# Patient Record
Sex: Female | Born: 1945 | Race: White | Hispanic: No | Smoking: Former smoker
Health system: Southern US, Community
[De-identification: ages and names within clinical notes are randomized; demographics above are authoritative.]

## PROBLEM LIST (undated history)

## (undated) DIAGNOSIS — I10 Essential (primary) hypertension: Secondary | ICD-10-CM

## (undated) DIAGNOSIS — Z972 Presence of dental prosthetic device (complete) (partial): Secondary | ICD-10-CM

## (undated) DIAGNOSIS — Z9989 Dependence on other enabling machines and devices: Secondary | ICD-10-CM

## (undated) DIAGNOSIS — R591 Generalized enlarged lymph nodes: Secondary | ICD-10-CM

## (undated) DIAGNOSIS — Z87442 Personal history of urinary calculi: Secondary | ICD-10-CM

## (undated) DIAGNOSIS — E890 Postprocedural hypothyroidism: Secondary | ICD-10-CM

## (undated) DIAGNOSIS — I2510 Atherosclerotic heart disease of native coronary artery without angina pectoris: Secondary | ICD-10-CM

## (undated) DIAGNOSIS — Z8619 Personal history of other infectious and parasitic diseases: Secondary | ICD-10-CM

## (undated) DIAGNOSIS — Z9621 Cochlear implant status: Secondary | ICD-10-CM

## (undated) DIAGNOSIS — H409 Unspecified glaucoma: Secondary | ICD-10-CM

## (undated) DIAGNOSIS — H269 Unspecified cataract: Secondary | ICD-10-CM

## (undated) DIAGNOSIS — M5137 Other intervertebral disc degeneration, lumbosacral region: Secondary | ICD-10-CM

## (undated) DIAGNOSIS — R159 Full incontinence of feces: Secondary | ICD-10-CM

## (undated) DIAGNOSIS — E782 Mixed hyperlipidemia: Secondary | ICD-10-CM

## (undated) DIAGNOSIS — R911 Solitary pulmonary nodule: Secondary | ICD-10-CM

## (undated) DIAGNOSIS — H903 Sensorineural hearing loss, bilateral: Secondary | ICD-10-CM

## (undated) DIAGNOSIS — K219 Gastro-esophageal reflux disease without esophagitis: Secondary | ICD-10-CM

## (undated) DIAGNOSIS — H8110 Benign paroxysmal vertigo, unspecified ear: Secondary | ICD-10-CM

## (undated) DIAGNOSIS — I7121 Aneurysm of the ascending aorta, without rupture: Secondary | ICD-10-CM

## (undated) DIAGNOSIS — E785 Hyperlipidemia, unspecified: Secondary | ICD-10-CM

## (undated) DIAGNOSIS — K760 Fatty (change of) liver, not elsewhere classified: Secondary | ICD-10-CM

## (undated) DIAGNOSIS — H8109 Meniere's disease, unspecified ear: Secondary | ICD-10-CM

## (undated) DIAGNOSIS — Z973 Presence of spectacles and contact lenses: Secondary | ICD-10-CM

## (undated) DIAGNOSIS — N329 Bladder disorder, unspecified: Secondary | ICD-10-CM

## (undated) DIAGNOSIS — J439 Emphysema, unspecified: Secondary | ICD-10-CM

## (undated) DIAGNOSIS — A048 Other specified bacterial intestinal infections: Secondary | ICD-10-CM

## (undated) DIAGNOSIS — K921 Melena: Secondary | ICD-10-CM

## (undated) DIAGNOSIS — R32 Unspecified urinary incontinence: Secondary | ICD-10-CM

## (undated) DIAGNOSIS — G4730 Sleep apnea, unspecified: Secondary | ICD-10-CM

## (undated) DIAGNOSIS — I712 Thoracic aortic aneurysm, without rupture: Secondary | ICD-10-CM

## (undated) DIAGNOSIS — Z86010 Personal history of colonic polyps: Secondary | ICD-10-CM

## (undated) DIAGNOSIS — Z860101 Personal history of adenomatous and serrated colon polyps: Secondary | ICD-10-CM

## (undated) DIAGNOSIS — K449 Diaphragmatic hernia without obstruction or gangrene: Secondary | ICD-10-CM

## (undated) DIAGNOSIS — N3281 Overactive bladder: Secondary | ICD-10-CM

## (undated) DIAGNOSIS — Z8719 Personal history of other diseases of the digestive system: Secondary | ICD-10-CM

## (undated) DIAGNOSIS — G4733 Obstructive sleep apnea (adult) (pediatric): Secondary | ICD-10-CM

## (undated) DIAGNOSIS — M51379 Other intervertebral disc degeneration, lumbosacral region without mention of lumbar back pain or lower extremity pain: Secondary | ICD-10-CM

## (undated) DIAGNOSIS — R0602 Shortness of breath: Secondary | ICD-10-CM

## (undated) DIAGNOSIS — N200 Calculus of kidney: Secondary | ICD-10-CM

## (undated) DIAGNOSIS — A6000 Herpesviral infection of urogenital system, unspecified: Secondary | ICD-10-CM

## (undated) DIAGNOSIS — K5792 Diverticulitis of intestine, part unspecified, without perforation or abscess without bleeding: Secondary | ICD-10-CM

## (undated) DIAGNOSIS — E039 Hypothyroidism, unspecified: Secondary | ICD-10-CM

## (undated) DIAGNOSIS — T7840XA Allergy, unspecified, initial encounter: Secondary | ICD-10-CM

## (undated) HISTORY — DX: Dependence on other enabling machines and devices: Z99.89

## (undated) HISTORY — DX: Unspecified glaucoma: H40.9

## (undated) HISTORY — DX: Other specified bacterial intestinal infections: A04.8

## (undated) HISTORY — DX: Gastro-esophageal reflux disease without esophagitis: K21.9

## (undated) HISTORY — PX: CATARACT EXTRACTION W/ INTRAOCULAR LENS IMPLANT: SHX1309

## (undated) HISTORY — DX: Thoracic aortic aneurysm, without rupture: I71.2

## (undated) HISTORY — PX: COCHLEAR IMPLANT: SUR684

## (undated) HISTORY — DX: Essential (primary) hypertension: I10

## (undated) HISTORY — DX: Fatty (change of) liver, not elsewhere classified: K76.0

## (undated) HISTORY — DX: Herpesviral infection of urogenital system, unspecified: A60.00

## (undated) HISTORY — DX: Hypothyroidism, unspecified: E03.9

## (undated) HISTORY — DX: Obstructive sleep apnea (adult) (pediatric): G47.33

## (undated) HISTORY — DX: Benign paroxysmal vertigo, unspecified ear: H81.10

## (undated) HISTORY — DX: Unspecified cataract: H26.9

## (undated) HISTORY — DX: Sleep apnea, unspecified: G47.30

## (undated) HISTORY — DX: Diverticulitis of intestine, part unspecified, without perforation or abscess without bleeding: K57.92

## (undated) HISTORY — PX: CATARACT EXTRACTION, BILATERAL: SHX1313

## (undated) HISTORY — PX: WISDOM TOOTH EXTRACTION: SHX21

## (undated) HISTORY — DX: Aneurysm of the ascending aorta, without rupture: I71.21

## (undated) HISTORY — DX: Meniere's disease, unspecified ear: H81.09

## (undated) HISTORY — DX: Unspecified urinary incontinence: R32

## (undated) HISTORY — DX: Melena: K92.1

## (undated) HISTORY — DX: Diaphragmatic hernia without obstruction or gangrene: K44.9

## (undated) HISTORY — PX: OTHER SURGICAL HISTORY: SHX169

## (undated) HISTORY — DX: Allergy, unspecified, initial encounter: T78.40XA

## (undated) HISTORY — DX: Solitary pulmonary nodule: R91.1

## (undated) HISTORY — DX: Hyperlipidemia, unspecified: E78.5

## (undated) HISTORY — DX: Calculus of kidney: N20.0

---

## 1987-12-30 HISTORY — PX: BREAST SURGERY: SHX581

## 1987-12-30 HISTORY — PX: REDUCTION MAMMAPLASTY: SUR839

## 1987-12-30 HISTORY — PX: APPENDECTOMY: SHX54

## 1987-12-30 HISTORY — PX: CHOLECYSTECTOMY: SHX55

## 1987-12-30 HISTORY — PX: CHOLECYSTECTOMY OPEN: SUR202

## 1993-12-29 HISTORY — PX: TOTAL THYROIDECTOMY: SHX2547

## 1993-12-29 HISTORY — PX: ABDOMINAL HYSTERECTOMY: SHX81

## 2010-05-28 DIAGNOSIS — K449 Diaphragmatic hernia without obstruction or gangrene: Secondary | ICD-10-CM

## 2010-05-28 DIAGNOSIS — K760 Fatty (change of) liver, not elsewhere classified: Secondary | ICD-10-CM

## 2010-05-28 HISTORY — DX: Diaphragmatic hernia without obstruction or gangrene: K44.9

## 2010-05-28 HISTORY — DX: Fatty (change of) liver, not elsewhere classified: K76.0

## 2010-12-29 HISTORY — PX: LAPAROSCOPIC SIGMOID COLECTOMY: SHX5928

## 2010-12-29 HISTORY — PX: COLONOSCOPY: SHX174

## 2011-02-27 HISTORY — PX: LAPAROSCOPIC SIGMOID COLECTOMY: SHX5928

## 2012-04-19 DIAGNOSIS — I079 Rheumatic tricuspid valve disease, unspecified: Secondary | ICD-10-CM | POA: Diagnosis not present

## 2012-04-19 DIAGNOSIS — I672 Cerebral atherosclerosis: Secondary | ICD-10-CM | POA: Diagnosis not present

## 2012-04-19 DIAGNOSIS — I051 Rheumatic mitral insufficiency: Secondary | ICD-10-CM | POA: Diagnosis not present

## 2012-04-19 DIAGNOSIS — I10 Essential (primary) hypertension: Secondary | ICD-10-CM | POA: Diagnosis not present

## 2012-04-19 DIAGNOSIS — E782 Mixed hyperlipidemia: Secondary | ICD-10-CM | POA: Diagnosis not present

## 2012-04-19 DIAGNOSIS — E039 Hypothyroidism, unspecified: Secondary | ICD-10-CM | POA: Diagnosis not present

## 2012-04-19 DIAGNOSIS — H8110 Benign paroxysmal vertigo, unspecified ear: Secondary | ICD-10-CM | POA: Diagnosis not present

## 2012-04-19 DIAGNOSIS — R9431 Abnormal electrocardiogram [ECG] [EKG]: Secondary | ICD-10-CM | POA: Diagnosis not present

## 2012-04-19 DIAGNOSIS — R42 Dizziness and giddiness: Secondary | ICD-10-CM | POA: Diagnosis not present

## 2012-04-26 DIAGNOSIS — R319 Hematuria, unspecified: Secondary | ICD-10-CM | POA: Diagnosis not present

## 2012-04-26 DIAGNOSIS — E7800 Pure hypercholesterolemia, unspecified: Secondary | ICD-10-CM | POA: Diagnosis not present

## 2012-04-26 DIAGNOSIS — R9431 Abnormal electrocardiogram [ECG] [EKG]: Secondary | ICD-10-CM | POA: Diagnosis not present

## 2012-04-26 DIAGNOSIS — I10 Essential (primary) hypertension: Secondary | ICD-10-CM | POA: Diagnosis not present

## 2012-04-26 DIAGNOSIS — I051 Rheumatic mitral insufficiency: Secondary | ICD-10-CM | POA: Diagnosis not present

## 2012-04-26 DIAGNOSIS — E039 Hypothyroidism, unspecified: Secondary | ICD-10-CM | POA: Diagnosis not present

## 2012-04-26 DIAGNOSIS — F17200 Nicotine dependence, unspecified, uncomplicated: Secondary | ICD-10-CM | POA: Diagnosis not present

## 2012-04-26 DIAGNOSIS — E782 Mixed hyperlipidemia: Secondary | ICD-10-CM | POA: Diagnosis not present

## 2012-04-28 DIAGNOSIS — I10 Essential (primary) hypertension: Secondary | ICD-10-CM | POA: Diagnosis not present

## 2012-04-28 DIAGNOSIS — E7800 Pure hypercholesterolemia, unspecified: Secondary | ICD-10-CM | POA: Diagnosis not present

## 2012-05-06 DIAGNOSIS — Z9689 Presence of other specified functional implants: Secondary | ICD-10-CM | POA: Diagnosis not present

## 2012-05-06 DIAGNOSIS — Z9089 Acquired absence of other organs: Secondary | ICD-10-CM | POA: Diagnosis not present

## 2012-05-06 DIAGNOSIS — Z9889 Other specified postprocedural states: Secondary | ICD-10-CM | POA: Diagnosis not present

## 2012-05-06 DIAGNOSIS — I10 Essential (primary) hypertension: Secondary | ICD-10-CM | POA: Diagnosis not present

## 2012-05-06 DIAGNOSIS — F17200 Nicotine dependence, unspecified, uncomplicated: Secondary | ICD-10-CM | POA: Diagnosis not present

## 2012-05-06 DIAGNOSIS — Z8249 Family history of ischemic heart disease and other diseases of the circulatory system: Secondary | ICD-10-CM | POA: Diagnosis not present

## 2012-05-06 DIAGNOSIS — R42 Dizziness and giddiness: Secondary | ICD-10-CM | POA: Diagnosis not present

## 2012-05-06 DIAGNOSIS — R112 Nausea with vomiting, unspecified: Secondary | ICD-10-CM | POA: Diagnosis not present

## 2012-05-06 DIAGNOSIS — H9209 Otalgia, unspecified ear: Secondary | ICD-10-CM | POA: Diagnosis not present

## 2012-05-10 DIAGNOSIS — H8109 Meniere's disease, unspecified ear: Secondary | ICD-10-CM | POA: Diagnosis not present

## 2012-05-10 DIAGNOSIS — R42 Dizziness and giddiness: Secondary | ICD-10-CM | POA: Diagnosis not present

## 2012-05-10 DIAGNOSIS — F17200 Nicotine dependence, unspecified, uncomplicated: Secondary | ICD-10-CM | POA: Diagnosis not present

## 2012-05-10 DIAGNOSIS — I10 Essential (primary) hypertension: Secondary | ICD-10-CM | POA: Diagnosis not present

## 2012-05-12 DIAGNOSIS — Z462 Encounter for fitting and adjustment of other devices related to nervous system and special senses: Secondary | ICD-10-CM | POA: Diagnosis not present

## 2012-05-17 DIAGNOSIS — Z1231 Encounter for screening mammogram for malignant neoplasm of breast: Secondary | ICD-10-CM | POA: Diagnosis not present

## 2012-05-17 DIAGNOSIS — Z13820 Encounter for screening for osteoporosis: Secondary | ICD-10-CM | POA: Diagnosis not present

## 2012-06-07 DIAGNOSIS — E782 Mixed hyperlipidemia: Secondary | ICD-10-CM | POA: Diagnosis not present

## 2012-06-07 DIAGNOSIS — R42 Dizziness and giddiness: Secondary | ICD-10-CM | POA: Diagnosis not present

## 2012-06-07 DIAGNOSIS — H903 Sensorineural hearing loss, bilateral: Secondary | ICD-10-CM | POA: Diagnosis not present

## 2012-06-07 DIAGNOSIS — H8110 Benign paroxysmal vertigo, unspecified ear: Secondary | ICD-10-CM | POA: Diagnosis not present

## 2012-06-07 DIAGNOSIS — F17200 Nicotine dependence, unspecified, uncomplicated: Secondary | ICD-10-CM | POA: Diagnosis not present

## 2012-06-07 DIAGNOSIS — H9319 Tinnitus, unspecified ear: Secondary | ICD-10-CM | POA: Diagnosis not present

## 2012-06-07 DIAGNOSIS — E039 Hypothyroidism, unspecified: Secondary | ICD-10-CM | POA: Diagnosis not present

## 2012-06-07 DIAGNOSIS — I10 Essential (primary) hypertension: Secondary | ICD-10-CM | POA: Diagnosis not present

## 2012-06-08 DIAGNOSIS — R51 Headache: Secondary | ICD-10-CM | POA: Diagnosis not present

## 2012-06-08 DIAGNOSIS — H814 Vertigo of central origin: Secondary | ICD-10-CM | POA: Diagnosis not present

## 2012-06-14 DIAGNOSIS — H814 Vertigo of central origin: Secondary | ICD-10-CM | POA: Diagnosis not present

## 2012-06-16 DIAGNOSIS — H903 Sensorineural hearing loss, bilateral: Secondary | ICD-10-CM | POA: Diagnosis not present

## 2012-08-02 DIAGNOSIS — E039 Hypothyroidism, unspecified: Secondary | ICD-10-CM | POA: Diagnosis not present

## 2012-08-02 DIAGNOSIS — R42 Dizziness and giddiness: Secondary | ICD-10-CM | POA: Diagnosis not present

## 2012-08-02 DIAGNOSIS — H8109 Meniere's disease, unspecified ear: Secondary | ICD-10-CM | POA: Diagnosis not present

## 2012-08-02 DIAGNOSIS — I10 Essential (primary) hypertension: Secondary | ICD-10-CM | POA: Diagnosis not present

## 2012-08-12 DIAGNOSIS — H903 Sensorineural hearing loss, bilateral: Secondary | ICD-10-CM | POA: Diagnosis not present

## 2012-08-16 DIAGNOSIS — H903 Sensorineural hearing loss, bilateral: Secondary | ICD-10-CM | POA: Diagnosis not present

## 2012-08-16 DIAGNOSIS — R42 Dizziness and giddiness: Secondary | ICD-10-CM | POA: Diagnosis not present

## 2012-09-21 DIAGNOSIS — E785 Hyperlipidemia, unspecified: Secondary | ICD-10-CM | POA: Diagnosis not present

## 2012-09-21 DIAGNOSIS — H918X9 Other specified hearing loss, unspecified ear: Secondary | ICD-10-CM | POA: Diagnosis not present

## 2012-09-21 DIAGNOSIS — R42 Dizziness and giddiness: Secondary | ICD-10-CM | POA: Diagnosis not present

## 2012-09-21 DIAGNOSIS — I69998 Other sequelae following unspecified cerebrovascular disease: Secondary | ICD-10-CM | POA: Diagnosis not present

## 2012-09-21 DIAGNOSIS — E039 Hypothyroidism, unspecified: Secondary | ICD-10-CM | POA: Diagnosis not present

## 2012-09-23 DIAGNOSIS — E785 Hyperlipidemia, unspecified: Secondary | ICD-10-CM | POA: Diagnosis not present

## 2012-09-23 DIAGNOSIS — E039 Hypothyroidism, unspecified: Secondary | ICD-10-CM | POA: Diagnosis not present

## 2012-09-23 DIAGNOSIS — R319 Hematuria, unspecified: Secondary | ICD-10-CM | POA: Diagnosis not present

## 2012-10-19 DIAGNOSIS — E039 Hypothyroidism, unspecified: Secondary | ICD-10-CM | POA: Diagnosis not present

## 2012-10-19 DIAGNOSIS — E785 Hyperlipidemia, unspecified: Secondary | ICD-10-CM | POA: Diagnosis not present

## 2012-10-19 DIAGNOSIS — I10 Essential (primary) hypertension: Secondary | ICD-10-CM | POA: Diagnosis not present

## 2012-10-19 DIAGNOSIS — R319 Hematuria, unspecified: Secondary | ICD-10-CM | POA: Diagnosis not present

## 2012-10-19 DIAGNOSIS — Z23 Encounter for immunization: Secondary | ICD-10-CM | POA: Diagnosis not present

## 2012-12-06 DIAGNOSIS — H4011X0 Primary open-angle glaucoma, stage unspecified: Secondary | ICD-10-CM | POA: Diagnosis not present

## 2012-12-06 DIAGNOSIS — H2510 Age-related nuclear cataract, unspecified eye: Secondary | ICD-10-CM | POA: Diagnosis not present

## 2012-12-07 DIAGNOSIS — E039 Hypothyroidism, unspecified: Secondary | ICD-10-CM | POA: Diagnosis not present

## 2012-12-13 DIAGNOSIS — H8110 Benign paroxysmal vertigo, unspecified ear: Secondary | ICD-10-CM | POA: Diagnosis not present

## 2012-12-13 DIAGNOSIS — H903 Sensorineural hearing loss, bilateral: Secondary | ICD-10-CM | POA: Diagnosis not present

## 2012-12-21 DIAGNOSIS — R109 Unspecified abdominal pain: Secondary | ICD-10-CM | POA: Diagnosis not present

## 2012-12-21 DIAGNOSIS — E785 Hyperlipidemia, unspecified: Secondary | ICD-10-CM | POA: Diagnosis not present

## 2012-12-21 DIAGNOSIS — I10 Essential (primary) hypertension: Secondary | ICD-10-CM | POA: Diagnosis not present

## 2012-12-21 DIAGNOSIS — E039 Hypothyroidism, unspecified: Secondary | ICD-10-CM | POA: Diagnosis not present

## 2013-02-10 DIAGNOSIS — H903 Sensorineural hearing loss, bilateral: Secondary | ICD-10-CM | POA: Diagnosis not present

## 2013-03-14 DIAGNOSIS — E039 Hypothyroidism, unspecified: Secondary | ICD-10-CM | POA: Diagnosis not present

## 2013-03-21 DIAGNOSIS — E039 Hypothyroidism, unspecified: Secondary | ICD-10-CM | POA: Diagnosis not present

## 2013-03-21 DIAGNOSIS — H918X9 Other specified hearing loss, unspecified ear: Secondary | ICD-10-CM | POA: Diagnosis not present

## 2013-03-21 DIAGNOSIS — I10 Essential (primary) hypertension: Secondary | ICD-10-CM | POA: Diagnosis not present

## 2013-03-21 DIAGNOSIS — E785 Hyperlipidemia, unspecified: Secondary | ICD-10-CM | POA: Diagnosis not present

## 2013-08-16 ENCOUNTER — Ambulatory Visit (INDEPENDENT_AMBULATORY_CARE_PROVIDER_SITE_OTHER): Payer: Medicare Other | Admitting: Internal Medicine

## 2013-08-16 ENCOUNTER — Encounter: Payer: Self-pay | Admitting: Internal Medicine

## 2013-08-16 VITALS — BP 104/64 | HR 92 | Temp 98.5°F | Resp 12 | Wt 130.0 lb

## 2013-08-16 DIAGNOSIS — R319 Hematuria, unspecified: Secondary | ICD-10-CM | POA: Diagnosis not present

## 2013-08-16 DIAGNOSIS — J309 Allergic rhinitis, unspecified: Secondary | ICD-10-CM | POA: Diagnosis not present

## 2013-08-16 DIAGNOSIS — Z1239 Encounter for other screening for malignant neoplasm of breast: Secondary | ICD-10-CM

## 2013-08-16 DIAGNOSIS — H409 Unspecified glaucoma: Secondary | ICD-10-CM | POA: Diagnosis not present

## 2013-08-16 DIAGNOSIS — E039 Hypothyroidism, unspecified: Secondary | ICD-10-CM | POA: Diagnosis not present

## 2013-08-16 DIAGNOSIS — I10 Essential (primary) hypertension: Secondary | ICD-10-CM | POA: Insufficient documentation

## 2013-08-16 MED ORDER — MOMETASONE FUROATE 50 MCG/ACT NA SUSP: 2.0000 | Freq: Every day | NASAL | Status: DC

## 2013-08-16 NOTE — Assessment & Plan Note (Signed)
Labs from [employer] reviewed Continue synthroid

## 2013-08-16 NOTE — Patient Instructions (Signed)

## 2013-08-16 NOTE — Progress Notes (Signed)
HPI  Pt presents to the clinic today to establish care. She recently moved from [place]. She needs new PCP. She does need a referral to urology for occult blood in her urine. She has cystocscopy every 3 years. She also needs a referral for a opthamologist for management of her glaucoma.  Flu: yearly Tetanus: Pneumovax: 2009 Zostovax: Pap Smear: total hysterectomy Mammogram: 2013 Bone Scan: 2013 Colonoscopy:  Due 2015 Eye Doctor: yearly Dentist: yearly   Past Medical History  Diagnosis Date  . Thyroid disease   . Hyperlipidemia   . Blood in stool   . Diverticulitis   . GERD (gastroesophageal reflux disease)   . Allergy     hay fever  . Hypertension   . Kidney stones   . Incontinence of urine in female     Current Outpatient Prescriptions  Medication Sig Dispense Refill  . ascorbic acid (VITAMIN C) 500 MG tablet Take 500 mg by mouth daily.      Marland Kitchen Cod Liver Oil 1000 MG CAPS Take 1 capsule by mouth daily.      Marland Kitchen docusate sodium (STOOL SOFTENER) 100 MG capsule Take 100 mg by mouth daily.      . Lactobacillus (ACIDOPHILUS PROBIOTIC) 10 MG TABS Take 1 capsule by mouth daily.      Marland Kitchen latanoprost (XALATAN) 0.005 % ophthalmic solution Place 1 drop into both eyes every morning.      Marland Kitchen levothyroxine (SYNTHROID) 125 MCG tablet Take 125 mcg by mouth daily before breakfast. BRAND NAME ONLY      . losartan-hydrochlorothiazide (HYZAAR) 100-25 MG per tablet Take 1 tablet by mouth daily.       No current facility-administered medications for this visit.    Allergies  Allergen Reactions  . Statins Other (See Comments)    Joint pain     Family History  Problem Relation Age of Onset  . Hypertension Mother   . Hypertension Father   . Hypertension Sister   . Cancer Brother     prostate cancer  . Hyperlipidemia Brother   . Cancer Maternal Aunt     colon  . Cancer Paternal Uncle     prostate    History   Social History  . Marital Status: Divorced    Spouse Name: N/A    Number of  Children: N/A  . Years of Education: N/A   Occupational History  . Not on file.   Social History Main Topics  . Smoking status: Current Every Day Smoker -- 0.50 packs/day  . Smokeless tobacco: Never Used  . Alcohol Use: Yes  . Drug Use: No  . Sexual Activity: No   Other Topics Concern  . Not on file   Social History Narrative   Regular exercise: walk; 1 mile a day   Caffeine use: 1 cup of coffee in the AM      3 children          ROS:  Constitutional: Denies fever, malaise, fatigue, headache or abrupt weight changes.  HEENT: Denies eye pain, eye redness, ear pain, ringing in the ears, wax buildup, runny nose, nasal congestion, bloody nose, or sore throat. Respiratory: Denies difficulty breathing, shortness of breath, cough or sputum production.   Cardiovascular: Denies chest pain, chest tightness, palpitations or swelling in the hands or feet.  Gastrointestinal: Pt reports constipation.Denies abdominal pain, bloating, diarrhea or blood in the stool.  GU: Denies frequency, urgency, pain with urination, blood in urine, odor or discharge. Musculoskeletal: Denies decrease in range of motion,  difficulty with gait, muscle pain or joint pain and swelling.  Skin: Denies redness, rashes, lesions or ulcercations.  Neurological: Denies dizziness, difficulty with memory, difficulty with speech or problems with balance and coordination.   No other specific complaints in a complete review of systems (except as listed in HPI above).  PE:  BP 104/64  Pulse 92  Temp(Src) 98.5 F (36.9 C) (Oral)  Resp 12  Wt 130 lb (58.968 kg)  SpO2 98% Wt Readings from Last 3 Encounters:  08/16/13 130 lb (58.968 kg)    General: Appears her stated age, well developed, well nourished in NAD. HEENT: Head: normal shape and size; Eyes: sclera white, no icterus, conjunctiva pink, PERRLA and EOMs intact; Ears: Tm's gray and intact, normal light reflex; Nose: mucosa pink and moist, septum midline;  Throat/Mouth: Teeth present, mucosa pink and moist, no lesions or ulcerations noted. Cochlear implants noted bilaterally.  Neck: Normal range of motion. Neck supple, trachea midline. No massses, lumps or thyromegaly present.  Cardiovascular: Normal rate and rhythm. S1,S2 noted.  No murmur, rubs or gallops noted. No JVD or BLE edema. No carotid bruits noted. Pulmonary/Chest: Normal effort and positive vesicular breath sounds. No respiratory distress. No wheezes, rales or ronchi noted.  Abdomen: Soft and nontender. Normal bowel sounds, no bruits noted. No distention or masses noted. Liver, spleen and kidneys non palpable. Musculoskeletal: Normal range of motion. No signs of joint swelling. No difficulty with gait.  Neurological: Alert and oriented. Cranial nerves II-XII intact. Coordination normal. +DTRs bilaterally. Psychiatric: Mood and affect normal. Behavior is normal. Judgment and thought content normal.      Assessment and Plan:  Prevent health:  Find out when your last tetanus was We will schedule your mammogram

## 2013-08-16 NOTE — Assessment & Plan Note (Signed)
Will refer to opthomology

## 2013-08-16 NOTE — Assessment & Plan Note (Signed)
Will refer to urology

## 2013-08-16 NOTE — Assessment & Plan Note (Signed)
Well controlled Continue current therapy 

## 2013-08-16 NOTE — Assessment & Plan Note (Signed)
nasonex refilled today

## 2013-08-17 ENCOUNTER — Encounter: Payer: Self-pay | Admitting: Internal Medicine

## 2013-09-01 ENCOUNTER — Telehealth: Payer: Self-pay | Admitting: Internal Medicine

## 2013-09-01 DIAGNOSIS — N3941 Urge incontinence: Secondary | ICD-10-CM | POA: Diagnosis not present

## 2013-09-01 DIAGNOSIS — R3129 Other microscopic hematuria: Secondary | ICD-10-CM | POA: Diagnosis not present

## 2013-09-01 NOTE — Telephone Encounter (Signed)
Pt states her son told us that she would contact her ophthalmologist to set up appt when was called about referral. Pt states she does not have one her in [place] and would like to go ahead with referral.

## 2013-09-14 ENCOUNTER — Ambulatory Visit (HOSPITAL_COMMUNITY)
Admission: RE | Admit: 2013-09-14 | Discharge: 2013-09-14 | Disposition: A | Discharge status: Home / Self Care | Payer: Medicare Other | Source: Ambulatory Visit | Attending: Internal Medicine | Admitting: Internal Medicine

## 2013-09-14 DIAGNOSIS — Z1231 Encounter for screening mammogram for malignant neoplasm of breast: Secondary | ICD-10-CM | POA: Diagnosis not present

## 2013-09-14 DIAGNOSIS — Z1239 Encounter for other screening for malignant neoplasm of breast: Secondary | ICD-10-CM

## 2013-09-19 DIAGNOSIS — H409 Unspecified glaucoma: Secondary | ICD-10-CM | POA: Diagnosis not present

## 2013-09-19 DIAGNOSIS — H4011X0 Primary open-angle glaucoma, stage unspecified: Secondary | ICD-10-CM | POA: Diagnosis not present

## 2013-09-19 DIAGNOSIS — H52209 Unspecified astigmatism, unspecified eye: Secondary | ICD-10-CM | POA: Diagnosis not present

## 2013-09-19 DIAGNOSIS — H2510 Age-related nuclear cataract, unspecified eye: Secondary | ICD-10-CM | POA: Diagnosis not present

## 2013-10-03 ENCOUNTER — Ambulatory Visit (INDEPENDENT_AMBULATORY_CARE_PROVIDER_SITE_OTHER): Payer: Medicare Other | Admitting: Internal Medicine

## 2013-10-03 ENCOUNTER — Encounter: Payer: Self-pay | Admitting: Internal Medicine

## 2013-10-03 VITALS — BP 120/72 | HR 82 | Temp 97.7°F | Ht 62.0 in | Wt 131.0 lb

## 2013-10-03 DIAGNOSIS — Z23 Encounter for immunization: Secondary | ICD-10-CM

## 2013-10-03 DIAGNOSIS — J309 Allergic rhinitis, unspecified: Secondary | ICD-10-CM

## 2013-10-03 DIAGNOSIS — E782 Mixed hyperlipidemia: Secondary | ICD-10-CM | POA: Insufficient documentation

## 2013-10-03 DIAGNOSIS — E785 Hyperlipidemia, unspecified: Secondary | ICD-10-CM | POA: Diagnosis not present

## 2013-10-03 DIAGNOSIS — I10 Essential (primary) hypertension: Secondary | ICD-10-CM | POA: Diagnosis not present

## 2013-10-03 DIAGNOSIS — E039 Hypothyroidism, unspecified: Secondary | ICD-10-CM | POA: Diagnosis not present

## 2013-10-03 MED ORDER — LOSARTAN POTASSIUM-HCTZ 100-25 MG PO TABS: 1.0000 | ORAL_TABLET | Freq: Every day | ORAL | Status: DC

## 2013-10-03 MED ORDER — LEVOTHYROXINE SODIUM 125 MCG PO TABS: 125.0000 ug | ORAL_TABLET | Freq: Every day | ORAL | Status: DC

## 2013-10-03 NOTE — Progress Notes (Signed)
Subjective:    Patient ID: Rachel Gardner, female    DOB: [birth year], 67 y.o.   MRN: [id number]  HPI  Pt presents to the clinic today with multiple concerns. 1- she wants to know the results of her mammogram. 2- she is not taking the nasonex because it is 160.00 and she can not afford it. She is using Nasocort instead. It seems to be working. 3- It has been  6 months since she had her TSH and lipids checked. She would like them checked today. She will also need refills of her synthroid and losartan-hctz.  Review of Systems      Past Medical History  Diagnosis Date  . Thyroid disease   . Hyperlipidemia   . Blood in stool   . Diverticulitis   . GERD (gastroesophageal reflux disease)   . Allergy     hay fever  . Hypertension   . Kidney stones   . Incontinence of urine in female     Current Outpatient Prescriptions  Medication Sig Dispense Refill  . ascorbic acid (VITAMIN C) 500 MG tablet Take 500 mg by mouth daily.      Marland Kitchen b complex vitamins tablet Take 1 tablet by mouth daily.      . Biotin 1 MG CAPS Take by mouth.      Marland Kitchen Cod Liver Oil 1000 MG CAPS Take 1 capsule by mouth daily.      Marland Kitchen docusate sodium (STOOL SOFTENER) 100 MG capsule Take 100 mg by mouth daily.      . Lactobacillus (ACIDOPHILUS PROBIOTIC) 10 MG TABS Take 1 capsule by mouth daily.      Marland Kitchen latanoprost (XALATAN) 0.005 % ophthalmic solution Place 1 drop into both eyes every morning.      Marland Kitchen levothyroxine (SYNTHROID) 125 MCG tablet Take 1 tablet (125 mcg total) by mouth daily before breakfast. BRAND NAME ONLY  30 tablet  2  . losartan-hydrochlorothiazide (HYZAAR) 100-25 MG per tablet Take 1 tablet by mouth daily.  30 tablet  2   No current facility-administered medications for this visit.    Allergies  Allergen Reactions  . Statins Other (See Comments)    Joint pain     Family History  Problem Relation Age of Onset  . Hypertension Mother   . Hypertension Father   . Hypertension Sister   . Cancer Brother    prostate cancer  . Hyperlipidemia Brother   . Cancer Maternal Aunt     colon  . Cancer Paternal Uncle     prostate    History   Social History  . Marital Status: Divorced    Spouse Name: N/A    Number of Children: N/A  . Years of Education: N/A   Occupational History  . Not on file.   Social History Main Topics  . Smoking status: Current Every Day Smoker -- 0.50 packs/day  . Smokeless tobacco: Never Used  . Alcohol Use: Yes  . Drug Use: No  . Sexual Activity: No   Other Topics Concern  . Not on file   Social History Narrative   Regular exercise: walk; 1 mile a day   Caffeine use: 1 cup of coffee in the AM      3 children           Constitutional: Denies fever, malaise, fatigue, headache or abrupt weight changes.  Respiratory: Denies difficulty breathing, shortness of breath, cough or sputum production.   Cardiovascular: Denies chest pain, chest tightness, palpitations or swelling in  the hands or feet.  Gastrointestinal: Denies abdominal pain, bloating, constipation, diarrhea or blood in the stool.  Neurological: Denies dizziness, difficulty with memory, difficulty with speech or problems with balance and coordination.   No other specific complaints in a complete review of systems (except as listed in HPI above).  Objective:   Physical Exam   BP 120/72  Pulse 82  Temp(Src) 97.7 F (36.5 C) (Oral)  Ht 5\' 2"  (1.575 m)  Wt 131 lb (59.421 kg)  BMI 23.95 kg/m2  SpO2 98% Wt Readings from Last 3 Encounters:  10/03/13 131 lb (59.421 kg)  08/16/13 130 lb (58.968 kg)    General: Appears her stated age, well developed, well nourished in NAD.  Cardiovascular: Normal rate and rhythm. S1,S2 noted.  No murmur, rubs or gallops noted. No JVD or BLE edema. No carotid bruits noted. Pulmonary/Chest: Normal effort and positive vesicular breath sounds. No respiratory distress. No wheezes, rales or ronchi noted.  Abdomen: Soft and nontender. Normal bowel sounds, no bruits  noted. No distention or masses noted. Liver, spleen and kidneys non palpable.  Neurological: Alert and oriented. Cranial nerves II-XII intact. Coordination normal. +DTRs bilaterally.       Assessment & Plan:   Reviewed Mammogram results- Normal, repeat screening in 1-2 years

## 2013-10-03 NOTE — Assessment & Plan Note (Signed)
Not medicated Will check lipid profile today

## 2013-10-03 NOTE — Assessment & Plan Note (Signed)
Well controlled Will refill losartan-hctz today Will check cbc, cmet

## 2013-10-03 NOTE — Patient Instructions (Signed)
Mammogram A mammogram is an X-ray test to find changes in a woman's breast. You should get a mammogram if:  You are over 66 years of age.   You have risk factors.   Your doctor recommends that you have one.  BEFORE THE TEST  Do not schedule the test the week before your period, especially if your breasts are sore during this time.  On the day of your mammogram:  Wash your breasts and armpits well. After washing, do not put on any deodorant or talcum powder on until after your test.   Eat and drink as you usually do.   Take your medicines as usual.   If you are diabetic and take insulin, make sure you:   Eat before coming for your test.   Take your insulin as usual.   If you cannot keep your appointment, call before the appointment to cancel. Schedule another appointment.  TEST  You will need to undress from the waist up. You will put on a hospital gown.   Your breast will be put on the mammogram machine, and it will press firmly on your breast with a piece of plastic called a compression paddle. This will make your breast flatter so that the machine can X-ray all parts of your breast.   Both breasts will be X-rayed. Each breast will be X-rayed from above and from the side. An X-ray might need to be taken again if the picture is not good enough.   The mammogram will last about 15 to 30 minutes.  AFTER THE TEST Finding out the results of your test Ask when your test results will be ready. Make sure you get your test results. Document Released: 03/13/2009 Document Revised: 12/04/2011 Document Reviewed: 03/13/2009 [employer] Patient Information 2012 [employer]. 

## 2013-10-03 NOTE — Assessment & Plan Note (Signed)
Veramyst refilled today

## 2013-10-03 NOTE — Assessment & Plan Note (Signed)
Will check TSH and titrate synthroid accordingly and call into your pharmacy

## 2013-10-19 DIAGNOSIS — H903 Sensorineural hearing loss, bilateral: Secondary | ICD-10-CM | POA: Diagnosis not present

## 2013-10-19 DIAGNOSIS — H9319 Tinnitus, unspecified ear: Secondary | ICD-10-CM | POA: Diagnosis not present

## 2013-12-06 ENCOUNTER — Other Ambulatory Visit (INDEPENDENT_AMBULATORY_CARE_PROVIDER_SITE_OTHER): Payer: Medicare Other

## 2013-12-06 DIAGNOSIS — E785 Hyperlipidemia, unspecified: Secondary | ICD-10-CM | POA: Diagnosis not present

## 2013-12-06 DIAGNOSIS — E039 Hypothyroidism, unspecified: Secondary | ICD-10-CM | POA: Diagnosis not present

## 2013-12-06 LAB — TSH: TSH: 3.89 u[IU]/mL (ref 0.35–5.50)

## 2013-12-06 LAB — COMPREHENSIVE METABOLIC PANEL
Albumin: 4.1 g/dL (ref 3.5–5.2)
Alkaline Phosphatase: 64 U/L (ref 39–117)
CO2: 29 mEq/L (ref 19–32)
Calcium: 9.1 mg/dL (ref 8.4–10.5)
Chloride: 100 mEq/L (ref 96–112)
Creatinine, Ser: 0.9 mg/dL (ref 0.4–1.2)
GFR: 67.29 mL/min (ref >60.00)
Glucose, Bld: 90 mg/dL (ref 70–99)
Potassium: 3.5 mEq/L (ref 3.5–5.1)
Sodium: 136 mEq/L (ref 135–145)
Total Protein: 7.7 g/dL (ref 6.0–8.3)

## 2013-12-06 LAB — CBC
HCT: 44.2 % (ref 36.0–46.0)
MCV: 87.5 fl (ref 78.0–100.0)
RBC: 5.05 Mil/uL (ref 3.87–5.11)
WBC: 10.1 10*3/uL (ref 4.5–10.5)

## 2013-12-06 LAB — LIPID PANEL
Cholesterol: 252 mg/dL — ABNORMAL HIGH (ref 0–200)
Total CHOL/HDL Ratio: 6
VLDL: 43.6 mg/dL — ABNORMAL HIGH (ref 0.0–40.0)

## 2013-12-06 LAB — LDL CHOLESTEROL, DIRECT: Direct LDL: 172 mg/dL

## 2014-01-09 ENCOUNTER — Encounter: Payer: Self-pay | Admitting: Internal Medicine

## 2014-01-10 ENCOUNTER — Ambulatory Visit: Payer: Medicare Other | Admitting: Internal Medicine

## 2014-01-10 ENCOUNTER — Ambulatory Visit (INDEPENDENT_AMBULATORY_CARE_PROVIDER_SITE_OTHER): Payer: Medicare Other | Admitting: Family Medicine

## 2014-01-10 ENCOUNTER — Encounter: Payer: Self-pay | Admitting: Family Medicine

## 2014-01-10 VITALS — BP 130/72 | HR 80 | Temp 97.8°F | Resp 16 | Wt 128.1 lb

## 2014-01-10 DIAGNOSIS — K2990 Gastroduodenitis, unspecified, without bleeding: Principal | ICD-10-CM

## 2014-01-10 DIAGNOSIS — K2970 Gastritis, unspecified, without bleeding: Secondary | ICD-10-CM

## 2014-01-10 NOTE — Progress Notes (Signed)
SUBJECTIVE: Rachel Gardner is a 68 y.o. female who complains of epigastric pain for 48 hours. Patient does have a significant past medical history significant for colon resection secondary to diverticulitis as well as gallbladder removal and hysterectomy. Patient states that his pain was bad enough that it woke her up in the night. Patient was obtained the epigastric region and radiates towards her back. Patient states it seems to get worse with certain foods. Patient denies any significant change in bowel habits and denies any vomiting but does have some nausea. type symptoms. She has been experiencing belching and eructation, upper abdominal discomfort, symptoms primarily relate to meals, and lying down after meals for many hour(s), recurrent over time. ROS: patient denies chest pain, wheezing, weight loss, dysphagia, black stools, hematemesis, diarrhea, fever, shortness of breath, hemoptysis, coughing with swallowing, otaligia with swallowing or neck masses. Social history: no or minimal alcohol, nonsmoker, no ASA or NSAID's. Patient also past medical history significant for H. pylori but was treated multiple years ago.  Current Outpatient Prescriptions  Medication Sig Dispense Refill  . ascorbic acid (VITAMIN C) 500 MG tablet Take 500 mg by mouth daily.      Marland Kitchen b complex vitamins tablet Take 1 tablet by mouth daily.      . Biotin 1 MG CAPS Take by mouth.      Marland Kitchen Cod Liver Oil 1000 MG CAPS Take 1 capsule by mouth daily.      Marland Kitchen docusate sodium (STOOL SOFTENER) 100 MG capsule Take 100 mg by mouth daily.      . Lactobacillus (ACIDOPHILUS PROBIOTIC) 10 MG TABS Take 1 capsule by mouth daily.      Marland Kitchen latanoprost (XALATAN) 0.005 % ophthalmic solution Place 1 drop into both eyes every morning.      Marland Kitchen levothyroxine (SYNTHROID) 125 MCG tablet Take 1 tablet (125 mcg total) by mouth daily before breakfast. BRAND NAME ONLY  30 tablet  2  . losartan-hydrochlorothiazide (HYZAAR) 100-25 MG per tablet Take 1 tablet by  mouth daily.  30 tablet  2   No current facility-administered medications for this visit.    OBJECTIVE: Blood pressure 130/72, pulse 80, temperature 97.8 F (36.6 C), temperature source Oral, resp. rate 16, weight 128 lb 1.9 oz (58.115 kg), SpO2 98.00%.  Appears well, alert and oriented x 3, pleasant cooperative in NAD. Anicteric. Vitals as noted. Neck free of lymphadenopathy or mass. Abdomen - abdominal guarding is present. no CVA tenderness. no hernia noted. no inguinal nodes. no rebound tenderness. Bowel sounds positive in all 4 quadrants Overlying skin does show incisions are well healed from prior surgeries Cardiovascular: Regular rate and rhythm with no appreciated murmur Pulmonary: Clear to auscultation bilaterally Skin: No rash noted.. Extremities: No swelling, neurovascularly intact in all extremities with full range of motion and strength.  ASSESSMENT: Questionable ulcer  PLAN: The pathophysiology of reflux is discussed.  Anti-reflux measures such as raising the head of the bed, avoiding tight clothing or belts, avoiding eating late at night and not lying down shortly after mealtime and achieving weight loss are discussed. Avoid ASA, NSAID's, caffeine, peppermints, alcohol and tobacco. Patient was given a trial of Nexium for next 2 weeks. Further recommendations to her: . She should alert me if there are persistent symptoms, dysphagia, weight loss or GI bleeding. FUV is scheduled in 1-2 weeks.

## 2014-01-10 NOTE — Patient Instructions (Signed)
Very nice to meet you Start with a BRAT diet- Bananas, rice apples and toast.  If you can tolerate that then try other foods.  Take nexium daily with the samples/.  See your PCP in 1-2 weeks to make sure you are better.  If not we will coordinate a colonoscopy and endoscopy as well.  Gastritis, Adult Gastritis is soreness and swelling (inflammation) of the lining of the stomach. Gastritis can develop as a sudden onset (acute) or long-term (chronic) condition. If gastritis is not treated, it can lead to stomach bleeding and ulcers. CAUSES  Gastritis occurs when the stomach lining is weak or damaged. Digestive juices from the stomach then inflame the weakened stomach lining. The stomach lining may be weak or damaged due to viral or bacterial infections. One common bacterial infection is the Helicobacter pylori infection. Gastritis can also result from excessive alcohol consumption, taking certain medicines, or having too much acid in the stomach.  SYMPTOMS  In some cases, there are no symptoms. When symptoms are present, they may include:  Pain or a burning sensation in the upper abdomen.  Nausea.  Vomiting.  An uncomfortable feeling of fullness after eating. DIAGNOSIS  Your caregiver may suspect you have gastritis based on your symptoms and a physical exam. To determine the cause of your gastritis, your caregiver may perform the following:  Blood or stool tests to check for the H pylori bacterium.  Gastroscopy. A thin, flexible tube (endoscope) is passed down the esophagus and into the stomach. The endoscope has a light and camera on the end. Your caregiver uses the endoscope to view the inside of the stomach.  Taking a tissue sample (biopsy) from the stomach to examine under a microscope. TREATMENT  Depending on the cause of your gastritis, medicines may be prescribed. If you have a bacterial infection, such as an H pylori infection, antibiotics may be given. If your gastritis is caused by  too much acid in the stomach, H2 blockers or antacids may be given. Your caregiver may recommend that you stop taking aspirin, ibuprofen, or other nonsteroidal anti-inflammatory drugs (NSAIDs). HOME CARE INSTRUCTIONS  Only take over-the-counter or prescription medicines as directed by your caregiver.  If you were given antibiotic medicines, take them as directed. Finish them even if you start to feel better.  Drink enough fluids to keep your urine clear or pale yellow.  Avoid foods and drinks that make your symptoms worse, such as:  Caffeine or alcoholic drinks.  Chocolate.  Peppermint or mint flavorings.  Garlic and onions.  Spicy foods.  Citrus fruits, such as oranges, lemons, or limes.  Tomato-based foods such as sauce, chili, salsa, and pizza.  Fried and fatty foods.  Eat small, frequent meals instead of large meals. SEEK IMMEDIATE MEDICAL CARE IF:   You have black or dark red stools.  You vomit blood or material that looks like coffee grounds.  You are unable to keep fluids down.  Your abdominal pain gets worse.  You have a fever.  You do not feel better after 1 week.  You have any other questions or concerns. MAKE SURE YOU:  Understand these instructions.  Will watch your condition.  Will get help right away if you are not doing well or get worse. Document Released: 12/09/2001 Document Revised: 06/15/2012 Document Reviewed: 01/28/2012 [facility] Patient Information 2014 [employer].

## 2014-01-12 ENCOUNTER — Encounter: Payer: Self-pay | Admitting: Physician Assistant

## 2014-01-14 ENCOUNTER — Emergency Department (HOSPITAL_COMMUNITY)
Admission: EM | Admit: 2014-01-14 | Discharge: 2014-01-15 | Disposition: A | Discharge status: Home / Self Care | Payer: Medicare Other | Attending: Emergency Medicine | Admitting: Emergency Medicine

## 2014-01-14 ENCOUNTER — Encounter (HOSPITAL_COMMUNITY): Payer: Self-pay | Admitting: Emergency Medicine

## 2014-01-14 DIAGNOSIS — Z9889 Other specified postprocedural states: Secondary | ICD-10-CM | POA: Insufficient documentation

## 2014-01-14 DIAGNOSIS — R1013 Epigastric pain: Secondary | ICD-10-CM | POA: Diagnosis not present

## 2014-01-14 DIAGNOSIS — K219 Gastro-esophageal reflux disease without esophagitis: Secondary | ICD-10-CM | POA: Insufficient documentation

## 2014-01-14 DIAGNOSIS — K3189 Other diseases of stomach and duodenum: Secondary | ICD-10-CM | POA: Insufficient documentation

## 2014-01-14 DIAGNOSIS — E079 Disorder of thyroid, unspecified: Secondary | ICD-10-CM | POA: Insufficient documentation

## 2014-01-14 DIAGNOSIS — Z79899 Other long term (current) drug therapy: Secondary | ICD-10-CM | POA: Insufficient documentation

## 2014-01-14 DIAGNOSIS — F17200 Nicotine dependence, unspecified, uncomplicated: Secondary | ICD-10-CM | POA: Diagnosis not present

## 2014-01-14 DIAGNOSIS — Z8639 Personal history of other endocrine, nutritional and metabolic disease: Secondary | ICD-10-CM | POA: Insufficient documentation

## 2014-01-14 DIAGNOSIS — Z862 Personal history of diseases of the blood and blood-forming organs and certain disorders involving the immune mechanism: Secondary | ICD-10-CM | POA: Insufficient documentation

## 2014-01-14 DIAGNOSIS — R079 Chest pain, unspecified: Secondary | ICD-10-CM | POA: Diagnosis not present

## 2014-01-14 DIAGNOSIS — I10 Essential (primary) hypertension: Secondary | ICD-10-CM | POA: Insufficient documentation

## 2014-01-14 DIAGNOSIS — Z87442 Personal history of urinary calculi: Secondary | ICD-10-CM | POA: Diagnosis not present

## 2014-01-14 LAB — COMPREHENSIVE METABOLIC PANEL
ALT: 15 U/L (ref 0–35)
AST: 17 U/L (ref 0–37)
Albumin: 3.8 g/dL (ref 3.5–5.2)
Alkaline Phosphatase: 75 U/L (ref 39–117)
BUN: 24 mg/dL — ABNORMAL HIGH (ref 6–23)
CALCIUM: 9.4 mg/dL (ref 8.4–10.5)
CO2: 25 mEq/L (ref 19–32)
Chloride: 101 mEq/L (ref 96–112)
Creatinine, Ser: 0.8 mg/dL (ref 0.50–1.10)
GFR calc non Af Amer: 75 mL/min — ABNORMAL LOW (ref >90)
GFR, EST AFRICAN AMERICAN: 87 mL/min — AB (ref >90)
Glucose, Bld: 90 mg/dL (ref 70–99)
Potassium: 3.7 mEq/L (ref 3.7–5.3)
Sodium: 142 mEq/L (ref 137–147)
TOTAL PROTEIN: 7.4 g/dL (ref 6.0–8.3)
Total Bilirubin: 0.3 mg/dL (ref 0.3–1.2)

## 2014-01-14 LAB — CBC WITH DIFFERENTIAL/PLATELET
BASOS ABS: 0 10*3/uL (ref 0.0–0.1)
Basophils Relative: 0 % (ref 0–1)
EOS ABS: 0.2 10*3/uL (ref 0.0–0.7)
EOS PCT: 1 % (ref 0–5)
HCT: 39.3 % (ref 36.0–46.0)
Hemoglobin: 13.6 g/dL (ref 12.0–15.0)
Lymphocytes Relative: 38 % (ref 12–46)
Lymphs Abs: 4.1 10*3/uL — ABNORMAL HIGH (ref 0.7–4.0)
MCH: 30.3 pg (ref 26.0–34.0)
MCHC: 34.6 g/dL (ref 30.0–36.0)
MCV: 87.5 fL (ref 78.0–100.0)
Monocytes Absolute: 1 10*3/uL (ref 0.1–1.0)
Monocytes Relative: 9 % (ref 3–12)
Neutro Abs: 5.5 10*3/uL (ref 1.7–7.7)
Neutrophils Relative %: 51 % (ref 43–77)
PLATELETS: 187 10*3/uL (ref 150–400)
RBC: 4.49 MIL/uL (ref 3.87–5.11)
RDW: 12.4 % (ref 11.5–15.5)
WBC: 10.7 10*3/uL — ABNORMAL HIGH (ref 4.0–10.5)

## 2014-01-14 LAB — LIPASE, BLOOD: Lipase: 27 U/L (ref 11–59)

## 2014-01-14 LAB — TROPONIN I

## 2014-01-14 NOTE — ED Notes (Signed)
Patient presents with c/o chest and abd pain.  Pain radiates through to her back.  Ate 3 meatballs this evening and the pain started again  Has been treated by her PMD

## 2014-01-15 ENCOUNTER — Emergency Department (HOSPITAL_COMMUNITY): Payer: Medicare Other

## 2014-01-15 DIAGNOSIS — R1013 Epigastric pain: Secondary | ICD-10-CM | POA: Diagnosis not present

## 2014-01-15 MED ORDER — SUCRALFATE 1 G PO TABS: 1.0000 g | ORAL_TABLET | Freq: Four times a day (QID) | ORAL | Status: DC

## 2014-01-15 NOTE — ED Notes (Signed)
Patient transported to X-ray 

## 2014-01-15 NOTE — ED Notes (Signed)
Patient is alert and orientedx4.  Patient was explained discharge instructions and they understood them with no questions.  The patient's son, Parks Neptune is taking the patient home.

## 2014-01-15 NOTE — ED Provider Notes (Signed)
CSN: [id number]     Arrival date & time 01/14/14  2255 History   First MD Initiated Contact with Patient 01/15/14 0019     Chief Complaint  Patient presents with  . Abdominal Pain  . Chest Pain   (Consider location/radiation/quality/duration/timing/severity/associated sxs/prior Treatment) HPI  68 yo F presents with 1 week of epigastric pain. Pain is worse after eating. Patient saw her PCP this past Tuesday for evaluation of her pain. She was started on Nexium and asked to follow a BRAT diet.  She had persistent pain, called the PCP's office and was counseled to increase the frequency of Nexium to bid which she did.   Today, patient ended the BRAT diet and had scrambled eggs with sausage and port this morning followed by meatballs with parmesian cheese at lunch. After both meals she had aching, burning epigastric pain radiating into the chest. Pain has resolved without intervention. It lasted about 45-69m  Past Medical History  Diagnosis Date  . Thyroid disease   . Hyperlipidemia   . Blood in stool   . Diverticulitis   . GERD (gastroesophageal reflux disease)   . Allergy     hay fever  . Hypertension   . Kidney stones   . Incontinence of urine in female    Past Surgical History  Procedure Laterality Date  . Cholecystectomy  1989  . Appendectomy  1989  . Abdominal hysterectomy  1995  . Total thyroidectomy  1995  . Breast surgery  1989    breast reduction  . 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 Bowel resection  2012  . Bppv      x 4 after L cochlear implant   Family History  Problem Relation Age of Onset  . Hypertension Mother   . Hypertension Father   . Hypertension Sister   . Cancer Brother     prostate cancer  . Hyperlipidemia Brother   . Cancer Maternal Aunt     colon  . Cancer Paternal Uncle     prostate   History  Substance Use Topics  . Smoking status: Current Every Day Smoker -- 0.50 packs/day  . Smokeless tobacco: Never Used  . Alcohol Use:  Yes   OB History   Grav Para Term Preterm Abortions TAB SAB Ect Mult Living                 Review of Systems Ten point review of symptoms performed and is negative with the exception of symptoms noted above.   Allergies  Statins  Home Medications   Current Outpatient Rx  Name  Route  Sig  Dispense  Refill  . ascorbic acid (VITAMIN C) 500 MG tablet   Oral   Take 500 mg by mouth daily.         Marland Kitchen b complex vitamins tablet   Oral   Take 1 tablet by mouth daily.         . Biotin 1 MG CAPS   Oral   Take by mouth.         Marland Kitchen Cod Liver Oil 1000 MG CAPS   Oral   Take 1 capsule by mouth daily.         Marland Kitchen docusate sodium (STOOL SOFTENER) 100 MG capsule   Oral   Take 100 mg by mouth daily.         . Esomeprazole Magnesium (NEXIUM PO)   Oral   Take 1 tablet by mouth 2 (two) times  daily.         . Lactobacillus (ACIDOPHILUS PROBIOTIC) 10 MG TABS   Oral   Take 1 capsule by mouth daily.         Marland Kitchen latanoprost (XALATAN) 0.005 % ophthalmic solution   Both Eyes   Place 1 drop into both eyes at bedtime.          Marland Kitchen levothyroxine (SYNTHROID) 125 MCG tablet   Oral   Take 1 tablet (125 mcg total) by mouth daily before breakfast. BRAND NAME ONLY   30 tablet   2   . losartan-hydrochlorothiazide (HYZAAR) 100-25 MG per tablet   Oral   Take 1 tablet by mouth daily.   30 tablet   2    BP 142/91  Pulse 79  Temp(Src) 97.9 F (36.6 C) (Oral)  Resp 18  SpO2 96% Physical Exam Gen: well developed and well nourished appearing Head: NCAT Eyes: PERL, EOMI Nose: no epistaixis or rhinorrhea Mouth/throat: mucosa is moist and pink Neck: supple, no stridor Lungs: CTA B, no wheezing, rhonchi or rales CV: RRR, no murmur, extremities appear well perfused.  Abd: soft, notender, nondistended Back: no ttp, no cva ttp Skin: warm and dry Ext: normal to inspection, no dependent edema Neuro: CN ii-xii grossly intact, no focal deficits Psyche; normal affect,  calm and  cooperative.   ED Course  Procedures (including critical care time) Labs Review  Results for orders placed during the hospital encounter of 01/14/14 (from the past 24 hour(s))  CBC WITH DIFFERENTIAL     Status: Abnormal   Collection Time    01/14/14 11:15 PM      Result Value Range   WBC 10.7 (*) 4.0 - 10.5 K/uL   RBC 4.49  3.87 - 5.11 MIL/uL   Hemoglobin 13.6  12.0 - 15.0 g/dL   HCT 39.3  36.0 - 46.0 %   MCV 87.5  78.0 - 100.0 fL   MCH 30.3  26.0 - 34.0 pg   MCHC 34.6  30.0 - 36.0 g/dL   RDW 12.4  11.5 - 15.5 %   Platelets 187  150 - 400 K/uL   Neutrophils Relative % 51  43 - 77 %   Neutro Abs 5.5  1.7 - 7.7 K/uL   Lymphocytes Relative 38  12 - 46 %   Lymphs Abs 4.1 (*) 0.7 - 4.0 K/uL   Monocytes Relative 9  3 - 12 %   Monocytes Absolute 1.0  0.1 - 1.0 K/uL   Eosinophils Relative 1  0 - 5 %   Eosinophils Absolute 0.2  0.0 - 0.7 K/uL   Basophils Relative 0  0 - 1 %   Basophils Absolute 0.0  0.0 - 0.1 K/uL  COMPREHENSIVE METABOLIC PANEL     Status: Abnormal   Collection Time    01/14/14 11:15 PM      Result Value Range   Sodium 142  137 - 147 mEq/L   Potassium 3.7  3.7 - 5.3 mEq/L   Chloride 101  96 - 112 mEq/L   CO2 25  19 - 32 mEq/L   Glucose, Bld 90  70 - 99 mg/dL   BUN 24 (*) 6 - 23 mg/dL   Creatinine, Ser 0.80  0.50 - 1.10 mg/dL   Calcium 9.4  8.4 - 10.5 mg/dL   Total Protein 7.4  6.0 - 8.3 g/dL   Albumin 3.8  3.5 - 5.2 g/dL   AST 17  0 - 37 U/L   ALT 15  0 - 35 U/L   Alkaline Phosphatase 75  39 - 117 U/L   Total Bilirubin 0.3  0.3 - 1.2 mg/dL   GFR calc non Af Amer 75 (*) >90 mL/min   GFR calc Af Amer 87 (*) >90 mL/min  LIPASE, BLOOD     Status: None   Collection Time    01/14/14 11:15 PM      Result Value Range   Lipase 27  11 - 59 U/L  TROPONIN I     Status: None   Collection Time    01/14/14 11:15 PM      Result Value Range   Troponin I <0.30  <0.30 ng/mL   EKG: nsr, no acute ischemic changes, normal intervals, normal axis, normal qrs  complex  MDM  Patient feeling better. CMP, lipase wnl. Troponin undetectable and EKG wnl. Patient is stable for discharge. I have recommended addition of Carafate to medical regimen and backing off on heavy foods. Patient to f/u with PCP on Monday. Also given office information for [facility]. Counseled re: return precautions.     Elyn Peers, MD 01/15/14 [phone]

## 2014-01-17 ENCOUNTER — Encounter: Payer: Self-pay | Admitting: Physician Assistant

## 2014-01-18 ENCOUNTER — Encounter: Payer: Self-pay | Admitting: Physician Assistant

## 2014-01-18 ENCOUNTER — Ambulatory Visit (INDEPENDENT_AMBULATORY_CARE_PROVIDER_SITE_OTHER): Payer: Medicare Other | Admitting: Physician Assistant

## 2014-01-18 VITALS — BP 122/70 | HR 76 | Temp 98.0°F | Resp 20 | Ht 62.0 in | Wt 127.0 lb

## 2014-01-18 DIAGNOSIS — E785 Hyperlipidemia, unspecified: Secondary | ICD-10-CM

## 2014-01-18 DIAGNOSIS — R1013 Epigastric pain: Secondary | ICD-10-CM | POA: Diagnosis not present

## 2014-01-18 DIAGNOSIS — A048 Other specified bacterial intestinal infections: Secondary | ICD-10-CM

## 2014-01-18 NOTE — Progress Notes (Signed)
Pre-visit discussion using our clinic review tool. No additional management support is needed unless otherwise documented below in the visit note.  

## 2014-01-18 NOTE — Patient Instructions (Signed)
It was nice to meet you today Ms. Kohut!  I have ordered the referral to gastroenterology for further evaluation of your epigastric pain. Continue the Nexium one tab twice daily.  Gastroesophageal Reflux Disease, Adult Gastroesophageal reflux disease (GERD) happens when acid from your stomach flows up into the esophagus. When acid comes in contact with the esophagus, the acid causes soreness (inflammation) in the esophagus. Over time, GERD may create small holes (ulcers) in the lining of the esophagus. CAUSES   Increased body weight. This puts pressure on the stomach, making acid rise from the stomach into the esophagus.  Smoking. This increases acid production in the stomach.  Drinking alcohol. This causes decreased pressure in the lower esophageal sphincter (valve or ring of muscle between the esophagus and stomach), allowing acid from the stomach into the esophagus.  Late evening meals and a full stomach. This increases pressure and acid production in the stomach.  A malformed lower esophageal sphincter. Sometimes, no cause is found. SYMPTOMS   Burning pain in the lower part of the mid-chest behind the breastbone and in the mid-stomach area. This may occur twice a week or more often.  Trouble swallowing.  Sore throat.  Dry cough.  Asthma-like symptoms including chest tightness, shortness of breath, or wheezing. DIAGNOSIS  Your caregiver may be able to diagnose GERD based on your symptoms. In some cases, X-rays and other tests may be done to check for complications or to check the condition of your stomach and esophagus. TREATMENT  Your caregiver may recommend over-the-counter or prescription medicines to help decrease acid production. Ask your caregiver before starting or adding any new medicines.  HOME CARE INSTRUCTIONS   Change the factors that you can control. Ask your caregiver for guidance concerning weight loss, quitting smoking, and alcohol consumption.  Avoid foods  and drinks that make your symptoms worse, such as:  Caffeine or alcoholic drinks.  Chocolate.  Peppermint or mint flavorings.  Garlic and onions.  Spicy foods.  Citrus fruits, such as oranges, lemons, or limes.  Tomato-based foods such as sauce, chili, salsa, and pizza.  Fried and fatty foods.  Avoid lying down for the 3 hours prior to your bedtime or prior to taking a nap.  Eat small, frequent meals instead of large meals.  Wear loose-fitting clothing. Do not wear anything tight around your waist that causes pressure on your stomach.  Raise the head of your bed 6 to 8 inches with wood blocks to help you sleep. Extra pillows will not help.  Only take over-the-counter or prescription medicines for pain, discomfort, or fever as directed by your caregiver.  Do not take aspirin, ibuprofen, or other nonsteroidal anti-inflammatory drugs (NSAIDs). SEEK IMMEDIATE MEDICAL CARE IF:   You have pain in your arms, neck, jaw, teeth, or back.  Your pain increases or changes in intensity or duration.  You develop nausea, vomiting, or sweating (diaphoresis).  You develop shortness of breath, or you faint.  Your vomit is green, yellow, black, or looks like coffee grounds or blood.  Your stool is red, bloody, or black. These symptoms could be signs of other problems, such as heart disease, gastric bleeding, or esophageal bleeding. MAKE SURE YOU:   Understand these instructions.  Will watch your condition.  Will get help right away if you are not doing well or get worse. Document Released: 09/24/2005 Document Revised: 03/08/2012 Document Reviewed: 07/04/2011 [facility] Patient Information 2014 [place], [place].

## 2014-01-18 NOTE — Progress Notes (Signed)
Subjective:    Patient ID: Rachel Gardner, female    DOB: 10-11-1946, 68 y.o.   MRN: [id number]  HPI Comments: Patient is a 68 year old female who presents to the office for follow up of upper abdominal pain. Patient reports she has been experiencing the pain for the last two weeks. Also experiencing frequent burping with an intermittent burning sensation behind her sternum. Report she was seen at this office for the same complaint 10 days PTA, placed on Nexium 20 mg one tab twice daily and a brat diet. Patient reports her pain has not resolved. Thinks she has had minimal relief from her symptoms but not resolved. Patient reports the pain is just below her right ribs. Is mostly constant and increases after eating a meal. States she has had no improvement with the BRAT diet changes. Denies vomiting, fatigue, blood in stool, dark tarry stool, ASA or NSAID use. History significant for smoking and past H. Pylori.    Review of Systems  Constitutional: Negative for activity change and fatigue.  Respiratory: Negative for cough and shortness of breath.   Cardiovascular: Negative for chest pain and palpitations.  Gastrointestinal: Positive for nausea (intermittent, when having pain) and abdominal pain. Negative for vomiting, diarrhea, constipation, blood in stool and anal bleeding.  Skin: Negative for rash.  Neurological: Negative for weakness, light-headedness and headaches.  All other systems reviewed and are negative.      Objective:   Physical Exam  Vitals reviewed. Constitutional: She is oriented to person, place, and time. She appears well-developed and well-nourished. No distress.  HENT:  Head: Normocephalic and atraumatic.  Eyes: Conjunctivae are normal.  Neck: Normal range of motion.  Cardiovascular: Normal rate, regular rhythm and intact distal pulses.  Exam reveals no gallop and no friction rub.   No murmur heard. Pulmonary/Chest: Effort normal and breath sounds normal.  Abdominal:  Soft. Normal appearance and bowel sounds are normal. There is tenderness in the epigastric area.  Bowel sounds present in all quadrants. Pain is reproducible with palpation to epigastric region. Area is without erythema or rash.   Neurological: She is alert and oriented to person, place, and time.  Skin: Skin is warm and dry. She is not diaphoretic.  Psychiatric: She has a normal mood and affect.   Past Medical History  Diagnosis Date  . Thyroid disease   . Hyperlipidemia   . Blood in stool   . Diverticulitis   . GERD (gastroesophageal reflux disease)   . Allergy     hay fever  . Hypertension   . Kidney stones   . Incontinence of urine in female     Lab Results  Component Value Date   WBC 10.7* 01/14/2014   HGB 13.6 01/14/2014   HCT 39.3 01/14/2014   PLT 187 01/14/2014   GLUCOSE 90 01/14/2014   CHOL 252* 12/06/2013   TRIG 218.0* 12/06/2013   HDL 42.90 12/06/2013   LDLDIRECT 172.0 12/06/2013   ALT 15 01/14/2014   AST 17 01/14/2014   NA 142 01/14/2014   K 3.7 01/14/2014   CL 101 01/14/2014   CREATININE 0.80 01/14/2014   BUN 24* 01/14/2014   CO2 25 01/14/2014   TSH 3.89 12/06/2013     Assessment & Plan:    Epigastric pain: Pain is not related to exertion, pain reproducible with palpation.  I believe low risk for cardiac causes. Patient provided an additional 2 week supply of Nexium 20 mg tab, 2 tabs daily. Patient counseled on reflux  reduction to include smoking and caffeine cessation, small frequent meals, reduced spicy/greasy foods, elevate bed and to not go to bed on full stomach.   Referral to gastroenterology.  Dyslipidemia: Discussed past lipid results with patient. States not her usual lipid levels, last labs were done just after patient returned from month long trip to [place] where she was eating large amount of rich food. Patient to resume normal healthy eating and exercise, will evaluate lipids in two months time, possible to initiate medication.  Patient stated  understanding and agreement with treatment plan.

## 2014-01-20 ENCOUNTER — Encounter: Payer: Self-pay | Admitting: Gastroenterology

## 2014-01-24 ENCOUNTER — Ambulatory Visit (INDEPENDENT_AMBULATORY_CARE_PROVIDER_SITE_OTHER): Payer: Medicare Other | Admitting: Internal Medicine

## 2014-01-24 ENCOUNTER — Ambulatory Visit: Payer: Medicare Other | Admitting: Gastroenterology

## 2014-01-24 ENCOUNTER — Other Ambulatory Visit (INDEPENDENT_AMBULATORY_CARE_PROVIDER_SITE_OTHER): Payer: Medicare Other

## 2014-01-24 ENCOUNTER — Encounter: Payer: Self-pay | Admitting: *Deleted

## 2014-01-24 VITALS — BP 118/68 | HR 88 | Ht 62.0 in | Wt 126.8 lb

## 2014-01-24 DIAGNOSIS — R1013 Epigastric pain: Secondary | ICD-10-CM | POA: Diagnosis not present

## 2014-01-24 LAB — AMYLASE: Amylase: 81 U/L (ref 27–131)

## 2014-01-24 LAB — HEPATIC FUNCTION PANEL
ALT: 23 U/L (ref 0–35)
AST: 20 U/L (ref 0–37)
Albumin: 4 g/dL (ref 3.5–5.2)
Alkaline Phosphatase: 70 U/L (ref 39–117)
BILIRUBIN DIRECT: 0 mg/dL (ref 0.0–0.3)
BILIRUBIN TOTAL: 0.8 mg/dL (ref 0.3–1.2)
Total Protein: 7.4 g/dL (ref 6.0–8.3)

## 2014-01-24 LAB — LIPASE: LIPASE: 24 U/L (ref 11.0–59.0)

## 2014-01-24 NOTE — Patient Instructions (Signed)
You have been scheduled for an endoscopy with propofol. Please follow written instructions given to you at your visit today. If you use inhalers (even only as needed), please bring them with you on the day of your procedure. Your physician has requested that you go to www.startemmi.com and enter the access code given to you at your visit today. This web site gives a general overview about your procedure. However, you should still follow specific instructions given to you by our office regarding your preparation for the procedure.  Your physician has requested that you go to the basement for the following lab work before leaving today: Hepatic Function panel, Amylase, lipase  Take your Nexium (you already have this) 40 mg once daily.  You have been scheduled for an abdominal ultrasound at [facility] (1st floor of hospital) on Friday 01/27/14 at 8:00 am. Please arrive 15 minutes prior to your appointment for registration. Make certain not to have anything to eat or drink 6 hours prior to your appointment. Should you need to reschedule your appointment, please contact radiology at [phone]. This test typically takes about 30 minutes to perform.

## 2014-01-24 NOTE — Progress Notes (Signed)
Rachel Gardner 1946/11/02 [id number]  Note: This dictation was prepared with Dragon digital system. Any transcriptional errors that result from this procedure are unintentional.   History of Present Illness:  This is 68 year old [place] female, retired Marine scientist, who developed rather acute onset of severe epigastric and right costal margin abdominal pain on January 11 after eating a baked potato. Her pain radiated to the back. It was rather severe and lasted several hours. She was seen in the office the next day and at that time, her liver function tests were normal. She was started on Nexium 40 mg twice a day and she has been on it for at least a week. She subsequently reduced it to 1 Nexiem a day. The pain is better but still present. She has associated nausea but no fever. There has been no diarrhea or rectal bleeding. She has lost about 5 pounds since this occurrence. She does not drink alcohol or take any anti-inflammatory agents. She had a prior cholecystectomy in 1989 which was an incidental cholecystectomy without having any symptoms. She has benign positional vertigo. She was treated for H. pylori 1995. She had a colonoscopy in January 2012 and underwent a partial sigmoid resection elsewhere. She brought her records with her today, but due to the large volume, I have not had a chance to review them. There is a family history of colon cancer in a paternal uncle. She was told to have colonoscopy in 3 years.    Past Medical History  Diagnosis Date  . Thyroid disease   . Hyperlipidemia   . Blood in stool   . Diverticulitis   . GERD (gastroesophageal reflux disease)   . Allergy     hay fever  . Hypertension   . Kidney stones   . 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 Helicobacter pylori infection     Past Surgical History  Procedure Laterality Date  . Cholecystectomy  1989  . Appendectomy  1989  . Abdominal hysterectomy  1995  . Total thyroidectomy  1995  . Breast surgery  1989   breast reduction  . 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 Bowel resection  2012  . 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 Statins Other (See Comments)    Joint pain     Family history and social history have been reviewed.  Review of Systems: Upper abdominal pain radiating to the back resembles biliary colic   The remainder of the 10 point ROS is negative except as outlined in the H&P  Physical Exam: General Appearance Well developed, in no distress Eyes  Non icteric  HEENT  Non traumatic, normocephalic  Mouth No lesion, tongue papillated, no cheilosis Neck Supple without adenopathy, thyroid not enlarged, no carotid bruits, no JVD Lungs Clear to auscultation bilaterally COR Normal S1, normal S2, regular rhythm, no murmur, quiet precordium Abdomen very tender in epigastrium and along right costal margin. No rebound. Active bowel sounds. No distention. Lower abdomen unremarkable. Mild right CVA tenderness Rectal soft Hemoccult negative stool Extremities  No pedal edema Skin No lesions Neurological Alert and oriented x 3 Psychological Normal mood and affect  Assessment and Plan:   Problem #1 Acute abdominal pain which has persisted for the past 2 weeks. It is much better now but she is still unable to.eat There is a remote history of a cholecystectomy. Her symptoms are suggestive of  either biliary colic or peptic ulcer disease. We will proceed with an upper endoscopy. She will  take Nexium 40 mg daily. We will also schedule an upper abdominal ultrasound to assess her common bile duct and pancreas. In addition, we will check her amylase, lipase and metabolic panel.  Problem #2 Colorectal screening. She is due for a colonoscopy but we will postpone it until she is able to prep.    Delfin Edis 01/24/2014

## 2014-01-27 ENCOUNTER — Ambulatory Visit (HOSPITAL_COMMUNITY)
Admission: RE | Admit: 2014-01-27 | Discharge: 2014-01-27 | Disposition: A | Discharge status: Home / Self Care | Payer: Medicare Other | Source: Ambulatory Visit | Attending: Internal Medicine | Admitting: Internal Medicine

## 2014-01-27 DIAGNOSIS — R1013 Epigastric pain: Secondary | ICD-10-CM

## 2014-01-30 ENCOUNTER — Encounter: Payer: Self-pay | Admitting: *Deleted

## 2014-01-30 ENCOUNTER — Encounter: Payer: Self-pay | Admitting: Internal Medicine

## 2014-02-01 DIAGNOSIS — H4011X0 Primary open-angle glaucoma, stage unspecified: Secondary | ICD-10-CM | POA: Diagnosis not present

## 2014-02-01 DIAGNOSIS — H409 Unspecified glaucoma: Secondary | ICD-10-CM | POA: Diagnosis not present

## 2014-02-03 ENCOUNTER — Ambulatory Visit (AMBULATORY_SURGERY_CENTER): Payer: Medicare Other | Admitting: Internal Medicine

## 2014-02-03 ENCOUNTER — Encounter: Payer: Self-pay | Admitting: *Deleted

## 2014-02-03 ENCOUNTER — Encounter: Payer: Self-pay | Admitting: Internal Medicine

## 2014-02-03 VITALS — BP 137/79 | HR 60 | Temp 97.3°F | Resp 16 | Ht 62.0 in | Wt 126.0 lb

## 2014-02-03 DIAGNOSIS — D131 Benign neoplasm of stomach: Secondary | ICD-10-CM

## 2014-02-03 DIAGNOSIS — R1013 Epigastric pain: Secondary | ICD-10-CM | POA: Diagnosis not present

## 2014-02-03 DIAGNOSIS — K2960 Other gastritis without bleeding: Secondary | ICD-10-CM

## 2014-02-03 DIAGNOSIS — I10 Essential (primary) hypertension: Secondary | ICD-10-CM | POA: Diagnosis not present

## 2014-02-03 DIAGNOSIS — E039 Hypothyroidism, unspecified: Secondary | ICD-10-CM | POA: Diagnosis not present

## 2014-02-03 DIAGNOSIS — K219 Gastro-esophageal reflux disease without esophagitis: Secondary | ICD-10-CM

## 2014-02-03 DIAGNOSIS — E785 Hyperlipidemia, unspecified: Secondary | ICD-10-CM | POA: Diagnosis not present

## 2014-02-03 HISTORY — PX: ESOPHAGOGASTRODUODENOSCOPY: SHX1529

## 2014-02-03 MED ORDER — SODIUM CHLORIDE 0.9 % IV SOLN: 500.0000 mL | INTRAVENOUS | Status: DC

## 2014-02-03 NOTE — Progress Notes (Signed)
Called to room to assist during endoscopic procedure.  Patient ID and intended procedure confirmed with present staff. Received instructions for my participation in the procedure from the performing physician.  

## 2014-02-03 NOTE — Op Note (Signed)
[place]  [address] [place], [phone]   ENDOSCOPY PROCEDURE REPORT  PATIENT: Rachel Gardner, Rachel Gardner  MR#: [id number] BIRTHDATE: 1946-01-27 , 67  yrs. old GENDER: Female ENDOSCOPIST: Lafayette Dragon, MD REFERRED BY:  Stacy Gardner PA PROCEDURE DATE:  02/03/2014 PROCEDURE:  EGD w/ biopsy ASA CLASS:     Class II INDICATIONS:  acute episode of epigastric pain.  Post remote cholecystectomy.  Ultrasound negative.  Improved on Nexium.  Upper endoscopy in 2011 and 2012 showed gastritis. MEDICATIONS: MAC sedation, administered by CRNA and Propofol (Diprivan) 60 mg IV TOPICAL ANESTHETIC: none  DESCRIPTION OF PROCEDURE: After the risks benefits and alternatives of the procedure were thoroughly explained, informed consent was obtained.  The LB [id number] endoscope was introduced through the mouth and advanced to the second portion of the duodenum. Without limitations.  The instrument was slowly withdrawn as the mucosa was fully examined.      [Esophagus: esophageal mucosa appeared normal in the proximal and mid esophagus. Z line was irregular and there were several islets of gastric mucosa proximal to the Z line ,biopsies were taken to rule out Barrett's esophagus          The scope was then withdrawn from the patient and the procedure completed. stomach: There was a small reducible hiatal hernia extending from 37-39 cm from the incisors. There were no Cameron erosions or stricture . The gastric mucosa was mildly erythematous in the gastric antrum. Biopsies were taken to rule out H. pylori. There were no erosions. Gastric outlet was normal. Retroflexion of endoscope and the normal fundus and cardiaDuodenum: Duodenal bulb and descending duodenum was unremarkable  COMPLICATIONS: There were no complications. ENDOSCOPIC IMPRESSION:  irregular Z line status post biopsies to rule out Barrett's esophagus Small reducible hiatal hernia extending from 37-39 cm from  the incisors Minimal antral gastritis. Status post biopsies to rule out H. pylori  RECOMMENDATIONS: Continue Nexium 40 mg daily Await pathology results If pain persists consider CT scan of the abdomen/pelvis Followup office visit in 6-8 weeks  REPEAT EXAM: Pending biopsy results  eSigned:  Lafayette Dragon, MD 02/03/2014 9:51 AM   CC:  PATIENT NAME:  Rachel Gardner, Rachel Gardner MR#: [id number]

## 2014-02-03 NOTE — Progress Notes (Signed)
Stable to RR 

## 2014-02-03 NOTE — Patient Instructions (Signed)
CONTINUE YOUR [place]. CALL DR. Nichola Sizer OFFICE FOR AN APPOINTMENT IN 6-8 WEEKS, [phone].    YOU HAD AN ENDOSCOPIC PROCEDURE TODAY AT [facility]: Refer to the procedure report that was given to you for any specific questions about what was found during the examination.  If the procedure report does not answer your questions, please call your gastroenterologist to clarify.  If you requested that your care partner not be given the details of your procedure findings, then the procedure report has been included in a sealed envelope for you to review at your convenience later.  YOU SHOULD EXPECT: Some feelings of bloating in the abdomen. Passage of more gas than usual.  Walking can help get rid of the air that was put into your GI tract during the procedure and reduce the bloating. If you had a lower endoscopy (such as a colonoscopy or flexible sigmoidoscopy) you may notice spotting of blood in your stool or on the toilet paper. If you underwent a bowel prep for your procedure, then you may not have a normal bowel movement for a few days.  DIET: Your first meal following the procedure should be a light meal and then it is ok to progress to your normal diet.  A half-sandwich or bowl of soup is an example of a good first meal.  Heavy or fried foods are harder to digest and may make you feel nauseous or bloated.  Likewise meals heavy in dairy and vegetables can cause extra gas to form and this can also increase the bloating.  Drink plenty of fluids but you should avoid alcoholic beverages for 24 hours.  ACTIVITY: Your care partner should take you home directly after the procedure.  You should plan to take it easy, moving slowly for the rest of the day.  You can resume normal activity the day after the procedure however you should NOT DRIVE or use heavy machinery for 24 hours (because of the sedation medicines used during the test).    SYMPTOMS TO REPORT IMMEDIATELY: A gastroenterologist  can be reached at any hour.  During normal business hours, 8:30 AM to 5:00 PM Monday through Friday, call [phone].  After hours and on weekends, please call the GI answering service at [phone] who will take a message and have the physician on call contact you.   Following upper endoscopy (EGD)  Vomiting of blood or coffee ground material  New chest pain or pain under the shoulder blades  Painful or persistently difficult swallowing  New shortness of breath  Fever of 100F or higher  Black, tarry-looking stools  FOLLOW UP: If any biopsies were taken you will be contacted by phone or by letter within the next 1-3 weeks.  Call your gastroenterologist if you have not heard about the biopsies in 3 weeks.  Our staff will call the home number listed on your records the next business day following your procedure to check on you and address any questions or concerns that you may have at that time regarding the information given to you following your procedure. This is a courtesy call and so if there is no answer at the home number and we have not heard from you through the emergency physician on call, we will assume that you have returned to your regular daily activities without incident.  SIGNATURES/CONFIDENTIALITY: You and/or your care partner have signed paperwork which will be entered into your electronic medical record.  These signatures attest to the fact that that  the information above on your After Visit Summary has been reviewed and is understood.  Full responsibility of the confidentiality of this discharge information lies with you and/or your care-partner.

## 2014-02-06 NOTE — Telephone Encounter (Signed)
  Follow up Call-  Call back number 02/03/2014  Post procedure Call Back phone  # [phone]  Permission to leave phone message Yes     Patient questions:  No message left.  Not same name on answering machine.  Company.

## 2014-02-07 ENCOUNTER — Encounter: Payer: Self-pay | Admitting: Internal Medicine

## 2014-02-07 ENCOUNTER — Encounter: Payer: Self-pay | Admitting: *Deleted

## 2014-03-04 ENCOUNTER — Encounter: Payer: Self-pay | Admitting: Physician Assistant

## 2014-03-21 ENCOUNTER — Ambulatory Visit (INDEPENDENT_AMBULATORY_CARE_PROVIDER_SITE_OTHER): Payer: Medicare Other | Admitting: Internal Medicine

## 2014-03-21 ENCOUNTER — Encounter: Payer: Self-pay | Admitting: Internal Medicine

## 2014-03-21 VITALS — BP 128/68 | HR 84 | Ht 60.75 in | Wt 125.5 lb

## 2014-03-21 DIAGNOSIS — R1013 Epigastric pain: Secondary | ICD-10-CM | POA: Diagnosis not present

## 2014-03-21 NOTE — Progress Notes (Signed)
Rachel Gardner 1946/08/03 [id number]  Note: This dictation was prepared with Dragon digital system. Any transcriptional errors that result from this procedure are unintentional.   History of Present Illness:  This is a 68 year old [place] woman who comes for followup of right upper quadrant abdominal pain. All of her symptoms have now resolved. She currently has no complaints. Her upper abdominal ultrasound in February 2015 showed her to be status post cholecystectomy. Her liver function tests were normal. An upper endoscopy on 02/03/2014 was normal. She is H. pylori negative. Her last colonoscopy in January 2012 showed her to be status post sigmoid resection for diverticulitis. There were no polyps. She has started on a gluten-free  high fiber.diet. She has been jogging and staying active. She reports a good level of energy.    Past Medical History  Diagnosis Date  . Hyperlipidemia   . Blood in stool   . Diverticulitis   . GERD (gastroesophageal reflux disease)   . Allergy     hay fever  . Hypertension   . Kidney stones   . 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 Helicobacter pylori infection   . Hypothyroidism   . Obstructive sleep apnea   . Genital herpes   . Fatty liver 05/28/10    [facility] at [facility]  . Hiatal hernia 05/28/10    CT @ [employer] [facility]  . Meniere's disease   . BPPV (benign paroxysmal positional vertigo)     Past Surgical History  Procedure Laterality Date  . Cholecystectomy  1989  . Appendectomy  1989  . Abdominal hysterectomy  1995  . Total thyroidectomy  1995  . Breast surgery  1989    breast reduction  . 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 Laparoscopic sigmoid colectomy  2012    with takedown of splenic flexure; cystoscopy with bilateral ureteral stent placement  . 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 Statins Other (See Comments)    Joint pain     Family history and social  history have been reviewed.  Review of Systems:   The remainder of the 10 point ROS is negative except as outlined in the H&P  Physical Exam: General Appearance Well developed, in no distress Eyes  Non icteric  HEENT  Non traumatic, normocephalic  Mouth No lesion, tongue papillated, no cheilosis Neck Supple without adenopathy, thyroid not enlarged, no carotid bruits, no JVD Lungs Clear to auscultation bilaterally COR Normal S1, normal S2, regular rhythm, no murmur, quiet precordium Abdomen soft with normal active bowel sounds. Well-healed surgical scars. Liver edge at costal margin. No tenderness. No mass Rectal not repeated Extremities  No pedal edema Skin No lesions Neurological Alert and oriented x 3 Psychological Normal mood and affect  Assessment and Plan:   Problem #1 Acute right upper quadrant abdominal pain which has now resolved. There has been no definite diagnosis. We are possibly dealing with an irritable bowel syndrome. She is now following a gluten-free diet and is very happy with the self-imposed restrictions. We have discussed high-fiber supplements. I will see her on an as necessary basis.    Delfin Edis 03/21/2014

## 2014-03-21 NOTE — Patient Instructions (Addendum)
You have been given a separate informational sheet regarding your tobacco use, the importance of quitting and local resources to help you quit. Stacy Gardner PA

## 2014-03-22 ENCOUNTER — Encounter: Payer: Medicare Other | Admitting: Internal Medicine

## 2014-05-08 ENCOUNTER — Encounter: Payer: Self-pay | Admitting: Physician Assistant

## 2014-05-18 DIAGNOSIS — L988 Other specified disorders of the skin and subcutaneous tissue: Secondary | ICD-10-CM | POA: Diagnosis not present

## 2014-05-24 ENCOUNTER — Ambulatory Visit (INDEPENDENT_AMBULATORY_CARE_PROVIDER_SITE_OTHER): Payer: Medicare Other

## 2014-05-24 ENCOUNTER — Encounter: Payer: Self-pay | Admitting: Internal Medicine

## 2014-05-24 ENCOUNTER — Ambulatory Visit (INDEPENDENT_AMBULATORY_CARE_PROVIDER_SITE_OTHER): Payer: Medicare Other | Admitting: Internal Medicine

## 2014-05-24 VITALS — BP 100/68 | HR 80 | Temp 98.5°F | Resp 16 | Wt 127.0 lb

## 2014-05-24 DIAGNOSIS — E785 Hyperlipidemia, unspecified: Secondary | ICD-10-CM

## 2014-05-24 DIAGNOSIS — E039 Hypothyroidism, unspecified: Secondary | ICD-10-CM

## 2014-05-24 DIAGNOSIS — I10 Essential (primary) hypertension: Secondary | ICD-10-CM

## 2014-05-24 DIAGNOSIS — R159 Full incontinence of feces: Secondary | ICD-10-CM

## 2014-05-24 DIAGNOSIS — R209 Unspecified disturbances of skin sensation: Secondary | ICD-10-CM

## 2014-05-24 DIAGNOSIS — M2550 Pain in unspecified joint: Secondary | ICD-10-CM

## 2014-05-24 DIAGNOSIS — E559 Vitamin D deficiency, unspecified: Secondary | ICD-10-CM

## 2014-05-24 DIAGNOSIS — R202 Paresthesia of skin: Secondary | ICD-10-CM

## 2014-05-24 LAB — URINALYSIS, ROUTINE W REFLEX MICROSCOPIC
BILIRUBIN URINE: NEGATIVE
Ketones, ur: NEGATIVE
Leukocytes, UA: NEGATIVE
Nitrite: NEGATIVE
Specific Gravity, Urine: >=1.03 — AB (ref 1.000–1.030)
Total Protein, Urine: NEGATIVE
UROBILINOGEN UA: 0.2 (ref 0.0–1.0)
Urine Glucose: NEGATIVE
pH: 5.5 (ref 5.0–8.0)

## 2014-05-24 LAB — CBC WITH DIFFERENTIAL/PLATELET
Basophils Absolute: 0 10*3/uL (ref 0.0–0.1)
Basophils Relative: 0.4 % (ref 0.0–3.0)
EOS PCT: 1 % (ref 0.0–5.0)
Eosinophils Absolute: 0.1 10*3/uL (ref 0.0–0.7)
HEMATOCRIT: 40.5 % (ref 36.0–46.0)
HEMOGLOBIN: 13.8 g/dL (ref 12.0–15.0)
LYMPHS ABS: 2.9 10*3/uL (ref 0.7–4.0)
Lymphocytes Relative: 30.6 % (ref 12.0–46.0)
MCHC: 34 g/dL (ref 30.0–36.0)
MCV: 87.4 fl (ref 78.0–100.0)
MONO ABS: 0.7 10*3/uL (ref 0.1–1.0)
Monocytes Relative: 7.1 % (ref 3.0–12.0)
NEUTROS ABS: 5.7 10*3/uL (ref 1.4–7.7)
Neutrophils Relative %: 60.9 % (ref 43.0–77.0)
Platelets: 191 10*3/uL (ref 150.0–400.0)
RBC: 4.63 Mil/uL (ref 3.87–5.11)
RDW: 12.9 % (ref 11.5–15.5)
WBC: 9.4 10*3/uL (ref 4.0–10.5)

## 2014-05-24 MED ORDER — DICLOFENAC 35 MG PO CAPS: ORAL_CAPSULE | ORAL | Status: DC

## 2014-05-24 NOTE — Assessment & Plan Note (Signed)
Surg consult later

## 2014-05-24 NOTE — Assessment & Plan Note (Addendum)
5/15 diffuse -- ?etiology Lbs Ibuprofen 800 mg bid pc - d/c Zorvolex 35 mg tid

## 2014-05-24 NOTE — Progress Notes (Signed)
Pre visit review using our clinic review tool, if applicable. No additional management support is needed unless otherwise documented below in the visit note. 

## 2014-05-24 NOTE — Assessment & Plan Note (Signed)
labs

## 2014-05-24 NOTE — Progress Notes (Signed)
   Subjective:     HPI  New pt - used to see Stacy Gardner   C/o fatigue x 2 wk. C/o a lot of body aches starting last Sat. C/o forgetting stuff x 3 mo. She is very active - waking 5 km fast. C/o stool incontinence at times/ constipation. She is s/p partial L colectomy in 2012 due to diverticulitis... F/u HTN, dyslipidemia.  Review of Systems  Constitutional: Positive for fatigue. Negative for chills, activity change, appetite change and unexpected weight change.  HENT: Negative for congestion, mouth sores and sinus pressure.   Eyes: Negative for visual disturbance.  Respiratory: Negative for cough and chest tightness.   Gastrointestinal: Negative for nausea and abdominal pain.  Genitourinary: Negative for frequency, difficulty urinating and vaginal pain.  Musculoskeletal: Positive for arthralgias, back pain, neck pain and neck stiffness. Negative for gait problem.       X1 wk  Skin: Negative for pallor and rash.  Neurological: Negative for dizziness, tremors, weakness, numbness and headaches.  Psychiatric/Behavioral: Negative for suicidal ideas, confusion and sleep disturbance. The patient is not nervous/anxious.        Objective:   Physical Exam  Constitutional: She appears well-developed. No distress.  HENT:  Head: Normocephalic.  Right Ear: External ear normal.  Left Ear: External ear normal.  Nose: Nose normal.  Mouth/Throat: Oropharynx is clear and moist.  Eyes: Conjunctivae are normal. Pupils are equal, round, and reactive to light. Right eye exhibits no discharge. Left eye exhibits no discharge.  Neck: Normal range of motion. Neck supple. No JVD present. No tracheal deviation present. No thyromegaly present.  Cardiovascular: Normal rate, regular rhythm and normal heart sounds.   Pulmonary/Chest: No stridor. No respiratory distress. She has no wheezes.  Abdominal: Soft. Bowel sounds are normal. She exhibits no distension and no mass. There is no tenderness. There is no  rebound and no guarding.  Musculoskeletal: She exhibits tenderness. She exhibits no edema.  Stiff and tender all over, slow  Lymphadenopathy:    She has no cervical adenopathy.  Neurological: She displays normal reflexes. No cranial nerve deficit. She exhibits normal muscle tone. Coordination normal.  Skin: No rash noted. No erythema.  Psychiatric: She has a normal mood and affect. Her behavior is normal. Judgment and thought content normal.    Lab Results  Component Value Date   WBC 10.7* 01/14/2014   HGB 13.6 01/14/2014   HCT 39.3 01/14/2014   PLT 187 01/14/2014   GLUCOSE 90 01/14/2014   CHOL 252* 12/06/2013   TRIG 218.0* 12/06/2013   HDL 42.90 12/06/2013   LDLDIRECT 172.0 12/06/2013   ALT 23 01/24/2014   AST 20 01/24/2014   NA 142 01/14/2014   K 3.7 01/14/2014   CL 101 01/14/2014   CREATININE 0.80 01/14/2014   BUN 24* 01/14/2014   CO2 25 01/14/2014   TSH 3.89 12/06/2013         Assessment & Plan:

## 2014-05-24 NOTE — Assessment & Plan Note (Signed)
Continue with current prescription therapy as reflected on the Med list.  

## 2014-05-25 ENCOUNTER — Ambulatory Visit: Payer: Medicare Other | Admitting: Internal Medicine

## 2014-05-25 ENCOUNTER — Ambulatory Visit: Payer: Medicare Other

## 2014-05-25 DIAGNOSIS — E7800 Pure hypercholesterolemia, unspecified: Secondary | ICD-10-CM

## 2014-05-25 LAB — CK: Total CK: 113 U/L (ref 7–177)

## 2014-05-25 LAB — TSH: TSH: 4.91 u[IU]/mL — ABNORMAL HIGH (ref 0.35–4.50)

## 2014-05-25 LAB — SEDIMENTATION RATE: Sed Rate: 28 mm/hr — ABNORMAL HIGH (ref 0–22)

## 2014-05-25 LAB — BASIC METABOLIC PANEL
BUN: 26 mg/dL — AB (ref 6–23)
CHLORIDE: 101 meq/L (ref 96–112)
CO2: 29 mEq/L (ref 19–32)
Calcium: 9.5 mg/dL (ref 8.4–10.5)
Creatinine, Ser: 0.9 mg/dL (ref 0.4–1.2)
GFR: 63.87 mL/min (ref >60.00)
Glucose, Bld: 78 mg/dL (ref 70–99)
POTASSIUM: 3.8 meq/L (ref 3.5–5.1)
Sodium: 139 mEq/L (ref 135–145)

## 2014-05-25 LAB — HEPATIC FUNCTION PANEL
ALBUMIN: 4 g/dL (ref 3.5–5.2)
ALT: 16 U/L (ref 0–35)
AST: 19 U/L (ref 0–37)
Alkaline Phosphatase: 61 U/L (ref 39–117)
Bilirubin, Direct: 0.1 mg/dL (ref 0.0–0.3)
TOTAL PROTEIN: 7.1 g/dL (ref 6.0–8.3)
Total Bilirubin: 0.4 mg/dL (ref 0.2–1.2)

## 2014-05-25 LAB — URIC ACID: Uric Acid, Serum: 4.1 mg/dL (ref 2.4–7.0)

## 2014-05-25 LAB — T4, FREE: FREE T4: 0.88 ng/dL (ref 0.60–1.60)

## 2014-05-25 LAB — LIPID PANEL
CHOL/HDL RATIO: 5
Cholesterol: 229 mg/dL — ABNORMAL HIGH (ref 0–200)
HDL: 41.9 mg/dL (ref >39.00)
LDL Cholesterol: 132 mg/dL — ABNORMAL HIGH (ref 0–99)
Triglycerides: 276 mg/dL — ABNORMAL HIGH (ref 0.0–149.0)
VLDL: 55.2 mg/dL — ABNORMAL HIGH (ref 0.0–40.0)

## 2014-05-25 LAB — MAGNESIUM: MAGNESIUM: 1.9 mg/dL (ref 1.5–2.5)

## 2014-05-25 LAB — RHEUMATOID FACTOR: Rhuematoid fact SerPl-aCnc: <10 IU/mL (ref <=14)

## 2014-05-25 LAB — ROCKY MTN SPOTTED FVR AB, IGG-BLOOD: RMSF IGG: 0.14 IV

## 2014-05-25 LAB — VITAMIN B12: Vitamin B-12: 445 pg/mL (ref 211–911)

## 2014-05-25 LAB — VITAMIN D 25 HYDROXY (VIT D DEFICIENCY, FRACTURES): Vit D, 25-Hydroxy: 25 ng/mL — ABNORMAL LOW (ref 30–89)

## 2014-05-25 LAB — ANA: Anti Nuclear Antibody(ANA): NEGATIVE

## 2014-05-25 LAB — LYME AB/WESTERN BLOT REFLEX: B burgdorferi Ab IgG+IgM: 0.4 {ISR}

## 2014-05-31 ENCOUNTER — Ambulatory Visit (INDEPENDENT_AMBULATORY_CARE_PROVIDER_SITE_OTHER): Payer: Medicare Other | Admitting: Internal Medicine

## 2014-05-31 ENCOUNTER — Encounter: Payer: Self-pay | Admitting: Internal Medicine

## 2014-05-31 VITALS — BP 100/64 | HR 83 | Temp 98.2°F | Ht 62.0 in | Wt 127.0 lb

## 2014-05-31 DIAGNOSIS — E559 Vitamin D deficiency, unspecified: Secondary | ICD-10-CM

## 2014-05-31 DIAGNOSIS — R3129 Other microscopic hematuria: Secondary | ICD-10-CM | POA: Diagnosis not present

## 2014-05-31 DIAGNOSIS — M2550 Pain in unspecified joint: Secondary | ICD-10-CM | POA: Diagnosis not present

## 2014-05-31 DIAGNOSIS — I10 Essential (primary) hypertension: Secondary | ICD-10-CM | POA: Diagnosis not present

## 2014-05-31 DIAGNOSIS — E039 Hypothyroidism, unspecified: Secondary | ICD-10-CM | POA: Diagnosis not present

## 2014-05-31 MED ORDER — DICLOFENAC SODIUM 50 MG PO TBEC: 50.0000 mg | DELAYED_RELEASE_TABLET | Freq: Two times a day (BID) | ORAL | Status: DC

## 2014-05-31 MED ORDER — ERGOCALCIFEROL 1.25 MG (50000 UT) PO CAPS: 50000.0000 [IU] | ORAL_CAPSULE | ORAL | Status: DC

## 2014-05-31 MED ORDER — VITAMIN D 1000 UNITS PO TABS
1000.0000 [IU] | ORAL_TABLET | Freq: Every day | ORAL | Status: AC
Start: 2014-05-31 — End: 2015-05-31

## 2014-05-31 NOTE — Progress Notes (Signed)
   Subjective:     HPI    F/u fatigue x 2 wk. F/u a lot of body aches starting 2-3 wks ago - 90% better on Diclofenac. C/o forgetting stuff x 3 mo. She is very active - waking 5 km fast. C/o stool incontinence at times/ constipation. She is s/p partial L colectomy in 2012 due to diverticulitis... F/u HTN, dyslipidemia.  Review of Systems  Constitutional: Positive for fatigue. Negative for chills, activity change, appetite change and unexpected weight change.  HENT: Negative for congestion, mouth sores and sinus pressure.   Eyes: Negative for visual disturbance.  Respiratory: Negative for cough and chest tightness.   Gastrointestinal: Negative for nausea and abdominal pain.  Genitourinary: Negative for frequency, difficulty urinating and vaginal pain.  Musculoskeletal: Positive for arthralgias. Negative for back pain, gait problem, neck pain and neck stiffness.       90% better on Diclofenac  Skin: Negative for pallor and rash.  Neurological: Negative for dizziness, tremors, weakness, numbness and headaches.  Psychiatric/Behavioral: Positive for sleep disturbance and decreased concentration. Negative for suicidal ideas and confusion. The patient is not nervous/anxious.        Objective:   Physical Exam  Constitutional: She appears well-developed. No distress.  HENT:  Head: Normocephalic.  Right Ear: External ear normal.  Left Ear: External ear normal.  Nose: Nose normal.  Mouth/Throat: Oropharynx is clear and moist.  Eyes: Conjunctivae are normal. Pupils are equal, round, and reactive to light. Right eye exhibits no discharge. Left eye exhibits no discharge.  Neck: Normal range of motion. Neck supple. No JVD present. No tracheal deviation present. No thyromegaly present.  Cardiovascular: Normal rate, regular rhythm and normal heart sounds.   Pulmonary/Chest: No stridor. No respiratory distress. She has no wheezes.  Abdominal: Soft. Bowel sounds are normal. She exhibits no  distension and no mass. There is no tenderness. There is no rebound and no guarding.  Musculoskeletal: She exhibits no edema and no tenderness.  Not stiff and tender all over  Lymphadenopathy:    She has no cervical adenopathy.  Neurological: She displays normal reflexes. No cranial nerve deficit. She exhibits normal muscle tone. Coordination normal.  Skin: No rash noted. No erythema.  Psychiatric: She has a normal mood and affect. Her behavior is normal. Judgment and thought content normal.    Lab Results  Component Value Date   WBC 9.4 05/24/2014   HGB 13.8 05/24/2014   HCT 40.5 05/24/2014   PLT 191.0 05/24/2014   GLUCOSE 78 05/24/2014   CHOL 229* 05/25/2014   TRIG 276.0* 05/25/2014   HDL 41.90 05/25/2014   LDLDIRECT 172.0 12/06/2013   LDLCALC 132* 05/25/2014   ALT 16 05/24/2014   AST 19 05/24/2014   NA 139 05/24/2014   K 3.8 05/24/2014   CL 101 05/24/2014   CREATININE 0.9 05/24/2014   BUN 26* 05/24/2014   CO2 29 05/24/2014   TSH 4.91* 05/24/2014         Assessment & Plan:

## 2014-05-31 NOTE — Assessment & Plan Note (Signed)
5/15 diffuse -- ?etiology Negative w/up 90% better on Diclofenac - cont Rx Vit D

## 2014-05-31 NOTE — Progress Notes (Signed)
Pre visit review using our clinic review tool, if applicable. No additional management support is needed unless otherwise documented below in the visit note. 

## 2014-05-31 NOTE — Assessment & Plan Note (Signed)
Continue with current prescription therapy as reflected on the Med list.  

## 2014-05-31 NOTE — Assessment & Plan Note (Signed)
Doing well  Take hyzaar 1/2 tab

## 2014-05-31 NOTE — Assessment & Plan Note (Signed)
Chronic S/p Urol eval and cystoscopy 2014

## 2014-05-31 NOTE — Assessment & Plan Note (Signed)
See Rx 

## 2014-06-01 ENCOUNTER — Telehealth: Payer: Self-pay | Admitting: Internal Medicine

## 2014-06-01 NOTE — Telephone Encounter (Signed)
Relevant patient education assigned to patient using Emmi. ° °

## 2014-06-08 DIAGNOSIS — H9319 Tinnitus, unspecified ear: Secondary | ICD-10-CM | POA: Diagnosis not present

## 2014-06-08 DIAGNOSIS — L988 Other specified disorders of the skin and subcutaneous tissue: Secondary | ICD-10-CM | POA: Diagnosis not present

## 2014-06-08 DIAGNOSIS — H903 Sensorineural hearing loss, bilateral: Secondary | ICD-10-CM | POA: Diagnosis not present

## 2014-07-06 DIAGNOSIS — H903 Sensorineural hearing loss, bilateral: Secondary | ICD-10-CM | POA: Diagnosis not present

## 2014-08-04 ENCOUNTER — Encounter: Payer: Self-pay | Admitting: Internal Medicine

## 2014-08-04 ENCOUNTER — Ambulatory Visit (INDEPENDENT_AMBULATORY_CARE_PROVIDER_SITE_OTHER)
Admission: RE | Admit: 2014-08-04 | Discharge: 2014-08-04 | Disposition: A | Discharge status: Home / Self Care | Payer: Medicare Other | Source: Ambulatory Visit | Attending: Internal Medicine | Admitting: Internal Medicine

## 2014-08-04 ENCOUNTER — Ambulatory Visit (INDEPENDENT_AMBULATORY_CARE_PROVIDER_SITE_OTHER): Payer: Medicare Other | Admitting: Internal Medicine

## 2014-08-04 VITALS — BP 119/78 | HR 96 | Temp 98.0°F | Wt 130.0 lb

## 2014-08-04 DIAGNOSIS — M542 Cervicalgia: Secondary | ICD-10-CM

## 2014-08-04 DIAGNOSIS — R51 Headache: Secondary | ICD-10-CM

## 2014-08-04 DIAGNOSIS — R519 Headache, unspecified: Secondary | ICD-10-CM | POA: Insufficient documentation

## 2014-08-04 DIAGNOSIS — G8929 Other chronic pain: Secondary | ICD-10-CM | POA: Insufficient documentation

## 2014-08-04 DIAGNOSIS — S0993XA Unspecified injury of face, initial encounter: Secondary | ICD-10-CM | POA: Diagnosis not present

## 2014-08-04 MED ORDER — TRAMADOL-ACETAMINOPHEN 37.5-325 MG PO TABS: 0.5000 | ORAL_TABLET | Freq: Three times a day (TID) | ORAL | Status: DC | PRN

## 2014-08-04 NOTE — Assessment & Plan Note (Signed)
Fell on 07/31/14 Collar Tramadol prn To ER if worse

## 2014-08-04 NOTE — Progress Notes (Signed)
Pre visit review using our clinic review tool, if applicable. No additional management support is needed unless otherwise documented below in the visit note. 

## 2014-08-04 NOTE — Assessment & Plan Note (Signed)
Fell on 07/31/14 Collar Tramadol prn X ray

## 2014-08-04 NOTE — Progress Notes (Signed)
   Subjective:     Neck Pain  This is a new problem. The current episode started in the past 7 days (fell on 07/31/14 - a chair rolled out of her in [place], fell against the wall). The problem has been unchanged. The pain is associated with a fall. The pain is present in the midline and occipital region. The quality of the pain is described as aching. The pain is moderate. The symptoms are aggravated by twisting. Stiffness is present in the morning. Pertinent negatives include no headaches, numbness or weakness.   C/o HA x2-3 d   F/u HTN, dyslipidemia.  Review of Systems  Constitutional: Positive for fatigue. Negative for chills, activity change, appetite change and unexpected weight change.  HENT: Negative for congestion, mouth sores and sinus pressure.   Eyes: Negative for visual disturbance.  Respiratory: Negative for cough and chest tightness.   Gastrointestinal: Negative for nausea and abdominal pain.  Genitourinary: Negative for frequency, difficulty urinating and vaginal pain.  Musculoskeletal: Positive for arthralgias. Negative for back pain, gait problem, neck pain and neck stiffness.       90% better on Diclofenac  Skin: Negative for pallor and rash.  Neurological: Negative for dizziness, tremors, weakness, numbness and headaches.  Psychiatric/Behavioral: Positive for sleep disturbance and decreased concentration. Negative for suicidal ideas and confusion. The patient is not nervous/anxious.        Objective:   Physical Exam  Constitutional: She appears well-developed. No distress.  HENT:  Head: Normocephalic.  Right Ear: External ear normal.  Left Ear: External ear normal.  Nose: Nose normal.  Mouth/Throat: Oropharynx is clear and moist.  Eyes: Conjunctivae are normal. Pupils are equal, round, and reactive to light. Right eye exhibits no discharge. Left eye exhibits no discharge.  Neck: Normal range of motion. Neck supple. No JVD present. No tracheal deviation present.  No thyromegaly present.  Cardiovascular: Normal rate, regular rhythm and normal heart sounds.   Pulmonary/Chest: No stridor. No respiratory distress. She has no wheezes.  Abdominal: Soft. Bowel sounds are normal. She exhibits no distension and no mass. There is no tenderness. There is no rebound and no guarding.  Musculoskeletal: She exhibits no edema and no tenderness.  Not stiff and tender all over  Lymphadenopathy:    She has no cervical adenopathy.  Neurological: She displays normal reflexes. No cranial nerve deficit. She exhibits normal muscle tone. Coordination normal.  Skin: No rash noted. No erythema.  Psychiatric: She has a normal mood and affect. Her behavior is normal. Judgment and thought content normal.    Lab Results  Component Value Date   WBC 9.4 05/24/2014   HGB 13.8 05/24/2014   HCT 40.5 05/24/2014   PLT 191.0 05/24/2014   GLUCOSE 78 05/24/2014   CHOL 229* 05/25/2014   TRIG 276.0* 05/25/2014   HDL 41.90 05/25/2014   LDLDIRECT 172.0 12/06/2013   LDLCALC 132* 05/25/2014   ALT 16 05/24/2014   AST 19 05/24/2014   NA 139 05/24/2014   K 3.8 05/24/2014   CL 101 05/24/2014   CREATININE 0.9 05/24/2014   BUN 26* 05/24/2014   CO2 29 05/24/2014   TSH 4.91* 05/24/2014         Assessment & Plan:

## 2014-08-07 ENCOUNTER — Encounter: Payer: Self-pay | Admitting: Internal Medicine

## 2014-08-09 ENCOUNTER — Ambulatory Visit (INDEPENDENT_AMBULATORY_CARE_PROVIDER_SITE_OTHER): Payer: Medicare Other | Admitting: Internal Medicine

## 2014-08-09 ENCOUNTER — Ambulatory Visit (INDEPENDENT_AMBULATORY_CARE_PROVIDER_SITE_OTHER)
Admission: RE | Admit: 2014-08-09 | Discharge: 2014-08-09 | Disposition: A | Discharge status: Home / Self Care | Payer: Medicare Other | Source: Ambulatory Visit | Attending: Internal Medicine | Admitting: Internal Medicine

## 2014-08-09 ENCOUNTER — Encounter: Payer: Self-pay | Admitting: Internal Medicine

## 2014-08-09 VITALS — BP 107/73 | HR 88 | Temp 98.2°F | Wt 129.0 lb

## 2014-08-09 DIAGNOSIS — M542 Cervicalgia: Secondary | ICD-10-CM | POA: Diagnosis not present

## 2014-08-09 DIAGNOSIS — Z1239 Encounter for other screening for malignant neoplasm of breast: Secondary | ICD-10-CM

## 2014-08-09 DIAGNOSIS — Z129 Encounter for screening for malignant neoplasm, site unspecified: Secondary | ICD-10-CM

## 2014-08-09 DIAGNOSIS — R51 Headache: Secondary | ICD-10-CM

## 2014-08-09 DIAGNOSIS — R296 Repeated falls: Secondary | ICD-10-CM

## 2014-08-09 DIAGNOSIS — Z23 Encounter for immunization: Secondary | ICD-10-CM | POA: Diagnosis not present

## 2014-08-09 DIAGNOSIS — I10 Essential (primary) hypertension: Secondary | ICD-10-CM | POA: Diagnosis not present

## 2014-08-09 DIAGNOSIS — M2550 Pain in unspecified joint: Secondary | ICD-10-CM

## 2014-08-09 DIAGNOSIS — S0990XA Unspecified injury of head, initial encounter: Secondary | ICD-10-CM | POA: Diagnosis not present

## 2014-08-09 DIAGNOSIS — E039 Hypothyroidism, unspecified: Secondary | ICD-10-CM

## 2014-08-09 NOTE — Progress Notes (Signed)
Pre visit review using our clinic review tool, if applicable. No additional management support is needed unless otherwise documented below in the visit note. 

## 2014-08-09 NOTE — Assessment & Plan Note (Signed)
Better  X ray was ok

## 2014-08-09 NOTE — Progress Notes (Signed)
   Subjective:     Neck Pain  This is a new problem. The current episode started 1 to 4 weeks ago (fell on 07/31/14 - a chair rolled out of her in [place], fell against the wall). The problem has been gradually improving. The pain is associated with a fall. The pain is present in the midline and occipital region. The quality of the pain is described as aching. The pain is moderate. The symptoms are aggravated by twisting. Stiffness is present in the morning. Associated symptoms include headaches. Pertinent negatives include no numbness or weakness.   C/o HA x1 week   F/u HTN, dyslipidemia.  Review of Systems  Constitutional: Positive for fatigue. Negative for chills, activity change, appetite change and unexpected weight change.  HENT: Negative for congestion, mouth sores and sinus pressure.   Eyes: Negative for visual disturbance.  Respiratory: Negative for cough and chest tightness.   Gastrointestinal: Negative for nausea and abdominal pain.  Genitourinary: Negative for frequency, difficulty urinating and vaginal pain.  Musculoskeletal: Positive for neck pain. Negative for arthralgias, back pain, gait problem and neck stiffness.  Skin: Negative for pallor and rash.  Neurological: Positive for headaches. Negative for dizziness, tremors, weakness and numbness.  Psychiatric/Behavioral: Positive for sleep disturbance and decreased concentration. Negative for suicidal ideas and confusion. The patient is not nervous/anxious.        Objective:   Physical Exam  Constitutional: She appears well-developed. No distress.  HENT:  Head: Normocephalic.  Right Ear: External ear normal.  Left Ear: External ear normal.  Nose: Nose normal.  Mouth/Throat: Oropharynx is clear and moist.  Eyes: Conjunctivae are normal. Pupils are equal, round, and reactive to light. Right eye exhibits no discharge. Left eye exhibits no discharge.  Neck: Normal range of motion. Neck supple. No JVD present. No tracheal  deviation present. No thyromegaly present.  Cardiovascular: Normal rate, regular rhythm and normal heart sounds.   Pulmonary/Chest: No stridor. No respiratory distress. She has no wheezes.  Abdominal: Soft. Bowel sounds are normal. She exhibits no distension and no mass. There is no tenderness. There is no rebound and no guarding.  Musculoskeletal: She exhibits no edema and no tenderness.  Not stiff and tender all over  Lymphadenopathy:    She has no cervical adenopathy.  Neurological: She displays normal reflexes. No cranial nerve deficit. She exhibits normal muscle tone. Coordination normal.  Skin: No rash noted. No erythema.  Psychiatric: She has a normal mood and affect. Her behavior is normal. Judgment and thought content normal.    Lab Results  Component Value Date   WBC 9.4 05/24/2014   HGB 13.8 05/24/2014   HCT 40.5 05/24/2014   PLT 191.0 05/24/2014   GLUCOSE 78 05/24/2014   CHOL 229* 05/25/2014   TRIG 276.0* 05/25/2014   HDL 41.90 05/25/2014   LDLDIRECT 172.0 12/06/2013   LDLCALC 132* 05/25/2014   ALT 16 05/24/2014   AST 19 05/24/2014   NA 139 05/24/2014   K 3.8 05/24/2014   CL 101 05/24/2014   CREATININE 0.9 05/24/2014   BUN 26* 05/24/2014   CO2 29 05/24/2014   TSH 4.91* 05/24/2014         Assessment & Plan:

## 2014-08-09 NOTE — Assessment & Plan Note (Signed)
CT head 

## 2014-08-09 NOTE — Assessment & Plan Note (Signed)
Continue with current prescription therapy as reflected on the Med list.  

## 2014-08-09 NOTE — Assessment & Plan Note (Signed)
?  etiology - virus - resolved

## 2014-08-09 NOTE — Assessment & Plan Note (Signed)
BP Readings from Last 3 Encounters:  08/09/14 107/73  08/04/14 119/78  05/31/14 100/64

## 2014-08-10 NOTE — Addendum Note (Signed)
Addended by: Cresenciano Lick on: 08/10/2014 08:03 AM   Modules accepted: Orders

## 2014-08-11 DIAGNOSIS — H25049 Posterior subcapsular polar age-related cataract, unspecified eye: Secondary | ICD-10-CM | POA: Diagnosis not present

## 2014-08-11 DIAGNOSIS — H4011X0 Primary open-angle glaucoma, stage unspecified: Secondary | ICD-10-CM | POA: Diagnosis not present

## 2014-08-11 DIAGNOSIS — H903 Sensorineural hearing loss, bilateral: Secondary | ICD-10-CM | POA: Diagnosis not present

## 2014-08-11 DIAGNOSIS — H9319 Tinnitus, unspecified ear: Secondary | ICD-10-CM | POA: Diagnosis not present

## 2014-08-11 DIAGNOSIS — H2510 Age-related nuclear cataract, unspecified eye: Secondary | ICD-10-CM | POA: Diagnosis not present

## 2014-08-11 DIAGNOSIS — H409 Unspecified glaucoma: Secondary | ICD-10-CM | POA: Diagnosis not present

## 2014-08-17 ENCOUNTER — Other Ambulatory Visit: Payer: Self-pay | Admitting: Internal Medicine

## 2014-08-17 DIAGNOSIS — Z1231 Encounter for screening mammogram for malignant neoplasm of breast: Secondary | ICD-10-CM

## 2014-08-24 ENCOUNTER — Ambulatory Visit (HOSPITAL_COMMUNITY)
Admission: RE | Admit: 2014-08-24 | Discharge: 2014-08-24 | Disposition: A | Discharge status: Home / Self Care | Payer: Medicare Other | Source: Ambulatory Visit | Attending: Internal Medicine | Admitting: Internal Medicine

## 2014-08-24 DIAGNOSIS — Z1231 Encounter for screening mammogram for malignant neoplasm of breast: Secondary | ICD-10-CM

## 2014-09-18 ENCOUNTER — Other Ambulatory Visit: Payer: Self-pay | Admitting: Internal Medicine

## 2014-09-18 ENCOUNTER — Ambulatory Visit (HOSPITAL_COMMUNITY)
Admission: RE | Admit: 2014-09-18 | Discharge: 2014-09-18 | Disposition: A | Discharge status: Home / Self Care | Payer: Medicare Other | Source: Ambulatory Visit | Attending: Internal Medicine | Admitting: Internal Medicine

## 2014-09-18 DIAGNOSIS — Z1231 Encounter for screening mammogram for malignant neoplasm of breast: Secondary | ICD-10-CM | POA: Diagnosis not present

## 2014-11-10 ENCOUNTER — Ambulatory Visit: Payer: Medicare Other | Admitting: Internal Medicine

## 2014-11-16 ENCOUNTER — Ambulatory Visit (INDEPENDENT_AMBULATORY_CARE_PROVIDER_SITE_OTHER): Payer: Medicare Other | Admitting: Internal Medicine

## 2014-11-16 ENCOUNTER — Encounter: Payer: Self-pay | Admitting: Internal Medicine

## 2014-11-16 VITALS — BP 130/76 | HR 76 | Temp 99.0°F | Resp 16 | Wt 130.0 lb

## 2014-11-16 DIAGNOSIS — M542 Cervicalgia: Secondary | ICD-10-CM | POA: Diagnosis not present

## 2014-11-16 DIAGNOSIS — G44319 Acute post-traumatic headache, not intractable: Secondary | ICD-10-CM | POA: Diagnosis not present

## 2014-11-16 DIAGNOSIS — E785 Hyperlipidemia, unspecified: Secondary | ICD-10-CM | POA: Diagnosis not present

## 2014-11-16 DIAGNOSIS — G8929 Other chronic pain: Secondary | ICD-10-CM | POA: Diagnosis not present

## 2014-11-16 DIAGNOSIS — Z23 Encounter for immunization: Secondary | ICD-10-CM

## 2014-11-16 NOTE — Assessment & Plan Note (Signed)
Fell on 07/31/14 Tender over C7 - X ray no Fx Ortho ref

## 2014-11-16 NOTE — Patient Instructions (Signed)
A contour pillow   

## 2014-11-16 NOTE — Assessment & Plan Note (Signed)
labs

## 2014-11-16 NOTE — Progress Notes (Signed)
Pre visit review using our clinic review tool, if applicable. No additional management support is needed unless otherwise documented below in the visit note. 

## 2014-11-16 NOTE — Assessment & Plan Note (Signed)
Resolved, post-concussion

## 2014-11-16 NOTE — Progress Notes (Signed)
   Subjective:     Neck Pain  This is a chronic problem. The current episode started more than 1 month ago. The problem occurs intermittently. The problem has been unchanged. The pain is associated with a sleep position and a twisting injury. The pain is present in the midline. The quality of the pain is described as aching and burning. The pain is moderate. The pain is worse during the night. Pertinent negatives include no headaches, numbness or weakness.     F/u HTN, dyslipidemia.  Review of Systems  Constitutional: Positive for fatigue. Negative for chills, activity change, appetite change and unexpected weight change.  HENT: Negative for congestion, mouth sores and sinus pressure.   Eyes: Negative for visual disturbance.  Respiratory: Negative for cough and chest tightness.   Gastrointestinal: Negative for nausea and abdominal pain.  Genitourinary: Negative for frequency, difficulty urinating and vaginal pain.  Musculoskeletal: Positive for neck pain. Negative for back pain, arthralgias, gait problem and neck stiffness.  Skin: Negative for pallor and rash.  Neurological: Negative for dizziness, tremors, weakness, numbness and headaches.  Psychiatric/Behavioral: Positive for sleep disturbance and decreased concentration. Negative for suicidal ideas and confusion. The patient is not nervous/anxious.        Objective:   Physical Exam  Constitutional: She appears well-developed. No distress.  HENT:  Head: Normocephalic.  Right Ear: External ear normal.  Left Ear: External ear normal.  Nose: Nose normal.  Mouth/Throat: Oropharynx is clear and moist.  Eyes: Conjunctivae are normal. Pupils are equal, round, and reactive to light. Right eye exhibits no discharge. Left eye exhibits no discharge.  Neck: Normal range of motion. Neck supple. No JVD present. No tracheal deviation present. No thyromegaly present.  Cardiovascular: Normal rate, regular rhythm and normal heart sounds.     Pulmonary/Chest: No stridor. No respiratory distress. She has no wheezes.  Abdominal: Soft. Bowel sounds are normal. She exhibits no distension and no mass. There is no tenderness. There is no rebound and no guarding.  Musculoskeletal: She exhibits no edema or tenderness.  Lymphadenopathy:    She has no cervical adenopathy.  Neurological: She displays normal reflexes. No cranial nerve deficit. She exhibits normal muscle tone. Coordination normal.  Skin: No rash noted. No erythema.  Psychiatric: She has a normal mood and affect. Her behavior is normal. Judgment and thought content normal.  Neck is tender over C7 B UEs ok  hard hearing  Lab Results  Component Value Date   WBC 9.4 05/24/2014   HGB 13.8 05/24/2014   HCT 40.5 05/24/2014   PLT 191.0 05/24/2014   GLUCOSE 78 05/24/2014   CHOL 229* 05/25/2014   TRIG 276.0* 05/25/2014   HDL 41.90 05/25/2014   LDLDIRECT 172.0 12/06/2013   LDLCALC 132* 05/25/2014   ALT 16 05/24/2014   AST 19 05/24/2014   NA 139 05/24/2014   K 3.8 05/24/2014   CL 101 05/24/2014   CREATININE 0.9 05/24/2014   BUN 26* 05/24/2014   CO2 29 05/24/2014   TSH 4.91* 05/24/2014         Assessment & Plan:

## 2014-11-17 ENCOUNTER — Telehealth: Payer: Self-pay | Admitting: Internal Medicine

## 2014-11-17 NOTE — Telephone Encounter (Signed)
emmi emailed °

## 2014-11-20 ENCOUNTER — Other Ambulatory Visit (INDEPENDENT_AMBULATORY_CARE_PROVIDER_SITE_OTHER): Payer: Medicare Other

## 2014-11-20 DIAGNOSIS — G44319 Acute post-traumatic headache, not intractable: Secondary | ICD-10-CM

## 2014-11-20 DIAGNOSIS — E785 Hyperlipidemia, unspecified: Secondary | ICD-10-CM

## 2014-11-20 LAB — TSH: TSH: 18.9 u[IU]/mL — ABNORMAL HIGH (ref 0.35–4.50)

## 2014-11-20 LAB — BASIC METABOLIC PANEL
BUN: 20 mg/dL (ref 6–23)
CO2: 28 mEq/L (ref 19–32)
Calcium: 9.4 mg/dL (ref 8.4–10.5)
Chloride: 105 mEq/L (ref 96–112)
Creatinine, Ser: 0.8 mg/dL (ref 0.4–1.2)
GFR: 74.8 mL/min (ref >60.00)
Glucose, Bld: 82 mg/dL (ref 70–99)
Potassium: 3.8 mEq/L (ref 3.5–5.1)
Sodium: 141 mEq/L (ref 135–145)

## 2014-11-20 LAB — HEPATIC FUNCTION PANEL
ALT: 16 U/L (ref 0–35)
AST: 24 U/L (ref 0–37)
Albumin: 4 g/dL (ref 3.5–5.2)
Alkaline Phosphatase: 62 U/L (ref 39–117)
BILIRUBIN TOTAL: 0.7 mg/dL (ref 0.2–1.2)
Bilirubin, Direct: 0.1 mg/dL (ref 0.0–0.3)
Total Protein: 7 g/dL (ref 6.0–8.3)

## 2014-11-20 LAB — LIPID PANEL
CHOL/HDL RATIO: 5
Cholesterol: 246 mg/dL — ABNORMAL HIGH (ref 0–200)
HDL: 45 mg/dL (ref >39.00)
LDL CALC: 169 mg/dL — AB (ref 0–99)
NonHDL: 201
Triglycerides: 161 mg/dL — ABNORMAL HIGH (ref 0.0–149.0)
VLDL: 32.2 mg/dL (ref 0.0–40.0)

## 2014-11-21 ENCOUNTER — Other Ambulatory Visit: Payer: Self-pay | Admitting: Internal Medicine

## 2014-11-21 MED ORDER — SYNTHROID 200 MCG PO TABS: 200.0000 ug | ORAL_TABLET | Freq: Every day | ORAL | Status: DC

## 2014-11-30 ENCOUNTER — Encounter: Payer: Self-pay | Admitting: Internal Medicine

## 2014-12-04 NOTE — Telephone Encounter (Signed)
Faxed report to [facility]...Johny Chess

## 2014-12-06 DIAGNOSIS — M542 Cervicalgia: Secondary | ICD-10-CM | POA: Diagnosis not present

## 2014-12-06 DIAGNOSIS — M546 Pain in thoracic spine: Secondary | ICD-10-CM | POA: Diagnosis not present

## 2014-12-06 DIAGNOSIS — M5032 Other cervical disc degeneration, mid-cervical region: Secondary | ICD-10-CM | POA: Diagnosis not present

## 2014-12-07 ENCOUNTER — Other Ambulatory Visit: Payer: Self-pay | Admitting: Physician Assistant

## 2014-12-07 DIAGNOSIS — M546 Pain in thoracic spine: Secondary | ICD-10-CM

## 2014-12-11 ENCOUNTER — Ambulatory Visit
Admission: RE | Admit: 2014-12-11 | Discharge: 2014-12-11 | Disposition: A | Discharge status: Home / Self Care | Payer: Medicare Other | Source: Ambulatory Visit | Attending: Physician Assistant | Admitting: Physician Assistant

## 2014-12-11 DIAGNOSIS — M5134 Other intervertebral disc degeneration, thoracic region: Secondary | ICD-10-CM | POA: Diagnosis not present

## 2014-12-11 DIAGNOSIS — M546 Pain in thoracic spine: Secondary | ICD-10-CM | POA: Diagnosis not present

## 2014-12-11 DIAGNOSIS — M5021 Other cervical disc displacement,  high cervical region: Secondary | ICD-10-CM | POA: Diagnosis not present

## 2014-12-11 DIAGNOSIS — M5032 Other cervical disc degeneration, mid-cervical region: Secondary | ICD-10-CM | POA: Diagnosis not present

## 2014-12-11 DIAGNOSIS — M4184 Other forms of scoliosis, thoracic region: Secondary | ICD-10-CM | POA: Diagnosis not present

## 2014-12-14 DIAGNOSIS — I712 Thoracic aortic aneurysm, without rupture: Secondary | ICD-10-CM | POA: Diagnosis not present

## 2014-12-14 DIAGNOSIS — M546 Pain in thoracic spine: Secondary | ICD-10-CM | POA: Diagnosis not present

## 2014-12-14 DIAGNOSIS — M5032 Other cervical disc degeneration, mid-cervical region: Secondary | ICD-10-CM | POA: Diagnosis not present

## 2014-12-14 DIAGNOSIS — R911 Solitary pulmonary nodule: Secondary | ICD-10-CM | POA: Diagnosis not present

## 2014-12-15 ENCOUNTER — Other Ambulatory Visit: Payer: Self-pay | Admitting: Physical Medicine and Rehabilitation

## 2014-12-15 DIAGNOSIS — R911 Solitary pulmonary nodule: Secondary | ICD-10-CM

## 2014-12-27 ENCOUNTER — Encounter: Payer: Self-pay | Admitting: Internal Medicine

## 2015-01-02 ENCOUNTER — Encounter: Payer: Self-pay | Admitting: *Deleted

## 2015-01-02 ENCOUNTER — Telehealth: Payer: Self-pay | Admitting: Internal Medicine

## 2015-01-02 DIAGNOSIS — I7121 Aneurysm of the ascending aorta, without rupture: Secondary | ICD-10-CM | POA: Insufficient documentation

## 2015-01-02 DIAGNOSIS — R911 Solitary pulmonary nodule: Secondary | ICD-10-CM | POA: Insufficient documentation

## 2015-01-02 DIAGNOSIS — I712 Thoracic aortic aneurysm, without rupture: Secondary | ICD-10-CM | POA: Insufficient documentation

## 2015-01-02 NOTE — Telephone Encounter (Signed)
Patient requested to switch pcp from dr plotnikov to dr Doug Sou. She states that there are no complaints, she would just rather have a female MD.

## 2015-01-02 NOTE — Telephone Encounter (Signed)
Called patient and set up appointment with dr Doug Sou.

## 2015-01-02 NOTE — Telephone Encounter (Signed)
Just an FYI

## 2015-01-02 NOTE — Telephone Encounter (Signed)
OK w/me Thx 

## 2015-01-03 ENCOUNTER — Encounter: Payer: Medicare Other | Admitting: Surgery

## 2015-01-03 ENCOUNTER — Ambulatory Visit
Admission: RE | Admit: 2015-01-03 | Discharge: 2015-01-03 | Disposition: A | Discharge status: Home / Self Care | Payer: Medicare Other | Source: Ambulatory Visit | Attending: Physical Medicine and Rehabilitation | Admitting: Physical Medicine and Rehabilitation

## 2015-01-03 DIAGNOSIS — R911 Solitary pulmonary nodule: Secondary | ICD-10-CM

## 2015-01-03 MED ORDER — IOHEXOL 300 MG/ML  SOLN
75.0000 mL | Freq: Once | INTRAMUSCULAR | Status: AC | PRN
Start: 2015-01-03 — End: 2015-01-03
  Administered 2015-01-03: 75 mL via INTRAVENOUS

## 2015-01-04 ENCOUNTER — Telehealth: Payer: Self-pay | Admitting: *Deleted

## 2015-01-04 ENCOUNTER — Institutional Professional Consult (permissible substitution) (INDEPENDENT_AMBULATORY_CARE_PROVIDER_SITE_OTHER): Payer: Medicare Other | Admitting: Surgery

## 2015-01-04 ENCOUNTER — Encounter: Payer: Self-pay | Admitting: Surgery

## 2015-01-04 VITALS — BP 133/85 | HR 80 | Resp 20 | Ht 60.0 in | Wt 129.0 lb

## 2015-01-04 DIAGNOSIS — I712 Thoracic aortic aneurysm, without rupture: Secondary | ICD-10-CM

## 2015-01-04 DIAGNOSIS — R911 Solitary pulmonary nodule: Secondary | ICD-10-CM

## 2015-01-04 DIAGNOSIS — I7121 Aneurysm of the ascending aorta, without rupture: Secondary | ICD-10-CM

## 2015-01-06 ENCOUNTER — Encounter: Payer: Self-pay | Admitting: Surgery

## 2015-01-06 NOTE — Progress Notes (Signed)
PCP is Walker Kehr, MD Referring Provider is Olga Millers, MD  Chief Complaint  Patient presents with  . Thoracic Aortic Aneurysm    Surgical eval on dilatation of the ascending aorta at 4.0 cm chest CT 01/03/14  . 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 She was in [place] in August 2015 visiting family and fell off a stool injuring her neck. She was recently seen by Dr. Nelva Bush' PA and had a CT of the neck and thoracic spine since she could not have an MRI due to auditory implants. The showed only degenerative spine disease but did show a 7 mm subpleural nodule in the left lower lobe of the lung and fusiform enlargement of the ascending aorta to 4 cm. It also showed three vessel coronary calcification.   Past Medical History  Diagnosis Date  . Hyperlipidemia   . Blood in stool   . Diverticulitis   . GERD (gastroesophageal reflux disease)   . Allergy     hay fever  . Hypertension   . Kidney stones   . 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 Helicobacter pylori infection   . Hypothyroidism   . Obstructive sleep apnea   . Genital herpes   . Fatty liver 05/28/10    per CT at [facility]  . Hiatal hernia 05/28/10    CT @ [facility]  . Meniere's disease   . BPPV (benign paroxysmal positional vertigo)   . Ascending aortic aneurysm   . Lung nodule seen on imaging study     Past Surgical History  Procedure Laterality Date  . Cholecystectomy  1989  . Appendectomy  1989  . Abdominal hysterectomy  1995  . Total thyroidectomy  1995  . Breast surgery  1989    breast reduction  . 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 Laparoscopic sigmoid colectomy  2012    with takedown of splenic flexure; cystoscopy with bilateral ureteral stent  placement  . Bppv      x 4 after L cochlear implant    Family History  Problem Relation Age of Onset  . Hypertension Mother   . Hypertension Father   . Hypertension Sister   . Prostate cancer Brother   . Hyperlipidemia Brother   . Colon cancer Maternal Aunt   . Prostate cancer Paternal Uncle   . Colon cancer Cousin     x 2; paternal    Social History History  Substance Use Topics  . Smoking status: Current Every Day Smoker -- 0.50 packs/day    Types: Cigarettes  . Smokeless tobacco: Never Used  . Alcohol Use: Yes     Comment: rarely    Current Outpatient Prescriptions  Medication Sig Dispense Refill  . ascorbic acid (VITAMIN C) 1000 MG tablet Take 1,000 mg by mouth daily.    . Biotin 10 MG CAPS Take 1 capsule by mouth daily.    . cholecalciferol (VITAMIN D) 1000 UNITS tablet Take 1 tablet (1,000 Units total) by mouth daily. 100 tablet 3  . lactobacillus acidophilus (BACID) TABS tablet Take 1 tablet by mouth daily. 20 billions units per day    .  latanoprost (XALATAN) 0.005 % ophthalmic solution Place 1 drop into both eyes at bedtime.    Marland Kitchen levothyroxine (SYNTHROID, LEVOTHROID) 125 MCG tablet Take 125 mcg by mouth daily before breakfast. Takes 1 and 1/2 tablet per day    . losartan-hydrochlorothiazide (HYZAAR) 50-12.5 MG per tablet Take 1 tablet by mouth daily. Takes 1/2 tablet  (50/12.5 mg) per day    . Multiple Vitamin (DAILY MULTIVITAMIN PO) Take 1 capsule by mouth daily.    . traMADol-acetaminophen (ULTRACET) 37.5-325 MG per tablet Take 0.5-1 tablets by mouth every 8 (eight) hours as needed for moderate pain or severe pain. 30 tablet 1   No current facility-administered medications for this visit.    Allergies  Allergen Reactions  . Morphine And Related     Nausea Can tolerate Dilaudid  . Statins Other (See Comments)    Joint pain     Review of Systems  Constitutional: Negative for fever, chills, diaphoresis, activity change, appetite change and fatigue.  HENT:  Positive for hearing loss.        Bilateral hearing implants in 2009 and 2012  Eyes:       Glaucoma Cataract left eye  Respiratory: Positive for apnea. Negative for shortness of breath.        CPAP at night  Cardiovascular: Negative.  Negative for chest pain and leg swelling.  Gastrointestinal: Negative.   Endocrine: Negative.   Genitourinary: Negative.   Musculoskeletal: Negative.   Allergic/Immunologic: Negative.   Neurological: Positive for numbness.       In hands when laying flat  Hematological: Negative.   Psychiatric/Behavioral: Negative.     BP 133/85 mmHg  Pulse 80  Resp 20  Ht 5' (1.524 m)  Wt 129 lb (58.514 kg)  BMI 25.19 kg/m2  SpO2 98% Physical Exam  Constitutional: She is oriented to person, place, and time. She appears well-developed and well-nourished. No distress.  HENT:  Head: Atraumatic.  Mouth/Throat: Oropharynx is clear and moist.  Eyes: EOM are normal. Pupils are equal, round, and reactive to light.  Neck: Normal range of motion. Neck supple. No JVD present. No thyromegaly present.  Cardiovascular: Regular rhythm, normal heart sounds and intact distal pulses.   No murmur heard. Pulmonary/Chest: Effort normal and breath sounds normal. No respiratory distress.  Abdominal: Soft. Bowel sounds are normal. She exhibits no distension and no mass. There is no tenderness.  Musculoskeletal: She exhibits no edema.  Lymphadenopathy:    She has no cervical adenopathy.  Neurological: She is alert and oriented to person, place, and time. She has normal strength. No sensory deficit.  Skin: Skin is warm and dry.  Psychiatric: She has a normal mood and affect.     Diagnostic Tests:  CLINICAL DATA: Fall 07/31/2014. Hand numbness and tingling. Pain.  EXAM: CT CERVICAL SPINE WITHOUT CONTRAST  CT THORACIC SPINE WITHOUT CONTRAST  TECHNIQUE: Multidetector CT imaging of the cervical and thoracic spine was performed without contrast. Multiplanar CT image  reconstructions were also generated.  COMPARISON: None.  FINDINGS: CT CERVICAL SPINE FINDINGS  No acute fracture. No dislocation.  Central disc protrusion at C3-4 effaces the anterior thecal sac. Disc bulge and posterior disc osteophyte complex is present at C4-5, C5-6, and C6-7. This results in varying degrees of central stenosis. Bilateral uncovertebral osteophytes result in right-sided foraminal narrowing at C4-5, C5-6, and C6-7. Minimal narrowing on the left.  No obvious spinal hematoma or soft tissue injury.  Postoperative changes from thyroidectomy.  CT THORACIC SPINE FINDINGS  No acute fracture.  No vertebral compression deformity. Multilevel posterior osteophytic ridging occurs throughout the thoracic spine. No obvious spinal stenosis. No obvious spinal hematoma. Slight dextroscoliosis in the mid thoracic spine may be positional.  Patchy ground-glass opacities throughout both lungs may simply represent volume loss. Noncalcified 7 mm left lower lobe pulmonary nodule on image 57.  Three vessel coronary artery calcifications. LAD coronary artery calcifications are prominent.  Ascending thoracic aorta is 4.0 cm in caliber.  IMPRESSION: No evidence of cervical spine injury.  Central C3-4 disc protrusion.  Degenerative changes involving the C4-5, C5-6, and C6-7 discs and uncovertebral joints.  No evidence of thoracic spine vertebral injury. Scattered degenerative changes are noted in the spine.  7 mm noncalcified left lower lobe pulmonary nodule. If the patient is at high risk for bronchogenic carcinoma, follow-up chest CT at 3-51months is recommended. If the patient is at low risk for bronchogenic carcinoma, follow-up chest CT at 6-12 months is recommended. This recommendation follows the consensus statement: Guidelines for Management of Small Pulmonary Nodules Detected on CT Scans: A Statement from the [employer] as published  in Radiology 2005; 237:395-400.  Mild aneurysmal dilatation of the ascending aorta at 4.0 cm.   Electronically Signed  By: Maryclare Bean M.D.  On: 12/11/2014 15:36   CLINICAL DATA: Known pulmonary nodule, followup examination, history of smoking  EXAM: CT CHEST WITH CONTRAST  TECHNIQUE: Multidetector CT imaging of the chest was performed during intravenous contrast administration.  CONTRAST: 38mL OMNIPAQUE IOHEXOL 300 MG/ML SOLN  COMPARISON: 12/11/2014  FINDINGS: The lungs are well aerated bilaterally. The previously seen 7 mm subpleural nodule is again identified in the left lower lobe posteriorly. No other parenchymal nodules are seen. No focal infiltrate effusion or pneumothorax is noted.  The hilar and mediastinal structures show no significant lymphadenopathy. Coronary calcification and aortic calcification is noted. No acute bony abnormality is noted.  IMPRESSION: Stable 7 mm nodule in the left lower lobe posteriorly. No other nodules are identified.  If the patient is at high risk for bronchogenic carcinoma, follow-up chest CT at 42months is recommended. If the patient is at low risk for bronchogenic carcinoma, follow-up chest CT at 6-12 months is recommended. This recommendation follows the consensus statement: Guidelines for Management of Small Pulmonary Nodules Detected on CT Scans: A Statement from the [employer] as published in Radiology 2005; 237:395-400.   Electronically Signed  By: Inez Catalina M.D.  On: 01/03/2015 14:06    Impression:  She has mild aneurysmal enlargement of the ascending aorta at 4 cm. I have reviewed the CT and MRA scan reports from her previous scans which she has with her. The aorta was 3.8 cm in 2006 by CT, 3.4 cm in 2008 by MRA  and 3.4 cm in 2012 by CT. It is essentially unchanged since 2006 and does not require any treatment at this time. Aneurysms of this part of the aorta don't require treatment  unless there is rapid growth of 6 mm in 1 year or if they reach 5.5 cm. She should have a follow up scan in about 2 years. The left lower lobe nodule has also been present since 2006 when it measured 49mm. It is minimally enlarged after 9 years and is smoothly marginated and almost certainly benign. She does have a long history of smoking and still smokes so she should have a low dose screening CT of the chest yearly at this time.   Plan:  I think she should have a follow up low dose CT scan of  the chest without contrast in a year. This will also show the size of the aorta adequately/. She is going to discuss this with her new primary physician. I told her that it would be fine for that physician to order the scan or else I would be happy to do it.

## 2015-01-08 ENCOUNTER — Ambulatory Visit (INDEPENDENT_AMBULATORY_CARE_PROVIDER_SITE_OTHER): Payer: Medicare Other | Admitting: Internal Medicine

## 2015-01-08 ENCOUNTER — Other Ambulatory Visit (INDEPENDENT_AMBULATORY_CARE_PROVIDER_SITE_OTHER): Payer: Medicare Other

## 2015-01-08 ENCOUNTER — Encounter: Payer: Self-pay | Admitting: Internal Medicine

## 2015-01-08 VITALS — BP 104/70 | HR 105 | Temp 98.5°F | Resp 12 | Ht 61.0 in | Wt 128.0 lb

## 2015-01-08 DIAGNOSIS — E039 Hypothyroidism, unspecified: Secondary | ICD-10-CM

## 2015-01-08 DIAGNOSIS — I712 Thoracic aortic aneurysm, without rupture: Secondary | ICD-10-CM

## 2015-01-08 DIAGNOSIS — R911 Solitary pulmonary nodule: Secondary | ICD-10-CM

## 2015-01-08 DIAGNOSIS — R159 Full incontinence of feces: Secondary | ICD-10-CM

## 2015-01-08 DIAGNOSIS — I7121 Aneurysm of the ascending aorta, without rupture: Secondary | ICD-10-CM

## 2015-01-08 LAB — TSH: TSH: 0.09 u[IU]/mL — ABNORMAL LOW (ref 0.35–4.50)

## 2015-01-08 LAB — T4, FREE: Free T4: 1.8 ng/dL — ABNORMAL HIGH (ref 0.60–1.60)

## 2015-01-08 NOTE — Progress Notes (Signed)
Pre visit review using our clinic review tool, if applicable. No additional management support is needed unless otherwise documented below in the visit note. 

## 2015-01-08 NOTE — Patient Instructions (Signed)
We will have you go down to the lab to get your blood work drawn. We will call you back and send a message on my chart about the results.  We will send you to a proctologist as well and you should hear back about scheduling that within a week.   Come back in about 6-12 months or please feel free to call sooner if you have any problems or questions.

## 2015-01-09 ENCOUNTER — Encounter: Payer: Self-pay | Admitting: Internal Medicine

## 2015-01-09 NOTE — Assessment & Plan Note (Signed)
Recheck TSH and free T4 as concern for high levels with recent dose adjustment.

## 2015-01-09 NOTE — Progress Notes (Signed)
   Subjective:    Patient ID: Rachel Gardner, female    DOB: [birth year], 69 y.o.   MRN: [id number]  HPI The patient is a 69 YO female who is coming in today to establish care. She has PMH of back pain (recent disc herniation), thoracic aneurysm (stable at recent check), HTN, hyperlipidemia, lung nodule (stable on recent imaging), stool incontinence, hypothyroidism. She has been dealing with stool incontinence after colectomy many years ago and states that originally she was offered therapy to help but was unable to do that. She wants to see a proctologist and see if there are still options or new options. Her thyroid dosing was changed recently and she is not happy with it not. She was told to take 2 pills a day and has been taking 1.5 pills daily.   Review of Systems  Constitutional: Negative for fever, activity change, appetite change, fatigue and unexpected weight change.  HENT: Negative.   Respiratory: Negative for cough, chest tightness, shortness of breath and wheezing.   Cardiovascular: Negative for chest pain, palpitations and leg swelling.  Gastrointestinal: Positive for diarrhea. Negative for abdominal pain and abdominal distention.       Incontinence  Musculoskeletal: Positive for back pain, arthralgias and neck pain.  Skin: Negative.   Neurological: Positive for tremors. Negative for dizziness, weakness, light-headedness, numbness and headaches.       Slightly shaky  Psychiatric/Behavioral: Negative.       Objective:   Physical Exam  Constitutional: She is oriented to person, place, and time. She appears well-developed and well-nourished.  HENT:  Head: Normocephalic and atraumatic.  Eyes: EOM are normal.  Neck: Normal range of motion.  Cardiovascular: Normal rate and regular rhythm.   Pulmonary/Chest: Effort normal and breath sounds normal. No respiratory distress. She has no wheezes. She has no rales.  Abdominal: Soft. Bowel sounds are normal. She exhibits no distension.  There is no tenderness. There is no rebound.  Musculoskeletal: She exhibits no edema.  Neurological: She is alert and oriented to person, place, and time. Coordination normal.  Skin: Skin is warm and dry.  Hands shaking slightly   Filed Vitals:   01/08/15 1329  BP: 104/70  Pulse: 105  Temp: 98.5 F (36.9 C)  TempSrc: Oral  Resp: 12  Height: 5\' 1"  (1.549 m)  Weight: 128 lb (58.06 kg)  SpO2: 95%      Assessment & Plan:

## 2015-01-09 NOTE — Assessment & Plan Note (Signed)
Recent imaging stable and follow up every year.

## 2015-01-09 NOTE — Assessment & Plan Note (Signed)
Stable on recent imaging and should have yearly CT chest as she is still smoking. Talked to her about cessation and she is not interested today.

## 2015-01-09 NOTE — Assessment & Plan Note (Signed)
Will refer to GI to see if there are any options. She is very careful with stool softeners as they cause accidents. She states she does not have good feeling and cannot push.

## 2015-02-13 ENCOUNTER — Other Ambulatory Visit: Payer: Medicare Other

## 2015-02-13 ENCOUNTER — Encounter: Payer: Self-pay | Admitting: Internal Medicine

## 2015-02-13 ENCOUNTER — Ambulatory Visit (INDEPENDENT_AMBULATORY_CARE_PROVIDER_SITE_OTHER): Payer: Medicare Other | Admitting: Internal Medicine

## 2015-02-13 VITALS — BP 90/64 | HR 104 | Ht 60.75 in | Wt 125.1 lb

## 2015-02-13 DIAGNOSIS — R197 Diarrhea, unspecified: Secondary | ICD-10-CM

## 2015-02-13 DIAGNOSIS — M6289 Other specified disorders of muscle: Secondary | ICD-10-CM

## 2015-02-13 DIAGNOSIS — N8184 Pelvic muscle wasting: Secondary | ICD-10-CM

## 2015-02-13 NOTE — Patient Instructions (Addendum)
You have been given a separate informational sheet regarding your tobacco use, the importance of quitting and local resources to help you quit. Your physician has requested that you go to the basement for the following lab work before leaving today: Sprue profile  We are making you an appointment with Ileana Roup, NP at [facility] for pelvic floor dysfunction.  We will be in touch with the date and time.  They are located at [address] , phone # [phone].  I appreciate the opportunity to care for you.   Ileana Roup, NP, [facility], [address]

## 2015-02-13 NOTE — Progress Notes (Signed)
Rachel Gardner 1946-09-05 [id number]  Note: This dictation was prepared with Dragon digital system. Any transcriptional errors that result from this procedure are unintentional.   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 She is here today for evaluation of pelvic floor dysfunction characterized by occasional stool incontinence and difficult evacuation. She had a sigmoid resection for diverticulitis in 2012 and her last colonoscopy was in January 2012 which showed normal anatomy. There is a positive family history of colon cancer in maternal uncle. Patient underwent laparoscopic cholecystectomy 1989. She was treated for H. pylori in 1995. She has been on self-imposed gluten-free diet .  Recent CT scan of the chest showed widening of the thoracic aorta to diameter of 4 cm consistent  with early aneurysm. Patient is quite concerned about her having to strain for bowel movement while having an aneurysm and therefore would like to  evaluate. pelvic floor dysfunction to see if she can learn exercises to prevent straining    Past Medical History  Diagnosis Date  . Hyperlipidemia   . Blood in stool   . Diverticulitis   . GERD (gastroesophageal reflux disease)   . Allergy     hay fever  . Hypertension   . Kidney stones   . 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 Helicobacter pylori infection   . Hypothyroidism   . Obstructive sleep apnea   . Genital herpes   . Fatty liver 05/28/10    per [facility] at [facility]  . Hiatal hernia 05/28/10    CT @ [facility]  . Meniere's disease   . BPPV (benign paroxysmal positional vertigo)   . Ascending aortic aneurysm   . Lung nodule seen on imaging study   . Glaucoma     Past Surgical History  Procedure Laterality Date  . Cholecystectomy  1989  . Appendectomy  1989  . Abdominal hysterectomy  1995  . Total thyroidectomy  1995  . Breast surgery  1989   breast reduction  . 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 Laparoscopic sigmoid colectomy  2012    with takedown of splenic flexure; cystoscopy with bilateral ureteral stent placement  . 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 Morphine And Related     Nausea Can tolerate Dilaudid  . Statins Other (See Comments)    Joint pain     Family history and social history have been reviewed.  Review of Systems: Alternating diarrhea constipation. Usually under good control on gluten-free diet, weight stable. Denies rectal bleeding  The remainder of the 10 point ROS is negative except as outlined in the H&P  Physical Exam: General Appearance Well developed, in no distress Eyes  Non icteric  HEENT  Non traumatic, normocephalic  Mouth No lesion, tongue papillated, no cheilosis Neck Supple without adenopathy, thyroid not enlarged, no carotid bruits, no JVD Lungs Clear to auscultation bilaterally COR Normal S1, normal S2, regular rhythm, no murmur, quiet precordium Abdomen soft nontender Rectal normal perianal area. Normal rectal sphincter tone. Adequate squeeze of the rectal muscle. Small amount of Hemoccult negative stool in the ampulla. There is no prolapse of rectal mucosa Extremities  No pedal edema Skin No lesions Neurological Alert and oriented x 3 Psychological Normal mood and affect  Assessment and Plan:   69 year old female with pelvic floor  dysfunction alternating poor evacuation, constipation and diarrhea, she denies any specific urinary symptoms although often she has to urinate when she is straining for bowel movement. We will make a referral to pelvic floor dysfunction clinic to assess and treat  History of sigmoid diverticulitis. Segmental resection in 2012 A ascending thoracic aorta aneurysm of 4 cm by recent CT scan of the chest    Delfin Edis 02/13/2015

## 2015-02-14 LAB — GLIA (IGA/G) + TTG IGA
Gliadin IgA: 4 Units (ref <20)
Gliadin IgG: 2 Units (ref <20)
TISSUE TRANSGLUTAMINASE AB, IGA: 1 U/mL (ref <4)

## 2015-04-24 ENCOUNTER — Ambulatory Visit: Payer: Medicare Other

## 2015-04-25 ENCOUNTER — Ambulatory Visit (INDEPENDENT_AMBULATORY_CARE_PROVIDER_SITE_OTHER): Payer: Medicare Other

## 2015-04-25 VITALS — BP 120/84 | Ht 61.5 in | Wt 127.2 lb

## 2015-04-25 DIAGNOSIS — Z0000 Encounter for general adult medical examination without abnormal findings: Secondary | ICD-10-CM | POA: Diagnosis not present

## 2015-04-25 NOTE — Progress Notes (Signed)
Subjective:   Rachel Gardner is a 69 y.o. female who presents for Medicare Annual (Subsequent) preventive examination.  Review of Systems: Reviewed problem list  Cardiac Risk Factors include: advanced age (>23men, >52 women);hypertension;smoking/ tobacco exposure (Is thinking about quitting smoking)  Discussed smoking cessation x 20 minutes; Very active, but is pre-comtemplative; HRA assessment completed during visit  Current Exercise: stays busy; walks the dog; works with several Armed forces operational officer; walks/runs 5 miles on Saturday  Current dietary; states she eats well; native food  Psychosocial changes in the last year; moves; losses of family; No; children live near her  Vision: biannually  Dental: has this done q year   Generalized Safety in the home reviewed   Assessed for community safety Emergency Plan for illness or other   Current Care Team reviewed and updated     Objective:     Vitals: BP 120/84 mmHg  Ht 5' 1.5" (1.562 m)  Wt 127 lb 4 oz (57.72 kg)  BMI 23.66 kg/m2  Tobacco History  Smoking status  . Current Every Day Smoker -- 0.50 packs/day for 41 years  . Types: Cigarettes  Smokeless tobacco  . Never Used    Comment: desires to quit; but has not been successful     Ready to quit: Not Answered Counseling given: Not Answered  Had CT; has small nodule in left lung the doctor is monitoring; apprx 13mm with very little change    Past Medical History  Diagnosis Date  . Hyperlipidemia   . Blood in stool   . Diverticulitis   . GERD (gastroesophageal reflux disease)   . Allergy     hay fever  . Hypertension   . Kidney stones   . 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 Helicobacter pylori infection   . Hypothyroidism   . Obstructive sleep apnea   . Genital herpes   . Fatty liver 05/28/10    per CT at [facility]  . Hiatal hernia 05/28/10    CT @ [facility]  . Meniere's disease   . BPPV (benign paroxysmal positional  vertigo)   . Ascending aortic aneurysm   . Lung nodule seen on imaging study   . Glaucoma    Past Surgical History  Procedure Laterality Date  . Cholecystectomy  1989  . Appendectomy  1989  . Abdominal hysterectomy  1995  . Total thyroidectomy  1995  . Breast surgery  1989    breast reduction  . 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 Laparoscopic sigmoid colectomy  2012    with takedown of splenic flexure; cystoscopy with bilateral ureteral stent placement  . Bppv      x 4 after L cochlear implant   Family History  Problem Relation Age of Onset  . Hypertension Mother   . Hypertension Father   . Hypertension Sister   . Prostate cancer Brother   . Hyperlipidemia Brother   . Colon cancer Maternal Aunt   . Prostate cancer Paternal Uncle   . Colon cancer Cousin     x 2; paternal   History  Sexual Activity  . Sexual Activity: No    Outpatient Encounter Prescriptions as of 04/25/2015  Medication Sig  . ascorbic acid (VITAMIN C) 1000 MG tablet Take 1,000 mg by mouth daily.  Marland Kitchen b complex vitamins tablet Take 1 tablet by mouth daily.  . bimatoprost (LUMIGAN) 0.01 % SOLN Place 1 drop into both eyes at bedtime.  Marland Kitchen  Biotin 10 MG CAPS Take 1 capsule by mouth daily.  . cholecalciferol (VITAMIN D) 1000 UNITS tablet Take 1 tablet (1,000 Units total) by mouth daily.  Marland Kitchen lactobacillus acidophilus (BACID) TABS tablet Take 1 tablet by mouth daily. 20 billions units per day  . levothyroxine (SYNTHROID, LEVOTHROID) 125 MCG tablet Take 125 mcg by mouth daily before breakfast. Takes 1 and 1/2 tablet per day  . losartan-hydrochlorothiazide (HYZAAR) 100-25 MG per tablet Take 0.5 tablets by mouth daily.   . Omega-3 Fatty Acids (FISH OIL PO) Take 1 tablet by mouth daily.  . [DISCONTINUED] latanoprost (XALATAN) 0.005 % ophthalmic solution Place 1 drop into both eyes at bedtime.    Activities of Daily Living In your present state of health, do you have any difficulty performing the following  activities: 04/25/2015  Hearing? Y  Vision? N  Difficulty concentrating or making decisions? N  Walking or climbing stairs? N  Dressing or bathing? N  Doing errands, shopping? N  Preparing Food and eating ? N  Using the Toilet? N  In the past six months, have you accidently leaked urine? N  Do you have problems with loss of bowel control? N  Managing your Medications? N  Managing your Finances? N  Housekeeping or managing your Housekeeping? N    Patient Care Team: Cassandria Anger, MD as PCP - General (Internal Medicine)    Assessment:     Personalized Education given regarding: smoking cessation and exercise tiw for upper body; Pt determined a personalized goal; will continue to stay active with community involvement   Fall Risk and general Safety reviewed; agreed to continue strengthening upper body as instructed by pt Assess fear of falling? no Educated on prevention falls; Exercise, toning and strengthening; Balance exercises  Regular vision checks Home safety; removal of throw rugs; bathroom handrails;  Smoke detectors; safe environment/  Bone density scan as appropriate Calcium and Vit D as appropriate Any changes in medication   Stress; some stress but manages;   CC risk osteo: at risk, small bones; smokes  Risk for hepatitis or high risk social behavior: no risk but [place] go [place] to [place] every year. No sexual activity;   Risk for Depression: no  Risk for Falls: no  Safety assessed; no issue  Cognition assessed by AD8; score 0 (A score of 2 or greater would indicate the MMSE be completed)    Need for Immunizations or other screenings identified;  (CDC recommmend Prevnar at 65 followed by pnuemovax 23 in one year or 5 years after the last dose.  Health Maintenance up to date and a Preventive Wellness Plan was given to the patient  Exercise Activities and Dietary recommendations Current Exercise Habits:: Home exercise routine, Time (Minutes): 30  (walks and stays active every day), Frequency (Times/Week): 5 (walk dogs; active in community; walk on sat for 5 miles), Weekly Exercise (Minutes/Week): 150  Goals    . Exercise 3x per week (30 min per time)     Keeps walking and staying healthy; Wants to maintain her health Will continue to do arm band stretches 3 x a week       Fall Risk Fall Risk  04/25/2015  Falls in the past year? Yes  Number falls in past yr: 1  Injury with Fall? No   Depression Screen PHQ 2/9 Scores 04/25/2015  PHQ - 2 Score 0     Cognitive Testing MMSE - Mini Mental State Exam 04/25/2015  Not completed: Unable to complete  Immunization History  Administered Date(s) Administered  . Influenza,inj,Quad PF,36+ Mos 10/03/2013, 11/16/2014  . Pneumococcal Conjugate-13 10/03/2013  . Pneumococcal Polysaccharide-23 04/28/2008  . Td 08/09/2014   Screening Tests Health Maintenance  Topic Date Due  . ZOSTAVAX  03/08/2007  . DEXA SCAN  03/07/2012  . PNA vac Low Risk Adult (2 of 2 - PPSV23) 10/03/2014  . INFLUENZA VACCINE  07/30/2015  . MAMMOGRAM  09/18/2016  . COLONOSCOPY  01/27/2021  . TETANUS/TDAP  08/09/2024      Plan:  Plan   The patient agrees to: read information from the CDC regarding pneumovax 23 - 5 years post receipt of pneumovax after 65; will read and agree to take vaccine at her next office visit.   Advanced directive: Plans to bring a copy of AD to the office at the next visit  Discuss with Doctor on next fup: GYN issues To have mammogram in the fall, also pap in the fall., New med for Glaucoma which is controlling glaucoma at present  Commit to Goals set; Wants to continue to be health; agrees that smoking is an issue, desires to quit; Agreed to receipt of smoking cessation information  Will check with her insurance regarding shingles vaccine; but also had questions to ask the doctor   During the course of the visit the patient was educated and counseled about the following  appropriate screening and preventive services:   Vaccines to include Pneumoccal, Influenza, Hepatitis B, Td, Zostavax, HCV/ no hepatitis risk at this time  Electrocardiogram- deferred  Cardiovascular Disease: Does have AA (ascending) which is monitored by cardiology  Colorectal cancer screening; had bowel surgery;   Bone density screening to schedule at this visit  Diabetes screening/ n/a  Glaucoma screening ; completed  Mammography/PAP To be done this fall  Nutrition counseling / deferred BMI normal  Patient Instructions (the written plan) was given to the patient. regarding smoking cessation Nevae Pinnix, RN  04/25/2015

## 2015-04-25 NOTE — Patient Instructions (Signed)
Ms. Racicot , Thank you for taking time to come for your Medicare Wellness Visit. I appreciate your ongoing commitment to your health goals. Please review the following plan we discussed and let me know if I can assist you in the future.   These are the goals we discussed: Goals    . Exercise 3x per week (30 min per time)     Keeps walking and staying healthy; Wants to maintain her health Will continue to do arm band stretches 3 x a week        This is a list of the screening recommended for you and due dates:  Health Maintenance  Topic Date Due  . Shingles Vaccine  03/08/2007  . DEXA scan (bone density measurement)  03/07/2012  . Pneumonia vaccines (2 of 2 - PPSV23) 10/03/2014  . Flu Shot  07/30/2015  . Mammogram  09/18/2016  . Colon Cancer Screening  01/27/2021  . Tetanus Vaccine  08/09/2024   Plan Will fup on shingles cost with insurance company Will schedule dexa scan today Will transfer care to Dr. Doug Sou due to female doctor Discussed Pneum 23; will read the CDC literature and discuss with the doctor at the next visit. Mammgram and PAP planned for the fall.  Smoking;  Educated to avoid secondary smoke Smoking cessation at [facility]: [phone] [place] quit line/1-800-QUIT-NOW (603)580-1862).  30 pack yr smoking hx: Educated regarding LDCT; To discuss with MD at next fup. Also educated on AAA screening for men 65-75 who have smoked  Smoking Cessation Quitting smoking is important to your health and has many advantages. However, it is not always easy to quit since nicotine is a very addictive drug. Oftentimes, people try 3 times or more before being able to quit. This document explains the best ways for you to prepare to quit smoking. Quitting takes hard work and a lot of effort, but you can do it. ADVANTAGES OF QUITTING SMOKING  You will live longer, feel better, and live better.  Your body will feel the impact of quitting smoking almost immediately.  Within 20 minutes,  blood pressure decreases. Your pulse returns to its normal level.  After 8 hours, carbon monoxide levels in the blood return to normal. Your oxygen level increases.  After 24 hours, the chance of having a heart attack starts to decrease. Your breath, hair, and body stop smelling like smoke.  After 48 hours, damaged nerve endings begin to recover. Your sense of taste and smell improve.  After 72 hours, the body is virtually free of nicotine. Your bronchial tubes relax and breathing becomes easier.  After 2 to 12 weeks, lungs can hold more air. Exercise becomes easier and circulation improves.  The risk of having a heart attack, stroke, cancer, or lung disease is greatly reduced.  After 1 year, the risk of coronary heart disease is cut in half.  After 5 years, the risk of stroke falls to the same as a nonsmoker.  After 10 years, the risk of lung cancer is cut in half and the risk of other cancers decreases significantly.  After 15 years, the risk of coronary heart disease drops, usually to the level of a nonsmoker.  If you are pregnant, quitting smoking will improve your chances of having a healthy baby.  The people you live with, especially any children, will be healthier.  You will have extra money to spend on things other than cigarettes. QUESTIONS TO THINK ABOUT BEFORE ATTEMPTING TO QUIT You may want to talk about  your answers with your health care provider.  Why do you want to quit?  If you tried to quit in the past, what helped and what did not?  What will be the most difficult situations for you after you quit? How will you plan to handle them?  Who can help you through the tough times? Your family? Friends? A health care provider?  What pleasures do you get from smoking? What ways can you still get pleasure if you quit? Here are some questions to ask your health care provider:  How can you help me to be successful at quitting?  What medicine do you think would be best  for me and how should I take it?  What should I do if I need more help?  What is smoking withdrawal like? How can I get information on withdrawal? GET READY  Set a quit date.  Change your environment by getting rid of all cigarettes, ashtrays, matches, and lighters in your home, car, or work. Do not let people smoke in your home.  Review your past attempts to quit. Think about what worked and what did not. GET SUPPORT AND ENCOURAGEMENT You have a better chance of being successful if you have help. You can get support in many ways.  Tell your family, friends, and coworkers that you are going to quit and need their support. Ask them not to smoke around you.  Get individual, group, or telephone counseling and support. Programs are available at [employer] and health centers. Call your local health department for information about programs in your area.  Spiritual beliefs and practices may help some smokers quit.  Download a "quit meter" on your computer to keep track of quit statistics, such as how long you have gone without smoking, cigarettes not smoked, and money saved.  Get a self-help book about quitting smoking and staying off tobacco. [place] yourself from urges to smoke. Talk to someone, go for a walk, or occupy your time with a task.  Change your normal routine. Take a different route to work. Drink tea instead of coffee. Eat breakfast in a different place.  Reduce your stress. Take a hot bath, exercise, or read a book.  Plan something enjoyable to do every day. Reward yourself for not smoking.  Explore interactive web-based programs that specialize in helping you quit. GET MEDICINE AND USE IT CORRECTLY Medicines can help you stop smoking and decrease the urge to smoke. Combining medicine with the above behavioral methods and support can greatly increase your chances of successfully quitting smoking.  Nicotine replacement therapy helps  deliver nicotine to your body without the negative effects and risks of smoking. Nicotine replacement therapy includes nicotine gum, lozenges, inhalers, nasal sprays, and skin patches. Some may be available over-the-counter and others require a prescription.  Antidepressant medicine helps people abstain from smoking, but how this works is unknown. This medicine is available by prescription.  Nicotinic receptor partial agonist medicine simulates the effect of nicotine in your brain. This medicine is available by prescription. Ask your health care provider for advice about which medicines to use and how to use them based on your health history. Your health care provider will tell you what side effects to look out for if you choose to be on a medicine or therapy. Carefully read the information on the package. Do not use any other product containing nicotine while using a nicotine replacement product.  RELAPSE OR DIFFICULT SITUATIONS Most relapses  occur within the first 3 months after quitting. Do not be discouraged if you start smoking again. Remember, most people try several times before finally quitting. You may have symptoms of withdrawal because your body is used to nicotine. You may crave cigarettes, be irritable, feel very hungry, cough often, get headaches, or have difficulty concentrating. The withdrawal symptoms are only temporary. They are strongest when you first quit, but they will go away within 10-14 days. To reduce the chances of relapse, try to:  Avoid drinking alcohol. Drinking lowers your chances of successfully quitting.  Reduce the amount of caffeine you consume. Once you quit smoking, the amount of caffeine in your body increases and can give you symptoms, such as a rapid heartbeat, sweating, and anxiety.  Avoid smokers because they can make you want to smoke.  Do not let weight gain distract you. Many smokers will gain weight when they quit, usually less than 10 pounds. Eat a  healthy diet and stay active. You can always lose the weight gained after you quit.  Find ways to improve your mood other than smoking. FOR MORE INFORMATION  www.smokefree.gov  Document Released: 12/09/2001 Document Revised: 05/01/2014 Document Reviewed: 03/25/2012 [facility] Patient Information 2015 [place], [place]. This information is not intended to replace advice given to you by your health care provider. Make sure you discuss any questions you have with your health care provider.

## 2015-04-27 NOTE — Progress Notes (Signed)
I have personally reviewed the assessment and plan done by Wynetta Fines and agree with it. She will bring advanced directive copy for our office to have at next visit.

## 2015-05-01 ENCOUNTER — Ambulatory Visit (INDEPENDENT_AMBULATORY_CARE_PROVIDER_SITE_OTHER)
Admission: RE | Admit: 2015-05-01 | Discharge: 2015-05-01 | Disposition: A | Discharge status: Home / Self Care | Payer: Medicare Other | Source: Ambulatory Visit | Attending: Internal Medicine | Admitting: Internal Medicine

## 2015-05-01 DIAGNOSIS — Z13820 Encounter for screening for osteoporosis: Secondary | ICD-10-CM | POA: Diagnosis not present

## 2015-05-01 DIAGNOSIS — Z0000 Encounter for general adult medical examination without abnormal findings: Secondary | ICD-10-CM

## 2015-05-04 ENCOUNTER — Encounter: Payer: Self-pay | Admitting: Internal Medicine

## 2015-05-04 NOTE — Progress Notes (Signed)
Patient ID: Rachel Gardner, female   DOB: 16-Sep-1946, 69 y.o.   MRN: [id number] Patient has appointment for pelvic floor P.T. With Beth at [facility] on 05/09/15.

## 2015-06-20 ENCOUNTER — Encounter: Payer: Self-pay | Admitting: Internal Medicine

## 2015-06-20 DIAGNOSIS — E039 Hypothyroidism, unspecified: Secondary | ICD-10-CM

## 2015-06-28 ENCOUNTER — Encounter: Payer: Self-pay | Admitting: Internal Medicine

## 2015-06-29 ENCOUNTER — Encounter: Payer: Self-pay | Admitting: Internal Medicine

## 2015-07-10 ENCOUNTER — Encounter: Payer: Self-pay | Admitting: Internal Medicine

## 2015-07-10 ENCOUNTER — Ambulatory Visit (INDEPENDENT_AMBULATORY_CARE_PROVIDER_SITE_OTHER): Payer: Medicare Other | Admitting: Internal Medicine

## 2015-07-10 ENCOUNTER — Other Ambulatory Visit (INDEPENDENT_AMBULATORY_CARE_PROVIDER_SITE_OTHER): Payer: Medicare Other

## 2015-07-10 VITALS — BP 142/90 | HR 88 | Temp 98.3°F | Resp 16 | Wt 127.0 lb

## 2015-07-10 DIAGNOSIS — E039 Hypothyroidism, unspecified: Secondary | ICD-10-CM

## 2015-07-10 DIAGNOSIS — E038 Other specified hypothyroidism: Secondary | ICD-10-CM

## 2015-07-10 DIAGNOSIS — B349 Viral infection, unspecified: Secondary | ICD-10-CM

## 2015-07-10 DIAGNOSIS — R252 Cramp and spasm: Secondary | ICD-10-CM | POA: Diagnosis not present

## 2015-07-10 LAB — TSH: TSH: 0.59 u[IU]/mL (ref 0.35–4.50)

## 2015-07-10 LAB — T4, FREE: Free T4: 1.31 ng/dL (ref 0.60–1.60)

## 2015-07-10 LAB — MAGNESIUM: Magnesium: 2 mg/dL (ref 1.5–2.5)

## 2015-07-10 NOTE — Progress Notes (Signed)
Pre visit review using our clinic review tool, if applicable. No additional management support is needed unless otherwise documented below in the visit note. 

## 2015-07-10 NOTE — Patient Instructions (Signed)
We will check on the labs today and call you back with the results.   We have made copies of your labs from April.   The gland in your neck was likely from a cold or virus and is now going away.   We will get you in with the endocrine doctor here sooner than October.

## 2015-07-10 NOTE — Assessment & Plan Note (Signed)
At this time resolved and patient reassured that there is nothing on exam worrisome. No indication for any treatment or workup at this time.

## 2015-07-10 NOTE — Assessment & Plan Note (Signed)
Checking free T4 and TSH, referral placed for [facility] endocrinology. Continue synthroid 125 mcg daily unless indication for change.

## 2015-07-10 NOTE — Progress Notes (Signed)
   Subjective:    Patient ID: Rachel Gardner, female    DOB: 09-15-1946, 69 y.o.   MRN: [id number]  HPI The patient is a 69 YO female coming in to check on resolution of a lymph node. She noticed it about 2 weeks ago when she was sick with a virus. She has since recovered from that and has noticed the spot is shrinking slightly every day. No fevers or chills. She is feeling back to normal. She would also like to follow up on her thyroid since she was not able to get an appointment with endocrine until October and is not satisfied with that. She wants to see a different one that she feels will be more available to her.   Review of Systems  Constitutional: Negative for fever, activity change, appetite change, fatigue and unexpected weight change.  HENT: Negative.   Respiratory: Negative for cough, chest tightness, shortness of breath and wheezing.   Cardiovascular: Negative for chest pain, palpitations and leg swelling.  Gastrointestinal: Negative for abdominal pain and abdominal distention.  Musculoskeletal: Positive for back pain, arthralgias and neck pain.  Neurological: Positive for tremors. Negative for dizziness, weakness, light-headedness, numbness and headaches.       Slightly shaky  Psychiatric/Behavioral: Negative.       Objective:   Physical Exam  Constitutional: She is oriented to person, place, and time. She appears well-developed and well-nourished.  HENT:  Head: Normocephalic and atraumatic.  Eyes: EOM are normal.  Neck: Normal range of motion.  No appreciable LN in the neck or other masses.   Cardiovascular: Normal rate and regular rhythm.   Pulmonary/Chest: Effort normal and breath sounds normal. No respiratory distress. She has no wheezes. She has no rales.  Abdominal: Soft. Bowel sounds are normal. She exhibits no distension. There is no tenderness. There is no rebound.  Musculoskeletal: She exhibits no edema.  Lymphadenopathy:    She has no cervical adenopathy.    Neurological: She is alert and oriented to person, place, and time. Coordination normal.  Skin: Skin is warm and dry.  Hands shaking slightly   Filed Vitals:   07/10/15 0812  BP: 142/90  Pulse: 88  Temp: 98.3 F (36.8 C)  TempSrc: Oral  Resp: 16  Weight: 127 lb (57.607 kg)  SpO2: 96%      Assessment & Plan:

## 2015-07-11 ENCOUNTER — Encounter: Payer: Self-pay | Admitting: Internal Medicine

## 2015-08-14 ENCOUNTER — Encounter: Payer: Self-pay | Admitting: Endocrinology

## 2015-08-14 ENCOUNTER — Ambulatory Visit (INDEPENDENT_AMBULATORY_CARE_PROVIDER_SITE_OTHER): Payer: Medicare Other | Admitting: Endocrinology

## 2015-08-14 VITALS — BP 126/84 | HR 89 | Temp 98.1°F | Ht 61.5 in | Wt 127.0 lb

## 2015-08-14 DIAGNOSIS — E039 Hypothyroidism, unspecified: Secondary | ICD-10-CM | POA: Diagnosis not present

## 2015-08-14 NOTE — Patient Instructions (Signed)
Please continue the same thyroid pill.  i agree that it is possible that another product may have interfered with your body's absorption of the thyroid pill.  Over time, the thyroid sometimes grows back.  If this happens, you would notice that you need less medication. Please come back for a follow-up appointment in 3 months.       Levothyroxine tablets What is this medicine? LEVOTHYROXINE (lee voe thye ROX een) is a thyroid hormone. This medicine can improve symptoms of thyroid deficiency such as slow speech, lack of energy, weight gain, hair loss, dry skin, and feeling cold. It also helps to treat goiter (an enlarged thyroid gland). It is also used to treat some kinds of thyroid cancer along with surgery and other medicines. This medicine may be used for other purposes; ask your health care provider or pharmacist if you have questions. COMMON BRAND NAME(S): Estre, Levo-T, Levothroid, Levoxyl, Synthroid, Thyro-Tabs, Unithroid What should I tell my health care provider before I take this medicine? They need to know if you have any of these conditions: -angina -blood clotting problems -diabetes -dieting or on a weight loss program -fertility problems -heart disease -high levels of thyroid hormone -pituitary gland problem -previous heart attack -an unusual or allergic reaction to levothyroxine, thyroid hormones, other medicines, foods, dyes, or preservatives -pregnant or trying to get pregnant -breast-feeding How should I use this medicine? Take this medicine by mouth with plenty of water. It is best to take on an empty stomach, at least 30 minutes before or 2 hours after food. Follow the directions on the prescription label. Take at the same time each day. Do not take your medicine more often than directed. Contact your pediatrician regarding the use of this medicine in children. While this drug may be prescribed for children and infants as young as a few days of age for selected  conditions, precautions do apply. For infants, you may crush the tablet and place in a small amount of (5-10 ml or 1 to 2 teaspoonfuls) of water, breast milk, or non-soy based infant formula. Do not mix with soy-based infant formula. Give as directed. Overdosage: If you think you have taken too much of this medicine contact a poison control center or emergency room at once. NOTE: This medicine is only for you. Do not share this medicine with others. What if I miss a dose? If you miss a dose, take it as soon as you can. If it is almost time for your next dose, take only that dose. Do not take double or extra doses. What may interact with this medicine? -amiodarone -antacids -anti-thyroid medicines -calcium supplements -carbamazepine -cholestyramine -colestipol -digoxin -female hormones, including contraceptive or birth control pills -iron supplements -ketamine -liquid nutrition products like Ensure -medicines for colds and breathing difficulties -medicines for diabetes -medicines for mental depression -medicines or herbals used to decrease weight or appetite -phenobarbital or other barbiturate medications -phenytoin -prednisone or other corticosteroids -rifabutin -rifampin -soy isoflavones -sucralfate -theophylline -warfarin This list may not describe all possible interactions. Give your health care provider a list of all the medicines, herbs, non-prescription drugs, or dietary supplements you use. Also tell them if you smoke, drink alcohol, or use illegal drugs. Some items may interact with your medicine. What should I watch for while using this medicine? Be sure to take this medicine with plenty of fluids. Some tablets may cause choking, gagging, or difficulty swallowing from the tablet getting stuck in your throat. Most of these problems disappear if the medicine  is taken with the right amount of water or other fluids. Do not switch brands of this medicine unless your health care  professional agrees with the change. Ask questions if you are uncertain. You will need regular exams and occasional blood tests to check the response to treatment. If you are receiving this medicine for an underactive thyroid, it may be several weeks before you notice an improvement. Check with your doctor or health care professional if your symptoms do not improve. It may be necessary for you to take this medicine for the rest of your life. Do not stop using this medicine unless your doctor or health care professional advises you to. This medicine can affect blood sugar levels. If you have diabetes, check your blood sugar as directed. You may lose some of your hair when you first start treatment. With time, this usually corrects itself. If you are going to have surgery, tell your doctor or health care professional that you are taking this medicine. What side effects may I notice from receiving this medicine? Side effects that you should report to your doctor or health care professional as soon as possible: -allergic reactions like skin rash, itching or hives, swelling of the face, lips, or tongue -chest pain -excessive sweating or intolerance to heat -fast or irregular heartbeat -nervousness -skin rash or hives -swelling of ankles, feet, or legs -tremors Side effects that usually do not require medical attention (report to your doctor or health care professional if they continue or are bothersome): -changes in appetite -changes in menstrual periods -diarrhea -hair loss -headache -trouble sleeping -weight loss This list may not describe all possible side effects. Call your doctor for medical advice about side effects. You may report side effects to FDA at 1-800-FDA-1088. Where should I keep my medicine? Keep out of the reach of children. Store at room temperature between 15 and 30 degrees C (59 and 86 degrees F). Protect from light and moisture. Keep container tightly closed. Throw away  any unused medicine after the expiration date. NOTE: This sheet is a summary. It may not cover all possible information. If you have questions about this medicine, talk to your doctor, pharmacist, or health care provider.  2015, [employer] Standard. (2009-03-23 14:28:07)

## 2015-08-14 NOTE — Progress Notes (Signed)
Subjective:    Patient ID: Rachel Gardner, female    DOB: 03-Sep-1946, 69 y.o.   MRN: [id number]  HPI Pt had thyroidectomy in 2005, for a goiter--was benign on pathology.  she has been on prescribed thyroid hormone therapy since then.  she has never taken kelp or any other type of non-prescribed thyroid product.  she has never had thyroid surgery, or XRT to the neck.   In 2015, she was taking 125 mcg/d, and TSH was 18 (she had generalized myalgias, and assoc cold intolerance).  She increased to 1 1/2 of 125 mcg/day. In jan of 2016, TSH was 0.09.  Synthroid was decreased back to 125/d.  In may of 2016, TSH in [place] was 0.32 (again on 125/d).  Most recent in July of 2016 was 0.59.  She wonders if taking supplemements along with synthroid could affect absorption.  She feels better since blood test has normalized.     Review of Systems denies depression, hair loss, sob, fever, constipation, numbness, blurry vision, menopausal sxs, rhinorrhea, easy bruising, and syncope.  She has leg cramps at night.  She has dry skin.      Objective:   Physical Exam VS: see vs page GEN: no distress HEAD: head: no deformity eyes: no periorbital swelling, no proptosis external nose and ears are normal mouth: no lesion seen NECK: a healed scar is present.  i do not appreciate a nodule in the thyroid or elsewhere in the neck CHEST WALL: no deformity LUNGS:  Clear to auscultation CV: reg rate and rhythm, no murmur ABD: abdomen is soft, nontender.  no hepatosplenomegaly.  not distended.  no hernia MUSCULOSKELETAL: muscle bulk and strength are grossly normal.  no obvious joint swelling.  gait is normal and steady EXTEMITIES: no deformity.  no ulcer on the feet.  feet are of normal color and temp.  no edema PULSES: dorsalis pedis intact bilat.  no carotid bruit NEURO:  cn 2-12 grossly intact.   readily moves all 4's.  sensation is intact to touch on the feet SKIN:  Normal texture and temperature.  No rash or  suspicious lesion is visible.   NODES:  None palpable at the neck PSYCH: alert, well-oriented.  Does not appear anxious nor depressed.   Lab Results  Component Value Date   TSH 0.59 07/10/2015  i personally reviewed electrocardiogram tracing (01/14/14): normal    Assessment & Plan:  Hypothyroidism: new to me, well-replaced. As she has had slightly suppressed TSH on this dosage of synthroid, she may have a decreasing requirement, though.  The reason for the variable TSH is unclear, but it absorption may have been interfered with by supplement.    Patient is advised the following: Patient Instructions  Please continue the same thyroid pill.  i agree that it is possible that another product may have interfered with your body's absorption of the thyroid pill.  Over time, the thyroid sometimes grows back.  If this happens, you would notice that you need less medication. Please come back for a follow-up appointment in 3 months.       Levothyroxine tablets What is this medicine? LEVOTHYROXINE (lee voe thye ROX een) is a thyroid hormone. This medicine can improve symptoms of thyroid deficiency such as slow speech, lack of energy, weight gain, hair loss, dry skin, and feeling cold. It also helps to treat goiter (an enlarged thyroid gland). It is also used to treat some kinds of thyroid cancer along with surgery and other medicines. This medicine  may be used for other purposes; ask your health care provider or pharmacist if you have questions. COMMON BRAND NAME(S): Estre, Levo-T, Levothroid, Levoxyl, Synthroid, Thyro-Tabs, Unithroid What should I tell my health care provider before I take this medicine? They need to know if you have any of these conditions: -angina -blood clotting problems -diabetes -dieting or on a weight loss program -fertility problems -heart disease -high levels of thyroid hormone -pituitary gland problem -previous heart attack -an unusual or allergic reaction to  levothyroxine, thyroid hormones, other medicines, foods, dyes, or preservatives -pregnant or trying to get pregnant -breast-feeding How should I use this medicine? Take this medicine by mouth with plenty of water. It is best to take on an empty stomach, at least 30 minutes before or 2 hours after food. Follow the directions on the prescription label. Take at the same time each day. Do not take your medicine more often than directed. Contact your pediatrician regarding the use of this medicine in children. While this drug may be prescribed for children and infants as young as a few days of age for selected conditions, precautions do apply. For infants, you may crush the tablet and place in a small amount of (5-10 ml or 1 to 2 teaspoonfuls) of water, breast milk, or non-soy based infant formula. Do not mix with soy-based infant formula. Give as directed. Overdosage: If you think you have taken too much of this medicine contact a poison control center or emergency room at once. NOTE: This medicine is only for you. Do not share this medicine with others. What if I miss a dose? If you miss a dose, take it as soon as you can. If it is almost time for your next dose, take only that dose. Do not take double or extra doses. What may interact with this medicine? -amiodarone -antacids -anti-thyroid medicines -calcium supplements -carbamazepine -cholestyramine -colestipol -digoxin -female hormones, including contraceptive or birth control pills -iron supplements -ketamine -liquid nutrition products like Ensure -medicines for colds and breathing difficulties -medicines for diabetes -medicines for mental depression -medicines or herbals used to decrease weight or appetite -phenobarbital or other barbiturate medications -phenytoin -prednisone or other corticosteroids -rifabutin -rifampin -soy isoflavones -sucralfate -theophylline -warfarin This list may not describe all possible interactions.  Give your health care provider a list of all the medicines, herbs, non-prescription drugs, or dietary supplements you use. Also tell them if you smoke, drink alcohol, or use illegal drugs. Some items may interact with your medicine. What should I watch for while using this medicine? Be sure to take this medicine with plenty of fluids. Some tablets may cause choking, gagging, or difficulty swallowing from the tablet getting stuck in your throat. Most of these problems disappear if the medicine is taken with the right amount of water or other fluids. Do not switch brands of this medicine unless your health care professional agrees with the change. Ask questions if you are uncertain. You will need regular exams and occasional blood tests to check the response to treatment. If you are receiving this medicine for an underactive thyroid, it may be several weeks before you notice an improvement. Check with your doctor or health care professional if your symptoms do not improve. It may be necessary for you to take this medicine for the rest of your life. Do not stop using this medicine unless your doctor or health care professional advises you to. This medicine can affect blood sugar levels. If you have diabetes, check your blood sugar as directed.  You may lose some of your hair when you first start treatment. With time, this usually corrects itself. If you are going to have surgery, tell your doctor or health care professional that you are taking this medicine. What side effects may I notice from receiving this medicine? Side effects that you should report to your doctor or health care professional as soon as possible: -allergic reactions like skin rash, itching or hives, swelling of the face, lips, or tongue -chest pain -excessive sweating or intolerance to heat -fast or irregular heartbeat -nervousness -skin rash or hives -swelling of ankles, feet, or legs -tremors Side effects that usually do not  require medical attention (report to your doctor or health care professional if they continue or are bothersome): -changes in appetite -changes in menstrual periods -diarrhea -hair loss -headache -trouble sleeping -weight loss This list may not describe all possible side effects. Call your doctor for medical advice about side effects. You may report side effects to FDA at 1-800-FDA-1088. Where should I keep my medicine? Keep out of the reach of children. Store at room temperature between 15 and 30 degrees C (59 and 86 degrees F). Protect from light and moisture. Keep container tightly closed. Throw away any unused medicine after the expiration date. NOTE: This sheet is a summary. It may not cover all possible information. If you have questions about this medicine, talk to your doctor, pharmacist, or health care provider.  2015, [employer] Standard. (2009-03-23 14:28:07)

## 2015-09-07 ENCOUNTER — Encounter: Payer: Self-pay | Admitting: Internal Medicine

## 2015-09-07 ENCOUNTER — Telehealth: Payer: Self-pay | Admitting: Geriatric Medicine

## 2015-09-07 NOTE — Telephone Encounter (Signed)
Called and spoke with patient's son and advised him to schedule an appt for his mother and if she gets worse to go to the ER, or the emergency room.

## 2015-09-07 NOTE — Telephone Encounter (Signed)
Please call and advise her to come in sooner for sick visit. If still having decreased urine or chest pains or headaches to go to urgent care sooner.

## 2015-09-10 ENCOUNTER — Other Ambulatory Visit: Payer: Self-pay | Admitting: Internal Medicine

## 2015-09-10 DIAGNOSIS — Z1231 Encounter for screening mammogram for malignant neoplasm of breast: Secondary | ICD-10-CM

## 2015-09-14 ENCOUNTER — Encounter: Payer: Medicare Other | Admitting: Internal Medicine

## 2015-09-20 ENCOUNTER — Other Ambulatory Visit: Payer: Self-pay | Admitting: Internal Medicine

## 2015-09-20 ENCOUNTER — Ambulatory Visit (HOSPITAL_COMMUNITY)
Admission: RE | Admit: 2015-09-20 | Discharge: 2015-09-20 | Disposition: A | Discharge status: Home / Self Care | Payer: Medicare Other | Source: Ambulatory Visit | Attending: Internal Medicine | Admitting: Internal Medicine

## 2015-09-20 DIAGNOSIS — Z1231 Encounter for screening mammogram for malignant neoplasm of breast: Secondary | ICD-10-CM | POA: Diagnosis present

## 2015-09-24 ENCOUNTER — Ambulatory Visit (INDEPENDENT_AMBULATORY_CARE_PROVIDER_SITE_OTHER): Payer: Medicare Other | Admitting: Internal Medicine

## 2015-09-24 ENCOUNTER — Other Ambulatory Visit (INDEPENDENT_AMBULATORY_CARE_PROVIDER_SITE_OTHER): Payer: Medicare Other

## 2015-09-24 ENCOUNTER — Encounter: Payer: Self-pay | Admitting: Internal Medicine

## 2015-09-24 VITALS — BP 110/70 | HR 93 | Temp 98.5°F | Resp 12 | Ht 61.0 in | Wt 129.8 lb

## 2015-09-24 DIAGNOSIS — Z23 Encounter for immunization: Secondary | ICD-10-CM | POA: Diagnosis not present

## 2015-09-24 DIAGNOSIS — Z0000 Encounter for general adult medical examination without abnormal findings: Secondary | ICD-10-CM

## 2015-09-24 DIAGNOSIS — R911 Solitary pulmonary nodule: Secondary | ICD-10-CM

## 2015-09-24 DIAGNOSIS — F17200 Nicotine dependence, unspecified, uncomplicated: Secondary | ICD-10-CM

## 2015-09-24 LAB — COMPREHENSIVE METABOLIC PANEL
ALBUMIN: 4 g/dL (ref 3.5–5.2)
ALK PHOS: 74 U/L (ref 39–117)
ALT: 15 U/L (ref 0–35)
AST: 16 U/L (ref 0–37)
BILIRUBIN TOTAL: 0.4 mg/dL (ref 0.2–1.2)
BUN: 28 mg/dL — AB (ref 6–23)
CALCIUM: 9.6 mg/dL (ref 8.4–10.5)
CO2: 29 mEq/L (ref 19–32)
CREATININE: 1.31 mg/dL — AB (ref 0.40–1.20)
Chloride: 104 mEq/L (ref 96–112)
GFR: 42.84 mL/min — ABNORMAL LOW (ref >60.00)
Glucose, Bld: 80 mg/dL (ref 70–99)
Potassium: 3.7 mEq/L (ref 3.5–5.1)
SODIUM: 140 meq/L (ref 135–145)
TOTAL PROTEIN: 7.2 g/dL (ref 6.0–8.3)

## 2015-09-24 LAB — FOLATE: FOLATE: 12.4 ng/mL (ref >5.9)

## 2015-09-24 LAB — VITAMIN B12: VITAMIN B 12: 458 pg/mL (ref 211–911)

## 2015-09-24 NOTE — Progress Notes (Signed)
   Subjective:    Patient ID: Rachel Gardner, female    DOB: 1946-04-10, 69 y.o.   MRN: [id number]  HPI Here for medicare wellness, no new complaints. Please see A/P for status and treatment of chronic medical problems.   Diet: heart healthy Physical activity: sedentary Depression/mood screen: negative Hearing: cochlear implant Visual acuity: grossly normal, performs annual eye exam  ADLs: capable Fall risk: none Home safety: good Cognitive evaluation: intact to orientation, naming, recall and repetition EOL planning: adv directives discussed  I have personally reviewed and have noted 1. The patient's medical and social history - reviewed today no changes 2. Their use of alcohol, tobacco or illicit drugs 3. Their current medications and supplements 4. The patient's functional ability including ADL's, fall risks, home safety risks and hearing or visual impairment. 5. Diet and physical activities 6. Evidence for depression or mood disorders 7. Care team reviewed and updated (available in snapshot)  Review of Systems  Constitutional: Negative for fever, activity change, appetite change, fatigue and unexpected weight change.  HENT: Negative.   Respiratory: Negative for cough, chest tightness, shortness of breath and wheezing.   Cardiovascular: Negative for chest pain, palpitations and leg swelling.  Gastrointestinal: Negative for abdominal pain and abdominal distention.  Musculoskeletal: Negative for back pain, arthralgias and neck pain.  Neurological: Negative for dizziness, tremors, weakness, light-headedness, numbness and headaches.       Slightly shaky  Psychiatric/Behavioral: Negative.       Objective:   Physical Exam  Constitutional: She is oriented to person, place, and time. She appears well-developed and well-nourished.  HENT:  Head: Normocephalic and atraumatic.  Eyes: EOM are normal.  Neck: Normal range of motion.  No appreciable LN in the neck or other masses.     Cardiovascular: Normal rate and regular rhythm.   Pulmonary/Chest: Effort normal and breath sounds normal. No respiratory distress. She has no wheezes. She has no rales.  Abdominal: Soft. Bowel sounds are normal. She exhibits no distension. There is no tenderness. There is no rebound.  Musculoskeletal: She exhibits no edema.  Lymphadenopathy:    She has no cervical adenopathy.  Neurological: She is alert and oriented to person, place, and time. Coordination normal.  Skin: Skin is warm and dry.   Filed Vitals:   09/24/15 1457  BP: 110/70  Pulse: 93  Temp: 98.5 F (36.9 C)  TempSrc: Oral  Resp: 12  Height: 5\' 1"  (1.549 m)  Weight: 129 lb 12.8 oz (58.877 kg)  SpO2: 97%      Assessment & Plan:

## 2015-09-24 NOTE — Progress Notes (Signed)
Pre visit review using our clinic review tool, if applicable. No additional management support is needed unless otherwise documented below in the visit note. 

## 2015-09-24 NOTE — Patient Instructions (Signed)
You can keep taking the whole blood pressure pill. We will check the doppler of the neck as well as the repeat of the chest.   We will check some blood work today and call you back with the results.  Health Maintenance Adopting a healthy lifestyle and getting preventive care can go a long way to promote health and wellness. Talk with your health care provider about what schedule of regular examinations is right for you. This is a good chance for you to check in with your provider about disease prevention and staying healthy. In between checkups, there are plenty of things you can do on your own. Experts have done a lot of research about which lifestyle changes and preventive measures are most likely to keep you healthy. Ask your health care provider for more information. WEIGHT AND DIET  Eat a healthy diet  Be sure to include plenty of vegetables, fruits, low-fat dairy products, and lean protein.  Do not eat a lot of foods high in solid fats, added sugars, or salt.  Get regular exercise. This is one of the most important things you can do for your health.  Most adults should exercise for at least 150 minutes each week. The exercise should increase your heart rate and make you sweat (moderate-intensity exercise).  Most adults should also do strengthening exercises at least twice a week. This is in addition to the moderate-intensity exercise.  Maintain a healthy weight  Body mass index (BMI) is a measurement that can be used to identify possible weight problems. It estimates body fat based on height and weight. Your health care provider can help determine your BMI and help you achieve or maintain a healthy weight.  For females 52 years of age and older:   A BMI below 18.5 is considered underweight.  A BMI of 18.5 to 24.9 is normal.  A BMI of 25 to 29.9 is considered overweight.  A BMI of 30 and above is considered obese.  Watch levels of cholesterol and blood lipids  You should  start having your blood tested for lipids and cholesterol at 68 years of age, then have this test every 5 years.  You may need to have your cholesterol levels checked more often if:  Your lipid or cholesterol levels are high.  You are older than 68 years of age.  You are at high risk for heart disease.  CANCER SCREENING   Lung Cancer  Lung cancer screening is recommended for adults 74-36 years old who are at high risk for lung cancer because of a history of smoking.  A yearly low-dose CT scan of the lungs is recommended for people who:  Currently smoke.  Have quit within the past 15 years.  Have at least a 30-pack-year history of smoking. A pack year is smoking an average of one pack of cigarettes a day for 1 year.  Yearly screening should continue until it has been 15 years since you quit.  Yearly screening should stop if you develop a health problem that would prevent you from having lung cancer treatment.  Breast Cancer  Practice breast self-awareness. This means understanding how your breasts normally appear and feel.  It also means doing regular breast self-exams. Let your health care provider know about any changes, no matter how small.  If you are in your 20s or 30s, you should have a clinical breast exam (CBE) by a health care provider every 1-3 years as part of a regular health exam.  If  you are 40 or older, have a CBE every year. Also consider having a breast X-ray (mammogram) every year.  If you have a family history of breast cancer, talk to your health care provider about genetic screening.  If you are at high risk for breast cancer, talk to your health care provider about having an MRI and a mammogram every year.  Breast cancer gene (BRCA) assessment is recommended for women who have family members with BRCA-related cancers. BRCA-related cancers include:  Breast.  Ovarian.  Tubal.  Peritoneal cancers.  Results of the assessment will determine the  need for genetic counseling and BRCA1 and BRCA2 testing. Cervical Cancer Routine pelvic examinations to screen for cervical cancer are no longer recommended for nonpregnant women who are considered low risk for cancer of the pelvic organs (ovaries, uterus, and vagina) and who do not have symptoms. A pelvic examination may be necessary if you have symptoms including those associated with pelvic infections. Ask your health care provider if a screening pelvic exam is right for you.   The Pap test is the screening test for cervical cancer for women who are considered at risk.  If you had a hysterectomy for a problem that was not cancer or a condition that could lead to cancer, then you no longer need Pap tests.  If you are older than 65 years, and you have had normal Pap tests for the past 10 years, you no longer need to have Pap tests.  If you have had past treatment for cervical cancer or a condition that could lead to cancer, you need Pap tests and screening for cancer for at least 20 years after your treatment.  If you no longer get a Pap test, assess your risk factors if they change (such as having a new sexual partner). This can affect whether you should start being screened again.  Some women have medical problems that increase their chance of getting cervical cancer. If this is the case for you, your health care provider may recommend more frequent screening and Pap tests.  The human papillomavirus (HPV) test is another test that may be used for cervical cancer screening. The HPV test looks for the virus that can cause cell changes in the cervix. The cells collected during the Pap test can be tested for HPV.  The HPV test can be used to screen women 33 years of age and older. Getting tested for HPV can extend the interval between normal Pap tests from three to five years.  An HPV test also should be used to screen women of any age who have unclear Pap test results.  After 68 years of age,  women should have HPV testing as often as Pap tests.  Colorectal Cancer  This type of cancer can be detected and often prevented.  Routine colorectal cancer screening usually begins at 68 years of age and continues through 68 years of age.  Your health care provider may recommend screening at an earlier age if you have risk factors for colon cancer.  Your health care provider may also recommend using home test kits to check for hidden blood in the stool.  A small camera at the end of a tube can be used to examine your colon directly (sigmoidoscopy or colonoscopy). This is done to check for the earliest forms of colorectal cancer.  Routine screening usually begins at age 24.  Direct examination of the colon should be repeated every 5-10 years through 68 years of age. However, you  may need to be screened more often if early forms of precancerous polyps or small growths are found. Skin Cancer  Check your skin from head to toe regularly.  Tell your health care provider about any new moles or changes in moles, especially if there is a change in a mole's shape or color.  Also tell your health care provider if you have a mole that is larger than the size of a pencil eraser.  Always use sunscreen. Apply sunscreen liberally and repeatedly throughout the day.  Protect yourself by wearing long sleeves, pants, a wide-brimmed hat, and sunglasses whenever you are outside. HEART DISEASE, DIABETES, AND HIGH BLOOD PRESSURE   Have your blood pressure checked at least every 1-2 years. High blood pressure causes heart disease and increases the risk of stroke.  If you are between 77 years and 60 years old, ask your health care provider if you should take aspirin to prevent strokes.  Have regular diabetes screenings. This involves taking a blood sample to check your fasting blood sugar level.  If you are at a normal weight and have a low risk for diabetes, have this test once every three years after 68  years of age.  If you are overweight and have a high risk for diabetes, consider being tested at a younger age or more often. PREVENTING INFECTION  Hepatitis B  If you have a higher risk for hepatitis B, you should be screened for this virus. You are considered at high risk for hepatitis B if:  You were born in a country where hepatitis B is common. Ask your health care provider which countries are considered high risk.  Your parents were born in a high-risk country, and you have not been immunized against hepatitis B (hepatitis B vaccine).  You have HIV or AIDS.  You use needles to inject street drugs.  You live with someone who has hepatitis B.  You have had sex with someone who has hepatitis B.  You get hemodialysis treatment.  You take certain medicines for conditions, including cancer, organ transplantation, and autoimmune conditions. Hepatitis C  Blood testing is recommended for:  Everyone born from [birth year] through 1964.  Anyone with known risk factors for hepatitis C. Sexually transmitted infections (STIs)  You should be screened for sexually transmitted infections (STIs) including gonorrhea and chlamydia if:  You are sexually active and are younger than 69 years of age.  You are older than 68 years of age and your health care provider tells you that you are at risk for this type of infection.  Your sexual activity has changed since you were last screened and you are at an increased risk for chlamydia or gonorrhea. Ask your health care provider if you are at risk.  If you do not have HIV, but are at risk, it may be recommended that you take a prescription medicine daily to prevent HIV infection. This is called pre-exposure prophylaxis (PrEP). You are considered at risk if:  You are sexually active and do not regularly use condoms or know the HIV status of your partner(s).  You take drugs by injection.  You are sexually active with a partner who has HIV. Talk with  your health care provider about whether you are at high risk of being infected with HIV. If you choose to begin PrEP, you should first be tested for HIV. You should then be tested every 3 months for as long as you are taking PrEP.  PREGNANCY   If you  are premenopausal and you may become pregnant, ask your health care provider about preconception counseling.  If you may become pregnant, take 400 to 800 micrograms (mcg) of folic acid every day.  If you want to prevent pregnancy, talk to your health care provider about birth control (contraception). OSTEOPOROSIS AND MENOPAUSE   Osteoporosis is a disease in which the bones lose minerals and strength with aging. This can result in serious bone fractures. Your risk for osteoporosis can be identified using a bone density scan.  If you are 66 years of age or older, or if you are at risk for osteoporosis and fractures, ask your health care provider if you should be screened.  Ask your health care provider whether you should take a calcium or vitamin D supplement to lower your risk for osteoporosis.  Menopause may have certain physical symptoms and risks.  Hormone replacement therapy may reduce some of these symptoms and risks. Talk to your health care provider about whether hormone replacement therapy is right for you.  HOME CARE INSTRUCTIONS   Schedule regular health, dental, and eye exams.  Stay current with your immunizations.   Do not use any tobacco products including cigarettes, chewing tobacco, or electronic cigarettes.  If you are pregnant, do not drink alcohol.  If you are breastfeeding, limit how much and how often you drink alcohol.  Limit alcohol intake to no more than 1 drink per day for nonpregnant women. One drink equals 12 ounces of beer, 5 ounces of wine, or 1 ounces of hard liquor.  Do not use street drugs.  Do not share needles.  Ask your health care provider for help if you need support or information about quitting  drugs.  Tell your health care provider if you often feel depressed.  Tell your health care provider if you have ever been abused or do not feel safe at home. Document Released: 06/30/2011 Document Revised: 05/01/2014 Document Reviewed: 11/16/2013 [facility] Patient Information 2015 [place], [place]. This information is not intended to replace advice given to you by your health care provider. Make sure you discuss any questions you have with your health care provider.

## 2015-09-25 DIAGNOSIS — Z0000 Encounter for general adult medical examination without abnormal findings: Secondary | ICD-10-CM | POA: Insufficient documentation

## 2015-09-25 NOTE — Assessment & Plan Note (Signed)
Checking some labs today. Given pneumonia and flu shot to complete pneumonia series. Up to date on mammogram and colonoscopy. Talked to her and counseled her on the need to stop smoking but she does not want to at this time. 10 year screening recommendations given at visit.

## 2015-09-25 NOTE — Assessment & Plan Note (Signed)
Due for follow up and have ordered CT with contrast and BMP to evaluate kidney function.

## 2015-09-27 ENCOUNTER — Encounter: Payer: Self-pay | Admitting: Internal Medicine

## 2015-09-28 ENCOUNTER — Encounter: Payer: Self-pay | Admitting: Internal Medicine

## 2015-10-02 ENCOUNTER — Other Ambulatory Visit: Payer: Self-pay | Admitting: Internal Medicine

## 2015-10-02 DIAGNOSIS — R209 Unspecified disturbances of skin sensation: Secondary | ICD-10-CM

## 2015-10-02 DIAGNOSIS — F17200 Nicotine dependence, unspecified, uncomplicated: Secondary | ICD-10-CM

## 2015-10-03 ENCOUNTER — Ambulatory Visit (HOSPITAL_COMMUNITY)
Admission: RE | Admit: 2015-10-03 | Discharge: 2015-10-03 | Disposition: A | Discharge status: Home / Self Care | Payer: Medicare Other | Source: Ambulatory Visit | Attending: Cardiovascular Disease | Admitting: Cardiovascular Disease

## 2015-10-03 DIAGNOSIS — E785 Hyperlipidemia, unspecified: Secondary | ICD-10-CM | POA: Diagnosis not present

## 2015-10-03 DIAGNOSIS — I6523 Occlusion and stenosis of bilateral carotid arteries: Secondary | ICD-10-CM | POA: Insufficient documentation

## 2015-10-03 DIAGNOSIS — F17200 Nicotine dependence, unspecified, uncomplicated: Secondary | ICD-10-CM | POA: Diagnosis not present

## 2015-10-03 DIAGNOSIS — I10 Essential (primary) hypertension: Secondary | ICD-10-CM | POA: Diagnosis not present

## 2015-10-03 DIAGNOSIS — R209 Unspecified disturbances of skin sensation: Secondary | ICD-10-CM | POA: Insufficient documentation

## 2015-10-03 DIAGNOSIS — Z720 Tobacco use: Secondary | ICD-10-CM

## 2015-10-05 ENCOUNTER — Ambulatory Visit (INDEPENDENT_AMBULATORY_CARE_PROVIDER_SITE_OTHER)
Admission: RE | Admit: 2015-10-05 | Discharge: 2015-10-05 | Disposition: A | Discharge status: Home / Self Care | Payer: Medicare Other | Source: Ambulatory Visit | Attending: Internal Medicine | Admitting: Internal Medicine

## 2015-10-05 ENCOUNTER — Encounter: Payer: Self-pay | Admitting: Internal Medicine

## 2015-10-05 DIAGNOSIS — R911 Solitary pulmonary nodule: Secondary | ICD-10-CM | POA: Diagnosis not present

## 2015-10-12 ENCOUNTER — Encounter: Payer: Self-pay | Admitting: Internal Medicine

## 2015-11-14 ENCOUNTER — Ambulatory Visit (INDEPENDENT_AMBULATORY_CARE_PROVIDER_SITE_OTHER): Payer: Medicare Other | Admitting: Endocrinology

## 2015-11-14 ENCOUNTER — Encounter: Payer: Self-pay | Admitting: Endocrinology

## 2015-11-14 VITALS — BP 124/76 | HR 91 | Temp 98.4°F | Ht 61.0 in

## 2015-11-14 DIAGNOSIS — E039 Hypothyroidism, unspecified: Secondary | ICD-10-CM | POA: Diagnosis not present

## 2015-11-14 DIAGNOSIS — E559 Vitamin D deficiency, unspecified: Secondary | ICD-10-CM

## 2015-11-14 NOTE — Progress Notes (Signed)
Subjective:    Patient ID: Rachel Gardner, female    DOB: 27-Mar-1946, 69 y.o.   MRN: [id number]  HPI Pt returns for f/u of postsurgical hypothyroidism (she had thyroidectomy in 2005, for a goiter--was benign on pathology.  she has been on prescribed thyroid hormone therapy since then.   In 2015, she was taking 125 mcg/d, and TSH was 18 (she had generalized myalgias, and assoc cold intolerance).  She increased to 1 1/2 of 125 mcg/day. In jan of 2016, TSH was 0.09.  Synthroid was decreased back to 125/d.  In May of 2016, TSH in [place] was 0.32 (again on 125/d);  in July of 2016 was 0.59).  She has moderate cramps in the legs, and assoc difficulty with concentration.  Past Medical History  Diagnosis Date  . Hyperlipidemia   . Blood in stool   . Diverticulitis   . GERD (gastroesophageal reflux disease)   . Allergy     hay fever  . Hypertension   . Kidney stones   . 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 Helicobacter pylori infection   . Hypothyroidism   . Obstructive sleep apnea   . Genital herpes   . Fatty liver 05/28/10    [facility]  . Hiatal hernia 05/28/10    CT @ [facility]  . Meniere's disease   . BPPV (benign paroxysmal positional vertigo)   . Ascending aortic aneurysm ([place])   . Lung nodule seen on imaging study   . Glaucoma     Past Surgical History  Procedure Laterality Date  . Cholecystectomy  1989  . Appendectomy  1989  . Abdominal hysterectomy  1995  . Total thyroidectomy  1995  . Breast surgery  1989    breast reduction  . 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 Laparoscopic sigmoid colectomy  2012    with takedown of splenic flexure; cystoscopy with bilateral ureteral stent placement  . Bppv      x 4 after L cochlear implant    Social History   Social History  . Marital Status: Divorced    Spouse Name: N/A  . Number of Children: 3  . Years of Education: N/A   Occupational History  . retired Marine scientist    Social  History Main Topics  . Smoking status: Current Every Day Smoker -- 0.50 packs/day for 41 years    Types: Cigarettes  . Smokeless tobacco: Never Used     Comment: desires to quit; but has not been successful  . Alcohol Use: 0.0 oz/week    0 Standard drinks or equivalent per week     Comment: rarely  . Drug Use: No  . Sexual Activity: No   Other Topics Concern  . Not on file   Social History Narrative   Regular exercise: walk; 1 mile a day / walk 5 miles weekend;    Caffeine use: 1 cup of coffee in the AM      3 children          Current Outpatient Prescriptions on File Prior to Visit  Medication Sig Dispense Refill  . ascorbic acid (VITAMIN C) 1000 MG tablet Take 500 mg by mouth daily.     Marland Kitchen b complex vitamins tablet Take 1 tablet by mouth daily.    . bimatoprost (LUMIGAN) 0.01 % SOLN Place 1 drop into both eyes at bedtime.    . Biotin 10 MG CAPS Take 1 capsule  by mouth daily.    Marland Kitchen lactobacillus acidophilus (BACID) TABS tablet Take 1 tablet by mouth daily. 20 billions units per day    . levothyroxine (SYNTHROID, LEVOTHROID) 125 MCG tablet Take 125 mcg by mouth daily before breakfast. Takes 1 and 1/2 tablet per day    . losartan-hydrochlorothiazide (HYZAAR) 100-25 MG per tablet Take 1 tablet by mouth daily.     . Omega-3 Fatty Acids (FISH OIL PO) Take 1 tablet by mouth daily.     No current facility-administered medications on file prior to visit.    Allergies  Allergen Reactions  . Morphine And Related     Nausea Can tolerate Dilaudid  . Statins Other (See Comments)    Joint pain   . Tape Other (See Comments)    Causes redness and will take skin when removed  . Wellbutrin [Bupropion] Other (See Comments)    Suicidal intensions    Family History  Problem Relation Age of Onset  . Hypertension Mother   . Hypertension Father   . Hypertension Sister   . Prostate cancer Brother   . Hyperlipidemia Brother   . Colon cancer Maternal Aunt   . Prostate cancer Paternal  Uncle   . Colon cancer Cousin     x 2; paternal  . Thyroid disease Sister     BP 124/76 mmHg  Pulse 91  Temp(Src) 98.4 F (36.9 C) (Oral)  Ht 5\' 1"  (1.549 m)  SpO2 94%  Review of Systems She has fatigue and hair loss    Objective:   Physical Exam VITAL SIGNS:  See vs page GENERAL: no distress Neck: a healed scar is present.  i do not appreciate a nodule in the thyroid or elsewhere in the neck Legs: no edema.  nontender.      Assessment & Plan:  Hypothyroidism, on rx.  She takes biotin, which can interfere with T4 measurement.    Patient is advised the following: Patient Instructions  blood tests are requested for you today.  We'll let you know about the results.  Please note that the biotin can interfere with the test results, though.  Therefore, please wait a few days before doing the blood tests.  Over time, the thyroid sometimes grows back.  If this happens, you would notice that you need less medication. Please come back for a follow-up appointment in 6 months.

## 2015-11-14 NOTE — Patient Instructions (Addendum)
blood tests are requested for you today.  We'll let you know about the results.  Please note that the biotin can interfere with the test results, though.  Therefore, please wait a few days before doing the blood tests.  Over time, the thyroid sometimes grows back.  If this happens, you would notice that you need less medication. Please come back for a follow-up appointment in 6 months.

## 2015-11-23 ENCOUNTER — Other Ambulatory Visit (INDEPENDENT_AMBULATORY_CARE_PROVIDER_SITE_OTHER): Payer: Medicare Other

## 2015-11-23 DIAGNOSIS — E559 Vitamin D deficiency, unspecified: Secondary | ICD-10-CM

## 2015-11-23 DIAGNOSIS — E039 Hypothyroidism, unspecified: Secondary | ICD-10-CM

## 2015-11-23 LAB — T3, FREE: T3, Free: 2.5 pg/mL (ref 2.3–4.2)

## 2015-11-23 LAB — VITAMIN D 25 HYDROXY (VIT D DEFICIENCY, FRACTURES): VITD: 23.25 ng/mL — ABNORMAL LOW (ref 30.00–100.00)

## 2015-11-23 LAB — T4, FREE: Free T4: 0.75 ng/dL (ref 0.60–1.60)

## 2015-11-23 LAB — TSH: TSH: 25.49 u[IU]/mL — AB (ref 0.35–4.50)

## 2015-11-24 ENCOUNTER — Other Ambulatory Visit: Payer: Self-pay | Admitting: Endocrinology

## 2015-11-24 ENCOUNTER — Encounter: Payer: Self-pay | Admitting: Endocrinology

## 2015-11-24 DIAGNOSIS — E039 Hypothyroidism, unspecified: Secondary | ICD-10-CM

## 2015-11-24 MED ORDER — LEVOTHYROXINE SODIUM 200 MCG PO TABS: 200.0000 ug | ORAL_TABLET | Freq: Every day | ORAL | Status: DC

## 2015-11-26 ENCOUNTER — Other Ambulatory Visit: Payer: Self-pay | Admitting: Endocrinology

## 2015-11-26 MED ORDER — LEVOTHYROXINE SODIUM 150 MCG PO TABS: 150.0000 ug | ORAL_TABLET | Freq: Every day | ORAL | Status: DC

## 2016-01-09 ENCOUNTER — Ambulatory Visit (INDEPENDENT_AMBULATORY_CARE_PROVIDER_SITE_OTHER): Payer: Medicare Other | Admitting: Surgery

## 2016-01-09 ENCOUNTER — Encounter: Payer: Self-pay | Admitting: Surgery

## 2016-01-09 VITALS — BP 112/64 | HR 96 | Resp 20 | Ht 61.0 in | Wt 130.0 lb

## 2016-01-09 DIAGNOSIS — I712 Thoracic aortic aneurysm, without rupture: Secondary | ICD-10-CM

## 2016-01-09 DIAGNOSIS — I7121 Aneurysm of the ascending aorta, without rupture: Secondary | ICD-10-CM

## 2016-01-09 DIAGNOSIS — R911 Solitary pulmonary nodule: Secondary | ICD-10-CM

## 2016-01-09 NOTE — Progress Notes (Signed)
HPI:  She returns today for follow up of an ascending aortic aneurysm and a 7 mm nodule in the left lower lobe. I saw her initially on 01/06/2015 and a CT scan of the chest showed a 4 cm fusiform ascending aortic aneurysm and the 7 mm LLL lung nodule as well as diffuse three vessel coronary calcification. Her aorta was 3.8 cm by CT in 2006, 3.4 cm by MRI in 2008, and 3.4 cm by CT in 2012, all done when she lived in [place]. The left lower lobe nodule has also been present since 2006 when it measured 75mm. It is minimally enlarged after 9 years and is smoothly marginated and almost certainly benign. When I saw her in January last year I planned to see her back in one year with a CT of the chest. She had a repeat CT done on 10/05/2015 by her PCP and this showed that the ascending aorta had increased in diameter to 4.4 cm. The LLL lung nodule was unchanged. She says that she has been feeling fairly well but reports that her energy level is decreased and she has some exertional shortness of breath. She reports having an episode of severe tightness and aching in both side of her neck in September that resolved and she had not had that again. She denies any chest discomfort at this time.  Current Outpatient Prescriptions  Medication Sig Dispense Refill  . ascorbic acid (VITAMIN C) 1000 MG tablet Take 500 mg by mouth daily.     Marland Kitchen b complex vitamins tablet Take 1 tablet by mouth daily.    . bimatoprost (LUMIGAN) 0.01 % SOLN Place 1 drop into both eyes at bedtime.    . Biotin 10 MG CAPS Take 1 capsule by mouth daily.    . Calcium Carb-Cholecalciferol (CALCIUM 500 + D3) 500-600 MG-UNIT TABS Take by mouth.    . lactobacillus acidophilus (BACID) TABS tablet Take 1 tablet by mouth daily. 20 billions units per day    . levothyroxine (SYNTHROID, LEVOTHROID) 150 MCG tablet Take 1 tablet (150 mcg total) by mouth daily. 90 tablet 1  . losartan-hydrochlorothiazide (HYZAAR) 100-25 MG per tablet Take 1 tablet by  mouth daily.     . Omega-3 Fatty Acids (FISH OIL PO) Take 1 tablet by mouth daily.    . meloxicam (MOBIC) 15 MG tablet TK 1 T PO D  0   No current facility-administered medications for this visit.     Physical Exam: BP 112/64 mmHg  Pulse 96  Resp 20  Ht 5\' 1"  (1.549 m)  Wt 130 lb (58.968 kg)  BMI 24.58 kg/m2  SpO2 97% She looks well Cardiac exam shows a regular rate and rhythm with normal heart sounds. There is no murmur Her lungs are clear There is no peripheral edema  Diagnostic Tests:  CLINICAL DATA: Previously noted left lower lobe nodule  EXAM: CT CHEST WITHOUT CONTRAST  TECHNIQUE: Multidetector CT imaging of the chest was performed following the standard protocol without IV contrast.  COMPARISON: Chest CT January 03, 2015  FINDINGS: The previously noted 7 mm nodular lesion in the periphery of the superior segment left lower lobe is currently seen on axial slice 30 series 3 and is stable. No new pulmonary nodular opacities are identified. There is stable mild scarring in the extreme apices in both lung base regions. There is no appreciable edema or consolidation.  Thyroid appears normal. No appreciable adenopathy. There is atherosclerotic change in aorta. The maximum transverse diameter  of the ascending thoracic aorta is 4.4 x 4.1 cm. There are multiple foci of coronary artery calcification. Pericardium is not thickened. There are scattered atherosclerotic foci in visualized great vessels.  In the visualized upper abdomen, there is atherosclerotic change in the abdominal aorta. Visualized upper abdominal structures otherwise appear unremarkable.  There are no blastic or lytic bone lesions. There is degenerative change in the thoracic spine. There is mild dextroscoliosis.  IMPRESSION: Stable 7 mm nodular opacity in the left lower lobe superior segment. No new pulmonary nodular opacities. No edema or consolidation.  Prominence of the ascending  thoracic aorta with maximum transverse diameter of 4.4 x 4.1 cm. Recommend annual imaging followup by CTA or MRA. This recommendation follows 2010 ACCF/AHA/AATS/ACR/ASA/SCA/SCAI/SIR/STS/SVM Guidelines for the Diagnosis and Management of Patients with Thoracic Aortic Disease. Circulation. 2010; 121: [id number]  No adenopathy. There are stable small mediastinal lymph nodes which do not meet size criteria for pathologic significance.  There is extensive coronary artery calcification.   Electronically Signed  By: Lowella Grip III M.D.  On: 10/05/2015 10:29   Impression:  She has had slight enlargement of the ascending aortic aneurysm from 4.0 to 4.4 cm over a nine month period. The LLL lung nodule is stable at 7 mm. There is extensive coronary calcification. I think she should have a repeat Chest CT in one year to evaluate the aneurysm and lung nodule. The current recommendation for replacing the ascending aorta is a diameter of 5.5 cm or a period of rapid enlargement defined as 5 mm enlargement over one year. I am concerned about her coronary calcification in conjunction with her symptoms of exertional tiredness and shortness of breath as well as that episode of neck tightness. I think it would be worth having her evaluated by cardiology.   Plan:  She will return in one year for CT of the chest without contrast.  We will arrange cardiology evaluation to assess for coronary ischemia.    Gaye Pollack, MD [facility] [phone]

## 2016-01-11 ENCOUNTER — Encounter: Payer: Self-pay | Admitting: Internal Medicine

## 2016-01-12 ENCOUNTER — Ambulatory Visit (INDEPENDENT_AMBULATORY_CARE_PROVIDER_SITE_OTHER): Payer: Medicare Other | Admitting: Internal Medicine

## 2016-01-12 ENCOUNTER — Encounter: Payer: Self-pay | Admitting: Internal Medicine

## 2016-01-12 VITALS — BP 130/78 | HR 91 | Temp 98.1°F | Resp 16 | Wt 129.0 lb

## 2016-01-12 DIAGNOSIS — K645 Perianal venous thrombosis: Secondary | ICD-10-CM

## 2016-01-12 MED ORDER — LIDOCAINE-HYDROCORTISONE ACE 3-0.5 % RE CREA: 1.0000 | TOPICAL_CREAM | Freq: Two times a day (BID) | RECTAL | Status: DC | PRN

## 2016-01-12 NOTE — Progress Notes (Signed)
Pre visit review using our clinic review tool, if applicable. No additional management support is needed unless otherwise documented below in the visit note. 

## 2016-01-12 NOTE — Progress Notes (Signed)
Subjective:    Patient ID: Rachel Gardner, female    DOB: 09-26-1946, 70 y.o.   MRN: [id number]  HPI  Here to f/u with c/o 3 days onset acute mild to mod painful lump at the rectum, with some radiation of the pain to the right buttock it seems, but no drainage, fever, bleeding. Denies worsening reflux, abd pain, dysphagia, n/v, bowel change or blood.  Pt denies chest pain, increased sob or doe, wheezing, orthopnea, PND, increased LE swelling, palpitations, dizziness or syncope.  Has plane flight to [place] in a few days Past Medical History  Diagnosis Date  . Hyperlipidemia   . Blood in stool   . Diverticulitis   . GERD (gastroesophageal reflux disease)   . Allergy     hay fever  . Hypertension   . Kidney stones   . 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 Helicobacter pylori infection   . Hypothyroidism   . Obstructive sleep apnea   . Genital herpes   . Fatty liver 05/28/10    [facility]  . Hiatal hernia 05/28/10    CT @ [facility]  . Meniere's disease   . BPPV (benign paroxysmal positional vertigo)   . Ascending aortic aneurysm ([place])   . Lung nodule seen on imaging study   . Glaucoma    Past Surgical History  Procedure Laterality Date  . Cholecystectomy  1989  . Appendectomy  1989  . Abdominal hysterectomy  1995  . Total thyroidectomy  1995  . Breast surgery  1989    breast reduction  . 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 Laparoscopic sigmoid colectomy  2012    with takedown of splenic flexure; cystoscopy with bilateral ureteral stent placement  . Bppv      x 4 after L cochlear implant    reports that she has been smoking Cigarettes.  She has a 20.5 pack-year smoking history. She has never used smokeless tobacco. She reports that she drinks alcohol. She reports that she does not use illicit drugs. family history includes Colon cancer in her cousin and maternal aunt; Hyperlipidemia in her brother; Hypertension in her father, mother,  and sister; Prostate cancer in her brother and paternal uncle; Thyroid disease in her sister. Allergies  Allergen Reactions  . Morphine And Related     Nausea Can tolerate Dilaudid  . Statins Other (See Comments)    Joint pain   . Tape Other (See Comments)    Causes redness and will take skin when removed  . Wellbutrin [Bupropion] Other (See Comments)    Suicidal intensions   Current Outpatient Prescriptions on File Prior to Visit  Medication Sig Dispense Refill  . ascorbic acid (VITAMIN C) 1000 MG tablet Take 500 mg by mouth daily.     Marland Kitchen b complex vitamins tablet Take 1 tablet by mouth daily.    . bimatoprost (LUMIGAN) 0.01 % SOLN Place 1 drop into both eyes at bedtime.    . Biotin 10 MG CAPS Take 1 capsule by mouth daily.    . Calcium Carb-Cholecalciferol (CALCIUM 500 + D3) 500-600 MG-UNIT TABS Take by mouth.    . lactobacillus acidophilus (BACID) TABS tablet Take 1 tablet by mouth daily. 20 billions units per day    . levothyroxine (SYNTHROID, LEVOTHROID) 150 MCG tablet Take 1 tablet (150 mcg total) by mouth daily. 90 tablet 1  . losartan-hydrochlorothiazide (HYZAAR) 100-25 MG per tablet Take 1 tablet by  mouth daily.     . meloxicam (MOBIC) 15 MG tablet TK 1 T PO D  0  . Omega-3 Fatty Acids (FISH OIL PO) Take 1 tablet by mouth daily.     No current facility-administered medications on file prior to visit.   Review of Systems All otherwise neg per pt     Objective:   Physical Exam BP 130/78 mmHg  Pulse 91  Temp(Src) 98.1 F (36.7 C) (Oral)  Resp 16  Wt 129 lb (58.514 kg)  SpO2 97% VS noted, not ill appearing Constitutional: Pt appears in no significant distress HENT: Head: NCAT.  Right Ear: External ear normal.  Left Ear: External ear normal.  Eyes: . Pupils are equal, round, and reactive to light. Conjunctivae and EOM are normal Neck: Normal range of motion. Neck supple.  Cardiovascular: Normal rate and regular rhythm.   Pulmonary/Chest: Effort normal and breath  sounds without rales or wheezing.  Abd:  Soft, NT, ND, + BS DRE: ext exam only with 4 oclock just over dime sized thrombosed ext hemorrhoid, mild tender, no drainage or bleeding Neurological: Pt is alert. Not confused , motor grossly intact Skin: Skin is warm. No rash, no LE edema Psychiatric: Pt behavior is normal. No agitation.     Assessment & Plan:

## 2016-01-12 NOTE — Assessment & Plan Note (Signed)
Mild to mod fortunately, for anamantle HC prn, o/w supportive care prn

## 2016-01-12 NOTE — Patient Instructions (Signed)
Please take all new medication as prescribed - the cream  Please continue all other medications as before, and refills have been done if requested.  Please have the pharmacy call with any other refills you may need.  Please keep your appointments with your specialists as you may have planned   

## 2016-02-01 ENCOUNTER — Encounter: Payer: Self-pay | Admitting: Endocrinology

## 2016-02-12 DIAGNOSIS — H401122 Primary open-angle glaucoma, left eye, moderate stage: Secondary | ICD-10-CM | POA: Diagnosis not present

## 2016-02-12 DIAGNOSIS — H401112 Primary open-angle glaucoma, right eye, moderate stage: Secondary | ICD-10-CM | POA: Diagnosis not present

## 2016-03-19 ENCOUNTER — Encounter: Payer: Self-pay | Admitting: Cardiology

## 2016-03-19 ENCOUNTER — Ambulatory Visit (INDEPENDENT_AMBULATORY_CARE_PROVIDER_SITE_OTHER): Payer: Medicare Other | Admitting: Cardiology

## 2016-03-19 VITALS — BP 126/74 | HR 74 | Ht 61.0 in | Wt 134.0 lb

## 2016-03-19 DIAGNOSIS — I2510 Atherosclerotic heart disease of native coronary artery without angina pectoris: Secondary | ICD-10-CM | POA: Insufficient documentation

## 2016-03-19 DIAGNOSIS — Z87891 Personal history of nicotine dependence: Secondary | ICD-10-CM

## 2016-03-19 DIAGNOSIS — I10 Essential (primary) hypertension: Secondary | ICD-10-CM

## 2016-03-19 DIAGNOSIS — I208 Other forms of angina pectoris: Secondary | ICD-10-CM | POA: Insufficient documentation

## 2016-03-19 DIAGNOSIS — E785 Hyperlipidemia, unspecified: Secondary | ICD-10-CM

## 2016-03-19 DIAGNOSIS — I77810 Thoracic aortic ectasia: Secondary | ICD-10-CM

## 2016-03-19 DIAGNOSIS — J410 Simple chronic bronchitis: Secondary | ICD-10-CM | POA: Insufficient documentation

## 2016-03-19 DIAGNOSIS — I2584 Coronary atherosclerosis due to calcified coronary lesion: Secondary | ICD-10-CM

## 2016-03-19 DIAGNOSIS — I700 Atherosclerosis of aorta: Secondary | ICD-10-CM

## 2016-03-19 MED ORDER — EZETIMIBE 10 MG PO TABS: 10.0000 mg | ORAL_TABLET | Freq: Every day | ORAL | Status: DC

## 2016-03-19 NOTE — Progress Notes (Signed)
Cardiology Office Note    Date:  03/19/2016   ID:  Rachel, Gardner [birth year], MRN [id number]  PCP:  Hoyt Koch, MD  Cardiologist:   Candee Furbish, MD     History of Present Illness:  Rachel Gardner is a 70 y.o. female here for evaluation of possible exertional angina as well as coronary calcification at the request of Dr. Cyndia Bent.  Has a history of ascending aortic aneurysm from 4.0-4.4 cm over a nine-month period and a left lower lobe lung nodule that is stable at 7 mm. Extensive coronary calcification was seen on CT scan. Symptoms have been exertional tiredness as well as shortness of breath as well as episode of neck tightness. Had prior neck injury. BP was 160 at the time. Even her necklace was bothering her. This happened more significantly in September.  2013 had BPPV in [place].  On 04/19/12 - ECHO normal EF.  ETT 7:30 min non specific STT changes  Statin intol - Lipitor, crestor, pravastatin, zocor,  - muscle pain. LDL 115. Will ask son to get Zetia. Was on prior and better.    Quit smoking 12/2015. Retired Marine scientist.     Past Medical History  Diagnosis Date  . Hyperlipidemia   . Blood in stool   . Diverticulitis   . GERD (gastroesophageal reflux disease)   . Allergy     hay fever  . Hypertension   . Kidney stones   . 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 Helicobacter pylori infection   . Hypothyroidism   . Obstructive sleep apnea   . Genital herpes   . Fatty liver 05/28/10    per CT at [facility]  . Hiatal hernia 05/28/10    CT @ [facility]  . Meniere's disease   . BPPV (benign paroxysmal positional vertigo)   . Ascending aortic aneurysm ([place])   . Lung nodule seen on imaging study   . Glaucoma     Past Surgical History  Procedure Laterality Date  . Cholecystectomy  1989  . Appendectomy  1989  . Abdominal hysterectomy  1995  . Total thyroidectomy  1995  . Breast surgery  1989    breast reduction  . Cochlear implant     Left/2009; Right/2012  . Laparoscopic sigmoid colectomy  2012    with takedown of splenic flexure; cystoscopy with bilateral ureteral stent placement  . Bppv      x 4 after L cochlear implant    Outpatient Prescriptions Prior to Visit  Medication Sig Dispense Refill  . ascorbic acid (VITAMIN C) 1000 MG tablet Take 500 mg by mouth daily.     Marland Kitchen b complex vitamins tablet Take 1 tablet by mouth daily.    . bimatoprost (LUMIGAN) 0.01 % SOLN Place 1 drop into both eyes at bedtime.    . Calcium Carb-Cholecalciferol (CALCIUM 500 + D3) 500-600 MG-UNIT TABS Take by mouth.    . lactobacillus acidophilus (BACID) TABS tablet Take 1 tablet by mouth daily. 20 billions units per day    . levothyroxine (SYNTHROID, LEVOTHROID) 150 MCG tablet Take 1 tablet (150 mcg total) by mouth daily. 90 tablet 1  . lidocaine-hydrocortisone (ANAMANTEL HC) 3-0.5 % CREA Place 1 Applicatorful rectally 2 (two) times daily as needed. 35 g 0  . losartan-hydrochlorothiazide (HYZAAR) 100-25 MG per tablet Take 1 tablet by mouth daily.     . Omega-3 Fatty Acids (FISH OIL PO) Take 1 tablet by mouth daily.    Marland Kitchen  Biotin 10 MG CAPS Take 1 capsule by mouth daily. Reported on 03/19/2016    . meloxicam (MOBIC) 15 MG tablet Reported on 03/19/2016  0   No facility-administered medications prior to visit.     Allergies:   Morphine and related; Statins; Tape; and Wellbutrin   Social History   Social History  . Marital Status: Divorced    Spouse Name: N/A  . Number of Children: 3  . Years of Education: N/A   Occupational History  . retired Marine scientist    Social History Main Topics  . Smoking status: Former Smoker -- 0.50 packs/day for 41 years    Types: Cigarettes    Quit date: 02/18/2016  . Smokeless tobacco: Never Used  . Alcohol Use: 0.0 oz/week    0 Standard drinks or equivalent per week     Comment: rarely  . Drug Use: No  . Sexual Activity: No   Other Topics Concern  . None   Social History Narrative   Regular exercise:  walk; 1 mile a day / walk 5 miles weekend;    Caffeine use: 1 cup of coffee in the AM      3 children           Family History:  The patient's family history includes Colon cancer in her cousin and maternal aunt; Hyperlipidemia in her brother; Hypertension in her father, mother, and sister; Prostate cancer in her brother and paternal uncle; Thyroid disease in her sister.   ROS:   Please see the history of present illness.    ROS All other systems reviewed and are negative.   PHYSICAL EXAM:   VS:  BP 126/74 mmHg  Pulse 74  Ht 5\' 1"  (1.549 m)  Wt 134 lb (60.782 kg)  BMI 25.33 kg/m2   GEN: Well nourished, well developed, in no acute distress HEENT: normal Neck: no JVD, carotid bruits, or masses Cardiac: RRR; no murmurs, rubs, or gallops,no edema  Respiratory:  clear to auscultation bilaterally, normal work of breathing GI: soft, nontender, nondistended, + BS MS: no deformity or atrophy Skin: warm and dry, no rash Neuro:  Alert and Oriented x 3, Strength and sensation are intact Psych: euthymic mood, full affect  Wt Readings from Last 3 Encounters:  03/19/16 134 lb (60.782 kg)  01/12/16 129 lb (58.514 kg)  01/09/16 130 lb (58.968 kg)      Studies/Labs Reviewed:   EKG:  EKG is ordered today.  The ekg ordered today demonstrates 03/19/16-sinus rhythm, 74, mild sinus arrhythmia otherwise unremarkable, personally viewed.  Recent Labs: 07/10/2015: Magnesium 2.0 09/24/2015: ALT 15; BUN 28*; Creatinine, Ser 1.31*; Potassium 3.7; Sodium 140 11/23/2015: TSH 25.49*   Lipid Panel    Component Value Date/Time   CHOL 246* 11/20/2014 0733   TRIG 161.0* 11/20/2014 0733   HDL 45.00 11/20/2014 0733   CHOLHDL 5 11/20/2014 0733   VLDL 32.2 11/20/2014 0733   LDLCALC 169* 11/20/2014 0733   LDLDIRECT 172.0 12/06/2013 0737    Additional studies/ records that were reviewed today include:  ECHO, ETT from 2013 reviewed. Normal.     ASSESSMENT:    1. 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  Former smoker   3. Coronary artery calcification   4. Aortic atherosclerosis ([place])   5. Hyperlipidemia   6. Essential hypertension   7. Dilated aortic root ([facility])      PLAN:  In order of problems listed above:  Angina -Prior neck discomfort could be an anginal equivalent especially given her heavy calcification  in the LAD distribution. Prior exercise treadmill test in 2013 showed nonspecific ST-T wave changes. We will go ahead and proceed with nuclear stress test.  Former smoker -Congratulated her on quitting 12/2015.  Aortic atherosclerosis -Encouraged her to use Zetia. Her son will be obtaining this from [place] for her. She did not have an intolerance to this. Prior LDL 110.  Hyperlipidemia -Mild previously, LDL 110. Prior statin intolerances. Pravastatin, Crestor, Lipitor previously tried. Muscle pain. She did not have issues with Zetia however cost was an issue here in the [place]. She will be getting this from [place].  Ascending aortic dilation -4.4 cm, followed by Dr. Cyndia Bent.  Essential hypertension -Medications reviewed. Well-controlled today.     Medication Adjustments/Labs and Tests Ordered: Current medicines are reviewed at length with the patient today.  Concerns regarding medicines are outlined above.  Medication changes, Labs and Tests ordered today are listed in the Patient Instructions below. Patient Instructions  Your physician recommends that you continue on your current medications as directed. Please refer to the Current Medication list given to you today. Your physician has requested that you have a lexiscan myoview. For further information please visit [email or web address]. Please follow instruction sheet, as given.  Your physician wants you to follow-up in: 1 year with Dr. Marlou Porch.   You will receive a reminder letter in the mail two months in advance. If you don't receive a letter, please call our office to schedule the follow-up appointment.         Bobby Rumpf, MD  03/19/2016 10:34 AM    [facility] [place], [place], [place]  [address] Phone: [phone]; Fax: [phone]

## 2016-03-19 NOTE — Patient Instructions (Signed)
Your physician recommends that you continue on your current medications as directed. Please refer to the Current Medication list given to you today. Your physician has requested that you have a lexiscan myoview. For further information please visit [email or web address]. Please follow instruction sheet, as given.  Your physician wants you to follow-up in: 1 year with Dr. Marlou Porch.   You will receive a reminder letter in the mail two months in advance. If you don't receive a letter, please call our office to schedule the follow-up appointment.

## 2016-03-20 ENCOUNTER — Telehealth (HOSPITAL_COMMUNITY): Payer: Self-pay | Admitting: *Deleted

## 2016-03-20 NOTE — Telephone Encounter (Signed)
Patient given detailed instructions per Myocardial Perfusion Study Information Sheet for the test on 03/25/16 at 0715. Patient notified to arrive 15 minutes early and that it is imperative to arrive on time for appointment to keep from having the test rescheduled.  If you need to cancel or reschedule your appointment, please call the office within 24 hours of your appointment. Failure to do so may result in a cancellation of your appointment, and a $50 no show fee. Patient verbalized understanding.Shakir Petrosino, [place]e [place]

## 2016-03-25 ENCOUNTER — Ambulatory Visit (HOSPITAL_COMMUNITY): Payer: Medicare Other | Attending: Cardiology

## 2016-03-25 DIAGNOSIS — E785 Hyperlipidemia, unspecified: Secondary | ICD-10-CM | POA: Diagnosis not present

## 2016-03-25 DIAGNOSIS — R5383 Other fatigue: Secondary | ICD-10-CM | POA: Diagnosis not present

## 2016-03-25 DIAGNOSIS — I700 Atherosclerosis of aorta: Secondary | ICD-10-CM | POA: Diagnosis not present

## 2016-03-25 DIAGNOSIS — R002 Palpitations: Secondary | ICD-10-CM | POA: Diagnosis not present

## 2016-03-25 DIAGNOSIS — I2510 Atherosclerotic heart disease of native coronary artery without angina pectoris: Secondary | ICD-10-CM | POA: Insufficient documentation

## 2016-03-25 DIAGNOSIS — I77810 Thoracic aortic ectasia: Secondary | ICD-10-CM | POA: Diagnosis not present

## 2016-03-25 DIAGNOSIS — I208 Other forms of angina pectoris: Secondary | ICD-10-CM | POA: Insufficient documentation

## 2016-03-25 DIAGNOSIS — I10 Essential (primary) hypertension: Secondary | ICD-10-CM | POA: Diagnosis not present

## 2016-03-25 DIAGNOSIS — R0609 Other forms of dyspnea: Secondary | ICD-10-CM | POA: Insufficient documentation

## 2016-03-25 DIAGNOSIS — I2584 Coronary atherosclerosis due to calcified coronary lesion: Secondary | ICD-10-CM | POA: Diagnosis not present

## 2016-03-25 DIAGNOSIS — Z87891 Personal history of nicotine dependence: Secondary | ICD-10-CM | POA: Diagnosis not present

## 2016-03-25 LAB — MYOCARDIAL PERFUSION IMAGING
CHL CUP NUCLEAR SDS: 5
CHL CUP NUCLEAR SRS: 0
CHL CUP RESTING HR STRESS: 66 {beats}/min
LV dias vol: 61 mL (ref 46–106)
LV sys vol: 22 mL
Peak HR: 83 {beats}/min
RATE: 0.32
SSS: 5
TID: 1.1

## 2016-03-25 MED ORDER — TECHNETIUM TC 99M SESTAMIBI GENERIC - CARDIOLITE
31.0000 | Freq: Once | INTRAVENOUS | Status: AC | PRN
  Administered 2016-03-25: 31 via INTRAVENOUS

## 2016-03-25 MED ORDER — TECHNETIUM TC 99M SESTAMIBI GENERIC - CARDIOLITE
10.3000 | Freq: Once | INTRAVENOUS | Status: AC | PRN
  Administered 2016-03-25: 10 via INTRAVENOUS

## 2016-03-25 MED ORDER — REGADENOSON 0.4 MG/5ML IV SOLN
0.4000 mg | Freq: Once | INTRAVENOUS | Status: AC
  Administered 2016-03-25: 0.4 mg via INTRAVENOUS

## 2016-04-08 DIAGNOSIS — H903 Sensorineural hearing loss, bilateral: Secondary | ICD-10-CM | POA: Diagnosis not present

## 2016-04-18 ENCOUNTER — Other Ambulatory Visit (INDEPENDENT_AMBULATORY_CARE_PROVIDER_SITE_OTHER): Payer: Medicare Other

## 2016-04-18 ENCOUNTER — Ambulatory Visit (INDEPENDENT_AMBULATORY_CARE_PROVIDER_SITE_OTHER): Payer: Medicare Other | Admitting: Internal Medicine

## 2016-04-18 ENCOUNTER — Encounter: Payer: Self-pay | Admitting: Internal Medicine

## 2016-04-18 VITALS — BP 132/74 | HR 68 | Temp 98.5°F | Resp 18 | Ht 61.5 in | Wt 135.0 lb

## 2016-04-18 DIAGNOSIS — R3129 Other microscopic hematuria: Secondary | ICD-10-CM | POA: Diagnosis not present

## 2016-04-18 DIAGNOSIS — K645 Perianal venous thrombosis: Secondary | ICD-10-CM

## 2016-04-18 DIAGNOSIS — R159 Full incontinence of feces: Secondary | ICD-10-CM

## 2016-04-18 LAB — URINALYSIS, ROUTINE W REFLEX MICROSCOPIC
BILIRUBIN URINE: NEGATIVE
KETONES UR: NEGATIVE
Leukocytes, UA: NEGATIVE
NITRITE: NEGATIVE
Specific Gravity, Urine: 1.015 (ref 1.000–1.030)
Total Protein, Urine: NEGATIVE
Urine Glucose: NEGATIVE
Urobilinogen, UA: 0.2 (ref 0.0–1.0)
pH: 6 (ref 5.0–8.0)

## 2016-04-18 NOTE — Assessment & Plan Note (Signed)
Has been worse the last several months since the problems with the hemorrhoids.

## 2016-04-18 NOTE — Progress Notes (Signed)
Pre visit review using our clinic review tool, if applicable. No additional management support is needed unless otherwise documented below in the visit note. 

## 2016-04-18 NOTE — Assessment & Plan Note (Signed)
Will refer to GI to see if some pelvic floor training would be helpful.

## 2016-04-18 NOTE — Patient Instructions (Signed)
We will check the urine and send you the results.

## 2016-04-18 NOTE — Progress Notes (Signed)
   Subjective:    Patient ID: Rachel Gardner, female    DOB: 06-04-1946, 70 y.o.   MRN: [id number]  HPI The patient is a 70 YO female coming in for some fecal incontinence. She had fistula long ago which healed and then she did some PT to help out. She had hemorrhoid recently which has left a spot on her anus which is soft. This spot leaks some and is hard to fully evacuate. She has manually disimpacted that spot the last several months.   Review of Systems  Constitutional: Negative for fever, activity change, appetite change, fatigue and unexpected weight change.  Respiratory: Negative for cough, chest tightness, shortness of breath and wheezing.   Cardiovascular: Negative for chest pain, palpitations and leg swelling.  Gastrointestinal: Negative for abdominal pain and abdominal distention.  Musculoskeletal: Negative for back pain, arthralgias and neck pain.  Neurological: Negative for dizziness, tremors, weakness, light-headedness, numbness and headaches.  Psychiatric/Behavioral: Negative.       Objective:   Physical Exam  Constitutional: She is oriented to person, place, and time. She appears well-developed and well-nourished.  HENT:  Head: Normocephalic and atraumatic.  Eyes: EOM are normal.  Neck: Normal range of motion.  Cardiovascular: Normal rate and regular rhythm.   Pulmonary/Chest: Effort normal and breath sounds normal. No respiratory distress. She has no wheezes. She has no rales.  Abdominal: Soft. Bowel sounds are normal. She exhibits no distension. There is no tenderness. There is no rebound.  Musculoskeletal: She exhibits no edema.  Lymphadenopathy:    She has no cervical adenopathy.  Neurological: She is alert and oriented to person, place, and time. Coordination normal.  Skin: Skin is warm and dry.   Filed Vitals:   04/18/16 1325  BP: 132/74  Pulse: 68  Temp: 98.5 F (36.9 C)  TempSrc: Oral  Resp: 18  Height: 5' 1.5" (1.562 m)  Weight: 135 lb (61.236 kg)    SpO2: 94%      Assessment & Plan:

## 2016-04-23 ENCOUNTER — Encounter: Payer: Self-pay | Admitting: Internal Medicine

## 2016-04-24 ENCOUNTER — Telehealth: Payer: Self-pay | Admitting: Emergency Medicine

## 2016-04-24 ENCOUNTER — Encounter: Payer: Self-pay | Admitting: Emergency Medicine

## 2016-05-05 ENCOUNTER — Ambulatory Visit: Payer: Medicare Other | Admitting: Internal Medicine

## 2016-05-08 DIAGNOSIS — R3129 Other microscopic hematuria: Secondary | ICD-10-CM | POA: Diagnosis not present

## 2016-05-09 ENCOUNTER — Encounter: Payer: Self-pay | Admitting: Internal Medicine

## 2016-05-13 ENCOUNTER — Encounter: Payer: Self-pay | Admitting: Endocrinology

## 2016-05-13 ENCOUNTER — Ambulatory Visit (INDEPENDENT_AMBULATORY_CARE_PROVIDER_SITE_OTHER): Payer: Medicare Other | Admitting: Endocrinology

## 2016-05-13 VITALS — BP 124/74 | HR 94 | Temp 98.4°F | Ht 61.5 in | Wt 136.0 lb

## 2016-05-13 DIAGNOSIS — E039 Hypothyroidism, unspecified: Secondary | ICD-10-CM | POA: Diagnosis not present

## 2016-05-13 LAB — TSH: TSH: 0.15 u[IU]/mL — AB (ref 0.35–4.50)

## 2016-05-13 NOTE — Patient Instructions (Signed)
blood tests are requested for you today.  We'll let you know about the results.  Over time, the thyroid sometimes grows back.  If this happens, you would notice that you need less medication. Please come back for a follow-up appointment in 6 months.

## 2016-05-13 NOTE — Progress Notes (Signed)
Subjective:    Patient ID: Rachel Gardner, female    DOB: 1946-10-10, 70 y.o.   MRN: [id number]  HPI Pt returns for f/u of postsurgical hypothyroidism (she had thyroidectomy in 2005, for a goiter--was benign on pathology.  she has been on prescribed thyroid hormone therapy since then.  Synthroid dosage has varies from 125 to 200 mcg/d.  pt states she feels well in general.  Specifically, leg cramps are better.  She quit smoking 4 months ago.  Past Medical History  Diagnosis Date  . Hyperlipidemia   . Blood in stool   . Diverticulitis   . GERD (gastroesophageal reflux disease)   . Allergy     hay fever  . Hypertension   . Kidney stones   . 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 Helicobacter pylori infection   . Hypothyroidism   . Obstructive sleep apnea   . Genital herpes   . Fatty liver 05/28/10    per CT at [facility]  . Hiatal hernia 05/28/10    CT @ [facility]  . Meniere's disease   . BPPV (benign paroxysmal positional vertigo)   . Ascending aortic aneurysm ([place])   . Lung nodule seen on imaging study   . Glaucoma     Past Surgical History  Procedure Laterality Date  . Cholecystectomy  1989  . Appendectomy  1989  . Abdominal hysterectomy  1995  . Total thyroidectomy  1995  . Breast surgery  1989    breast reduction  . 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 Laparoscopic sigmoid colectomy  2012    with takedown of splenic flexure; cystoscopy with bilateral ureteral stent placement  . Bppv      x 4 after L cochlear implant    Social History   Social History  . Marital Status: Divorced    Spouse Name: N/A  . Number of Children: 3  . Years of Education: N/A   Occupational History  . retired Marine scientist    Social History Main Topics  . Smoking status: Former Smoker -- 0.50 packs/day for 41 years    Types: Cigarettes    Quit date: 01/18/2016  . Smokeless tobacco: Never Used  . Alcohol Use: 0.0 oz/week    0 Standard drinks or equivalent  per week     Comment: rarely  . Drug Use: No  . Sexual Activity: No   Other Topics Concern  . Not on file   Social History Narrative   Regular exercise: walk; 1 mile a day / walk 5 miles weekend;    Caffeine use: 1 cup of coffee in the AM      3 children          Current Outpatient Prescriptions on File Prior to Visit  Medication Sig Dispense Refill  . ascorbic acid (VITAMIN C) 1000 MG tablet Take 500 mg by mouth daily.     Marland Kitchen b complex vitamins tablet Take 1 tablet by mouth daily.    . bimatoprost (LUMIGAN) 0.01 % SOLN Place 1 drop into both eyes at bedtime.    . Calcium Carb-Cholecalciferol (CALCIUM 500 + D3) 500-600 MG-UNIT TABS Take by mouth.    . ezetimibe (ZETIA) 10 MG tablet Take 1 tablet (10 mg total) by mouth daily. 90 tablet 3  . lactobacillus acidophilus (BACID) TABS tablet Take 1 tablet by mouth daily. 20 billions units per day    . levothyroxine (SYNTHROID, LEVOTHROID) 150 MCG tablet Take  1 tablet (150 mcg total) by mouth daily. 90 tablet 1  . lidocaine-hydrocortisone (ANAMANTEL HC) 3-0.5 % CREA Place 1 Applicatorful rectally 2 (two) times daily as needed. 35 g 0  . losartan-hydrochlorothiazide (HYZAAR) 100-25 MG per tablet Take 1 tablet by mouth daily.     . Omega-3 Fatty Acids (FISH OIL PO) Take 1 tablet by mouth daily.     No current facility-administered medications on file prior to visit.    Allergies  Allergen Reactions  . Morphine And Related     Nausea Can tolerate Dilaudid  . Statins Other (See Comments)    Joint pain   . Tape Other (See Comments)    Causes redness and will take skin when removed  . Wellbutrin [Bupropion] Other (See Comments)    Suicidal intensions    Family History  Problem Relation Age of Onset  . Hypertension Mother   . Hypertension Father   . Hypertension Sister   . Prostate cancer Brother   . Hyperlipidemia Brother   . Colon cancer Maternal Aunt   . Prostate cancer Paternal Uncle   . Colon cancer Cousin     x 2;  paternal  . Thyroid disease Sister     BP 124/74 mmHg  Pulse 94  Temp(Src) 98.4 F (36.9 C) (Oral)  Ht 5' 1.5" (1.562 m)  Wt 136 lb (61.689 kg)  BMI 25.28 kg/m2  SpO2 96%  Review of Systems Denies cold intolerance.    Objective:   Physical Exam VITAL SIGNS:  See vs page. GENERAL: no distress. NECK: There is no palpable thyroid enlargement.  No thyroid nodule is palpable.  No palpable lymphadenopathy at the anterior neck.     Lab Results  Component Value Date   TSH 0.15* 05/13/2016      Assessment & Plan:  Hypothyroidism: due to variable TSH, goal is to try to minimize # of med adjustments, so we'll continue the same medication for now.   Patient is advised the following: Patient Instructions  blood tests are requested for you today.  We'll let you know about the results.  Over time, the thyroid sometimes grows back.  If this happens, you would notice that you need less medication. Please come back for a follow-up appointment in 6 months.

## 2016-05-30 DIAGNOSIS — H2513 Age-related nuclear cataract, bilateral: Secondary | ICD-10-CM | POA: Diagnosis not present

## 2016-05-30 DIAGNOSIS — H25042 Posterior subcapsular polar age-related cataract, left eye: Secondary | ICD-10-CM | POA: Diagnosis not present

## 2016-05-30 DIAGNOSIS — H401131 Primary open-angle glaucoma, bilateral, mild stage: Secondary | ICD-10-CM | POA: Diagnosis not present

## 2016-06-06 ENCOUNTER — Encounter: Payer: Self-pay | Admitting: Internal Medicine

## 2016-06-06 DIAGNOSIS — R197 Diarrhea, unspecified: Secondary | ICD-10-CM

## 2016-06-13 ENCOUNTER — Encounter: Payer: Self-pay | Admitting: Gastroenterology

## 2016-06-13 ENCOUNTER — Ambulatory Visit (INDEPENDENT_AMBULATORY_CARE_PROVIDER_SITE_OTHER): Payer: Medicare Other | Admitting: Gastroenterology

## 2016-06-13 VITALS — BP 122/74 | HR 96 | Ht 62.0 in | Wt 135.1 lb

## 2016-06-13 DIAGNOSIS — R159 Full incontinence of feces: Secondary | ICD-10-CM | POA: Diagnosis not present

## 2016-06-13 DIAGNOSIS — K6289 Other specified diseases of anus and rectum: Secondary | ICD-10-CM | POA: Diagnosis not present

## 2016-06-13 NOTE — Patient Instructions (Addendum)
We will get the records from physical therapy/ pelvic floor dysfunction.  If you are age 69 or older, your body mass index should be between 23-30. Your Body mass index is 24.71 kg/(m^2). If this is out of the aforementioned range listed, please consider follow up with your Primary Care Provider.  If you are age 97 or younger, your body mass index should be between 19-25. Your Body mass index is 24.71 kg/(m^2). If this is out of the aformentioned range listed, please consider follow up with your Primary Care Provider.   Thank you for choosing [facility]  Dr Wilfrid Lund III

## 2016-06-13 NOTE — Progress Notes (Signed)
[facility] Progress Note  Chief Complaint: Fecal incontinence  Subjective History:  This is a 70 year old woman last seen by Dr. Olevia Perches in February 2016 for diarrhea and fecal incontinence. Dr. Olevia Perches felt she most likely had pelvic floor dysfunction, and sent her to that clinic. I cannot seem to locate those records at this point, but the patient describes having what sounds like possibly an anorectal motility study and then multiple sessions of "exercises". She states the problem was better after that, but has worsened in the last 5 months. She attributes that to having had a prolapsed hemorrhoid. She also has some intermittent left lower quadrant pain that comes along with urgency at times. She will have incontinence of stool several days a week, and not necessarily just with loose stool.  ROS: Cardiovascular:  no chest pain Respiratory: no dyspnea  The patient's Past Medical, Family and Social History were reviewed and are on file in the EMR.  Objective:  Med list reviewed  Vital signs in last 24 hrs: Filed Vitals:   06/13/16 0833  BP: 122/74  Pulse: 96    Physical Exam   HEENT: sclera anicteric, oral mucosa moist without lesions  Neck: supple, no thyromegaly, JVD or lymphadenopathy  Cardiac: RRR without murmurs, S1S2 heard, no peripheral edema  Pulm: clear to auscultation bilaterally, normal RR and effort noted  Abdomen: soft, Mild LLQ tenderness, with active bowel sounds. No guarding or palpable hepatosplenomegaly.  Skin; warm and dry, no jaundice or rash   Rectal exam done in the presence of our MA Dottie reveals weak resting anal sphincter tone and poor voluntary sphincter tone including poor contraction of the puborectalis    @ASSESSMENTPLANBEGIN @ Assessment: Encounter Diagnosis  Name Primary?  . 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  Plan: If she had a  motility study done, we will obtain the report and review it. If not we will schedule one.  She will then most likely need a referral to colorectal surgery   Nelida Meuse III

## 2016-06-20 NOTE — Telephone Encounter (Signed)
A user error has taken place: Not sure why encounter still open since April. Closing encounter.Marland KitchenJohny Chess

## 2016-07-10 ENCOUNTER — Encounter: Payer: Self-pay | Admitting: Gastroenterology

## 2016-07-10 ENCOUNTER — Other Ambulatory Visit: Payer: Self-pay

## 2016-07-10 NOTE — Telephone Encounter (Signed)
Rachel Gardner did you ever get any records regarding the PT/pelvic floor dysfunction

## 2016-07-15 ENCOUNTER — Encounter: Payer: Self-pay | Admitting: Gastroenterology

## 2016-07-15 ENCOUNTER — Other Ambulatory Visit: Payer: Self-pay | Admitting: Internal Medicine

## 2016-07-15 ENCOUNTER — Encounter: Payer: Self-pay | Admitting: Internal Medicine

## 2016-07-15 ENCOUNTER — Telehealth: Payer: Self-pay | Admitting: Gastroenterology

## 2016-07-15 MED ORDER — VALACYCLOVIR HCL 1 G PO TABS: 1000.0000 mg | ORAL_TABLET | Freq: Two times a day (BID) | ORAL | Status: DC

## 2016-07-15 NOTE — Telephone Encounter (Signed)
I received Rachel Gardner' messages today, after having been on the hospital service all last week. I know this problem is causing her distress and I certainly had not forgotten about her. While I was away we received the clinic notes from the urologist with what appears to be their exam in 2016, and so I reviewed them today. They appear to have done a physical exam and perhaps recommended certain kinds of exercises because they felt she probably had pelvic floor dysfunction. However, she needs a more clear diagnosis by having testing called anorectal motility. Otherwise, it is difficult for me to know how to proceed. Certain kinds of structural problems might require surgery, while if they are muscle or nerve dysfunction problems, they may only respond to exercises called biofeedback. If she would like to pursue this, please set up an anorectal motility test in the [facility] lab, and Dr. Silverio Decamp will review the results. If she prefers, I believe there is a pelvic floor dysfunction at [facility] if she would like another opinion on the matter. However she would like to proceed is fine with me.

## 2016-07-16 NOTE — Telephone Encounter (Signed)
Expand All Collapse All   I received Ms. Rachel Gardner' messages today, after having been on the hospital service all last week. I know this problem is causing her distress and I certainly had not forgotten about her. While I was away we received the clinic notes from the urologist with what appears to be their exam in 2016, and so I reviewed them today. They appear to have done a physical exam and perhaps recommended certain kinds of exercises because they felt she probably had pelvic floor dysfunction. However, she needs a more clear diagnosis by having testing called anorectal motility. Otherwise, it is difficult for me to know how to proceed. Certain kinds of structural problems might require surgery, while if they are muscle or nerve dysfunction problems, they may only respond to exercises called biofeedback. If she would like to pursue this, please set up an anorectal motility test in the [facility] lab, and Dr. Silverio Decamp will review the results. If she prefers, I believe there is a pelvic floor dysfunction at [facility] if she would like another opinion on the matter. However she would like to proceed is fine with me.       Here is Dr Loletha Carrow review of your records. Please let us know how you would like to proceed. Thank you for you patience in this matter.  Please let me know what you would like to do from here. [name], CMA

## 2016-07-16 NOTE — Telephone Encounter (Signed)
Information was sent to pt via mychart. Will await to hear back from pt with her decision.

## 2016-07-18 ENCOUNTER — Other Ambulatory Visit: Payer: Self-pay

## 2016-08-06 ENCOUNTER — Ambulatory Visit (HOSPITAL_COMMUNITY)
Admission: RE | Admit: 2016-08-06 | Discharge: 2016-08-06 | Disposition: A | Discharge status: Home / Self Care | Payer: Medicare Other | Source: Ambulatory Visit | Attending: Gastroenterology | Admitting: Gastroenterology

## 2016-08-06 ENCOUNTER — Encounter (HOSPITAL_COMMUNITY)
Admission: RE | Disposition: A | Discharge status: Home / Self Care | Payer: Self-pay | Source: Ambulatory Visit | Attending: Gastroenterology

## 2016-08-06 DIAGNOSIS — R159 Full incontinence of feces: Secondary | ICD-10-CM | POA: Diagnosis not present

## 2016-08-06 HISTORY — PX: ANAL RECTAL MANOMETRY: SHX6358

## 2016-08-06 SURGERY — MANOMETRY, ANORECTAL

## 2016-08-06 NOTE — H&P (Signed)
  This patient was in the endoscopy lab for an ano-rectal motility test performed by nursing staff.  See recent GI office note for details.  There is no face-to-face interaction with the physician the day of this procedure.

## 2016-08-06 NOTE — Progress Notes (Signed)
Ano-rectal manometry done per protocol. Pt tolerated well without complication. Report to be sent to Dr. Loletha Carrow' office.

## 2016-08-07 ENCOUNTER — Encounter (HOSPITAL_COMMUNITY): Payer: Self-pay | Admitting: Gastroenterology

## 2016-08-15 ENCOUNTER — Encounter: Payer: Self-pay | Admitting: Gastroenterology

## 2016-09-19 ENCOUNTER — Encounter: Payer: Self-pay | Admitting: Internal Medicine

## 2016-09-19 ENCOUNTER — Ambulatory Visit (INDEPENDENT_AMBULATORY_CARE_PROVIDER_SITE_OTHER): Payer: Medicare Other | Admitting: Internal Medicine

## 2016-09-19 VITALS — BP 110/62 | HR 94 | Temp 98.8°F | Resp 12 | Ht 61.0 in | Wt 137.0 lb

## 2016-09-19 DIAGNOSIS — A6000 Herpesviral infection of urogenital system, unspecified: Secondary | ICD-10-CM | POA: Diagnosis not present

## 2016-09-19 DIAGNOSIS — Z23 Encounter for immunization: Secondary | ICD-10-CM | POA: Diagnosis not present

## 2016-09-19 MED ORDER — VALACYCLOVIR HCL 1 G PO TABS: 1000.0000 mg | ORAL_TABLET | Freq: Two times a day (BID) | ORAL | 11 refills | Status: DC

## 2016-09-19 NOTE — Progress Notes (Signed)
Pre visit review using our clinic review tool, if applicable. No additional management support is needed unless otherwise documented below in the visit note. 

## 2016-09-19 NOTE — Patient Instructions (Signed)
We have sent in the valtrex which you can use everyday 1 pill twice a day to keep away the symptoms.

## 2016-09-19 NOTE — Assessment & Plan Note (Signed)
Rx for valtrex as previous treatment helped with lesion as well as some of her rectal symptoms.

## 2016-09-19 NOTE — Progress Notes (Signed)
   Subjective:    Patient ID: Rachel Gardner, female    DOB: June 23, 1946, 70 y.o.   MRN: [id number]  HPI The patient is a 70 YO female coming in for follow up of her genital herpes. She took the valtrex for a short course and it helped with some of her rectal symptoms as well as the outbreak. She is now doing yoga to help with the symptoms. She is getting more symptoms back and wants to know if she can take something all the time to help prevent attacks. Rates the attacks as severe.   Review of Systems  Constitutional: Negative for activity change, appetite change, fatigue, fever and unexpected weight change.  Respiratory: Negative for cough, chest tightness, shortness of breath and wheezing.   Cardiovascular: Negative for chest pain, palpitations and leg swelling.  Gastrointestinal: Negative for abdominal distention and abdominal pain.  Genitourinary:       Genital sore  Musculoskeletal: Negative for arthralgias, back pain and neck pain.  Neurological: Negative for dizziness, tremors, weakness, light-headedness, numbness and headaches.  Psychiatric/Behavioral: Negative.       Objective:   Physical Exam  Constitutional: She is oriented to person, place, and time. She appears well-developed and well-nourished.  HENT:  Head: Normocephalic and atraumatic.  Eyes: EOM are normal.  Neck: Normal range of motion.  Cardiovascular: Normal rate and regular rhythm.   Pulmonary/Chest: Effort normal. No respiratory distress.  Abdominal: Soft. She exhibits no distension. There is no tenderness. There is no rebound.  Genitourinary:  Genitourinary Comments: No inguinal adenopathy, 1 lesion identified which is compatible with herpetic lesion, some pain which is associated. No signs of vaginal infection.   Musculoskeletal: She exhibits no edema.  Lymphadenopathy:    She has no cervical adenopathy.  Neurological: She is alert and oriented to person, place, and time. Coordination normal.  Skin: Skin is  warm and dry.   Vitals:   09/19/16 1425  BP: 110/62  Pulse: 94  Resp: 12  Temp: 98.8 F (37.1 C)  TempSrc: Oral  SpO2: 96%  Weight: 137 lb (62.1 kg)  Height: 5\' 1"  (1.549 m)      Assessment & Plan:  Flu vaccine given at visit.

## 2016-09-24 ENCOUNTER — Ambulatory Visit: Payer: Self-pay | Admitting: Internal Medicine

## 2016-10-01 DIAGNOSIS — H9313 Tinnitus, bilateral: Secondary | ICD-10-CM | POA: Diagnosis not present

## 2016-10-01 DIAGNOSIS — H829 Vertiginous syndromes in diseases classified elsewhere, unspecified ear: Secondary | ICD-10-CM | POA: Diagnosis not present

## 2016-10-01 DIAGNOSIS — H903 Sensorineural hearing loss, bilateral: Secondary | ICD-10-CM | POA: Diagnosis not present

## 2016-10-15 DIAGNOSIS — H903 Sensorineural hearing loss, bilateral: Secondary | ICD-10-CM | POA: Diagnosis not present

## 2016-10-22 ENCOUNTER — Other Ambulatory Visit: Payer: Self-pay | Admitting: Internal Medicine

## 2016-10-22 DIAGNOSIS — Z1231 Encounter for screening mammogram for malignant neoplasm of breast: Secondary | ICD-10-CM

## 2016-10-27 DIAGNOSIS — H2512 Age-related nuclear cataract, left eye: Secondary | ICD-10-CM | POA: Diagnosis not present

## 2016-10-27 DIAGNOSIS — H25042 Posterior subcapsular polar age-related cataract, left eye: Secondary | ICD-10-CM | POA: Diagnosis not present

## 2016-10-29 DIAGNOSIS — H903 Sensorineural hearing loss, bilateral: Secondary | ICD-10-CM | POA: Diagnosis not present

## 2016-10-30 ENCOUNTER — Ambulatory Visit: Payer: Medicare Other

## 2016-11-03 ENCOUNTER — Ambulatory Visit
Admission: RE | Admit: 2016-11-03 | Discharge: 2016-11-03 | Disposition: A | Discharge status: Home / Self Care | Payer: Medicare Other | Source: Ambulatory Visit | Attending: Internal Medicine | Admitting: Internal Medicine

## 2016-11-03 DIAGNOSIS — Z1231 Encounter for screening mammogram for malignant neoplasm of breast: Secondary | ICD-10-CM

## 2016-11-05 DIAGNOSIS — H25042 Posterior subcapsular polar age-related cataract, left eye: Secondary | ICD-10-CM | POA: Diagnosis not present

## 2016-11-05 DIAGNOSIS — H2513 Age-related nuclear cataract, bilateral: Secondary | ICD-10-CM | POA: Diagnosis not present

## 2016-11-06 ENCOUNTER — Encounter: Payer: Self-pay | Admitting: Neurology

## 2016-11-06 ENCOUNTER — Ambulatory Visit (INDEPENDENT_AMBULATORY_CARE_PROVIDER_SITE_OTHER): Payer: Medicare Other | Admitting: Neurology

## 2016-11-06 VITALS — BP 139/82 | HR 96 | Resp 20 | Ht 61.0 in | Wt 139.0 lb

## 2016-11-06 DIAGNOSIS — R55 Syncope and collapse: Secondary | ICD-10-CM

## 2016-11-06 DIAGNOSIS — R42 Dizziness and giddiness: Secondary | ICD-10-CM

## 2016-11-06 DIAGNOSIS — R2689 Other abnormalities of gait and mobility: Secondary | ICD-10-CM | POA: Diagnosis not present

## 2016-11-06 DIAGNOSIS — G458 Other transient cerebral ischemic attacks and related syndromes: Secondary | ICD-10-CM

## 2016-11-06 NOTE — Progress Notes (Signed)
[facility]    Provider:  Dr Jaynee Eagles Referring Provider: Dr. Thornell Mule, ENT Primary Care Physician:  Hoyt Koch, MD  CC:  Sudden onset Dizziness and Nausea  HPI:  Rachel Gardner is a lovely 70 y.o. female here as a referral from Dr. Thornell Mule  for dizziness, imbalance. She has a past medical history of hypertension, high cholesterol, glaucoma, cataracts, sleep apnea, cochlear implant, bilateral sensorineural hearing loss, tinnitus bilateral, vertiginous syndrome, hypothyroidism, BPPV. She is currently under a lot stress. Patient and son have intense conversation today, she cries and admits she is depressed and she is smoking again. Patient was in yoga class  6 weeks ago, her head was below the body and she became dizzy and nauseated. She has a history of bppv. She has tinnitus happens 1-2x a day and lasts a few seconds, the dizziness only started after the yoga class, she describes the dizziness like she is "floating" she loses her balance, like she "misses air", like her body is moving, lasts seconds but happens every day and more associated with getting up quickly or quick movements. She lost her balance when she got up quickly and hit her toe on the corner of her shelf but no fall. Sometime she is taking a shower and if drop something difficult for her to pick it up and difficult to get up. Son is here and provides information. She has not "worked out" in years, she is very complacent, yoga was her attempt to restart physical activity and unfortunately she became dizzy.  She feels nauseated when she bends over, when she moves her head to the right she feels nauseated. The tinnitus started at yoga too. Also balance loss but she attributed it to age and disuse, when she gets up quickly she staggers or when turns her head too quickly. She has two dogs and when she bends down to pick up the dog's food and when she gets up she needs to hold onto somethine. She c/o chronic double vision  after cataract removal. She smokes currently. Son is here and endorses her smoking. She is under a lot of stress, no headaches or migraines, no other associated symptoms or modifying factors.  Reviewed notes, labs and imaging from outside physicians, which showed:  Personally reviewed imaging and agree with the following CT head 2015: FINDINGS: There is artifact bilaterally from cochlear implants. Study is therefore somewhat less than optimal due to susceptibility artifact from the cochlear implants. The ventricles are normal in size and configuration. There is no demonstrable mass, hemorrhage, extra-axial fluid collection, or midline shift. No focal gray-white compartments are appreciable. No acute infarct is evident. The bony calvarium appears intact. There is opacification of several inferior mastoids on the right. Mastoids elsewhere clear. As noted above, cochlear implants are present bilaterally.  IMPRESSION: Study limited due to cochlear implants bilaterally with associated magnetic susceptibility artifact. Allowing for areas of limited visualization due to the artifact from the cochlear implants, no focal intracranial lesion is appreciable. No mass, hemorrhage, or extra-axial fluid collection is appreciable in particular. Note that there is opacification of multiple inferior mastoid air cells on the right.  Review of Systems: Patient complains of symptoms per HPI as well as the following symptoms: Double vision, snoring, incontinence, hearing loss, ringing in ears, cramps, dizziness, insomnia, snoring, disinterest in activities. Pertinent negatives per HPI. All others negative.   Social History   Social History  . Marital status: Divorced    Spouse name: N/A  . Number  of children: 3  . Years of education: N/A   Occupational History  . retired Marine scientist    Social History Main Topics  . Smoking status: Former Smoker    Packs/day: 0.50    Years: 41.00    Types: Cigarettes     Quit date: 01/18/2016  . Smokeless tobacco: Never Used  . Alcohol use 0.0 oz/week     Comment: rarely  . Drug use: No  . Sexual activity: No   Other Topics Concern  . Not on file   Social History Narrative   Regular exercise: walk; 1 mile a day / walk 5 miles weekend;    Caffeine use: 1 cup of coffee in the AM      3 children          Family History  Problem Relation Age of Onset  . Hypertension Mother   . Hypertension Father   . Hypertension Sister   . Prostate cancer Brother   . Hyperlipidemia Brother   . Colon cancer Maternal Aunt   . Prostate cancer Paternal Uncle   . Colon cancer Cousin     x 2; paternal  . Thyroid disease Sister     Past Medical History:  Diagnosis Date  . Allergy    hay fever  . Ascending aortic aneurysm ([place])   . Blood in stool   . BPPV (benign paroxysmal positional vertigo)   . Diverticulitis   . Fatty liver 05/28/10   per [facility] at [facility]  . Genital herpes   . GERD (gastroesophageal reflux disease)   . Glaucoma   . Helicobacter pylori infection   . Hiatal hernia 05/28/10   CT @ [facility]  . Hyperlipidemia   . Hypertension   . Hypothyroidism   . 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 Kidney stones   . Lung nodule seen on imaging study   . Meniere's disease   . Obstructive sleep apnea     Past Surgical History:  Procedure Laterality Date  . ABDOMINAL HYSTERECTOMY  1995  . ANAL RECTAL MANOMETRY N/A 08/06/2016   Procedure: ANO RECTAL MANOMETRY;  Surgeon: Doran Stabler, MD;  Location: [facility];  Service: Gastroenterology;  Laterality: N/A;  . APPENDECTOMY  1989  . BPPV     x 4 after L cochlear implant  . BREAST SURGERY  1989   breast reduction  . CHOLECYSTECTOMY  1989  . COCHLEAR IMPLANT     Left/2009; Right/2012  . LAPAROSCOPIC SIGMOID COLECTOMY  2012   with takedown of splenic flexure; cystoscopy with bilateral ureteral stent placement  . TOTAL THYROIDECTOMY  1995    Current Outpatient  Prescriptions  Medication Sig Dispense Refill  . ascorbic acid (VITAMIN C) 1000 MG tablet Take 500 mg by mouth daily.     Marland Kitchen b complex vitamins tablet Take 1 tablet by mouth daily.    . bimatoprost (LUMIGAN) 0.01 % SOLN Place 1 drop into both eyes at bedtime.    . Calcium Carb-Cholecalciferol (CALCIUM 500 + D3) 500-600 MG-UNIT TABS Take by mouth.    . lactobacillus acidophilus (BACID) TABS tablet Take 1 tablet by mouth daily. 20 billions units per day    . levothyroxine (SYNTHROID, LEVOTHROID) 150 MCG tablet Take 1 tablet (150 mcg total) by mouth daily. 90 tablet 1  . losartan-hydrochlorothiazide (HYZAAR) 100-25 MG per tablet Take 1 tablet by mouth daily.     . nicotine polacrilex (COMMIT) 2 MG lozenge Take 2 mg by mouth as  needed for smoking cessation.    . Omega-3 Fatty Acids (FISH OIL PO) Take 1 tablet by mouth daily.     No current facility-administered medications for this visit.     Allergies as of 11/06/2016 - Review Complete 11/06/2016  Allergen Reaction Noted  . Morphine and related  05/24/2014  . Statins Other (See Comments) 08/16/2013  . Tape Other (See Comments) 04/25/2015  . Wellbutrin [bupropion] Other (See Comments) 02/13/2015    Vitals: BP 139/82   Pulse 96   Resp 20   Ht 5\' 1"  (1.549 m)   Wt 139 lb (63 kg)   BMI 26.26 kg/m  Last Weight:  Wt Readings from Last 1 Encounters:  11/06/16 139 lb (63 kg)   Last Height:   Ht Readings from Last 1 Encounters:  11/06/16 5\' 1"  (1.549 m)   Physical exam: Exam: Gen: NAD, conversant, well nourised, obese, well groomed                     CV: RRR, no MRG. No Carotid Bruits. No peripheral edema, warm, nontender Eyes: Conjunctivae clear without exudates or hemorrhage  Neuro: Detailed Neurologic Exam  Speech:    Speech is normal; fluent and spontaneous with normal comprehension.  Cognition:    The patient is oriented to person, place, and time;     recent and remote memory intact;     language fluent;     normal  attention, concentration,     fund of knowledge Cranial Nerves:    The pupils are equal, round, and reactive to light. Attempted funduscopic exam could not visualize. Visual fields are full to finger confrontation. Extraocular movements are intact. Trigeminal sensation is intact and the muscles of mastication are normal. The face is symmetric. The palate elevates in the midline. Hearing intact. Voice is normal. Shoulder shrug is normal. The tongue has normal motion without fasciculations.   Coordination:    Normal finger to nose and heel to shin.  Gait:    Heel-toe and tandem gait are normal.   Motor Observation:    No asymmetry, no atrophy, and no involuntary movements noted. Tone:    Normal muscle tone.    Posture:    Posture is normal. normal erect    Strength:    Strength is V/V in the upper and lower limbs.      Sensation: intact to LT, pin prick, temperature, vibration, proprioception, neg Romberg     Reflex Exam:  DTR's:    Deep tendon reflexes in the upper and lower extremities are normal bilaterally.   Toes:    The toes are downgoing bilaterally.   Clonus:    Clonus is absent.       Assessment/Plan:    Rachel Gardner is a 70 y.o. female here as a referral from Dr. Thornell Mule for dizziness, imbalance.She is currently under a lot stress. Patient and son have intense conversation today, she cries in the office and admits she is depressed and she is smoking again. Patient was in yoga class  6 weeks ago, her head was below the body and she became dizzy and nauseated. Neurologic exam is nonfocal.  Depression, stress: She is crying in the office today, admits she is depressed and stressed. Discussed with son and patient that she should seek therapy and discuss with primary care possible medication treatment for her depression such as an SSRI.   Vascular risk factors for stroke: She should stop smoking. She should take a daily baby  aspirin for stroke prevention. She needs to  follow with primary care for management of vascular risk factors for stroke such as smoking cessation, cholesterol and blood pressure management, monitoring for diabetes. Patient has a history of sleep apnea, she should follow with her sleep doctor in the past or we can discuss a new study in the future if needed.  Dizziness, imbalance: We'll order physical therapy for both safety and gait, balance and vestibular exercises. Discussed regular exercise and looking into Silver Sneakers. Will order CT head.  CTA of the head and neck to evaluate for intracranial stenosis or carotid stenosis or other intracranial or vascular etiologies for her dizziness, postural dizziness with pre-syncope, lightheadedness. Will check labs today.   Orthostatic vital signs are normal in the office today. Discussed hydration and good diet.   Patient will follow-up with me after physical therapy.  Cc: Dr. Thornell Mule, Dr. Laney Potash, [facility] [address] [place], [place] [address]  Phone [phone] Fax [phone]

## 2016-11-06 NOTE — Patient Instructions (Signed)
Remember to drink plenty of fluid, eat healthy meals and do not skip any meals. Try to eat protein with a every meal and eat a healthy snack such as fruit or nuts in between meals. Try to keep a regular sleep-wake schedule and try to exercise daily, particularly in the form of walking, 20-30 minutes a day, if you can.   As far as your medications are concerned, I would like to suggest: Daily baby aspirin, follow up with primary care to discuss smoking cessation, blood pressure, cholesterol  As far as diagnostic testing: Labs, imaging of the head or brain  I would like to see you back after vestibular therapy if needed, sooner if we need to. Please call us with any interim questions, concerns, problems, updates or refill requests.   Our phone number is [phone]. We also have an after hours call service for urgent matters and there is a physician on-call for urgent questions. For any emergencies you know to call 911 or go to the nearest emergency room

## 2016-11-07 ENCOUNTER — Encounter: Payer: Self-pay | Admitting: Internal Medicine

## 2016-11-07 ENCOUNTER — Telehealth: Payer: Self-pay | Admitting: *Deleted

## 2016-11-07 DIAGNOSIS — I712 Thoracic aortic aneurysm, without rupture, unspecified: Secondary | ICD-10-CM

## 2016-11-07 LAB — BASIC METABOLIC PANEL
BUN/Creatinine Ratio: 22 (ref 12–28)
BUN: 19 mg/dL (ref 8–27)
CALCIUM: 9.3 mg/dL (ref 8.7–10.3)
CHLORIDE: 103 mmol/L (ref 96–106)
CO2: 27 mmol/L (ref 18–29)
Creatinine, Ser: 0.87 mg/dL (ref 0.57–1.00)
GFR calc Af Amer: 79 mL/min/{1.73_m2} (ref >59)
GFR calc non Af Amer: 68 mL/min/{1.73_m2} (ref >59)
Glucose: 84 mg/dL (ref 65–99)
POTASSIUM: 4.5 mmol/L (ref 3.5–5.2)
Sodium: 144 mmol/L (ref 134–144)

## 2016-11-07 NOTE — Telephone Encounter (Signed)
-----   Message from Melvenia Beam, MD sent at 11/07/2016  8:05 AM EST ----- Labs normal

## 2016-11-07 NOTE — Telephone Encounter (Signed)
Tried calling son. Call got disconnected. Ok to inform him mother's labs are normal per AA,MD if he calls back.

## 2016-11-07 NOTE — Telephone Encounter (Signed)
Pt's son called back, he was advised the labs are normal. He was appreciative

## 2016-11-11 ENCOUNTER — Encounter: Payer: Self-pay | Admitting: Neurology

## 2016-11-13 ENCOUNTER — Ambulatory Visit (INDEPENDENT_AMBULATORY_CARE_PROVIDER_SITE_OTHER): Payer: Medicare Other | Admitting: Endocrinology

## 2016-11-13 ENCOUNTER — Ambulatory Visit: Payer: Medicare Other | Admitting: Endocrinology

## 2016-11-13 ENCOUNTER — Encounter: Payer: Self-pay | Admitting: Endocrinology

## 2016-11-13 VITALS — BP 122/82 | HR 93 | Ht 61.0 in | Wt 138.0 lb

## 2016-11-13 DIAGNOSIS — E039 Hypothyroidism, unspecified: Secondary | ICD-10-CM | POA: Diagnosis not present

## 2016-11-13 LAB — TSH: TSH: 0.31 u[IU]/mL — AB (ref 0.35–4.50)

## 2016-11-13 MED ORDER — LEVOTHYROXINE SODIUM 125 MCG PO TABS: 125.0000 ug | ORAL_TABLET | Freq: Every day | ORAL | 3 refills | Status: DC

## 2016-11-13 NOTE — Progress Notes (Signed)
Subjective:    Patient ID: Rachel Gardner, female    DOB: Mar 01, 1946, 70 y.o.   MRN: [id number]  HPI Pt returns for f/u of postsurgical hypothyroidism (she had thyroidectomy in 2005, for a goiter--was benign on pathology.  she has been on prescribed thyroid hormone therapy since then.  Synthroid dosage has varied from 125 to 200 mcg/d.  pt states she feels well in general.  Past Medical History:  Diagnosis Date  . Allergy    hay fever  . Ascending aortic aneurysm ([place])   . Blood in stool   . BPPV (benign paroxysmal positional vertigo)   . Diverticulitis   . Fatty liver 05/28/10   per CT at [facility]  . Genital herpes   . GERD (gastroesophageal reflux disease)   . Glaucoma   . Helicobacter pylori infection   . Hiatal hernia 05/28/10   CT @ [facility]  . Hyperlipidemia   . Hypertension   . Hypothyroidism   . 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 Kidney stones   . Lung nodule seen on imaging study   . Meniere's disease   . Obstructive sleep apnea     Past Surgical History:  Procedure Laterality Date  . ABDOMINAL HYSTERECTOMY  1995  . ANAL RECTAL MANOMETRY N/A 08/06/2016   Procedure: ANO RECTAL MANOMETRY;  Surgeon: Doran Stabler, MD;  Location: [facility];  Service: Gastroenterology;  Laterality: N/A;  . APPENDECTOMY  1989  . BPPV     x 4 after L cochlear implant  . BREAST SURGERY  1989   breast reduction  . CHOLECYSTECTOMY  1989  . COCHLEAR IMPLANT     Left/2009; Right/2012  . LAPAROSCOPIC SIGMOID COLECTOMY  2012   with takedown of splenic flexure; cystoscopy with bilateral ureteral stent placement  . TOTAL THYROIDECTOMY  1995    Social History   Social History  . Marital status: Divorced    Spouse name: N/A  . Number of children: 3  . Years of education: N/A   Occupational History  . retired Marine scientist    Social History Main Topics  . Smoking status: Former Smoker    Packs/day: 0.50    Years: 41.00    Types: Cigarettes    Quit date:  01/18/2016  . Smokeless tobacco: Never Used  . Alcohol use 0.0 oz/week     Comment: rarely  . Drug use: No  . Sexual activity: No   Other Topics Concern  . Not on file   Social History Narrative   Regular exercise: walk; 1 mile a day / walk 5 miles weekend;    Caffeine use: 1 cup of coffee in the AM      3 children          Current Outpatient Prescriptions on File Prior to Visit  Medication Sig Dispense Refill  . ascorbic acid (VITAMIN C) 1000 MG tablet Take 500 mg by mouth daily.     Marland Kitchen b complex vitamins tablet Take 1 tablet by mouth daily.    . bimatoprost (LUMIGAN) 0.01 % SOLN Place 1 drop into both eyes at bedtime.    . Calcium Carb-Cholecalciferol (CALCIUM 500 + D3) 500-600 MG-UNIT TABS Take by mouth.    . lactobacillus acidophilus (BACID) TABS tablet Take 1 tablet by mouth daily. 20 billions units per day    . losartan-hydrochlorothiazide (HYZAAR) 100-25 MG per tablet Take 1 tablet by mouth daily.     . nicotine polacrilex (COMMIT) 2 MG  lozenge Take 2 mg by mouth as needed for smoking cessation.    . Omega-3 Fatty Acids (FISH OIL PO) Take 1 tablet by mouth daily.     No current facility-administered medications on file prior to visit.     Allergies  Allergen Reactions  . Morphine And Related     Nausea Can tolerate Dilaudid  . Statins Other (See Comments)    Joint pain   . Tape Other (See Comments)    Causes redness and will take skin when removed  . Wellbutrin [Bupropion] Other (See Comments)    Suicidal intensions    Family History  Problem Relation Age of Onset  . Hypertension Mother   . Hypertension Father   . Hypertension Sister   . Prostate cancer Brother   . Hyperlipidemia Brother   . Colon cancer Maternal Aunt   . Prostate cancer Paternal Uncle   . Colon cancer Cousin     x 2; paternal  . Thyroid disease Sister    BP 122/82   Pulse 93   Ht 5\' 1"  (1.549 m)   Wt 138 lb (62.6 kg)   SpO2 96%   BMI 26.07 kg/m   Review of Systems Denies cold  intolerance    Objective:   Physical Exam VITAL SIGNS:  See vs page. GENERAL: no distress. NECK: There is no palpable thyroid enlargement.  No thyroid nodule is palpable.  No palpable lymphadenopathy at the anterior neck.    Lab Results  Component Value Date   TSH 0.31 (L) 11/13/2016      Assessment & Plan:  Hypothyroidism, slightly overreplaced. I have sent a prescription to your pharmacy, to reduce.

## 2016-11-13 NOTE — Patient Instructions (Signed)
blood tests are requested for you today.  We'll let you know about the results.  Over time, the thyroid sometimes grows back.  If this happens, you would notice that you need less medication. Please come back for a follow-up appointment in 6 months.

## 2016-11-24 ENCOUNTER — Encounter: Payer: Self-pay | Admitting: Neurology

## 2016-11-24 ENCOUNTER — Ambulatory Visit: Payer: Medicare Other | Attending: Neurology | Admitting: Physical Therapy

## 2016-11-24 DIAGNOSIS — R42 Dizziness and giddiness: Secondary | ICD-10-CM | POA: Insufficient documentation

## 2016-11-24 DIAGNOSIS — R2689 Other abnormalities of gait and mobility: Secondary | ICD-10-CM | POA: Diagnosis not present

## 2016-11-24 NOTE — Therapy (Signed)
[facility] [address] [place], [place], [address] Phone: [phone]   Fax:  [phone]  Physical Therapy Evaluation  Patient Details  Name: Rachel Gardner MRN: [id number] Date of Birth: March 09, 1946 Referring Provider: Dr. Sarina Ill  Encounter Date: 11/24/2016      PT End of Session - 11/24/16 1328    Visit Number 1   Number of Visits 4   Date for PT Re-Evaluation 12/24/16   Authorization Type BCBS Medicare   Authorization Time Period 11-24-16 - 12-24-16   PT Start Time 0855   PT Stop Time 0930   PT Time Calculation (min) 35 min      Past Medical History:  Diagnosis Date  . Allergy    hay fever  . Ascending aortic aneurysm ([place])   . Blood in stool   . BPPV (benign paroxysmal positional vertigo)   . Diverticulitis   . Fatty liver 05/28/10   per CT at [facility]  . Genital herpes   . GERD (gastroesophageal reflux disease)   . Glaucoma   . Helicobacter pylori infection   . Hiatal hernia 05/28/10   CT @ [facility]  . Hyperlipidemia   . Hypertension   . Hypothyroidism   . 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 Kidney stones   . Lung nodule seen on imaging study   . Meniere's disease   . Obstructive sleep apnea     Past Surgical History:  Procedure Laterality Date  . ABDOMINAL HYSTERECTOMY  1995  . ANAL RECTAL MANOMETRY N/A 08/06/2016   Procedure: ANO RECTAL MANOMETRY;  Surgeon: Doran Stabler, MD;  Location: [facility];  Service: Gastroenterology;  Laterality: N/A;  . APPENDECTOMY  1989  . BPPV     x 4 after L cochlear implant  . BREAST SURGERY  1989   breast reduction  . CHOLECYSTECTOMY  1989  . COCHLEAR IMPLANT     Left/2009; Right/2012  . LAPAROSCOPIC SIGMOID COLECTOMY  2012   with takedown of splenic flexure; cystoscopy with bilateral ureteral stent placement  . TOTAL THYROIDECTOMY  1995    There were no vitals filed for this visit.       Subjective  Assessment - 11/24/16 1313    Subjective Pt reports she was in a Yoga class approx. early Sept and became dizzy when her head was positioned lower than her body.  She reports h/o BPPV and attributes the vertigo at this episode to this diagnosis.  Pt was seen by Dr. Thornell Mule who felt that etiology of her vertigo may be of central origin, and therefore, he referred her to Dr. Jaynee Eagles.  A CT of head has been ordered.  Pt reports that the vertigo has resolved as of current time but she reports moderate to severe tinnitus and noise in her ears.  She has cochlear implants and has been diagnosed with a sensorineural hearing loss.  Pt reports she is scheduled to have cataract surgery tomorrow - declines any positional testing which may provoke vertigo since she does not have anyone available to drive her in case she is unable to drive after eval  Pertinent History h/o BPPV, abdominal aortic aneurysm, Meniere's disease,   Patient Stated Goals pt states she came to PT eval as ordered by Dr. Jaynee Eagles - denies vertigo at this time   Currently in Pain? No/denies            [facility] PT Assessment - 11/24/16 [id number]      Assessment   Medical Diagnosis Vertigo   Referring Provider Dr. Sarina Ill   Onset Date/Surgical Date --  September 2017     Precautions   Precautions Other (comment)  Vertigo     Restrictions   Weight Bearing Restrictions No     Balance Screen   Has the patient fallen in the past 6 months Yes   How many times? 1   Has the patient had a decrease in activity level because of a fear of falling?  No   Is the patient reluctant to leave their home because of a fear of falling?  No     Prior Function   Level of Independence Independent     Ambulation/Gait   Ambulation/Gait Yes   Ambulation/Gait Assistance 6: Modified independent (Device/Increase time)   Assistive device None    Gait Pattern Within Functional Limits   Ambulation Surface Level;Indoor            Vestibular Assessment - 11/24/16 1324      Vestibular Assessment   General Observation pt is a 70 year old lady who reports no vertigo at this time - pt requests not to perform positional testing due to not having anyone to drive her home in case she becomes dizzy     Symptom Behavior   Type of Dizziness Diplopia   Frequency of Dizziness varies   Duration of Dizziness varies     Occulomotor Exam   Occulomotor Alignment Normal   Smooth Pursuits Intact   Saccades Intact                            PT Long Term Goals - 11/24/16 1337      PT LONG TERM GOAL #1   Title Independent in HEP for vestibular exercises.   Time 4   Period Weeks   Status New     PT LONG TERM GOAL #2   Title Pt will have (-) R and L Dix-Hallpike tests to indicate that BPPV has resolved.   Time 4   Period Weeks   Status New               Plan - 11/24/16 1329    Clinical Impression Statement Pt is a 70 year old lady with c/o vertiginous episode that started while in Yoga class in September, but pt reports vertigo has resolved as of this time.  She does have a cochlear implant and c/o moderate to severe tinnitus in ears with profound hearing loss.  She feels that her balance is impacted by the vertigo, however, denies vertigo at present time and declines positional testing which may provoke vertigo due to not having anyone to drive her home and also due to cataract surgery scheduled for tomorrow, 11-25-16.  Pt requests to return for follow up visit in 2 weeks after cataract surgery.   Rehab Potential Good   PT Frequency 1x / week   PT Duration 4 weeks   PT Treatment/Interventions ADLs/Self Care Home Management;Gait training;Stair training;Functional mobility training;Vestibular;Therapeutic activities;Therapeutic exercise;Balance training;Neuromuscular re-education;Patient/family education   PT  Next Visit Plan Perform vestibular  positional testing; give info on Meniere's disease: vestibular HEP   PT Home Exercise Plan x1 viewing   Consulted and Agree with Plan of Care Patient      Patient will benefit from skilled therapeutic intervention in order to improve the following deficits and impairments:  Dizziness, Decreased balance  Visit Diagnosis: Dizziness and giddiness - Plan: PT plan of care cert/re-cert  Other abnormalities of gait and mobility - Plan: PT plan of care cert/re-cert      G-Codes - [id number]    Functional Assessment Tool Used clinical judgment   Functional Limitation Changing and maintaining body position   Changing and Maintaining Body Position Current Status [id number]) At least 20 percent but less than 40 percent impaired, limited or restricted   Changing and Maintaining Body Position Goal Status [id number]) At least 1 percent but less than 20 percent impaired, limited or restricted       Problem List Patient Active Problem List   Diagnosis Date Noted  . Genital herpes 09/19/2016  . Angina decubitus ([place]) 03/19/2016  . Former smoker 03/19/2016  . Aortic atherosclerosis ([place]) 03/19/2016  . Essential hypertension 03/19/2016  . Dilated aortic root ([place]) 03/19/2016  . Routine general medical examination at a health care facility 09/25/2015  . Thoracic ascending aortic aneurysm ([place])   . Lung nodule seen on imaging study   . Chronic midline posterior neck pain 08/04/2014  . Headache 08/04/2014  . Hematuria, microscopic 05/31/2014  . Vitamin D deficiency 05/31/2014  . Arthralgia 05/24/2014  . Fecal incontinence 05/24/2014  . Hyperlipidemia 10/03/2013  . Acquired hypothyroidism 08/16/2013  . Glaucoma 08/16/2013  . Allergic rhinitis 08/16/2013  . HTN (hypertension) 08/16/2013    Sharise Lippy, [place], PT 11/24/2016, 1:45 PM  [facility] [address] [place], [place], [address] Phone:  [phone]   Fax:  [phone]  Name: PHILLIP MCDUFF MRN: [id number] Date of Birth: Oct 17, 1946

## 2016-11-25 DIAGNOSIS — H25812 Combined forms of age-related cataract, left eye: Secondary | ICD-10-CM | POA: Diagnosis not present

## 2016-11-25 DIAGNOSIS — H2512 Age-related nuclear cataract, left eye: Secondary | ICD-10-CM | POA: Diagnosis not present

## 2016-11-25 DIAGNOSIS — H25042 Posterior subcapsular polar age-related cataract, left eye: Secondary | ICD-10-CM | POA: Diagnosis not present

## 2016-11-27 ENCOUNTER — Telehealth: Payer: Self-pay | Admitting: Neurology

## 2016-11-27 ENCOUNTER — Encounter: Payer: Self-pay | Admitting: Internal Medicine

## 2016-11-27 NOTE — Telephone Encounter (Signed)
It looks like [facility] was trying to reach out to her on "11/11/16 per pt's son, will c/b to sched when pt is with him/ miriam" that is per GI. I will call the patient and tell her the orders have been sent over there and she can call and schedule over there at anytime.

## 2016-11-27 NOTE — Telephone Encounter (Signed)
Thank you :)

## 2016-11-27 NOTE — Telephone Encounter (Signed)
Raquel Sarna- AA,MD mean't to send this to you, thanks

## 2016-11-27 NOTE — Telephone Encounter (Signed)
Rachel Gardner, can you call patient please? She has to have a CT of the chest and wants ot have it done at the same time as the CTs I ordered. I think she is going to [facility], did we send her there as well? Can you call and discuss with her? Thank you !!!

## 2016-12-02 ENCOUNTER — Other Ambulatory Visit: Payer: Medicare Other

## 2016-12-08 ENCOUNTER — Ambulatory Visit
Admission: RE | Admit: 2016-12-08 | Discharge: 2016-12-08 | Disposition: A | Discharge status: Home / Self Care | Payer: Medicare Other | Source: Ambulatory Visit | Attending: Neurology | Admitting: Neurology

## 2016-12-08 ENCOUNTER — Ambulatory Visit: Payer: Medicare Other | Admitting: Physical Therapy

## 2016-12-08 ENCOUNTER — Ambulatory Visit
Admission: RE | Admit: 2016-12-08 | Discharge: 2016-12-08 | Disposition: A | Discharge status: Home / Self Care | Payer: Medicare Other | Source: Ambulatory Visit | Attending: Internal Medicine | Admitting: Internal Medicine

## 2016-12-08 DIAGNOSIS — R55 Syncope and collapse: Secondary | ICD-10-CM

## 2016-12-08 DIAGNOSIS — G458 Other transient cerebral ischemic attacks and related syndromes: Secondary | ICD-10-CM

## 2016-12-08 DIAGNOSIS — R2689 Other abnormalities of gait and mobility: Secondary | ICD-10-CM

## 2016-12-08 DIAGNOSIS — I712 Thoracic aortic aneurysm, without rupture, unspecified: Secondary | ICD-10-CM

## 2016-12-08 DIAGNOSIS — I6502 Occlusion and stenosis of left vertebral artery: Secondary | ICD-10-CM | POA: Diagnosis not present

## 2016-12-08 DIAGNOSIS — R42 Dizziness and giddiness: Secondary | ICD-10-CM

## 2016-12-08 DIAGNOSIS — I6523 Occlusion and stenosis of bilateral carotid arteries: Secondary | ICD-10-CM | POA: Diagnosis not present

## 2016-12-08 MED ORDER — IOPAMIDOL (ISOVUE-370) INJECTION 76%
100.0000 mL | Freq: Once | INTRAVENOUS | Status: AC | PRN
  Administered 2016-12-08: 100 mL via INTRAVENOUS

## 2016-12-09 ENCOUNTER — Encounter: Payer: Self-pay | Admitting: Surgery

## 2016-12-10 ENCOUNTER — Encounter: Payer: Self-pay | Admitting: Neurology

## 2016-12-14 ENCOUNTER — Other Ambulatory Visit: Payer: Self-pay | Admitting: Neurology

## 2016-12-14 ENCOUNTER — Telehealth: Payer: Self-pay | Admitting: Neurology

## 2016-12-14 DIAGNOSIS — R7309 Other abnormal glucose: Secondary | ICD-10-CM

## 2016-12-14 DIAGNOSIS — E782 Mixed hyperlipidemia: Secondary | ICD-10-CM

## 2016-12-14 NOTE — Telephone Encounter (Signed)
Discussed results CTA head and neck with son and patient at length. She has left-sided vertebral artery disease which can cause dizziness,vertigo and other symptoms. Unfortunately unclear if this is acute/subacute or chronic. She feels her dizziness is improving and denies dizziness/vertigo or any other symptoms with head extension which is often seen with symptomatic vertebral artery disease. However, offered to refer her to Dr. Patrecia Pour for evaluation of cerebral angiogram and possible stenting but these procedures also carry risks. The other option is to aggressively manage vascular risk factors such as cholesterol (goal ldl < 70), blood pressure, diabetes; patient is a smoker, highly recommended smokig cessation. They will discuss and get back to me. She is also scheduled for vestibular therapy in the next few weeks. Recommend daily aspirin. I will check ldl and if ldl > 70 will start a statin. Will check hgba1c as well.   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 Thornell Mule who referred her to me.

## 2016-12-16 ENCOUNTER — Other Ambulatory Visit (INDEPENDENT_AMBULATORY_CARE_PROVIDER_SITE_OTHER): Payer: Self-pay

## 2016-12-16 DIAGNOSIS — E782 Mixed hyperlipidemia: Secondary | ICD-10-CM

## 2016-12-16 DIAGNOSIS — Z0289 Encounter for other administrative examinations: Secondary | ICD-10-CM

## 2016-12-16 DIAGNOSIS — R7309 Other abnormal glucose: Secondary | ICD-10-CM

## 2016-12-17 LAB — LIPID PANEL
CHOL/HDL RATIO: 4.6 ratio — AB (ref 0.0–4.4)
Cholesterol, Total: 222 mg/dL — ABNORMAL HIGH (ref 100–199)
HDL: 48 mg/dL (ref >39)
LDL CALC: 138 mg/dL — AB (ref 0–99)
Triglycerides: 180 mg/dL — ABNORMAL HIGH (ref 0–149)
VLDL CHOLESTEROL CAL: 36 mg/dL (ref 5–40)

## 2016-12-17 LAB — HEMOGLOBIN A1C
ESTIMATED AVERAGE GLUCOSE: 123 mg/dL
HEMOGLOBIN A1C: 5.9 % — AB (ref 4.8–5.6)

## 2016-12-18 ENCOUNTER — Encounter: Payer: Self-pay | Admitting: Neurology

## 2016-12-18 ENCOUNTER — Encounter: Payer: Self-pay | Admitting: Internal Medicine

## 2016-12-18 ENCOUNTER — Telehealth: Payer: Self-pay | Admitting: *Deleted

## 2016-12-18 NOTE — Telephone Encounter (Signed)
-----   Message from Melvenia Beam, MD sent at 12/18/2016 11:36 AM EST ----- Cholesterol is elevated, total is 222, triglycerides elevated at 180 and ldl is 138. I recommend starting a low dose of lipitor if she is agreeable, please review side effects with her thanks

## 2016-12-18 NOTE — Telephone Encounter (Signed)
Called and spoke with pt son, [name] (on [place]) about lab results per AA,MD note. He verbalized understanding. He will let pt know. She is out of town until next week. She will be back on 12/24/16. He stated she cannot take any statin drugs. This is listed on her medication allergy list. Advised I will speak with AA,MD and call him back to advise what she would like her to do. He verbalized understanding.

## 2016-12-26 DIAGNOSIS — D481 Neoplasm of uncertain behavior of connective and other soft tissue: Secondary | ICD-10-CM | POA: Diagnosis not present

## 2016-12-26 DIAGNOSIS — M654 Radial styloid tenosynovitis [de Quervain]: Secondary | ICD-10-CM | POA: Diagnosis not present

## 2016-12-26 DIAGNOSIS — M65831 Other synovitis and tenosynovitis, right forearm: Secondary | ICD-10-CM | POA: Diagnosis not present

## 2016-12-26 DIAGNOSIS — R2231 Localized swelling, mass and lump, right upper limb: Secondary | ICD-10-CM | POA: Diagnosis not present

## 2017-01-05 ENCOUNTER — Ambulatory Visit: Payer: Medicare Other | Attending: Neurology | Admitting: Physical Therapy

## 2017-01-05 DIAGNOSIS — R42 Dizziness and giddiness: Secondary | ICD-10-CM | POA: Diagnosis not present

## 2017-01-06 NOTE — Therapy (Signed)
[facility] [address] [place], [place], [address] Phone: [phone]   Fax:  [phone]  Physical Therapy Treatment  Patient Details  Name: Rachel Gardner MRN: [id number] Date of Birth: July 18, 1946 Referring Provider: Dr. Sarina Ill  Encounter Date: 01/05/2017      PT End of Session - 01/06/17 2020    Visit Number 2   Number of Visits 4   Date for PT Re-Evaluation 12/24/16   Authorization Type BCBS Medicare   Authorization Time Period 11-24-16 - 12-24-16   PT Start Time 0802   PT Stop Time 0845   PT Time Calculation (min) 43 min      Past Medical History:  Diagnosis Date  . Allergy    hay fever  . Ascending aortic aneurysm ([place])   . Blood in stool   . BPPV (benign paroxysmal positional vertigo)   . Diverticulitis   . Fatty liver 05/28/10   per CT at [facility]  . Genital herpes   . GERD (gastroesophageal reflux disease)   . Glaucoma   . Helicobacter pylori infection   . Hiatal hernia 05/28/10   CT @ [facility]  . Hyperlipidemia   . Hypertension   . Hypothyroidism   . 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 Kidney stones   . Lung nodule seen on imaging study   . Meniere's disease   . Obstructive sleep apnea     Past Surgical History:  Procedure Laterality Date  . ABDOMINAL HYSTERECTOMY  1995  . ANAL RECTAL MANOMETRY N/A 08/06/2016   Procedure: ANO RECTAL MANOMETRY;  Surgeon: Doran Stabler, MD;  Location: [facility];  Service: Gastroenterology;  Laterality: N/A;  . APPENDECTOMY  1989  . BPPV     x 4 after L cochlear implant  . BREAST SURGERY  1989   breast reduction  . CHOLECYSTECTOMY  1989  . COCHLEAR IMPLANT     Left/2009; Right/2012  . LAPAROSCOPIC SIGMOID COLECTOMY  2012   with takedown of splenic flexure; cystoscopy with bilateral ureteral stent placement  . TOTAL THYROIDECTOMY  1995    There were no vitals filed for this visit.      Subjective Assessment  - 01/06/17 2017    Subjective Pt reports she has not had any dizziness; had cataract surgery on 11-25-16 and is scheduled for another cataract sx on 01-20-17; had R wrist surgery on 12-26-16 and currently has a cast;    Pertinent History h/o BPPV, abdominal aortic aneurysm, Meniere's disease,   Patient Stated Goals pt states she came to PT eval as ordered by Dr. Jaynee Eagles - denies vertigo at this time   Currently in Pain? No/denies                Vestibular Assessment - 01/06/17 0001      Positional Testing   Dix-Hallpike Dix-Hallpike Right;Dix-Hallpike Left   Sidelying Test Sidelying Right;Sidelying Left     Dix-Hallpike Right   Dix-Hallpike Right Duration none   Dix-Hallpike Right Symptoms No nystagmus     Dix-Hallpike Left   Dix-Hallpike Left Duration none   Dix-Hallpike Left Symptoms No nystagmus     Sidelying Right   Sidelying Right Duration none   Sidelying Right Symptoms No nystagmus     Sidelying Left   Sidelying Left Duration none   Sidelying Left Symptoms No nystagmus  PT Education - 01/06/17 2019    Education provided Yes   Education Details gave info on BPPV; Brandt-Daroff exercises prn   Person(s) Educated Patient   Methods Handout;Explanation   Comprehension Verbalized understanding             PT Long Term Goals - 01/06/17 2020      PT LONG TERM GOAL #1   Title Independent in HEP for vestibular exercises.   Baseline met 2017/02/01   Status Achieved     PT LONG TERM GOAL #2   Title Pt will have (-) R and L Dix-Hallpike tests to indicate that BPPV has resolved.   Baseline met Feb 01, 2017   Status Achieved               Plan - 01/06/17 2021    Clinical Impression Statement Pt has met LTG's #1 and 2: no signs or symptoms consistent with BPPV at this time; pt reports no vertigo and no problems with balance; gait is WNL's   Rehab Potential Good   PT Frequency 1x / week   PT Duration 4 weeks   PT  Treatment/Interventions ADLs/Self Care Home Management;Gait training;Stair training;Functional mobility training;Vestibular;Therapeutic activities;Therapeutic exercise;Balance training;Neuromuscular re-education;Patient/family education   PT Next Visit Plan N/A -    PT Home Exercise Plan x1 viewing   Consulted and Agree with Plan of Care Patient      Patient will benefit from skilled therapeutic intervention in order to improve the following deficits and impairments:  Dizziness, Decreased balance  Visit Diagnosis: Dizziness and giddiness       G-Codes - 02/01/2017 2025    Functional Assessment Tool Used clinical judgment   Functional Limitation Changing and maintaining body position   Changing and Maintaining Body Position Goal Status (L4562) At least 1 percent but less than 20 percent impaired, limited or restricted   Changing and Maintaining Body Position Discharge Status (B6389) 0 percent impaired, limited or restricted      Problem List Patient Active Problem List   Diagnosis Date Noted  . Genital herpes 09/19/2016  . Angina decubitus ([place]) 03/19/2016  . Former smoker 03/19/2016  . Aortic atherosclerosis ([place]) 03/19/2016  . Essential hypertension 03/19/2016  . Dilated aortic root ([place]) 03/19/2016  . Routine general medical examination at a health care facility 09/25/2015  . Thoracic ascending aortic aneurysm ([place])   . Lung nodule seen on imaging study   . Chronic midline posterior neck pain 08/04/2014  . Headache 08/04/2014  . Hematuria, microscopic 05/31/2014  . Vitamin D deficiency 05/31/2014  . Arthralgia 05/24/2014  . Fecal incontinence 05/24/2014  . Hyperlipidemia 10/03/2013  . Acquired hypothyroidism 08/16/2013  . Glaucoma 08/16/2013  . Allergic rhinitis 08/16/2013  . HTN (hypertension) 08/16/2013   PHYSICAL THERAPY DISCHARGE SUMMARY  Visits from Start of Care: 2  Current functional level related to goals / functional outcomes: See above for progress  towards goals - pt reports no vertigo at this time   Remaining deficits: None regarding vertigo   Education / Equipment: Pt has been educated in etiology of BPPV and Brandt-Daroff exercises as needed Plan: Patient agrees to discharge.  Patient goals were met. Patient is being discharged due to meeting the stated rehab goals.  ?????       Alda Lea, PT 01/06/2017, 8:28 PM  [facility] [address] [place], [place], [address] Phone: [phone]   Fax:  [phone]  Name: Rachel Gardner MRN: [id number] Date of Birth: 1946/02/10

## 2017-01-06 NOTE — Patient Instructions (Signed)
Benign Positional Vertigo Introduction Vertigo is the feeling that you or your surroundings are moving when they are not. Benign positional vertigo is the most common form of vertigo. The cause of this condition is not serious (is benign). This condition is triggered by certain movements and positions (is positional). This condition can be dangerous if it occurs while you are doing something that could endanger you or others, such as driving. What are the causes? In many cases, the cause of this condition is not known. It may be caused by a disturbance in an area of the inner ear that helps your brain to sense movement and balance. This disturbance can be caused by a viral infection (labyrinthitis), head injury, or repetitive motion. What increases the risk? This condition is more likely to develop in:  Women.  People who are 50 years of age or older. What are the signs or symptoms? Symptoms of this condition usually happen when you move your head or your eyes in different directions. Symptoms may start suddenly, and they usually last for less than a minute. Symptoms may include:  Loss of balance and falling.  Feeling like you are spinning or moving.  Feeling like your surroundings are spinning or moving.  Nausea and vomiting.  Blurred vision.  Dizziness.  Involuntary eye movement (nystagmus). Symptoms can be mild and cause only slight annoyance, or they can be severe and interfere with daily life. Episodes of benign positional vertigo may return (recur) over time, and they may be triggered by certain movements. Symptoms may improve over time. How is this diagnosed? This condition is usually diagnosed by medical history and a physical exam of the head, neck, and ears. You may be referred to a health care provider who specializes in ear, nose, and throat (ENT) problems (otolaryngologist) or a provider who specializes in disorders of the nervous system (neurologist). You may have  additional testing, including:  MRI.  A CT scan.  Eye movement tests. Your health care provider may ask you to change positions quickly while he or she watches you for symptoms of benign positional vertigo, such as nystagmus. Eye movement may be tested with an electronystagmogram (ENG), caloric stimulation, the Dix-Hallpike test, or the roll test.  An electroencephalogram (EEG). This records electrical activity in your brain.  Hearing tests. How is this treated? Usually, your health care provider will treat this by moving your head in specific positions to adjust your inner ear back to normal. Surgery may be needed in severe cases, but this is rare. In some cases, benign positional vertigo may resolve on its own in 2-4 weeks. Follow these instructions at home: Safety  Move slowly.Avoid sudden body or head movements.  Avoid driving.  Avoid operating heavy machinery.  Avoid doing any tasks that would be dangerous to you or others if a vertigo episode would occur.  If you have trouble walking or keeping your balance, try using a cane for stability. If you feel dizzy or unstable, sit down right away.  Return to your normal activities as told by your health care provider. Ask your health care provider what activities are safe for you. General instructions  Take over-the-counter and prescription medicines only as told by your health care provider.  Avoid certain positions or movements as told by your health care provider.  Drink enough fluid to keep your urine clear or pale yellow.  Keep all follow-up visits as told by your health care provider. This is important. Contact a health care provider if:    You have a fever.  Your condition gets worse or you develop new symptoms.  Your family or friends notice any behavioral changes.  Your nausea or vomiting gets worse.  You have numbness or a "pins and needles" sensation. Get help right away if:  You have difficulty speaking or  moving.  You are always dizzy.  You faint.  You develop severe headaches.  You have weakness in your legs or arms.  You have changes in your hearing or vision.  You develop a stiff neck.  You develop sensitivity to light. This information is not intended to replace advice given to you by your health care provider. Make sure you discuss any questions you have with your health care provider. Document Released: 09/22/2006 Document Revised: 05/22/2016 Document Reviewed: 04/09/2015  2017 Elsevier  

## 2017-01-07 ENCOUNTER — Encounter: Payer: Self-pay | Admitting: Internal Medicine

## 2017-01-07 ENCOUNTER — Ambulatory Visit (INDEPENDENT_AMBULATORY_CARE_PROVIDER_SITE_OTHER): Payer: Medicare Other | Admitting: Surgery

## 2017-01-07 ENCOUNTER — Ambulatory Visit (INDEPENDENT_AMBULATORY_CARE_PROVIDER_SITE_OTHER): Payer: Medicare Other | Admitting: Internal Medicine

## 2017-01-07 ENCOUNTER — Encounter: Payer: Self-pay | Admitting: Surgery

## 2017-01-07 VITALS — BP 118/83 | HR 80 | Resp 20 | Ht 61.0 in | Wt 136.0 lb

## 2017-01-07 DIAGNOSIS — R911 Solitary pulmonary nodule: Secondary | ICD-10-CM | POA: Diagnosis not present

## 2017-01-07 DIAGNOSIS — I7121 Aneurysm of the ascending aorta, without rupture: Secondary | ICD-10-CM

## 2017-01-07 DIAGNOSIS — I712 Thoracic aortic aneurysm, without rupture, unspecified: Secondary | ICD-10-CM

## 2017-01-07 DIAGNOSIS — E785 Hyperlipidemia, unspecified: Secondary | ICD-10-CM | POA: Diagnosis not present

## 2017-01-07 NOTE — Assessment & Plan Note (Signed)
LDL 138 which is in range to work on lifestyle changes. She has had some diet changes over the holidays that she is working on now. Would recommend to observe and goal LDL <130. Allergic to statins. Triglycerides not high enough to justify independent treatment.

## 2017-01-07 NOTE — Progress Notes (Signed)
Pre visit review using our clinic review tool, if applicable. No additional management support is needed unless otherwise documented below in the visit note. 

## 2017-01-07 NOTE — Progress Notes (Signed)
   Subjective:    Patient ID: Rachel Gardner, female    DOB: 1946-12-18, 71 y.o.   MRN: [id number]  HPI The patient is a 71 YO female coming in for follow up of lipids at the advice of her neurologist. She had been on zetia a while ago but is now just taking omega 3 FA. Her recent lipid panel is reviewed with her at visit. Would recommend LDL goal <130 for her with her other risk factors. She is allergic to statins and cannot take them at all. She has tried several in the past. She denies chest pains, SOB, neurological symptoms. She did start taking aspirin 81 mg daily at the request of neurologist.   Review of Systems  Constitutional: Negative.   Respiratory: Negative.   Cardiovascular: Negative.   Gastrointestinal: Negative.   Musculoskeletal: Negative.   Skin: Negative.       Objective:   Physical Exam  Constitutional: She is oriented to person, place, and time. She appears well-developed and well-nourished.  HENT:  Head: Normocephalic and atraumatic.  Hard of hearing with aids  Eyes: EOM are normal.  Neck: Normal range of motion.  Cardiovascular: Normal rate and regular rhythm.   Pulmonary/Chest: Effort normal and breath sounds normal.  Abdominal: Soft.  Neurological: She is alert and oriented to person, place, and time.  Skin: Skin is warm and dry.   Vitals:   01/07/17 0830  BP: 130/70  Pulse: 87  Resp: 14  Temp: 98.1 F (36.7 C)  TempSrc: Oral  SpO2: 98%  Weight: 137 lb 8 oz (62.4 kg)  Height: 5\' 1"  (1.549 m)      Assessment & Plan:

## 2017-01-07 NOTE — Patient Instructions (Signed)
We do not need to do anything different.

## 2017-01-07 NOTE — Progress Notes (Signed)
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 She had a CT of the neck and thoracic spine in 11/2014 after falling off a stool and injuring her neck that showed an ascending aortic aneurysm. CT of the chest on 01/03/2015 showed a 7 mm subpleural nodule in the LLL of the lung and a 4 cm fusiform ascending aortic aneurysm. I saw her on 01/06/2015 for the first time and reviewed the CT and MRA scan reports from her previous scans which she has with her. The aorta was 3.8 cm in 2006 by CT, 3.4 cm in 2008 by MRA  and 3.4 cm in 2012 by CT. It was essentially unchanged since 2006. The left lower lobe nodule had also been present since 2006 when it measured 76mm. It was minimally enlarged after 9 years and  smoothly marginated and almost certainly benign. When I saw her last January 2017 her CT showed that the aneursym had slightly enlarged to 4.4 cm and the lung nodule was 7 mm. She also had some symptoms of exertional shortness of breath and chest aching with fatigue and was referred to Dr. Marlou Porch for evaluation. She had a nuclear stress test that was a low risk normal study. She says that she has been feeling fairly well over the past year. She stopped smoking last January for a while but restarted during a stressful time. She was recently worked up for some dizziness and nausea that developed while doing yoga. A CTA of the neck showed an occluded non-dominant left vertebral artery which is probably of no consequence. CT of the brain was negative.  Current Outpatient Prescriptions  Medication Sig Dispense Refill  . ascorbic acid (VITAMIN C) 1000 MG tablet Take 500 mg by mouth daily.     Marland Kitchen aspirin 81 MG chewable tablet Chew by mouth daily.    Marland Kitchen b complex vitamins tablet Take 1 tablet by mouth daily.    . bimatoprost (LUMIGAN) 0.01 % SOLN Place 1 drop into  both eyes at bedtime.    . Calcium Carb-Cholecalciferol (CALCIUM 500 + D3) 500-600 MG-UNIT TABS Take by mouth.    . lactobacillus acidophilus (BACID) TABS tablet Take 1 tablet by mouth daily. 20 billions units per day    . levothyroxine (SYNTHROID, LEVOTHROID) 125 MCG tablet Take 1 tablet (125 mcg total) by mouth daily before breakfast. 90 tablet 3  . losartan-hydrochlorothiazide (HYZAAR) 100-25 MG per tablet Take 1 tablet by mouth daily.     . nicotine polacrilex (COMMIT) 2 MG lozenge Take 2 mg by mouth as needed for smoking cessation.    . Omega-3 Fatty Acids (FISH OIL PO) Take 1 tablet by mouth daily.     No current facility-administered medications for this visit.      Physical Exam: BP 118/83   Pulse 80   Resp 20   Ht 5\' 1"  (1.549 m)   Wt 136 lb (61.7 kg)   SpO2 95%   BMI 25.70 kg/m   She looks well Cardiac exam shows a regular rate and rhythm with no murmur Lungs are clear   Diagnostic Tests:  CT ANGIO CHEST AORTA W &/OR WO CONTRAST (Accession [id number]) (Order [id number])  Imaging  Date: 12/08/2016 Department: [facility] [place] Released By: Sharlet Salina Authorizing: Hoyt Koch, MD  Exam Information   Status Exam Begun  Exam Ended   Final [99] 12/08/2016 11:31 AM 12/08/2016 12:29 PM  PACS Images   Show images for CT ANGIO CHEST AORTA W &/OR WO CONTRAST  Study Result   CLINICAL DATA:  Thoracic aortic aneurysm  EXAM: CT ANGIOGRAPHY CHEST WITH CONTRAST  TECHNIQUE: Multidetector CT imaging of the chest was performed using the standard protocol during bolus administration of intravenous contrast. Multiplanar CT image reconstructions and MIPs were obtained to evaluate the vascular anatomy.  CONTRAST:  100 cc Isovue 370 IV  COMPARISON:  Chest CT 10/05/2015, 12/11/2014  FINDINGS: Cardiovascular: Slight aneurysmal dilatation of the ascending thoracic aorta, 4 cm maximally. Distal aortic arch 2.8 cm. Descending  thoracic aorta normal caliber. Scattered atherosclerotic calcifications in the aortic arch and descending thoracic aorta. Calcifications in the left anterior descending coronary artery.  Mediastinum/Nodes: No mediastinal, hilar, or axillary adenopathy.  Lungs/Pleura: Biapical scarring. 8 mm nodule in the left lower lobe posteriorly on image 64 compared with 7 mm nodule previously, not significantly changed. Linear scarring in the lung bases. No pleural effusions.  Upper Abdomen: Imaging into the upper abdomen shows no acute findings.  Musculoskeletal: Chest wall soft tissues are unremarkable. No acute bony abnormality or focal bone lesion.  Review of the MIP images confirms the above findings.  IMPRESSION: Slight aneurysmal dilatation of the ascending thoracic aorta, 4 cm maximally. Recommend annual imaging followup by CTA or MRA. This recommendation follows 2010 ACCF/AHA/AATS/ACR/ASA/SCA/SCAI/SIR/STS/SVM Guidelines for the Diagnosis and Management of Patients with Thoracic Aortic Disease. Circulation. 2010; 121: e266-e369  8 mm posterior left lower lobe nodule, not significantly changed since prior study. This is stable dating back to December 2015 and is most compatible with a benign nodule.  Coronary artery calcifications in the left anterior descending coronary artery. Aortic atherosclerosis.   Electronically Signed   By: Rolm Baptise M.D.   On: 12/08/2016 13:33      Impression:  She has a stable 4.0 cm fusiform ascending aortic aneurysm. Her CT from 10/05/2015 was measured at 4.4 cm but that was without contrast and I think the current measurement is more accurate. This does not require any treatment at this time other than good blood pressure control. 5.5 cm is the usual threshold for recommending surgical therapy. 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 It has been present for many years and  does not require close follow up. She is a 71 year old with a long history of smoking who still smokes and she should have a yearly low dose screening chest CT which will also be adequate for following the ascending aneurysm even without contrast. I reviewed her CTA studies with her and answered her questions.  Plan:  I would like to see her back in one year with a low dose screening chest CT.   Gaye Pollack, MD [facility] [phone]

## 2017-01-20 DIAGNOSIS — H25811 Combined forms of age-related cataract, right eye: Secondary | ICD-10-CM | POA: Diagnosis not present

## 2017-01-20 DIAGNOSIS — H2511 Age-related nuclear cataract, right eye: Secondary | ICD-10-CM | POA: Diagnosis not present

## 2017-02-02 DIAGNOSIS — H903 Sensorineural hearing loss, bilateral: Secondary | ICD-10-CM | POA: Diagnosis not present

## 2017-02-09 ENCOUNTER — Ambulatory Visit (INDEPENDENT_AMBULATORY_CARE_PROVIDER_SITE_OTHER)
Admission: RE | Admit: 2017-02-09 | Discharge: 2017-02-09 | Disposition: A | Discharge status: Home / Self Care | Payer: Medicare Other | Source: Ambulatory Visit | Attending: Internal Medicine | Admitting: Internal Medicine

## 2017-02-09 ENCOUNTER — Ambulatory Visit (INDEPENDENT_AMBULATORY_CARE_PROVIDER_SITE_OTHER): Payer: Medicare Other | Admitting: Internal Medicine

## 2017-02-09 VITALS — BP 110/66 | HR 88 | Temp 98.3°F | Wt 135.0 lb

## 2017-02-09 DIAGNOSIS — R1031 Right lower quadrant pain: Secondary | ICD-10-CM | POA: Diagnosis not present

## 2017-02-09 DIAGNOSIS — M25551 Pain in right hip: Secondary | ICD-10-CM | POA: Diagnosis not present

## 2017-02-09 MED ORDER — DICLOFENAC SODIUM 75 MG PO TBEC: 75.0000 mg | DELAYED_RELEASE_TABLET | Freq: Two times a day (BID) | ORAL | 0 refills | Status: DC

## 2017-02-09 NOTE — Progress Notes (Signed)
   Subjective:    Patient ID: Rachel Gardner, female    DOB: 17-Jun-1946, 71 y.o.   MRN: [id number]  HPI The patient is a 71 YO female coming in for back pain going on for about 1-2 weeks. She does not recall anything specific that started the pain. She denies fevers or chills. No weight change. No numbness or weakness in her legs. She does have some radiation of the pain into the right groin and upper leg. She denies fall or trauma. She has had problems with her lower back before.   Review of Systems  Constitutional: Negative.   Respiratory: Negative.   Cardiovascular: Negative.   Gastrointestinal: Negative.   Musculoskeletal: Positive for arthralgias and back pain. Negative for gait problem, joint swelling, myalgias and neck pain.  Neurological: Negative.       Objective:   Physical Exam  Constitutional: She is oriented to person, place, and time. She appears well-developed and well-nourished.  HENT:  Head: Normocephalic and atraumatic.  Eyes: EOM are normal.  Neck: Normal range of motion.  Cardiovascular: Normal rate and regular rhythm.   Pulmonary/Chest: Effort normal.  Abdominal: Soft.  Musculoskeletal: She exhibits tenderness.  Pain over the right SI joint and radiation into the upper right leg. No numbness or weakness in the legs.   Neurological: She is alert and oriented to person, place, and time.  Skin: Skin is warm and dry.   Vitals:   02/09/17 0809  BP: 110/66  Pulse: 88  Temp: 98.3 F (36.8 C)  SpO2: 100%  Weight: 135 lb (61.2 kg)      Assessment & Plan:

## 2017-02-09 NOTE — Patient Instructions (Signed)
We will check the x-ray today and call you back with the results.   We have sent in the diclofenac that you can take 1 pill twice a day for the anti-inflammatory.

## 2017-02-10 DIAGNOSIS — R1031 Right lower quadrant pain: Secondary | ICD-10-CM | POA: Insufficient documentation

## 2017-02-10 NOTE — Assessment & Plan Note (Signed)
Checking x-ray for any changes in the hip or pelvis. She does have some known lumbar arthritis which could be contributing. Rx for voltaren pills to help with pain.

## 2017-02-13 ENCOUNTER — Other Ambulatory Visit: Payer: Self-pay | Admitting: Endocrinology

## 2017-02-27 DIAGNOSIS — Z961 Presence of intraocular lens: Secondary | ICD-10-CM | POA: Diagnosis not present

## 2017-03-06 LAB — BASIC METABOLIC PANEL
CREATININE: 1 mg/dL (ref 0.5–1.1)
Glucose: 95 mg/dL
Potassium: 4.3 mmol/L (ref 3.4–5.3)
Sodium: 141 mmol/L (ref 137–147)

## 2017-03-06 LAB — CBC AND DIFFERENTIAL
HEMATOCRIT: 39 % (ref 36–46)
HEMOGLOBIN: 13 g/dL (ref 12.0–16.0)
NEUTROS ABS: 61 /uL
PLATELETS: 241 10*3/uL (ref 150–399)
WBC: 9.7 10*3/mL

## 2017-03-06 LAB — HEPATIC FUNCTION PANEL
ALT: 27 U/L (ref 7–35)
AST: 21 U/L (ref 13–35)

## 2017-03-06 LAB — LIPID PANEL
CHOLESTEROL: 182 mg/dL (ref 0–200)
HDL: 38 mg/dL (ref 35–70)
LDL Cholesterol: 109 mg/dL
TRIGLYCERIDES: 177 mg/dL — AB (ref 40–160)

## 2017-03-06 LAB — VITAMIN D 25 HYDROXY (VIT D DEFICIENCY, FRACTURES): Vit D, 25-Hydroxy: 31.1

## 2017-04-08 ENCOUNTER — Encounter: Payer: Self-pay | Admitting: Internal Medicine

## 2017-04-08 NOTE — Progress Notes (Unsigned)
Results entered and sent to scan  

## 2017-04-17 DIAGNOSIS — M654 Radial styloid tenosynovitis [de Quervain]: Secondary | ICD-10-CM | POA: Diagnosis not present

## 2017-04-20 DIAGNOSIS — H903 Sensorineural hearing loss, bilateral: Secondary | ICD-10-CM | POA: Diagnosis not present

## 2017-04-22 DIAGNOSIS — H903 Sensorineural hearing loss, bilateral: Secondary | ICD-10-CM | POA: Diagnosis not present

## 2017-04-22 DIAGNOSIS — H9313 Tinnitus, bilateral: Secondary | ICD-10-CM | POA: Diagnosis not present

## 2017-05-05 ENCOUNTER — Ambulatory Visit (INDEPENDENT_AMBULATORY_CARE_PROVIDER_SITE_OTHER): Payer: Medicare Other | Admitting: Cardiology

## 2017-05-05 ENCOUNTER — Encounter: Payer: Self-pay | Admitting: Cardiology

## 2017-05-05 VITALS — BP 110/70 | HR 85 | Ht 61.0 in | Wt 135.4 lb

## 2017-05-05 DIAGNOSIS — I77810 Thoracic aortic ectasia: Secondary | ICD-10-CM | POA: Diagnosis not present

## 2017-05-05 DIAGNOSIS — Z87891 Personal history of nicotine dependence: Secondary | ICD-10-CM

## 2017-05-05 DIAGNOSIS — I2510 Atherosclerotic heart disease of native coronary artery without angina pectoris: Secondary | ICD-10-CM

## 2017-05-05 DIAGNOSIS — I10 Essential (primary) hypertension: Secondary | ICD-10-CM

## 2017-05-05 DIAGNOSIS — I2584 Coronary atherosclerosis due to calcified coronary lesion: Secondary | ICD-10-CM

## 2017-05-05 DIAGNOSIS — I700 Atherosclerosis of aorta: Secondary | ICD-10-CM | POA: Diagnosis not present

## 2017-05-05 NOTE — Progress Notes (Signed)
Cardiology Office Note    Date:  05/05/2017   ID:  Raea, Magallon 30-Apr-1946, MRN [id number]  PCP:  Hoyt Koch, MD  Cardiologist:   Candee Furbish, MD     History of Present Illness:  Rachel Gardner is a 71 y.o. female here for follow-up of coronary calcification, dilated aortic root, aortic atherosclerosis here for follow-up    She has seen Dr. Cyndia Bent, aortic root is currently 4 cm on most recent CT scan, was previously described as 4.4 however this may been a generous measurement.  Left lower lobe lung nodule that is stable at 7 mm. Extensive coronary calcification was seen on CT scan. Prior she had exertional tiredness and neck tightness but this has resolved. Nuclear stress test was reassuring in 2017. Had prior neck injury. BP was 160 at the time. Even her necklace was bothering her.   2013 had BPPV in [place]. Has a cochlear implant. Has had extensive neurologic workup as well as all reassuring.    On 04/19/12 - ECHO normal EF.  ETT 7:30 min non specific STT changes Nuclear stress 2017  Statin intol - Lipitor, crestor, pravastatin, zocor,  - muscle pain. LDL 115. Stopped taking Zetia, trying to eat- celery, juice.   Quit smoking 12/2015. Retired Marine scientist. She enjoyed traveling from [place] to [name], [place], [place] for her second birthday.    Past Medical History:  Diagnosis Date  . Allergy    hay fever  . Ascending aortic aneurysm ([place])   . Blood in stool   . BPPV (benign paroxysmal positional vertigo)   . Diverticulitis   . Fatty liver 05/28/10   per CT [facility]  . Genital herpes   . GERD (gastroesophageal reflux disease)   . Glaucoma   . Helicobacter pylori infection   . Hiatal hernia 05/28/10   CT @ [facility]  . Hyperlipidemia   . Hypertension   . Hypothyroidism   . 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 Kidney stones   . Lung nodule seen on imaging study   . Meniere's disease   . Obstructive sleep apnea     Past Surgical  History:  Procedure Laterality Date  . ABDOMINAL HYSTERECTOMY  1995  . ANAL RECTAL MANOMETRY N/A 08/06/2016   Procedure: ANO RECTAL MANOMETRY;  Surgeon: Doran Stabler, MD;  Location: [facility];  Service: Gastroenterology;  Laterality: N/A;  . APPENDECTOMY  1989  . BPPV     x 4 after L cochlear implant  . BREAST SURGERY  1989   breast reduction  . CHOLECYSTECTOMY  1989  . COCHLEAR IMPLANT     Left/2009; Right/2012  . LAPAROSCOPIC SIGMOID COLECTOMY  2012   with takedown of splenic flexure; cystoscopy with bilateral ureteral stent placement  . TOTAL THYROIDECTOMY  1995    Outpatient Medications Prior to Visit  Medication Sig Dispense Refill  . ascorbic acid (VITAMIN C) 1000 MG tablet Take 500 mg by mouth daily.     Marland Kitchen aspirin 81 MG chewable tablet Chew 81 mg by mouth daily.     Marland Kitchen b complex vitamins tablet Take 1 tablet by mouth daily.    . bimatoprost (LUMIGAN) 0.01 % SOLN Place 1 drop into both eyes at bedtime.    . Calcium Carb-Cholecalciferol (CALCIUM 500 + D3) 500-600 MG-UNIT TABS Take 1 Dose by mouth daily.     . diclofenac (VOLTAREN) 75 MG EC tablet Take 1 tablet (75 mg total) by mouth 2 (  two) times daily. 60 tablet 0  . lactobacillus acidophilus (BACID) TABS tablet Take 1 tablet by mouth daily. 20 billions units per day    . levothyroxine (SYNTHROID, LEVOTHROID) 125 MCG tablet Take 1 tablet (125 mcg total) by mouth daily before breakfast. 90 tablet 3  . losartan-hydrochlorothiazide (HYZAAR) 100-25 MG per tablet Take 1 tablet by mouth daily.     . nicotine polacrilex (COMMIT) 2 MG lozenge Take 2 mg by mouth as needed for smoking cessation.    . Omega-3 Fatty Acids (FISH OIL PO) Take 1 tablet by mouth daily.    Marland Kitchen levothyroxine (SYNTHROID, LEVOTHROID) 150 MCG tablet TAKE 1 TABLET BY MOUTH DAILY 90 tablet 0  . predniSONE 5 MG/ML concentrated solution Take 5 mg by mouth daily with breakfast.      No facility-administered medications prior to visit.      Allergies:   Morphine  and related; Statins; Tape; and Wellbutrin [bupropion]   Social History   Social History  . Marital status: Divorced    Spouse name: N/A  . Number of children: 3  . Years of education: N/A   Occupational History  . retired Marine scientist    Social History Main Topics  . Smoking status: Current Every Day Smoker    Packs/day: 0.50    Years: 41.00    Types: Cigarettes    Last attempt to quit: 01/18/2016  . Smokeless tobacco: Never Used  . Alcohol use 0.0 oz/week     Comment: rarely  . Drug use: No  . Sexual activity: No   Other Topics Concern  . None   Social History Narrative   Regular exercise: walk; 1 mile a day / walk 5 miles weekend;    Caffeine use: 1 cup of coffee in the AM      3 children           Family History:  The patient's family history includes Colon cancer in her cousin and maternal aunt; Hyperlipidemia in her brother; Hypertension in her father, mother, and sister; Prostate cancer in her brother and paternal uncle; Thyroid disease in her sister.   ROS:   Please see the history of present illness.    ROS Unless specified above all other review of systems negative.   PHYSICAL EXAM:   VS:  BP 110/70   Pulse 85   Ht 5\' 1"  (1.549 m)   Wt 135 lb 6.4 oz (61.4 kg)   BMI 25.58 kg/m    GEN: Well nourished, well developed, in no acute distress  HEENT: normal  Neck: no JVD, carotid bruits, or masses Cardiac: RRR; no murmurs, rubs, or gallops,no edema  Respiratory:  clear to auscultation bilaterally, normal work of breathing GI: soft, nontender, nondistended, + BS MS: no deformity or atrophy  Skin: warm and dry, no rash Neuro:  Alert and Oriented x 3, Strength and sensation are intact Psych: euthymic mood, full affect   Wt Readings from Last 3 Encounters:  05/05/17 135 lb 6.4 oz (61.4 kg)  02/09/17 135 lb (61.2 kg)  01/07/17 136 lb (61.7 kg)      Studies/Labs Reviewed:   EKG:  EKG is ordered today.  05/05/17 shows sinus rhythm 85 with right atrial  enlargement, borderline QT prolongation approximately 420 ms. Personally viewed. 03/19/16-sinus rhythm, 74, mild sinus arrhythmia otherwise unremarkable, personally viewed.  Recent Labs: 11/06/2016: BUN 19 11/13/2016: TSH 0.31 03/06/2017: ALT 27; Creatinine 1.0; Hemoglobin 13.0; Platelets 241; Potassium 4.3; Sodium 141   Lipid Panel  Component Value Date/Time   CHOL 182 03/06/2017   CHOL 222 (H) 12/16/2016 0826   TRIG 177 (A) 03/06/2017   HDL 38 03/06/2017   HDL 48 12/16/2016 0826   CHOLHDL 4.6 (H) 12/16/2016 0826   CHOLHDL 5 11/20/2014 0733   VLDL 32.2 11/20/2014 0733   LDLCALC 109 03/06/2017   LDLCALC 138 (H) 12/16/2016 0826   LDLDIRECT 172.0 12/06/2013 0737    Additional studies/ records that were reviewed today include:  ECHO, ETT from 2013 reviewed. Normal.   Nuc 2017  Nuclear stress EF: 63%.  There was no ST segment deviation noted during stress.  The study is normal.  This is a low risk study.  The left ventricular ejection fraction is normal (55-65%).   ASSESSMENT:    1. Aortic atherosclerosis ([place])   2. Essential hypertension   3. Former smoker   4. Coronary artery calcification   5. Dilated aortic root ([facility])      PLAN:  In order of problems listed above:  Coronary artery calcification  - LAD distribution. Reassuring stress test on 03/25/16 with no ischemia.  Former smoker -Congratulated her on quitting 12/2015.  Aortic atherosclerosis -She is not interested in using Zetia or statin. She is trying to eat more fruits and vegetables. LDL has improved.  Hyperlipidemia -Mild previously, LDL 110. Prior statin intolerances. Pravastatin, Crestor, Lipitor previously tried. Muscle pain. -Not interested in trying medication.  Ascending aortic dilation -4.4 cm previously measured oh (possibly generous) however repeat CT scan now shows 4.0 cm ascending aortic aneurysm. Stable., followed by Dr. Cyndia Bent.  Essential hypertension -No changes made, medications  reviewed.     Medication Adjustments/Labs and Tests Ordered: Current medicines are reviewed at length with the patient today.  Concerns regarding medicines are outlined above.  Medication changes, Labs and Tests ordered today are listed in the Patient Instructions below. Patient Instructions  Medication Instructions:  The current medical regimen is effective;  continue present plan and medications.  Follow-Up: Follow up in 1 year with Dr. Marlou Porch.  You will receive a letter in the mail 2 months before you are due.  Please call us when you receive this letter to schedule your follow up appointment.  If you need a refill on your cardiac medications before your next appointment, please call your pharmacy.  Thank you for choosing [facility]!!          Signed, Candee Furbish, MD  05/05/2017 4:53 PM    [facility] [place] [address], [place], [place]  [address] Phone: [phone]; Fax: [phone]

## 2017-05-05 NOTE — Patient Instructions (Signed)

## 2017-05-06 ENCOUNTER — Ambulatory Visit: Payer: Medicare Other | Admitting: Cardiology

## 2017-05-13 ENCOUNTER — Encounter: Payer: Self-pay | Admitting: Endocrinology

## 2017-05-13 ENCOUNTER — Ambulatory Visit (INDEPENDENT_AMBULATORY_CARE_PROVIDER_SITE_OTHER): Payer: Medicare Other | Admitting: Endocrinology

## 2017-05-13 VITALS — BP 128/88 | HR 88 | Ht 61.0 in | Wt 137.0 lb

## 2017-05-13 DIAGNOSIS — E039 Hypothyroidism, unspecified: Secondary | ICD-10-CM

## 2017-05-13 NOTE — Patient Instructions (Addendum)
Please continue the same medication.   Dr Sharlet Salina would be happy to check your thyroid. I would be happy to see you back here as needed.

## 2017-05-13 NOTE — Progress Notes (Signed)
Subjective:    Patient ID: Rachel Gardner, female    DOB: [birth year], 71 y.o.   MRN: [id number]  HPI Pt returns for f/u of postsurgical hypothyroidism (she had thyroidectomy in 2005, for a goiter--was benign on pathology.  she has been on prescribed thyroid hormone therapy since then; synthroid dosage has varied from 125 to 200 mcg/d).  pt states she feels well in general.  She takes synthroid as rx'ed.   Past Medical History:  Diagnosis Date  . Allergy    hay fever  . Ascending aortic aneurysm ([place])   . Blood in stool   . BPPV (benign paroxysmal positional vertigo)   . Diverticulitis   . Fatty liver 05/28/10   per CT at [facility]  . Genital herpes   . GERD (gastroesophageal reflux disease)   . Glaucoma   . Helicobacter pylori infection   . Hiatal hernia 05/28/10   CT @ [facility]  . Hyperlipidemia   . Hypertension   . Hypothyroidism   . 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 Kidney stones   . Lung nodule seen on imaging study   . Meniere's disease   . Obstructive sleep apnea     Past Surgical History:  Procedure Laterality Date  . ABDOMINAL HYSTERECTOMY  1995  . ANAL RECTAL MANOMETRY N/A 08/06/2016   Procedure: ANO RECTAL MANOMETRY;  Surgeon: Doran Stabler, MD;  Location: [facility];  Service: Gastroenterology;  Laterality: N/A;  . APPENDECTOMY  1989  . BPPV     x 4 after L cochlear implant  . BREAST SURGERY  1989   breast reduction  . CHOLECYSTECTOMY  1989  . COCHLEAR IMPLANT     Left/2009; Right/2012  . LAPAROSCOPIC SIGMOID COLECTOMY  2012   with takedown of splenic flexure; cystoscopy with bilateral ureteral stent placement  . TOTAL THYROIDECTOMY  1995    Social History   Social History  . Marital status: Divorced    Spouse name: N/A  . Number of children: 3  . Years of education: N/A   Occupational History  . retired Marine scientist    Social History Main Topics  . Smoking status: Current Every Day Smoker    Packs/day: 0.50    Years:  41.00    Types: Cigarettes    Last attempt to quit: 01/18/2016  . Smokeless tobacco: Never Used  . Alcohol use 0.0 oz/week     Comment: rarely  . Drug use: No  . Sexual activity: No   Other Topics Concern  . Not on file   Social History Narrative   Regular exercise: walk; 1 mile a day / walk 5 miles weekend;    Caffeine use: 1 cup of coffee in the AM      3 children          Current Outpatient Prescriptions on File Prior to Visit  Medication Sig Dispense Refill  . ascorbic acid (VITAMIN C) 1000 MG tablet Take 500 mg by mouth daily.     Marland Kitchen aspirin 81 MG chewable tablet Chew 81 mg by mouth daily.     Marland Kitchen b complex vitamins tablet Take 1 tablet by mouth daily.    . bimatoprost (LUMIGAN) 0.01 % SOLN Place 1 drop into both eyes at bedtime.    . Calcium Carb-Cholecalciferol (CALCIUM 500 + D3) 500-600 MG-UNIT TABS Take 1 Dose by mouth daily.     . diclofenac (VOLTAREN) 75 MG EC tablet Take 1 tablet (75 mg total)  by mouth 2 (two) times daily. 60 tablet 0  . lactobacillus acidophilus (BACID) TABS tablet Take 1 tablet by mouth daily. 20 billions units per day    . levothyroxine (SYNTHROID, LEVOTHROID) 125 MCG tablet Take 1 tablet (125 mcg total) by mouth daily before breakfast. 90 tablet 3  . losartan-hydrochlorothiazide (HYZAAR) 100-25 MG per tablet Take 1 tablet by mouth daily.     . nicotine polacrilex (COMMIT) 2 MG lozenge Take 2 mg by mouth as needed for smoking cessation.    . Omega-3 Fatty Acids (FISH OIL PO) Take 1 tablet by mouth daily.     No current facility-administered medications on file prior to visit.     Allergies  Allergen Reactions  . Morphine And Related     Nausea Can tolerate Dilaudid  . Statins Other (See Comments)    Joint pain   . Tape Other (See Comments)    Causes redness and will take skin when removed  . Wellbutrin [Bupropion] Other (See Comments)    Suicidal intensions    Family History  Problem Relation Age of Onset  . Hypertension Mother   .  Hypertension Father   . Hypertension Sister   . Prostate cancer Brother   . Hyperlipidemia Brother   . Colon cancer Maternal Aunt   . Prostate cancer Paternal Uncle   . Colon cancer Cousin        x 2; paternal  . Thyroid disease Sister     BP 128/88   Pulse 88   Ht 5\' 1"  (1.549 m)   Wt 137 lb (62.1 kg)   SpO2 95%   BMI 25.89 kg/m    Review of Systems Denies dry skin.      Objective:   Physical Exam VITAL SIGNS:  See vs page.  GENERAL: no distress.  NECK: a healed scar is present.  i do not appreciate a nodule in the thyroid or elsewhere in the neck.    TSH=normal    Assessment & Plan:  postsurgical hypothyroidism, well-replaced. Please continue the same medication HTN: well-controlled.  Please continue the same medication

## 2017-06-04 ENCOUNTER — Ambulatory Visit (INDEPENDENT_AMBULATORY_CARE_PROVIDER_SITE_OTHER): Payer: Medicare Other | Admitting: Family

## 2017-06-04 ENCOUNTER — Encounter: Payer: Self-pay | Admitting: Family

## 2017-06-04 DIAGNOSIS — K219 Gastro-esophageal reflux disease without esophagitis: Secondary | ICD-10-CM | POA: Insufficient documentation

## 2017-06-04 DIAGNOSIS — K2100 Gastro-esophageal reflux disease with esophagitis, without bleeding: Secondary | ICD-10-CM

## 2017-06-04 MED ORDER — OMEPRAZOLE 40 MG PO CPDR: 40.0000 mg | DELAYED_RELEASE_CAPSULE | Freq: Every day | ORAL | 0 refills | Status: DC

## 2017-06-04 NOTE — Assessment & Plan Note (Signed)
Symptoms and exam consistent with exacerbation of gastroesophageal reflux with esophagitis. No significant findings on physical exam. Start omeprazole. Information on GERD related food intake provided. Follow-up if symptoms worsen or do not improve.

## 2017-06-04 NOTE — Patient Instructions (Addendum)
Thank you for choosing [facility].  SUMMARY AND INSTRUCTIONS:  Medication:  Your prescription(s) have been submitted to your pharmacy or been printed and provided for you. Please take as directed and contact our office if you believe you are having problem(s) with the medication(s) or have any questions.  Follow up:  If your symptoms worsen or fail to improve, please contact our office for further instruction, or in case of emergency go directly to the emergency room at the closest medical facility.     Food Choices for Gastroesophageal Reflux Disease, Adult When you have gastroesophageal reflux disease (GERD), the foods you eat and your eating habits are very important. Choosing the right foods can help ease your discomfort. What guidelines do I need to follow?  Choose fruits, vegetables, whole grains, and low-fat dairy products.  Choose low-fat meat, fish, and poultry.  Limit fats such as oils, salad dressings, butter, nuts, and avocado.  Keep a food diary. This helps you identify foods that cause symptoms.  Avoid foods that cause symptoms. These may be different for everyone.  Eat small meals often instead of 3 large meals a day.  Eat your meals slowly, in a place where you are relaxed.  Limit fried foods.  Cook foods using methods other than frying.  Avoid drinking alcohol.  Avoid drinking large amounts of liquids with your meals.  Avoid bending over or lying down until 2-3 hours after eating. What foods are not recommended? These are some foods and drinks that may make your symptoms worse: Vegetables Tomatoes. Tomato juice. Tomato and spaghetti sauce. Chili peppers. Onion and garlic. Horseradish. Fruits Oranges, grapefruit, and lemon (fruit and juice). Meats High-fat meats, fish, and poultry. This includes hot dogs, ribs, ham, sausage, salami, and bacon. Dairy Whole milk and chocolate milk. Sour cream. Cream. Butter. Ice cream. Cream  cheese. Drinks Coffee and tea. Bubbly (carbonated) drinks or energy drinks. Condiments Hot sauce. Barbecue sauce. Sweets/Desserts Chocolate and cocoa. Donuts. Peppermint and spearmint. Fats and Oils High-fat foods. This includes [place] fries and potato chips. Other Vinegar. Strong spices. This includes black pepper, white pepper, red pepper, cayenne, curry powder, cloves, ginger, and chili powder. The items listed above may not be a complete list of foods and drinks to avoid. Contact your dietitian for more information. This information is not intended to replace advice given to you by your health care provider. Make sure you discuss any questions you have with your health care provider. Document Released: 06/15/2012 Document Revised: 05/22/2016 Document Reviewed: 10/19/2013 Elsevier Interactive Patient Education  2017 [employer].

## 2017-06-04 NOTE — Progress Notes (Signed)
Subjective:    Patient ID: Rachel Gardner, female    DOB: [birth year], 71 y.o.   MRN: [id number]  Chief Complaint  Patient presents with  . Shortness of Breath    acid reflux and SOB, some productive cough, x3 days    HPI:  Rachel Gardner is a 71 y.o. female who  has a past medical history of Allergy; Ascending aortic aneurysm ([place]); Blood in stool; BPPV (benign paroxysmal positional vertigo); Diverticulitis; Fatty liver (05/28/10); Genital herpes; GERD (gastroesophageal reflux disease); Glaucoma; Helicobacter pylori infection; Hiatal hernia (05/28/10); Hyperlipidemia; Hypertension; Hypothyroidism; Incontinence of urine in female; Kidney stones; Lung nodule seen on imaging study; Meniere's disease; and Obstructive sleep apnea. and presents today for an acute office.    Associated symptom of acid reflux that burned following a meal on Monday and has remained irritated and developed shortness of breath and cough. Aggravated with lying down and timing of the symptoms are worse at night. Modifying factors include Zyrtec as she thought it may be allergies. No abdominal, nausea, vomiting, or diarrhea. Does have constipation secondary to bowel rescection.   Allergies  Allergen Reactions  . Morphine And Related     Nausea Can tolerate Dilaudid  . Statins Other (See Comments)    Joint pain   . Tape Other (See Comments)    Causes redness and will take skin when removed  . Wellbutrin [Bupropion] Other (See Comments)    Suicidal intensions      Outpatient Medications Prior to Visit  Medication Sig Dispense Refill  . ascorbic acid (VITAMIN C) 1000 MG tablet Take 500 mg by mouth daily.     Marland Kitchen aspirin 81 MG chewable tablet Chew 81 mg by mouth daily.     Marland Kitchen b complex vitamins tablet Take 1 tablet by mouth daily.    . bimatoprost (LUMIGAN) 0.01 % SOLN Place 1 drop into both eyes at bedtime.    . Calcium Carb-Cholecalciferol (CALCIUM 500 + D3) 500-600 MG-UNIT TABS Take 1 Dose by mouth daily.     .  diclofenac (VOLTAREN) 75 MG EC tablet Take 1 tablet (75 mg total) by mouth 2 (two) times daily. 60 tablet 0  . lactobacillus acidophilus (BACID) TABS tablet Take 1 tablet by mouth daily. 20 billions units per day    . levothyroxine (SYNTHROID, LEVOTHROID) 125 MCG tablet Take 1 tablet (125 mcg total) by mouth daily before breakfast. 90 tablet 3  . losartan-hydrochlorothiazide (HYZAAR) 100-25 MG per tablet Take 1 tablet by mouth daily.     . nicotine polacrilex (COMMIT) 2 MG lozenge Take 2 mg by mouth as needed for smoking cessation.    . Omega-3 Fatty Acids (FISH OIL PO) Take 1 tablet by mouth daily.     No facility-administered medications prior to visit.      Past Medical History:  Diagnosis Date  . Allergy    hay fever  . Ascending aortic aneurysm ([place])   . Blood in stool   . BPPV (benign paroxysmal positional vertigo)   . Diverticulitis   . Fatty liver 05/28/10   per CT at [facility]  . Genital herpes   . GERD (gastroesophageal reflux disease)   . Glaucoma   . Helicobacter pylori infection   . Hiatal hernia 05/28/10   CT @ [facility]  . Hyperlipidemia   . Hypertension   . Hypothyroidism   . 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 Kidney stones   . Lung nodule seen on imaging study   .  Meniere's disease   . Obstructive sleep apnea       Review of Systems  Constitutional: Negative for chills and fever.  HENT: Positive for congestion.   Cardiovascular: Negative for chest pain, palpitations and leg swelling.  Gastrointestinal: Negative for abdominal distention, abdominal pain, anal bleeding, blood in stool, constipation, diarrhea, nausea, rectal pain and vomiting.      Objective:    BP 126/78 (BP Location: Left Arm, Patient Position: Sitting, Cuff Size: Normal)   Pulse 78   Temp 98.4 F (36.9 C) (Oral)   Resp 18   Ht 5\' 1"  (1.549 m)   Wt 134 lb (60.8 kg)   SpO2 97%   BMI 25.32 kg/m  Nursing note and vital signs reviewed.  Physical Exam    Constitutional: She is oriented to person, place, and time. She appears well-developed and well-nourished. No distress.  HENT:  Right Ear: Tympanic membrane, external ear and ear canal normal.  Left Ear: Tympanic membrane, external ear and ear canal normal.  Nose: Nose normal. Right sinus exhibits no maxillary sinus tenderness and no frontal sinus tenderness. Left sinus exhibits no frontal sinus tenderness.  Mouth/Throat: Uvula is midline, oropharynx is clear and moist and mucous membranes are normal.  Cochlear implants noted.   Cardiovascular: Normal rate, regular rhythm, normal heart sounds and intact distal pulses.   Pulmonary/Chest: Effort normal and breath sounds normal.  Abdominal: Normal appearance and bowel sounds are normal. She exhibits no mass. There is no hepatosplenomegaly. There is no tenderness. There is no rigidity, no rebound, no guarding, no CVA tenderness, no tenderness at McBurney's point and negative Murphy's sign.  Neurological: She is alert and oriented to person, place, and time.  Skin: Skin is warm and dry.  Psychiatric: She has a normal mood and affect. Her behavior is normal. Judgment and thought content normal.       Assessment & Plan:   Problem List Items Addressed This Visit      Digestive   GERD (gastroesophageal reflux disease)    Symptoms and exam consistent with exacerbation of gastroesophageal reflux with esophagitis. No significant findings on physical exam. Start omeprazole. Information on GERD related food intake provided. Follow-up if symptoms worsen or do not improve.      Relevant Medications   omeprazole (PRILOSEC) 40 MG capsule       I am having Ms. Sturtevant start on omeprazole. I am also having her maintain her ascorbic acid, lactobacillus acidophilus, b complex vitamins, losartan-hydrochlorothiazide, Omega-3 Fatty Acids (FISH OIL PO), bimatoprost, Calcium Carb-Cholecalciferol, nicotine polacrilex, levothyroxine, aspirin, and  diclofenac.   Meds ordered this encounter  Medications  . omeprazole (PRILOSEC) 40 MG capsule    Sig: Take 1 capsule (40 mg total) by mouth daily.    Dispense:  30 capsule    Refill:  0    Order Specific Question:   Supervising Provider    Answer:   Pricilla Holm A [[id number]]     Follow-up: Return if symptoms worsen or fail to improve.  Mauricio Po, FNP

## 2017-06-05 ENCOUNTER — Ambulatory Visit: Payer: Self-pay | Admitting: Internal Medicine

## 2017-06-11 ENCOUNTER — Encounter: Payer: Self-pay | Admitting: Internal Medicine

## 2017-07-01 ENCOUNTER — Other Ambulatory Visit: Payer: Self-pay | Admitting: Family

## 2017-08-19 ENCOUNTER — Encounter: Payer: Self-pay | Admitting: Internal Medicine

## 2017-08-27 ENCOUNTER — Other Ambulatory Visit: Payer: Self-pay | Admitting: Endocrinology

## 2017-08-27 ENCOUNTER — Encounter: Payer: Self-pay | Admitting: Endocrinology

## 2017-08-27 DIAGNOSIS — E039 Hypothyroidism, unspecified: Secondary | ICD-10-CM

## 2017-09-01 ENCOUNTER — Other Ambulatory Visit (INDEPENDENT_AMBULATORY_CARE_PROVIDER_SITE_OTHER): Payer: Medicare Other

## 2017-09-01 ENCOUNTER — Other Ambulatory Visit: Payer: Self-pay | Admitting: Endocrinology

## 2017-09-01 DIAGNOSIS — E039 Hypothyroidism, unspecified: Secondary | ICD-10-CM | POA: Diagnosis not present

## 2017-09-01 LAB — TSH: TSH: 5.7 u[IU]/mL — AB (ref 0.35–4.50)

## 2017-09-01 LAB — T4, FREE: FREE T4: 0.82 ng/dL (ref 0.60–1.60)

## 2017-09-01 MED ORDER — LEVOTHYROXINE SODIUM 137 MCG PO TABS: 137.0000 ug | ORAL_TABLET | Freq: Every day | ORAL | 1 refills | Status: DC

## 2017-09-03 DIAGNOSIS — H401131 Primary open-angle glaucoma, bilateral, mild stage: Secondary | ICD-10-CM | POA: Diagnosis not present

## 2017-09-08 DIAGNOSIS — H9313 Tinnitus, bilateral: Secondary | ICD-10-CM | POA: Diagnosis not present

## 2017-09-08 DIAGNOSIS — H903 Sensorineural hearing loss, bilateral: Secondary | ICD-10-CM | POA: Diagnosis not present

## 2017-10-01 ENCOUNTER — Other Ambulatory Visit: Payer: Self-pay | Admitting: Internal Medicine

## 2017-10-01 DIAGNOSIS — Z1231 Encounter for screening mammogram for malignant neoplasm of breast: Secondary | ICD-10-CM

## 2017-10-08 ENCOUNTER — Encounter: Payer: Self-pay | Admitting: Internal Medicine

## 2017-10-08 ENCOUNTER — Ambulatory Visit (INDEPENDENT_AMBULATORY_CARE_PROVIDER_SITE_OTHER): Payer: Medicare Other | Admitting: Internal Medicine

## 2017-10-08 VITALS — BP 138/82 | HR 77 | Temp 97.9°F | Resp 20 | Ht 61.0 in | Wt 138.0 lb

## 2017-10-08 DIAGNOSIS — E039 Hypothyroidism, unspecified: Secondary | ICD-10-CM

## 2017-10-08 DIAGNOSIS — E559 Vitamin D deficiency, unspecified: Secondary | ICD-10-CM

## 2017-10-08 DIAGNOSIS — Z23 Encounter for immunization: Secondary | ICD-10-CM

## 2017-10-08 DIAGNOSIS — I10 Essential (primary) hypertension: Secondary | ICD-10-CM

## 2017-10-08 DIAGNOSIS — E2839 Other primary ovarian failure: Secondary | ICD-10-CM

## 2017-10-08 DIAGNOSIS — Z0000 Encounter for general adult medical examination without abnormal findings: Secondary | ICD-10-CM

## 2017-10-08 NOTE — Progress Notes (Signed)
   Subjective:    Patient ID: Rachel Gardner, female    DOB: 08-17-1946, 71 y.o.   MRN: [id number]  HPI The patient is a 71 YO female coming in for physical.   PMH, [facility], social history reviewed and updated.   Review of Systems  Constitutional: Negative.   HENT: Negative.   Eyes: Negative.   Respiratory: Negative for cough, chest tightness and shortness of breath.   Cardiovascular: Negative for chest pain, palpitations and leg swelling.  Gastrointestinal: Negative for abdominal distention, abdominal pain, constipation, diarrhea, nausea and vomiting.       Fecal incontinence  Musculoskeletal: Negative.   Skin: Negative.   Neurological: Negative.   Psychiatric/Behavioral: Negative.       Objective:   Physical Exam  Constitutional: She is oriented to person, place, and time. She appears well-developed and well-nourished.  HENT:  Head: Normocephalic and atraumatic.  Eyes: EOM are normal.  Neck: Normal range of motion.  Cardiovascular: Normal rate and regular rhythm.   Pulmonary/Chest: Effort normal and breath sounds normal. No respiratory distress. She has no wheezes. She has no rales.  Abdominal: Soft. Bowel sounds are normal. She exhibits no distension. There is no tenderness. There is no rebound.  Musculoskeletal: She exhibits no edema.  Neurological: She is alert and oriented to person, place, and time. Coordination normal.  Skin: Skin is warm and dry.  Psychiatric: She has a normal mood and affect.   Vitals:   10/08/17 1419  BP: 138/82  Pulse: 77  Resp: 20  Temp: 97.9 F (36.6 C)  SpO2: 99%  Weight: 138 lb (62.6 kg)  Height: 5\' 1"  (1.549 m)      Assessment & Plan:  Flu shot given at visit.

## 2017-10-08 NOTE — Progress Notes (Signed)
Subjective:   Rachel Gardner is a 71 y.o. female who presents for Medicare Annual (Subsequent) preventive examination.  Review of Systems:  No ROS.  Medicare Wellness Visit. Additional risk factors are reflected in the social history.  Cardiac Risk Factors include: advanced age (>32men, >28 women);dyslipidemia;hypertension Sleep patterns: feels rested on waking and gets up 1 times nightly to void.    Home Safety/Smoke Alarms: Feels safe in home. Smoke alarms in place.  Living environment; residence and Firearm Safety: 1-story house/ trailer, no firearms Lives alone, no needs for DME, good support system. Seat Belt Safety/Bike Helmet: Wears seat belt.       Objective:     Vitals: BP 138/82   Pulse 77   Temp 97.9 F (36.6 C)   Resp 20   Ht 5\' 1"  (1.549 m)   Wt 138 lb (62.6 kg)   SpO2 99%   BMI 26.07 kg/m   Body mass index is 26.07 kg/m.   Tobacco History  Smoking Status  . Current Every Day Smoker  . Packs/day: 0.50  . Years: 41.00  . Types: Cigarettes  . Last attempt to quit: 01/18/2016  Smokeless Tobacco  . Never Used     Ready to quit: Not Answered Counseling given: Not Answered   Past Medical History:  Diagnosis Date  . Allergy    hay fever  . Ascending aortic aneurysm ([place])   . Blood in stool   . BPPV (benign paroxysmal positional vertigo)   . Diverticulitis   . Fatty liver 05/28/10   [facility]  . Genital herpes   . GERD (gastroesophageal reflux disease)   . Glaucoma   . Helicobacter pylori infection   . Hiatal hernia 05/28/10   CT @ [facility]  . Hyperlipidemia   . Hypertension   . Hypothyroidism   . 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 Kidney stones   . Lung nodule seen on imaging study   . Meniere's disease   . Obstructive sleep apnea    Past Surgical History:  Procedure Laterality Date  . ABDOMINAL HYSTERECTOMY  1995  . ANAL RECTAL MANOMETRY N/A 08/06/2016   Procedure: ANO RECTAL MANOMETRY;  Surgeon:  Doran Stabler, MD;  Location: [facility] ENDOSCOPY;  Service: Gastroenterology;  Laterality: N/A;  . APPENDECTOMY  1989  . BPPV     x 4 after L cochlear implant  . BREAST SURGERY  1989   breast reduction  . CHOLECYSTECTOMY  1989  . COCHLEAR IMPLANT     Left/2009; Right/2012  . LAPAROSCOPIC SIGMOID COLECTOMY  2012   with takedown of splenic flexure; cystoscopy with bilateral ureteral stent placement  . TOTAL THYROIDECTOMY  1995   Family History  Problem Relation Age of Onset  . Hypertension Mother   . Hypertension Father   . Hypertension Sister   . Prostate cancer Brother   . Hyperlipidemia Brother   . Colon cancer Maternal Aunt   . Prostate cancer Paternal Uncle   . Colon cancer Cousin        x 2; paternal  . Thyroid disease Sister    History  Sexual Activity  . Sexual activity: No    Outpatient Encounter Prescriptions as of 10/08/2017  Medication Sig  . ascorbic acid (VITAMIN C) 1000 MG tablet Take 500 mg by mouth daily.   Marland Kitchen aspirin 81 MG chewable tablet Chew 81 mg by mouth daily.   Marland Kitchen b complex vitamins tablet Take 1 tablet by  mouth daily.  . bimatoprost (LUMIGAN) 0.01 % SOLN Place 1 drop into both eyes at bedtime.  . Calcium Carb-Cholecalciferol (CALCIUM 500 + D3) 500-600 MG-UNIT TABS Take 1 Dose by mouth daily.   . diclofenac (VOLTAREN) 75 MG EC tablet Take 1 tablet (75 mg total) by mouth 2 (two) times daily.  Marland Kitchen lactobacillus acidophilus (BACID) TABS tablet Take 1 tablet by mouth daily. 20 billions units per day  . levothyroxine (SYNTHROID, LEVOTHROID) 137 MCG tablet Take 1 tablet (137 mcg total) by mouth daily before breakfast.  . losartan-hydrochlorothiazide (HYZAAR) 100-25 MG per tablet Take 1 tablet by mouth daily.   . nicotine polacrilex (COMMIT) 2 MG lozenge Take 2 mg by mouth as needed for smoking cessation.  . Omega-3 Fatty Acids (FISH OIL PO) Take 1 tablet by mouth daily.  Marland Kitchen omeprazole (PRILOSEC) 40 MG capsule TAKE 1 CAPSULE(40 MG) BY MOUTH DAILY   No  facility-administered encounter medications on file as of 10/08/2017.     Activities of Daily Living In your present state of health, do you have any difficulty performing the following activities: 10/08/2017  Hearing? N  Vision? N  Difficulty concentrating or making decisions? N  Walking or climbing stairs? N  Dressing or bathing? N  Doing errands, shopping? N  Preparing Food and eating ? N  Using the Toilet? N  In the past six months, have you accidently leaked urine? N  Do you have problems with loss of bowel control? Y  Managing your Medications? N  Managing your Finances? N  Housekeeping or managing your Housekeeping? N  Some recent data might be hidden    Patient Care Team: Hoyt Koch, MD as PCP - General (Internal Medicine) Lafayette Dragon, MD (Inactive) as Consulting Physician (Gastroenterology) Suella Broad, MD as Consulting Physician (Physical Medicine and Rehabilitation) Estevan Ryder, MD as Referring Physician (Cardiology) Renato Shin, MD as Consulting Physician (Endocrinology) Vicie Mutters, MD as Consulting Physician (Otolaryngology)    Assessment:    Physical assessment deferred to PCP.  Exercise Activities and Dietary recommendations Current Exercise Habits: Structured exercise class, Type of exercise: walking, Time (Minutes): 45, Frequency (Times/Week): 5, Weekly Exercise (Minutes/Week): 225, Intensity: Mild, Exercise limited by: None identified  Diet (meal preparation, eat out, water intake, caffeinated beverages, dairy products, fruits and vegetables): in general, a "healthy" diet  , well balanced, eats a variety of fruits and vegetables daily, limits salt, fat/cholesterol, sugar, caffeine, drinks 6-8 glasses of water daily.   Goals    . Exercise 3x per week (30 min per time)          Keeps walking and staying healthy; Wants to maintain her health Will continue to do arm band stretches 3 x a week     . Maintain current health status            Stay as active and as independent as possible, enjoy family, life and continue to help the community and those in need.      Fall Risk Fall Risk  10/08/2017 09/19/2016 04/25/2015  Falls in the past year? No No Yes  Number falls in past yr: - - 1  Injury with Fall? - - No  Comment - - Cervical herniation ; tx by PT  Follow up - - Falls prevention discussed   Depression Screen PHQ 2/9 Scores 10/08/2017 09/19/2016 04/25/2015  PHQ - 2 Score 0 0 0     Cognitive Function MMSE - Mini Mental State Exam 10/08/2017 04/25/2015  Not completed: -  Unable to complete  Orientation to time 5 -  Orientation to Place 5 -        Immunization History  Administered Date(s) Administered  . Influenza, High Dose Seasonal PF 10/08/2017  . Influenza,inj,Quad PF,6+ Mos 10/03/2013, 11/16/2014, 09/24/2015, 09/19/2016  . Pneumococcal Conjugate-13 10/03/2013  . Pneumococcal Polysaccharide-23 04/28/2008, 09/24/2015  . Td 08/09/2014   Screening Tests Health Maintenance  Topic Date Due  . Hepatitis C Screening  May 09, 1946  . MAMMOGRAM  11/03/2018  . COLONOSCOPY  01/27/2021  . TETANUS/TDAP  08/09/2024  . INFLUENZA VACCINE  Completed  . DEXA SCAN  Completed  . PNA vac Low Risk Adult  Completed      Plan:    Continue doing brain stimulating activities (puzzles, reading, adult coloring books, staying active) to keep memory sharp.   Continue to eat heart healthy diet (full of fruits, vegetables, whole grains, lean protein, water--limit salt, fat, and sugar intake) and increase physical activity as tolerated.  I have personally reviewed and noted the following in the patient's chart:   . Medical and social history . Use of alcohol, tobacco or illicit drugs  . Current medications and supplements . Functional ability and status . Nutritional status . Physical activity . Advanced directives . List of other physicians . Vitals . Screenings to include cognitive, depression, and falls . Referrals and  appointments  In addition, I have reviewed and discussed with patient certain preventive protocols, quality metrics, and best practice recommendations. A written personalized care plan for preventive services as well as general preventive health recommendations were provided to patient.     Michiel Cowboy, RN  10/08/2017

## 2017-10-08 NOTE — Patient Instructions (Signed)
Continue doing brain stimulating activities (puzzles, reading, adult coloring books, staying active) to keep memory sharp.   Continue to eat heart healthy diet (full of fruits, vegetables, whole grains, lean protein, water--limit salt, fat, and sugar intake) and increase physical activity as tolerated.   Rachel Gardner , Thank you for taking time to come for your Medicare Wellness Visit. I appreciate your ongoing commitment to your health goals. Please review the following plan we discussed and let me know if I can assist you in the future.   These are the goals we discussed: Goals    . Exercise 3x per week (30 min per time)          Keeps walking and staying healthy; Wants to maintain her health Will continue to do arm band stretches 3 x a week     . Maintain current health status          Stay as active and as independent as possible, enjoy family, life and continue to help the community and those in need.       This is a list of the screening recommended for you and due dates:  Health Maintenance  Topic Date Due  .  Hepatitis C: One time screening is recommended by Center for Disease Control  (CDC) for  adults born from [birth year] through 1964.   07/17/1946  . Flu Shot  07/29/2017  . Mammogram  11/03/2018  . Colon Cancer Screening  01/27/2021  . Tetanus Vaccine  08/09/2024  . DEXA scan (bone density measurement)  Completed  . Pneumonia vaccines  Completed   Influenza Virus Vaccine injection What is this medicine? INFLUENZA VIRUS VACCINE (in floo EN zuh VAHY ruhs vak SEEN) helps to reduce the risk of getting influenza also known as the flu. The vaccine only helps protect you against some strains of the flu. This medicine may be used for other purposes; ask your health care provider or pharmacist if you have questions. COMMON BRAND NAME(S): Afluria, Agriflu, Alfuria, FLUAD, Fluarix, Fluarix Quadrivalent, Flublok, Flublok Quadrivalent, FLUCELVAX, Flulaval, Fluvirin, Fluzone, Fluzone  High-Dose, Fluzone Intradermal What should I tell my health care provider before I take this medicine? They need to know if you have any of these conditions: -bleeding disorder like hemophilia -fever or infection -Guillain-Barre syndrome or other neurological problems -immune system problems -infection with the human immunodeficiency virus (HIV) or AIDS -low blood platelet counts -multiple sclerosis -an unusual or allergic reaction to influenza virus vaccine, latex, other medicines, foods, dyes, or preservatives. Different brands of vaccines contain different allergens. Some may contain latex or eggs. Talk to your doctor about your allergies to make sure that you get the right vaccine. -pregnant or trying to get pregnant -breast-feeding How should I use this medicine? This vaccine is for injection into a muscle or under the skin. It is given by a health care professional. A copy of Vaccine Information Statements will be given before each vaccination. Read this sheet carefully each time. The sheet may change frequently. Talk to your healthcare provider to see which vaccines are right for you. Some vaccines should not be used in all age groups. Overdosage: If you think you have taken too much of this medicine contact a poison control center or emergency room at once. NOTE: This medicine is only for you. Do not share this medicine with others. What if I miss a dose? This does not apply. What may interact with this medicine? -chemotherapy or radiation therapy -medicines that lower your immune system  like etanercept, anakinra, infliximab, and adalimumab -medicines that treat or prevent blood clots like warfarin -phenytoin -steroid medicines like prednisone or cortisone -theophylline -vaccines This list may not describe all possible interactions. Give your health care provider a list of all the medicines, herbs, non-prescription drugs, or dietary supplements you use. Also tell them if you  smoke, drink alcohol, or use illegal drugs. Some items may interact with your medicine. What should I watch for while using this medicine? Report any side effects that do not go away within 3 days to your doctor or health care professional. Call your health care provider if any unusual symptoms occur within 6 weeks of receiving this vaccine. You may still catch the flu, but the illness is not usually as bad. You cannot get the flu from the vaccine. The vaccine will not protect against colds or other illnesses that may cause fever. The vaccine is needed every year. What side effects may I notice from receiving this medicine? Side effects that you should report to your doctor or health care professional as soon as possible: -allergic reactions like skin rash, itching or hives, swelling of the face, lips, or tongue Side effects that usually do not require medical attention (report to your doctor or health care professional if they continue or are bothersome): -fever -headache -muscle aches and pains -pain, tenderness, redness, or swelling at the injection site -tiredness This list may not describe all possible side effects. Call your doctor for medical advice about side effects. You may report side effects to FDA at 1-800-FDA-1088. Where should I keep my medicine? The vaccine will be given by a health care professional in a clinic, pharmacy, doctor's office, or other health care setting. You will not be given vaccine doses to store at home. NOTE: This sheet is a summary. It may not cover all possible information. If you have questions about this medicine, talk to your doctor, pharmacist, or health care provider.  2018 Elsevier/Gold Standard (2015-07-06 10:07:28)

## 2017-10-08 NOTE — Progress Notes (Signed)
Pre visit review using our clinic review tool, if applicable. No additional management support is needed unless otherwise documented below in the visit note. 

## 2017-10-09 ENCOUNTER — Encounter: Payer: Self-pay | Admitting: Internal Medicine

## 2017-10-09 NOTE — Assessment & Plan Note (Signed)
BP at goal on losartan/hctz. Recent CMP at goal.

## 2017-10-09 NOTE — Progress Notes (Signed)
Patient ID: Rachel Gardner, female   DOB: [birth year], 71 y.o.   MRN: [id number] Medical screening examination/treatment/procedure(s) were performed by non-physician practitioner and as supervising physician I was immediately available for consultation/collaboration. I agree with above. Hoyt Koch, MD

## 2017-10-09 NOTE — Assessment & Plan Note (Signed)
Ordered bone density as it is due.

## 2017-10-09 NOTE — Assessment & Plan Note (Signed)
Recent change to synthroid 137 mcg daily with endo and she is following with them.

## 2017-10-09 NOTE — Assessment & Plan Note (Signed)
Colonoscopy up to date. Mammogram up to date. Flu shot given at visit. Counseled about shingrix. Pneumonia and tetanus up to date. Given screening recommendations for 10 year. Counseled about sun safety and mole surveillance.

## 2017-11-01 ENCOUNTER — Encounter: Payer: Self-pay | Admitting: Internal Medicine

## 2017-11-05 ENCOUNTER — Ambulatory Visit
Admission: RE | Admit: 2017-11-05 | Discharge: 2017-11-05 | Disposition: A | Discharge status: Home / Self Care | Payer: Medicare Other | Source: Ambulatory Visit | Attending: Internal Medicine | Admitting: Internal Medicine

## 2017-11-05 DIAGNOSIS — Z1231 Encounter for screening mammogram for malignant neoplasm of breast: Secondary | ICD-10-CM

## 2017-11-26 ENCOUNTER — Ambulatory Visit (INDEPENDENT_AMBULATORY_CARE_PROVIDER_SITE_OTHER)
Admission: RE | Admit: 2017-11-26 | Discharge: 2017-11-26 | Disposition: A | Discharge status: Home / Self Care | Payer: Medicare Other | Source: Ambulatory Visit

## 2017-11-26 DIAGNOSIS — E2839 Other primary ovarian failure: Secondary | ICD-10-CM

## 2017-11-29 DIAGNOSIS — E2839 Other primary ovarian failure: Secondary | ICD-10-CM | POA: Diagnosis not present

## 2017-12-25 ENCOUNTER — Other Ambulatory Visit: Payer: Self-pay | Admitting: Surgery

## 2017-12-25 DIAGNOSIS — I712 Thoracic aortic aneurysm, without rupture, unspecified: Secondary | ICD-10-CM

## 2017-12-29 ENCOUNTER — Encounter: Payer: Self-pay | Admitting: Internal Medicine

## 2017-12-30 ENCOUNTER — Encounter: Payer: Self-pay | Admitting: Surgery

## 2017-12-31 ENCOUNTER — Ambulatory Visit
Admission: RE | Admit: 2017-12-31 | Discharge: 2017-12-31 | Disposition: A | Discharge status: Home / Self Care | Payer: Medicare Other | Source: Ambulatory Visit | Attending: Surgery | Admitting: Surgery

## 2017-12-31 ENCOUNTER — Ambulatory Visit: Payer: Medicare Other | Admitting: Family Medicine

## 2017-12-31 ENCOUNTER — Encounter: Payer: Self-pay | Admitting: Family Medicine

## 2017-12-31 VITALS — BP 134/76 | HR 76 | Temp 98.3°F | Ht 61.0 in | Wt 150.0 lb

## 2017-12-31 DIAGNOSIS — R05 Cough: Secondary | ICD-10-CM | POA: Diagnosis not present

## 2017-12-31 DIAGNOSIS — I712 Thoracic aortic aneurysm, without rupture, unspecified: Secondary | ICD-10-CM

## 2017-12-31 DIAGNOSIS — R918 Other nonspecific abnormal finding of lung field: Secondary | ICD-10-CM | POA: Diagnosis not present

## 2017-12-31 DIAGNOSIS — R059 Cough, unspecified: Secondary | ICD-10-CM | POA: Insufficient documentation

## 2017-12-31 NOTE — Assessment & Plan Note (Signed)
Likely viral in nature.  - Counseled on supportive care  - f/u PRN.  - Has a CT chest for her maintenance for a slight aneurysmal dilatation of the ascending thoracic aorta

## 2017-12-31 NOTE — Progress Notes (Signed)
Rachel Gardner - 72 y.o. female MRN [id number]  Date of birth: 12-19-1946  SUBJECTIVE:  Including CC & ROS.  Chief Complaint  Patient presents with  . Cough    Rachel Gardner is a 72 y.o. female that is presenting with a cough. This has been ongoing for one week. Admits to some wheezing and coughing up mucous. She has taken mucinex and cold and sinus with no improvement. Has not had any fevers. Feels like her symptoms are worse in the morning. Feels like her symptoms are staying the same. Symptoms are moderate in nature.   Review of CT angio chest shows slight aneurysmal dilatation of the ascending thoracic aorta.  Also shows an 8 mm posterior left lower lobe nodule and not significantly changed from prior study. CAD of anterior descending coronary artery    Review of Systems  Constitutional: Negative for fever.  HENT: Positive for rhinorrhea and sinus pain. Negative for trouble swallowing.   Respiratory: Positive for choking. Negative for shortness of breath.   Cardiovascular: Negative for chest pain.  Gastrointestinal: Negative for abdominal pain.  Musculoskeletal: Negative for gait problem.    HISTORY: Past Medical, Surgical, Social, and Family History Reviewed & Updated per EMR.   Pertinent Historical Findings include:  Past Medical History:  Diagnosis Date  . Allergy    hay fever  . Ascending aortic aneurysm ([place])   . Blood in stool   . BPPV (benign paroxysmal positional vertigo)   . Diverticulitis   . Fatty liver 05/28/10   per CT at [facility]  . Genital herpes   . GERD (gastroesophageal reflux disease)   . Glaucoma   . Helicobacter pylori infection   . Hiatal hernia 05/28/10   CT @ [facility]  . Hyperlipidemia   . Hypertension   . Hypothyroidism   . 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 Kidney stones   . Lung nodule seen on imaging study   . Meniere's disease   . Obstructive sleep apnea     Past Surgical History:  Procedure Laterality Date    . ABDOMINAL HYSTERECTOMY  1995  . ANAL RECTAL MANOMETRY N/A 08/06/2016   Procedure: ANO RECTAL MANOMETRY;  Surgeon: Doran Stabler, MD;  Location: [facility];  Service: Gastroenterology;  Laterality: N/A;  . APPENDECTOMY  1989  . BPPV     x 4 after L cochlear implant  . BREAST SURGERY  1989   breast reduction  . CHOLECYSTECTOMY  1989  . COCHLEAR IMPLANT     Left/2009; Right/2012  . LAPAROSCOPIC SIGMOID COLECTOMY  2012   with takedown of splenic flexure; cystoscopy with bilateral ureteral stent placement  . REDUCTION MAMMAPLASTY Bilateral 1989   [place]  . TOTAL THYROIDECTOMY  1995    Allergies  Allergen Reactions  . Morphine And Related     Nausea Can tolerate Dilaudid  . Statins Other (See Comments)    Joint pain   . Tape Other (See Comments)    Causes redness and will take skin when removed  . Wellbutrin [Bupropion] Other (See Comments)    Suicidal intensions    Family History  Problem Relation Age of Onset  . Hypertension Mother   . Hypertension Father   . Hypertension Sister   . Prostate cancer Brother   . Hyperlipidemia Brother   . Colon cancer Maternal Aunt   . Prostate cancer Paternal Uncle   . Colon cancer Cousin        x 2; paternal  .  Thyroid disease Sister      Social History   Socioeconomic History  . Marital status: Divorced    Spouse name: Not on file  . Number of children: 3  . Years of education: Not on file  . Highest education level: Not on file  Social Needs  . Financial resource strain: Not on file  . Food insecurity - worry: Not on file  . Food insecurity - inability: Not on file  . Transportation needs - medical: Not on file  . Transportation needs - non-medical: Not on file  Occupational History  . Occupation: retired Marine scientist  Tobacco Use  . Smoking status: Current Every Day Smoker    Packs/day: 0.50    Years: 41.00    Pack years: 20.50    Types: Cigarettes    Last attempt to quit: 01/18/2016    Years since quitting: 1.9  .  Smokeless tobacco: Never Used  Substance and Sexual Activity  . Alcohol use: Yes    Alcohol/week: 0.0 oz    Comment: rarely  . Drug use: No  . Sexual activity: No  Other Topics Concern  . Not on file  Social History Narrative   Regular exercise: walk; 1 mile a day / walk 5 miles weekend;    Caffeine use: 1 cup of coffee in the AM      3 children        PHYSICAL EXAM:  VS: BP 134/76 (BP Location: Left Arm, Patient Position: Sitting, Cuff Size: Normal)   Pulse 76   Temp 98.3 F (36.8 C) (Oral)   Ht 5\' 1"  (1.549 m)   Wt 150 lb (68 kg)   SpO2 98%   BMI 28.34 kg/m  Physical Exam Gen: NAD, alert, cooperative with exam,  ENT: normal lips, normal nasal mucosa, cochlear implant in right ear, normal oropharynx, no cervical lymphadenopathy Eye: normal EOM, normal conjunctiva and lids CV:  no edema, +2 pedal pulses, regular rate and rhythm, S1-S2   Resp: no accessory muscle use, non-labored, clear to auscultation bilaterally, no crackles or wheezes GI: no masses or tenderness, no hernia  Skin: no rashes, no areas of induration  Neuro: normal tone, normal sensation to touch Psych:  normal insight, alert and oriented MSK: Normal gait, normal strength     ASSESSMENT & PLAN:   Cough Likely viral in nature.  - Counseled on supportive care  - f/u PRN.  - Has a CT chest for her maintenance for a slight aneurysmal dilatation of the ascending thoracic aorta

## 2017-12-31 NOTE — Patient Instructions (Signed)
Please try things such as zyrtec-D or allegra-D which is an antihistamine and decongestant.   Please try afrin which will help with nasal congestion but use for only three days.   Please also try using a netti pot on a regular occasion.  Honey can help with a sore throat.   Delsym can work for cough.   Vicks can help with congestion.

## 2018-01-13 ENCOUNTER — Ambulatory Visit: Payer: Medicare Other | Admitting: Surgery

## 2018-01-20 ENCOUNTER — Other Ambulatory Visit: Payer: Self-pay

## 2018-01-20 ENCOUNTER — Ambulatory Visit: Payer: Medicare Other | Admitting: Surgery

## 2018-01-20 ENCOUNTER — Encounter: Payer: Self-pay | Admitting: Surgery

## 2018-01-20 VITALS — BP 157/91 | HR 89 | Resp 18 | Ht 61.0 in | Wt 139.0 lb

## 2018-01-20 DIAGNOSIS — I712 Thoracic aortic aneurysm, without rupture: Secondary | ICD-10-CM

## 2018-01-20 DIAGNOSIS — I7121 Aneurysm of the ascending aorta, without rupture: Secondary | ICD-10-CM

## 2018-01-22 ENCOUNTER — Encounter: Payer: Self-pay | Admitting: Surgery

## 2018-01-22 NOTE — Progress Notes (Signed)
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 She had a CT of the neck and thoracic spine in 11/2014 after falling off a stool and injuring her neck that showed an ascending aortic aneurysm. CT of the chest on 01/03/2015 showed a 7 mm subpleural nodule in the LLL of the lung and a 4 cm fusiform ascending aortic aneurysm. I saw her on 01/06/2015 for the first time and reviewed the CT and MRA scan reports from her previous scans which she has with her. The aorta was 3.8 cm in 2006 by CT, 3.4 cm in 2008 by MRA and 3.4 cm in 2012 by CT. It was essentially unchanged since 2006. The left lower lobe nodule had also been present since 2006 when it measured 78mm. It was minimally enlarged after 9 years and  smoothly marginated and almost certainly benign. When I saw her in January 2017 her CT showed that the aneursym had slightly enlarged to 4.4 cm and the lung nodule was 7 mm. She also had some symptoms of exertional shortness of breath and chest aching with fatigue and was referred to Dr. Marlou Porch for evaluation. She had a nuclear stress test that was a low risk normal study. I saw her again in 12/2016 and the ascending aorta measured 4.0 cm. She says that she has been feeling fairly well over the past year. She recently had some bronchitis after a URI but is feeling better.    Current Outpatient Medications  Medication Sig Dispense Refill  . ascorbic acid (VITAMIN C) 1000 MG tablet Take 500 mg by mouth daily.     Marland Kitchen aspirin 81 MG chewable tablet Chew 81 mg by mouth daily.     Marland Kitchen b complex vitamins tablet Take 1 tablet by mouth daily.    . bimatoprost (LUMIGAN) 0.01 % SOLN Place 1 drop into both eyes at bedtime.    . Calcium Carb-Cholecalciferol (CALCIUM 500 + D3) 500-600 MG-UNIT TABS Take 1 Dose by mouth daily.     Marland Kitchen lactobacillus acidophilus (BACID) TABS  tablet Take 1 tablet by mouth daily. 20 billions units per day    . levothyroxine (SYNTHROID, LEVOTHROID) 137 MCG tablet Take 1 tablet (137 mcg total) by mouth daily before breakfast. 90 tablet 1  . losartan-hydrochlorothiazide (HYZAAR) 100-25 MG per tablet Take 1 tablet by mouth daily.     . nicotine polacrilex (COMMIT) 2 MG lozenge Take 2 mg by mouth as needed for smoking cessation.    . Omega-3 Fatty Acids (FISH OIL PO) Take 1 tablet by mouth daily.    Marland Kitchen omeprazole (PRILOSEC) 40 MG capsule TAKE 1 CAPSULE(40 MG) BY MOUTH DAILY 30 capsule 0   No current facility-administered medications for this visit.      Physical Exam: BP (!) 157/91 (BP Location: Left Arm, Patient Position: Sitting, Cuff Size: Normal)   Pulse 89   Resp 18   Ht 5\' 1"  (1.549 m)   Wt 139 lb (63 kg)   SpO2 98% Comment: RA  BMI 26.26 kg/m  She looks well Cardiac exam shows a regular rate and rhythm with no murmur Lungs are clear    Diagnostic Tests:  CLINICAL DATA:  Follow-up pulmonary nodules  EXAM: CT CHEST WITHOUT CONTRAST  TECHNIQUE: Multidetector CT imaging of the chest was performed following the standard protocol  without IV contrast.  COMPARISON:  12/08/2016  FINDINGS: Cardiovascular: Somewhat limited due to the lack of IV contrast. Aortic atherosclerotic calcifications are again seen. The ascending aorta measures 4.1 cm slightly more prominent than that seen on the prior exam. Coronary calcifications are noted. No significant cardiac enlargement is seen.  Mediastinum/Nodes: Sliding hiatal hernia is noted. No sizable hilar or mediastinal adenopathy is noted.  Lungs/Pleura: Lungs are well aerated bilaterally. Stable 8 mm subpleural pulmonary nodule is noted in the left lower lobe. Minimal scarring is noted in the right lung base and apices. No focal infiltrate or sizable effusion is seen.  Upper Abdomen: Gallbladder has been surgically removed. Visualized upper abdomen is otherwise  unremarkable.  Musculoskeletal: Degenerative changes of the thoracic spine are seen.  IMPRESSION: Ascending aorta aneurysm measuring 4.1 cm. This has increased by 1 mm from the prior exam. Recommend annual imaging followup by CTA or MRA. This recommendation follows 2010 ACCF/AHA/AATS/ACR/ASA/SCA/SCAI/SIR/STS/SVM Guidelines for the Diagnosis and Management of Patients with Thoracic Aortic Disease. Circulation. 2010; 121: E675-Q492  Stable 8 mm left lower lobe nodule. Given its long-term stability dating back to 2015 this is benign in etiology and no further follow-up is recommended.  Aortic Atherosclerosis (ICD10-I70.0).   Electronically Signed   By: Inez Catalina M.D.   On: 12/31/2017 16:03  Impression:  The fusiform ascending aortic aneurysm has been stable.  It was measured at 4.4 cm 2 years ago and then was 4.0 cm last year.  It is now 4.1 cm so I think it is probably stable.  I think the variability in the measurements is due to the variability in each study and the reading radiologist.  The 8 mm left lower lobe lung nodule is stable and certainly benign.  I have stressed the importance of good blood pressure control.  Plan:  She will return to see me in 1 year with a CTA chest.  I spent 15 minutes performing this established patient evaluation and > 50% of this time was spent face to face counseling and coordinating the care of this patient's aortic aneurysm.    Gaye Pollack, MD [facility] [phone]

## 2018-01-27 ENCOUNTER — Ambulatory Visit: Payer: Medicare Other | Admitting: Surgery

## 2018-02-11 ENCOUNTER — Encounter: Payer: Self-pay | Admitting: Internal Medicine

## 2018-02-11 DIAGNOSIS — E785 Hyperlipidemia, unspecified: Secondary | ICD-10-CM

## 2018-02-11 DIAGNOSIS — E039 Hypothyroidism, unspecified: Secondary | ICD-10-CM

## 2018-02-11 DIAGNOSIS — R7301 Impaired fasting glucose: Secondary | ICD-10-CM

## 2018-02-18 ENCOUNTER — Other Ambulatory Visit (INDEPENDENT_AMBULATORY_CARE_PROVIDER_SITE_OTHER): Payer: Medicare Other

## 2018-02-18 DIAGNOSIS — R7301 Impaired fasting glucose: Secondary | ICD-10-CM | POA: Diagnosis not present

## 2018-02-18 DIAGNOSIS — E785 Hyperlipidemia, unspecified: Secondary | ICD-10-CM | POA: Diagnosis not present

## 2018-02-18 DIAGNOSIS — E039 Hypothyroidism, unspecified: Secondary | ICD-10-CM | POA: Diagnosis not present

## 2018-02-18 LAB — COMPREHENSIVE METABOLIC PANEL
ALBUMIN: 3.9 g/dL (ref 3.5–5.2)
ALK PHOS: 76 U/L (ref 39–117)
ALT: 17 U/L (ref 0–35)
AST: 16 U/L (ref 0–37)
BUN: 23 mg/dL (ref 6–23)
CO2: 29 mEq/L (ref 19–32)
CREATININE: 0.97 mg/dL (ref 0.40–1.20)
Calcium: 9.8 mg/dL (ref 8.4–10.5)
Chloride: 102 mEq/L (ref 96–112)
GFR: 60.18 mL/min (ref >60.00)
Glucose, Bld: 97 mg/dL (ref 70–99)
POTASSIUM: 3.8 meq/L (ref 3.5–5.1)
SODIUM: 138 meq/L (ref 135–145)
TOTAL PROTEIN: 7.7 g/dL (ref 6.0–8.3)
Total Bilirubin: 0.6 mg/dL (ref 0.2–1.2)

## 2018-02-18 LAB — CBC
HEMATOCRIT: 43.5 % (ref 36.0–46.0)
Hemoglobin: 14.6 g/dL (ref 12.0–15.0)
MCHC: 33.7 g/dL (ref 30.0–36.0)
MCV: 86.5 fl (ref 78.0–100.0)
Platelets: 242 10*3/uL (ref 150.0–400.0)
RBC: 5.02 Mil/uL (ref 3.87–5.11)
RDW: 12.6 % (ref 11.5–15.5)
WBC: 9.4 10*3/uL (ref 4.0–10.5)

## 2018-02-18 LAB — LIPID PANEL
CHOLESTEROL: 195 mg/dL (ref 0–200)
HDL: 38.6 mg/dL — ABNORMAL LOW (ref >39.00)
LDL Cholesterol: 119 mg/dL — ABNORMAL HIGH (ref 0–99)
NonHDL: 156.8
Total CHOL/HDL Ratio: 5
Triglycerides: 190 mg/dL — ABNORMAL HIGH (ref 0.0–149.0)
VLDL: 38 mg/dL (ref 0.0–40.0)

## 2018-02-18 LAB — HEMOGLOBIN A1C: HEMOGLOBIN A1C: 5.9 % (ref 4.6–6.5)

## 2018-02-18 LAB — T4, FREE: Free T4: 0.93 ng/dL (ref 0.60–1.60)

## 2018-02-18 LAB — TSH: TSH: 0.52 u[IU]/mL (ref 0.35–4.50)

## 2018-03-01 ENCOUNTER — Other Ambulatory Visit: Payer: Self-pay | Admitting: Endocrinology

## 2018-03-02 ENCOUNTER — Other Ambulatory Visit: Payer: Self-pay

## 2018-03-04 DIAGNOSIS — H401131 Primary open-angle glaucoma, bilateral, mild stage: Secondary | ICD-10-CM | POA: Diagnosis not present

## 2018-04-21 ENCOUNTER — Encounter: Payer: Self-pay | Admitting: Cardiology

## 2018-04-25 ENCOUNTER — Encounter: Payer: Self-pay | Admitting: Internal Medicine

## 2018-04-26 ENCOUNTER — Encounter: Payer: Self-pay | Admitting: Internal Medicine

## 2018-04-26 ENCOUNTER — Ambulatory Visit: Payer: Medicare Other | Admitting: Internal Medicine

## 2018-04-26 DIAGNOSIS — R109 Unspecified abdominal pain: Secondary | ICD-10-CM | POA: Insufficient documentation

## 2018-04-26 NOTE — Progress Notes (Signed)
   Subjective:    Patient ID: Rachel Gardner, female    DOB: 09-Oct-1946, 72 y.o.   MRN: [id number]  HPI The patient is a 72 YO female coming in for tick bite. She found the tick on Saturday and tried to remove it. She thinks that part of the tick was left in. Denies swelling. Mild redness around the area. Denies fevers or chills. Denies muscle aches or fatigue.   Review of Systems  Constitutional: Negative.   HENT: Negative.   Eyes: Negative.   Respiratory: Negative for cough, chest tightness and shortness of breath.   Cardiovascular: Negative for chest pain, palpitations and leg swelling.  Gastrointestinal: Negative for abdominal distention, abdominal pain, constipation, diarrhea, nausea and vomiting.  Musculoskeletal: Positive for myalgias.  Skin: Negative.   Neurological: Negative.   Psychiatric/Behavioral: Negative.       Objective:   Physical Exam  Constitutional: She is oriented to person, place, and time. She appears well-developed and well-nourished.  HENT:  Head: Normocephalic and atraumatic.  Eyes: EOM are normal.  Neck: Normal range of motion.  Cardiovascular: Normal rate and regular rhythm.  Pulmonary/Chest: Effort normal and breath sounds normal. No respiratory distress. She has no wheezes. She has no rales.  Abdominal: Soft. Bowel sounds are normal. She exhibits no distension. There is no tenderness. There is no rebound.  Musculoskeletal: She exhibits no edema.  Neurological: She is alert and oriented to person, place, and time. Coordination normal.  Skin: Skin is warm and dry.  Wound examined during visit without retained tick, no surrounding erythema or bullseye rash   Vitals:   04/26/18 1443  BP: 124/72  Pulse: 88  Temp: 98.7 F (37.1 C)  TempSrc: Oral  SpO2: 96%  Weight: 138 lb (62.6 kg)  Height: 5\' 1"  (1.549 m)      Assessment & Plan:

## 2018-04-26 NOTE — Assessment & Plan Note (Signed)
Acute due to tick bite, wound explored during visit to ensure all tick parts removed. No antibiotics indicated at this time. Patient brought tick for inspection and not deer tick.

## 2018-04-26 NOTE — Patient Instructions (Signed)
Let us know if there are any problems.

## 2018-05-02 ENCOUNTER — Encounter: Payer: Self-pay | Admitting: Internal Medicine

## 2018-05-05 ENCOUNTER — Ambulatory Visit: Payer: Medicare Other | Admitting: Cardiology

## 2018-05-05 MED ORDER — DOXYCYCLINE HYCLATE 100 MG PO TABS: 100.0000 mg | ORAL_TABLET | Freq: Two times a day (BID) | ORAL | 0 refills | Status: DC

## 2018-05-30 ENCOUNTER — Other Ambulatory Visit: Payer: Self-pay | Admitting: Endocrinology

## 2018-05-30 NOTE — Telephone Encounter (Signed)
Please refill x 1 Ov is due  

## 2018-06-01 ENCOUNTER — Other Ambulatory Visit: Payer: Self-pay | Admitting: Endocrinology

## 2018-07-14 ENCOUNTER — Encounter: Payer: Self-pay | Admitting: Internal Medicine

## 2018-07-15 ENCOUNTER — Ambulatory Visit: Payer: Medicare Other | Admitting: Internal Medicine

## 2018-07-15 ENCOUNTER — Encounter: Payer: Self-pay | Admitting: Internal Medicine

## 2018-07-15 DIAGNOSIS — R05 Cough: Secondary | ICD-10-CM

## 2018-07-15 DIAGNOSIS — R059 Cough, unspecified: Secondary | ICD-10-CM

## 2018-07-15 MED ORDER — DOXYCYCLINE HYCLATE 100 MG PO TABS: 100.0000 mg | ORAL_TABLET | Freq: Two times a day (BID) | ORAL | 0 refills | Status: DC

## 2018-07-15 MED ORDER — PSEUDOEPHEDRINE-GUAIFENESIN ER 60-600 MG PO TB12: 1.0000 | ORAL_TABLET | Freq: Two times a day (BID) | ORAL | 0 refills | Status: DC

## 2018-07-15 NOTE — Patient Instructions (Addendum)
We have sent in the doxycycline to take 1 pill twice a day to help the lungs.   We have sent in mucinex to take to break up the mucus.

## 2018-07-15 NOTE — Progress Notes (Signed)
   Subjective:    Patient ID: Rachel Gardner, female    DOB: 1946/09/21, 72 y.o.   MRN: [id number]  HPI The patient is a 72 YO female coming in for cough. Started about 1-2 weeks ago while on vacation and having family stay at her home during renovation. She was coughing up clear sputum initially and now yellow sputum. She is having mild SOB but still able to do normal activities. No fevers but some chills. Denies nasal drainage or congestion. Denies headaches or sinus pressure. She is a long term smoker and still smoking. She had CT chest done earlier this year for follow up of aortic aneurysm with a stable benign nodule and no new nodules. She denies weight loss or change. Overall worsening since onset.   Review of Systems  Constitutional: Positive for chills. Negative for activity change, appetite change, fatigue, fever and unexpected weight change.  HENT: Negative for congestion, ear discharge, ear pain, postnasal drip, rhinorrhea, sinus pressure, sinus pain, sneezing, sore throat, tinnitus, trouble swallowing and voice change.   Eyes: Negative.   Respiratory: Positive for cough and shortness of breath. Negative for chest tightness and wheezing.   Cardiovascular: Negative.   Gastrointestinal: Negative.   Musculoskeletal: Negative.   Neurological: Negative.       Objective:   Physical Exam  Constitutional: She is oriented to person, place, and time. She appears well-developed and well-nourished.  HENT:  Head: Normocephalic and atraumatic.  Eyes: EOM are normal.  Neck: Normal range of motion.  Cardiovascular: Normal rate and regular rhythm.  Pulmonary/Chest: Effort normal. No respiratory distress. She has no wheezes. She has no rales.  Some increased rhonchi which partially clear with cough  Abdominal: Soft. Bowel sounds are normal. She exhibits no distension. There is no tenderness. There is no rebound.  Musculoskeletal: She exhibits no edema.  Neurological: She is alert and oriented  to person, place, and time. Coordination normal.  Skin: Skin is warm and dry.   Vitals:   07/15/18 0839  BP: 120/78  Pulse: 82  Temp: 98.6 F (37 C)  TempSrc: Oral  SpO2: 97%  Weight: 136 lb (61.7 kg)  Height: 5\' 1"  (1.549 m)      Assessment & Plan:

## 2018-07-15 NOTE — Assessment & Plan Note (Signed)
Rx for doxycycline for the rhonchi on exam. She may have new COPD from smoking. Will check PFTs with return visit in 4-6 weeks from this flare. Also rx mucinex today to help her break up the mucus she feels is very thick. We talked about how stopping smoking would help the lungs work better and expectorate the sputum.

## 2018-07-20 ENCOUNTER — Encounter: Payer: Self-pay | Admitting: Internal Medicine

## 2018-07-21 MED ORDER — PREDNISONE 20 MG PO TABS: 40.0000 mg | ORAL_TABLET | Freq: Every day | ORAL | 0 refills | Status: DC

## 2018-07-21 NOTE — Addendum Note (Signed)
Addended by: Pricilla Holm A on: 07/21/2018 11:41 AM   Modules accepted: Orders

## 2018-08-03 ENCOUNTER — Other Ambulatory Visit: Payer: Self-pay | Admitting: Endocrinology

## 2018-08-03 MED ORDER — LEVOTHYROXINE SODIUM 137 MCG PO TABS: 137.0000 ug | ORAL_TABLET | Freq: Every day | ORAL | 0 refills | Status: DC

## 2018-09-09 DIAGNOSIS — Z961 Presence of intraocular lens: Secondary | ICD-10-CM | POA: Diagnosis not present

## 2018-09-09 DIAGNOSIS — H401131 Primary open-angle glaucoma, bilateral, mild stage: Secondary | ICD-10-CM | POA: Diagnosis not present

## 2018-09-10 ENCOUNTER — Other Ambulatory Visit: Payer: Self-pay | Admitting: *Deleted

## 2018-09-10 ENCOUNTER — Telehealth: Payer: Self-pay | Admitting: *Deleted

## 2018-09-10 DIAGNOSIS — R42 Dizziness and giddiness: Secondary | ICD-10-CM

## 2018-09-10 MED ORDER — MECLIZINE HCL 25 MG PO TABS
25.0000 mg | ORAL_TABLET | Freq: Three times a day (TID) | ORAL | 0 refills | Status: DC | PRN
Start: 2018-09-10 — End: 2018-10-25

## 2018-09-10 NOTE — Telephone Encounter (Signed)
Pt came by office today and stated that she was having another episode of vertigo with nausea and dizziness. Denied nystagmus. She stated this is just like what she has had before. She saw ENT and they recommended vestibular therapy. Pt reports she had this in 2017 and would like it again. Discussed that if she has any acute changes, stroke symptoms, get straight to ED. D/w Dr. Jaynee Eagles and we will schedule another f/u, order vestibular therapy and meclizine. Orders written per v.o. Pt very appreciative.

## 2018-09-10 NOTE — Telephone Encounter (Signed)
ERROR

## 2018-09-14 ENCOUNTER — Encounter: Payer: Medicare Other | Admitting: Internal Medicine

## 2018-09-16 ENCOUNTER — Ambulatory Visit: Payer: Medicare Other | Admitting: Cardiology

## 2018-09-16 ENCOUNTER — Telehealth: Payer: Self-pay | Admitting: *Deleted

## 2018-09-16 ENCOUNTER — Encounter: Payer: Self-pay | Admitting: Cardiology

## 2018-09-16 VITALS — BP 120/74 | HR 80 | Ht 61.0 in | Wt 135.8 lb

## 2018-09-16 DIAGNOSIS — R0789 Other chest pain: Secondary | ICD-10-CM | POA: Diagnosis not present

## 2018-09-16 DIAGNOSIS — I2584 Coronary atherosclerosis due to calcified coronary lesion: Secondary | ICD-10-CM

## 2018-09-16 DIAGNOSIS — R0683 Snoring: Secondary | ICD-10-CM

## 2018-09-16 DIAGNOSIS — I700 Atherosclerosis of aorta: Secondary | ICD-10-CM

## 2018-09-16 DIAGNOSIS — R0689 Other abnormalities of breathing: Secondary | ICD-10-CM

## 2018-09-16 DIAGNOSIS — I10 Essential (primary) hypertension: Secondary | ICD-10-CM | POA: Diagnosis not present

## 2018-09-16 DIAGNOSIS — I2510 Atherosclerotic heart disease of native coronary artery without angina pectoris: Secondary | ICD-10-CM

## 2018-09-16 NOTE — Patient Instructions (Signed)
Medication Instructions:  The current medical regimen is effective;  continue present plan and medications.  Testing/Procedures: Your physician has recommended that you have a sleep study. This test records several body functions during sleep, including: brain activity, eye movement, oxygen and carbon dioxide blood levels, heart rate and rhythm, breathing rate and rhythm, the flow of air through your mouth and nose, snoring, body muscle movements, and chest and belly movement.  Follow-Up: Follow up in 1 year with Dr. Marlou Porch.  You will receive a letter in the mail 2 months before you are due.  Please call us when you receive this letter to schedule your follow up appointment.  If you need a refill on your cardiac medications before your next appointment, please call your pharmacy.  Thank you for choosing [facility]!!

## 2018-09-16 NOTE — Progress Notes (Signed)
Cardiology Office Note    Date:  09/16/2018   ID:  [name], [name] 1946-08-07, MRN [id number]  PCP:  Hoyt Koch, MD  Cardiologist:   Candee Furbish, MD     History of Present Illness:  Rachel Gardner is a 72 y.o. female here for follow-up of coronary calcification, dilated aortic root, aortic atherosclerosis here for follow-up    She has seen Dr. Cyndia Bent, aortic root is currently 4 cm on most recent CT scan, was previously described as 4.4 however this may been a generous measurement.  Left lower lobe lung nodule that is stable at 7 mm. Extensive coronary calcification was seen on CT scan. Prior she had exertional tiredness and neck tightness but this has resolved. Nuclear stress test was reassuring in 2017. Had prior neck injury. BP was 160 at the time. Even her necklace was bothering her.   2013 had BPPV in [place]. Has a cochlear implant. Has had extensive neurologic workup as well as all reassuring.    On 04/19/12 - ECHO normal EF.  ETT 7:30 min non specific STT changes Nuclear stress 2017  Statin intol - Lipitor, crestor, pravastatin, zocor,  - muscle pain. LDL 115. Stopped taking Zetia, trying to eat- celery, juice.   Quit smoking 12/2015. Retired Marine scientist. She enjoyed traveling from [place] to [name], [place], [place] for her second birthday.  09/16/2018-chief complaint chest pain.  She has been experiencing chest pain in her breast/left-sided chest region. Sudden, takes a deep breath then goes away. When walking 2.32miles felt OK. Had vertigo before. Falling sensation. Heard heart beat left ear.  She snores. Wakes up gasping at times. Wakes up congestion. Tongue feels large in morning.     Past Medical History:  Diagnosis Date  . Allergy    hay fever  . Ascending aortic aneurysm ([place])   . Blood in stool   . BPPV (benign paroxysmal positional vertigo)   . Diverticulitis   . Fatty liver 05/28/10   per CT at [facility]  . Genital herpes   . GERD  (gastroesophageal reflux disease)   . Glaucoma   . Helicobacter pylori infection   . Hiatal hernia 05/28/10   CT @ [facility]  . Hyperlipidemia   . Hypertension   . Hypothyroidism   . 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 Kidney stones   . Lung nodule seen on imaging study   . Meniere's disease   . Obstructive sleep apnea     Past Surgical History:  Procedure Laterality Date  . ABDOMINAL HYSTERECTOMY  1995  . ANAL RECTAL MANOMETRY N/A 08/06/2016   Procedure: ANO RECTAL MANOMETRY;  Surgeon: Doran Stabler, MD;  Location: [facility];  Service: Gastroenterology;  Laterality: N/A;  . APPENDECTOMY  1989  . BPPV     x 4 after L cochlear implant  . BREAST SURGERY  1989   breast reduction  . CHOLECYSTECTOMY  1989  . COCHLEAR IMPLANT     Left/2009; Right/2012  . LAPAROSCOPIC SIGMOID COLECTOMY  2012   with takedown of splenic flexure; cystoscopy with bilateral ureteral stent placement  . REDUCTION MAMMAPLASTY Bilateral 1989   [place]  . TOTAL THYROIDECTOMY  1995    Outpatient Medications Prior to Visit  Medication Sig Dispense Refill  . ascorbic acid (VITAMIN C) 1000 MG tablet Take 500 mg by mouth daily.     Marland Kitchen aspirin 81 MG chewable tablet Chew 81 mg by mouth daily.     Marland Kitchen  b complex vitamins tablet Take 1 tablet by mouth daily.    . bimatoprost (LUMIGAN) 0.01 % SOLN Place 1 drop into both eyes at bedtime.    . Calcium Carb-Cholecalciferol (CALCIUM 500 + D3) 500-600 MG-UNIT TABS Take 1 Dose by mouth daily.     Marland Kitchen doxycycline (VIBRA-TABS) 100 MG tablet Take 1 tablet (100 mg total) by mouth 2 (two) times daily. 14 tablet 0  . levothyroxine (SYNTHROID, LEVOTHROID) 137 MCG tablet Take 1 tablet (137 mcg total) by mouth daily before breakfast. 90 tablet 0  . losartan-hydrochlorothiazide (HYZAAR) 100-25 MG per tablet Take 1 tablet by mouth daily.     . meclizine (ANTIVERT) 25 MG tablet Take 1 tablet (25 mg total) by mouth 3 (three) times daily as needed for dizziness. 30 tablet 0  .  Omega-3 Fatty Acids (FISH OIL PO) Take 1 tablet by mouth daily.    Marland Kitchen omeprazole (PRILOSEC) 40 MG capsule TAKE 1 CAPSULE(40 MG) BY MOUTH DAILY 30 capsule 0  . lactobacillus acidophilus (BACID) TABS tablet Take 1 tablet by mouth daily. 20 billions units per day    . nicotine polacrilex (COMMIT) 2 MG lozenge Take 2 mg by mouth as needed for smoking cessation.    . predniSONE (DELTASONE) 20 MG tablet Take 2 tablets (40 mg total) by mouth daily with breakfast. 10 tablet 0  . pseudoephedrine-guaifenesin (MUCINEX D) 60-600 MG 12 hr tablet Take 1 tablet by mouth every 12 (twelve) hours. 60 tablet 0   No facility-administered medications prior to visit.      Allergies:   Morphine and related; Statins; Tape; and Wellbutrin [bupropion]   Social History   Socioeconomic History  . Marital status: Divorced    Spouse name: Not on file  . Number of children: 3  . Years of education: Not on file  . Highest education level: Not on file  Occupational History  . Occupation: retired Marine scientist  Social Needs  . Financial resource strain: Not on file  . Food insecurity:    Worry: Not on file    Inability: Not on file  . Transportation needs:    Medical: Not on file    Non-medical: Not on file  Tobacco Use  . Smoking status: Current Every Day Smoker    Packs/day: 0.50    Years: 41.00    Pack years: 20.50    Types: Cigarettes    Last attempt to quit: 01/18/2016    Years since quitting: 2.6  . Smokeless tobacco: Never Used  Substance and Sexual Activity  . Alcohol use: Yes    Alcohol/week: 0.0 standard drinks    Comment: rarely  . Drug use: No  . Sexual activity: Never  Lifestyle  . Physical activity:    Days per week: Not on file    Minutes per session: Not on file  . Stress: Not on file  Relationships  . Social connections:    Talks on phone: Not on file    Gets together: Not on file    Attends religious service: Not on file    Active member of club or organization: Not on file    Attends  meetings of clubs or organizations: Not on file    Relationship status: Not on file  Other Topics Concern  . Not on file  Social History Narrative   Regular exercise: walk; 1 mile a day / walk 5 miles weekend;    Caffeine use: 1 cup of coffee in the AM      3  children        Family History:  The patient's family history includes Colon cancer in her cousin and maternal aunt; Hyperlipidemia in her brother; Hypertension in her father, mother, and sister; Prostate cancer in her brother and paternal uncle; Thyroid disease in her sister.   ROS:   Please see the history of present illness.    Review of Systems  All other systems reviewed and are negative.   PHYSICAL EXAM:   VS:  BP 120/74   Pulse 80   Ht 5\' 1"  (1.549 m)   Wt 135 lb 12.8 oz (61.6 kg)   SpO2 94%   BMI 25.66 kg/m    GEN: Well nourished, well developed, in no acute distress  HEENT: normal cochlear implants Neck: no JVD, carotid bruits, or masses Cardiac: RRR; no murmurs, rubs, or gallops,no edema  Respiratory:  clear to auscultation bilaterally, normal work of breathing GI: soft, nontender, nondistended, + BS MS: no deformity or atrophy  Skin: warm and dry, no rash Neuro:  Alert and Oriented x 3, Strength and sensation are intact Psych: euthymic mood, full affect    Wt Readings from Last 3 Encounters:  09/16/18 135 lb 12.8 oz (61.6 kg)  07/15/18 136 lb (61.7 kg)  04/26/18 138 lb (62.6 kg)      Studies/Labs Reviewed:   EKG:  EKG is ordered today.  09/16/2018-sinus rhythm, there is subtle T wave inversion in V2.  This is slightly different from prior EKG but fairly nonspecific.  Personally reviewed and interpreted.  05/05/17 shows sinus rhythm 85 with right atrial enlargement, borderline QT prolongation approximately 420 ms. Personally viewed. 03/19/16-sinus rhythm, 74, mild sinus arrhythmia otherwise unremarkable, personally viewed.  Recent Labs: 02/18/2018: ALT 17; BUN 23; Creatinine, Ser 0.97; Hemoglobin 14.6;  Platelets 242.0; Potassium 3.8; Sodium 138; TSH 0.52   Lipid Panel    Component Value Date/Time   CHOL 195 02/18/2018 0740   CHOL 222 (H) 12/16/2016 0826   TRIG 190.0 (H) 02/18/2018 0740   HDL 38.60 (L) 02/18/2018 0740   HDL 48 12/16/2016 0826   CHOLHDL 5 02/18/2018 0740   VLDL 38.0 02/18/2018 0740   LDLCALC 119 (H) 02/18/2018 0740   LDLCALC 138 (H) 12/16/2016 0826   LDLDIRECT 172.0 12/06/2013 0737    Additional studies/ records that were reviewed today include:  ECHO, ETT from 2013 reviewed. Normal.   Nuc 2017  Nuclear stress EF: 63%.  There was no ST segment deviation noted during stress.  The study is normal.  This is a low risk study.  The left ventricular ejection fraction is normal (55-65%).   ASSESSMENT:    1. Atypical chest pain   2. Essential hypertension   3. Aortic atherosclerosis ([place])   4. Snoring   5. Gasping for breath   6. Coronary artery calcification      PLAN:  In order of problems listed above:  Coronary artery calcification  - LAD distribution. Reassuring stress test on 03/25/16 with no ischemia.  Atypical chest pain -Likely musculoskeletal, short duration.  Recent stress test in 2017 was low risk.  If symptoms escalate, further testing may be warranted.  Former smoker -Congratulated her on quitting 12/2015.  This is excellent.  Continue with.  Aortic atherosclerosis -She is not interested in using Zetia or statin. She is trying to eat more fruits and vegetables. LDL has improved.  LDL 119.  Still would like her to be on a statin but she is not interested.  Hyperlipidemia -Mild previously, LDL 110.  Prior statin intolerances. Pravastatin, Crestor, Lipitor previously tried. Muscle pain. -Not interested in trying medication.  If she ever wishes to, we can always let her talk with the lipid referral clinic.  Ascending aortic dilation -4.4 cm previously measured oh (possibly generous) however repeat CT scan now shows 4.0 cm ascending  aortic aneurysm. Stable., followed by Dr. Cyndia Bent.  Seen previously on 01/20/2018.  Essential hypertension -No changes made, medications reviewed.  Overall doing very well.  BPPV  - Meclizine.  Feeling better.  Cochlear implant noted.     Medication Adjustments/Labs and Tests Ordered: Current medicines are reviewed at length with the patient today.  Concerns regarding medicines are outlined above.  Medication changes, Labs and Tests ordered today are listed in the Patient Instructions below. Patient Instructions  Medication Instructions:  The current medical regimen is effective;  continue present plan and medications.  Testing/Procedures: Your physician has recommended that you have a sleep study. This test records several body functions during sleep, including: brain activity, eye movement, oxygen and carbon dioxide blood levels, heart rate and rhythm, breathing rate and rhythm, the flow of air through your mouth and nose, snoring, body muscle movements, and chest and belly movement.  Follow-Up: Follow up in 1 year with Dr. Marlou Porch.  You will receive a letter in the mail 2 months before you are due.  Please call us when you receive this letter to schedule your follow up appointment.  If you need a refill on your cardiac medications before your next appointment, please call your pharmacy.  Thank you for choosing [facility]!!          Signed, Candee Furbish, MD  09/16/2018 10:43 AM    [facility] [address], [place], [place]  [address] Phone: [phone]; Fax: [phone]

## 2018-09-16 NOTE — Telephone Encounter (Signed)
-----   Message from Shellia Cleverly, RN sent at 09/16/2018 10:32 AM EDT ----- Regarding: sleep study Pt has been ordered to have split night sleep study for snoring and waking from sleep gasping for breath per Dr Marlou Porch  Thank you  Jeannene Patella

## 2018-09-17 ENCOUNTER — Ambulatory Visit: Payer: Medicare Other | Attending: Neurology | Admitting: Rehabilitative and Restorative Service Providers"

## 2018-09-17 ENCOUNTER — Encounter: Payer: Self-pay | Admitting: Rehabilitative and Restorative Service Providers"

## 2018-09-17 DIAGNOSIS — R42 Dizziness and giddiness: Secondary | ICD-10-CM | POA: Diagnosis not present

## 2018-09-17 NOTE — Therapy (Signed)
[facility] [address] [place], [place], [address] Phone: [phone]   Fax:  [phone]  Physical Therapy Evaluation  Patient Details  Name: Rachel Gardner MRN: [id number] Date of Birth: 1946-04-11 Referring Provider: Heide Spark, MD   Encounter Date: 09/17/2018  PT End of Session - 09/17/18 1612    Visit Number  1    Authorization Type  $40 copay blue medicare    PT Start Time  2119    PT Stop Time  1315    PT Time Calculation (min)  40 min    Activity Tolerance  Patient tolerated treatment well    Behavior During Therapy  [facility] for tasks assessed/performed       Past Medical History:  Diagnosis Date  . Allergy    hay fever  . Ascending aortic aneurysm ([place])   . Blood in stool   . BPPV (benign paroxysmal positional vertigo)   . Diverticulitis   . Fatty liver 05/28/10   per CT at [facility]  . Genital herpes   . GERD (gastroesophageal reflux disease)   . Glaucoma   . Helicobacter pylori infection   . Hiatal hernia 05/28/10   CT @ [facility]  . Hyperlipidemia   . Hypertension   . Hypothyroidism   . 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 Kidney stones   . Lung nodule seen on imaging study   . Meniere's disease   . Obstructive sleep apnea     Past Surgical History:  Procedure Laterality Date  . ABDOMINAL HYSTERECTOMY  1995  . ANAL RECTAL MANOMETRY N/A 08/06/2016   Procedure: ANO RECTAL MANOMETRY;  Surgeon: Doran Stabler, MD;  Location: [facility];  Service: Gastroenterology;  Laterality: N/A;  . APPENDECTOMY  1989  . BPPV     x 4 after L cochlear implant  . BREAST SURGERY  1989   breast reduction  . CHOLECYSTECTOMY  1989  . COCHLEAR IMPLANT     Left/2009; Right/2012  . LAPAROSCOPIC SIGMOID COLECTOMY  2012   with takedown of splenic flexure; cystoscopy with bilateral ureteral stent placement  . REDUCTION MAMMAPLASTY Bilateral 1989   [place]  . TOTAL THYROIDECTOMY  1995     There were no vitals filed for this visit.   Subjective Assessment - 09/17/18 1232    Subjective  The patient reports BPPV after L cochlear implant in 2010.  She has bilateral cochlear implants. She notes intermittent episodes of vertigo since 2010.  She reports h/o popping sensation in her ear prior to onset of symptoms.  On Tuesday 9/10, she felt a sensation of being pulled backwards and slept on 3 pillows.   The patient called neurologist and was prescribed meclizine and got referral for therapy.  She notes that she has been taught exercises, but is scared to do them.  She reports a "light" sensation of dizziness/ and unsteadiness.  She denies spinning sensation, denies HA, denies vision changes.  She notes symptoms worse when lowering her head (like picking up something from the floor).      Pertinent History  HTN, hypothyroidism, aortic aneurysm, meniere's disease, sleep apnea    Patient Stated Goals  She notes fullness in her ears, She thinks carrying weight (recently made a lot of curtains) made this come on.      Currently in Pain?  No/denies         [facility] PT Assessment - 09/17/18 1244      Assessment  Medical Diagnosis  vertigo    Referring Provider  Heide Spark, MD    Onset Date/Surgical Date  09/07/18    Prior Therapy  known to our cliic from prior evaluation      Precautions   Precautions  Fall      Restrictions   Weight Bearing Restrictions  No      Balance Screen   Has the patient fallen in the past 6 months  No    Has the patient had a decrease in activity level because of a fear of falling?   No    Is the patient reluctant to leave their home because of a fear of falling?   No      Home Environment   Living Environment  Private residence    Living Arrangements  Alone    Type of [place] to enter    Entrance Stairs-Number of Steps  2    [place]  One level      Prior Function   Level of Independence  Independent       Balance   Balance Assessed  Yes      Static Standing Balance   Static Standing - Balance Support  No upper extremity supported    Static Standing - Level of Assistance  7: Independent    Static Standing Balance -  Activities   Romberg - Eyes Opened;Romberg - Eyes Closed;Romberg - Eyes Opened, Foam;Romberg - Eyes Closed , Foam    Static Standing - Comment/# of Minutes  Able to perform EO/EC romberg on level and unlevel surfaces x 30 seconds.  Minimal sway with eyes closed.           Vestibular Assessment - 09/17/18 1249      Vestibular Assessment   General Observation  Wears glasses      Symptom Behavior   Type of Dizziness  Unsteady with head/body turns   worse when looking down   Frequency of Dizziness  varies    Duration of Dizziness  varies    Aggravating Factors  Activity in general;Forward bending    Relieving Factors  Lying supine      Occulomotor Exam   Occulomotor Alignment  Normal    Spontaneous  Absent    Gaze-induced  Absent    Smooth Pursuits  Intact    Saccades  Intact    Comment  The patient notes spells of vertigo appear to be stress related.  She notes "I'm scared to be dependent".  Feeling this type of dizziness makes her fearful that she will lose her independence.  She reports that she has a little "pounding on the left side" after oculomotor testing.      Vestibulo-Occular Reflex   VOR 1 Head Only (x 1 viewing)  slow pace- able to maintain fixation    Comment  Head impulse test=WNLs/ able to maintain fixation.      Positional Testing   Dix-Hallpike  Dix-Hallpike Right;Dix-Hallpike Left    Sidelying Test  Sidelying Right;Sidelying Left    Horizontal Canal Testing  Horizontal Canal Right;Horizontal Canal Left      Dix-Hallpike Right   Dix-Hallpike Right Duration  0    Dix-Hallpike Right Symptoms  No nystagmus      Dix-Hallpike Left   Dix-Hallpike Left Duration  0    Dix-Hallpike Left Symptoms  No nystagmus      Sidelying Right   Sidelying Right  Duration  0-notes some mild  nausea, and a sensation of stomach fullness/ then emptiness    Sidelying Right Symptoms  No nystagmus      Sidelying Left   Sidelying Left Duration  0- holds stomach upon returning to sitting    Sidelying Left Symptoms  No nystagmus      Horizontal Canal Right   Horizontal Canal Right Duration  0    Horizontal Canal Right Symptoms  Normal      Horizontal Canal Left   Horizontal Canal Left Duration  0    Horizontal Canal Left Symptoms  Normal      Orthostatics   BP supine (x 5 minutes)  138/88    HR supine (x 5 minutes)  76    BP standing (after 1 minute)  126/88    HR standing (after 1 minute)  88    BP standing (after 3 minutes)  118/80          Objective measurements completed on examination: See above findings.          Plan - 09/17/18 1613    Clinical Impression Statement  The patient has WNLs head impulse testing and positional testing.  She was able to perform eyes open/eyes closed standing on solid and compliant surfaces.  She notes that the episodes of dizziness typically come on with increased stress.  At this time, PT is not indicated.  Recommended she contact our clinic if any increase in symptoms in the next few weeks and we coudl reassess her.     Clinical Presentation  Stable    Clinical Decision Making  Low    PT Next Visit Plan  No scheduled follow-up; will call if any symptoms return.    Consulted and Agree with Plan of Care  Patient       Patient will benefit from skilled therapeutic intervention in order to improve the following deficits and impairments:  Dizziness  Visit Diagnosis: Dizziness and giddiness     Problem List Patient Active Problem List   Diagnosis Date Noted  . Left flank pain 04/26/2018  . Cough 12/31/2017  . GERD (gastroesophageal reflux disease) 06/04/2017  . Former smoker 03/19/2016  . Aortic atherosclerosis ([place]) 03/19/2016  . Dilated aortic root ([place]) 03/19/2016  . Routine general medical  examination at a health care facility 09/25/2015  . Thoracic ascending aortic aneurysm ([place])   . Lung nodule seen on imaging study   . Chronic midline posterior neck pain 08/04/2014  . Hematuria, microscopic 05/31/2014  . Vitamin D deficiency 05/31/2014  . Arthralgia 05/24/2014  . Fecal incontinence 05/24/2014  . Hyperlipidemia 10/03/2013  . Acquired hypothyroidism 08/16/2013  . Glaucoma 08/16/2013  . Allergic rhinitis 08/16/2013  . HTN (hypertension) 08/16/2013    Bexley Mclester, PT 09/17/2018, 4:46 PM  [facility] [address] [place], [place], [address] Phone: [phone]   Fax:  [phone]  Name: ALDEA AVIS MRN: [id number] Date of Birth: 1946-10-12

## 2018-09-21 ENCOUNTER — Telehealth: Payer: Self-pay | Admitting: *Deleted

## 2018-09-21 NOTE — Telephone Encounter (Signed)
Staff message sent to Nina per [employer] web portal no PA is required. Ok to schedule sleep study. 

## 2018-09-21 NOTE — Telephone Encounter (Addendum)
Patient is very Hard of Hearing,  Per dpr Son/Patient is scheduled for lab study on 10/22/18. Son/Patient understands her sleep study will be done at [facility] sleep lab. Son/Patient understands she will receive a sleep packet in a week or so. Son [place] understands to call if she does not receive the sleep packet in a timely manner.

## 2018-09-21 NOTE — Telephone Encounter (Signed)
-----   Message from Shellia Cleverly, RN sent at 09/16/2018 10:32 AM EDT ----- Regarding: sleep study Pt has been ordered to have split night sleep study for snoring and waking from sleep gasping for breath per Dr Marlou Porch  Thank you  Jeannene Patella

## 2018-10-11 ENCOUNTER — Ambulatory Visit (INDEPENDENT_AMBULATORY_CARE_PROVIDER_SITE_OTHER): Payer: Medicare Other | Admitting: Internal Medicine

## 2018-10-11 ENCOUNTER — Encounter: Payer: Self-pay | Admitting: Internal Medicine

## 2018-10-11 ENCOUNTER — Other Ambulatory Visit (INDEPENDENT_AMBULATORY_CARE_PROVIDER_SITE_OTHER): Payer: Medicare Other

## 2018-10-11 VITALS — BP 140/80 | HR 75 | Temp 97.9°F | Ht 61.0 in | Wt 137.0 lb

## 2018-10-11 DIAGNOSIS — E039 Hypothyroidism, unspecified: Secondary | ICD-10-CM | POA: Diagnosis not present

## 2018-10-11 DIAGNOSIS — Z0000 Encounter for general adult medical examination without abnormal findings: Secondary | ICD-10-CM | POA: Diagnosis not present

## 2018-10-11 DIAGNOSIS — E785 Hyperlipidemia, unspecified: Secondary | ICD-10-CM

## 2018-10-11 DIAGNOSIS — I10 Essential (primary) hypertension: Secondary | ICD-10-CM

## 2018-10-11 DIAGNOSIS — Z23 Encounter for immunization: Secondary | ICD-10-CM

## 2018-10-11 DIAGNOSIS — K219 Gastro-esophageal reflux disease without esophagitis: Secondary | ICD-10-CM

## 2018-10-11 LAB — COMPREHENSIVE METABOLIC PANEL
ALK PHOS: 87 U/L (ref 39–117)
ALT: 15 U/L (ref 0–35)
AST: 14 U/L (ref 0–37)
Albumin: 3.9 g/dL (ref 3.5–5.2)
BILIRUBIN TOTAL: 0.5 mg/dL (ref 0.2–1.2)
BUN: 18 mg/dL (ref 6–23)
CO2: 33 meq/L — AB (ref 19–32)
CREATININE: 0.79 mg/dL (ref 0.40–1.20)
Calcium: 9.6 mg/dL (ref 8.4–10.5)
Chloride: 103 mEq/L (ref 96–112)
GFR: 76.12 mL/min (ref >60.00)
GLUCOSE: 95 mg/dL (ref 70–99)
POTASSIUM: 3.5 meq/L (ref 3.5–5.1)
SODIUM: 142 meq/L (ref 135–145)
Total Protein: 7.3 g/dL (ref 6.0–8.3)

## 2018-10-11 LAB — LDL CHOLESTEROL, DIRECT: Direct LDL: 145 mg/dL

## 2018-10-11 LAB — CBC
HEMATOCRIT: 45.2 % (ref 36.0–46.0)
Hemoglobin: 15 g/dL (ref 12.0–15.0)
MCHC: 33.3 g/dL (ref 30.0–36.0)
MCV: 87 fl (ref 78.0–100.0)
Platelets: 198 10*3/uL (ref 150.0–400.0)
RBC: 5.19 Mil/uL — ABNORMAL HIGH (ref 3.87–5.11)
RDW: 12.7 % (ref 11.5–15.5)
WBC: 8.8 10*3/uL (ref 4.0–10.5)

## 2018-10-11 LAB — LIPID PANEL
CHOL/HDL RATIO: 5
CHOLESTEROL: 201 mg/dL — AB (ref 0–200)
HDL: 38.3 mg/dL — ABNORMAL LOW (ref >39.00)
NONHDL: 162.81
TRIGLYCERIDES: 275 mg/dL — AB (ref 0.0–149.0)
VLDL: 55 mg/dL — AB (ref 0.0–40.0)

## 2018-10-11 LAB — T4, FREE: Free T4: 0.97 ng/dL (ref 0.60–1.60)

## 2018-10-11 LAB — TSH: TSH: 1.81 u[IU]/mL (ref 0.35–4.50)

## 2018-10-11 NOTE — Progress Notes (Signed)
   Subjective:    Patient ID: Rachel Gardner, female    DOB: 10-20-1946, 72 y.o.   MRN: [id number]  HPI Here for medicare wellness and physical, no new complaints. Please see A/P for status and treatment of chronic medical problems.   Diet: heart healthy Physical activity: active Depression/mood screen: negative Hearing: moderate loss with bilateral aids Visual acuity: grossly normal, performs annual eye exam  ADLs: capable Fall risk: none Home safety: good Cognitive evaluation: intact to orientation, naming, recall and repetition EOL planning: adv directives discussed  I have personally reviewed and have noted 1. The patient's medical and social history - reviewed today no changes 2. Their use of alcohol, tobacco or illicit drugs 3. Their current medications and supplements 4. The patient's functional ability including ADL's, fall risks, home safety risks and hearing or visual impairment. 5. Diet and physical activities 6. Evidence for depression or mood disorders 7. Care team reviewed and updated (available in snapshot)  Review of Systems  Constitutional: Negative.   HENT: Negative.   Eyes: Negative.   Respiratory: Negative for cough, chest tightness and shortness of breath.   Cardiovascular: Negative for chest pain, palpitations and leg swelling.  Gastrointestinal: Negative for abdominal distention, abdominal pain, constipation, diarrhea, nausea and vomiting.       Mild leaking, improved overall  Genitourinary:       Stress incontinence  Musculoskeletal: Positive for arthralgias.  Skin: Negative.   Neurological: Negative.   Psychiatric/Behavioral: Negative.       Objective:   Physical Exam  Constitutional: She is oriented to person, place, and time. She appears well-developed and well-nourished.  HENT:  Head: Normocephalic and atraumatic.  Hard of hearing, aids in place  Eyes: EOM are normal.  Neck: Normal range of motion.  Cardiovascular: Normal rate and regular  rhythm.  Pulmonary/Chest: Effort normal and breath sounds normal. No respiratory distress. She has no wheezes. She has no rales.  Abdominal: Soft. Bowel sounds are normal. She exhibits no distension. There is no tenderness. There is no rebound.  Musculoskeletal: She exhibits no edema.  Neurological: She is alert and oriented to person, place, and time. Coordination normal.  Skin: Skin is warm and dry.  Psychiatric: She has a normal mood and affect.   Vitals:   10/11/18 0757  BP: 140/80  Pulse: 75  Temp: 97.9 F (36.6 C)  TempSrc: Oral  SpO2: 97%  Weight: 137 lb (62.1 kg)  Height: 5\' 1"  (1.549 m)      Assessment & Plan:  Flu shot given at visit

## 2018-10-11 NOTE — Patient Instructions (Signed)

## 2018-10-12 NOTE — Assessment & Plan Note (Signed)
Stable off meds currently.  °

## 2018-10-12 NOTE — Assessment & Plan Note (Signed)
Checking TSH and free T4 and adjust synthroid 137 mcg daily if needed.

## 2018-10-12 NOTE — Assessment & Plan Note (Signed)
Checking lipid panel and adjust fish oil if needed.

## 2018-10-12 NOTE — Assessment & Plan Note (Signed)
BP at goal on losartan/hctz 100/15 mg daily. Checking CMP and adjust as needed.

## 2018-10-12 NOTE — Assessment & Plan Note (Signed)
Flu shot given. Pneumonia complete. Shingrix counseled. Tetanus up to date. Colonoscopy up to date. Mammogram up to date, pap smear aged out and dexa up to date. Counseled about sun safety and mole surveillance. Counseled about the dangers of distracted driving. Given 10 year screening recommendations.  

## 2018-10-14 DIAGNOSIS — H02832 Dermatochalasis of right lower eyelid: Secondary | ICD-10-CM | POA: Diagnosis not present

## 2018-10-14 DIAGNOSIS — H02413 Mechanical ptosis of bilateral eyelids: Secondary | ICD-10-CM | POA: Diagnosis not present

## 2018-10-14 DIAGNOSIS — H02834 Dermatochalasis of left upper eyelid: Secondary | ICD-10-CM | POA: Diagnosis not present

## 2018-10-14 DIAGNOSIS — H02831 Dermatochalasis of right upper eyelid: Secondary | ICD-10-CM | POA: Diagnosis not present

## 2018-10-18 ENCOUNTER — Other Ambulatory Visit: Payer: Self-pay | Admitting: Internal Medicine

## 2018-10-18 DIAGNOSIS — Z1231 Encounter for screening mammogram for malignant neoplasm of breast: Secondary | ICD-10-CM

## 2018-10-18 NOTE — Telephone Encounter (Signed)
    Patient is very Hard of Hearing,  Per dpr Son/Patient is scheduled for lab study on 10/22/18. Son/Patient understands her sleep study will be done at [facility] sleep lab. Son/Patient understands she will receive a sleep packet in a week or so. Son [place] understands to call if she does not receive the sleep packet in a timely manner.

## 2018-10-19 DIAGNOSIS — H53483 Generalized contraction of visual field, bilateral: Secondary | ICD-10-CM | POA: Diagnosis not present

## 2018-10-22 ENCOUNTER — Ambulatory Visit (HOSPITAL_BASED_OUTPATIENT_CLINIC_OR_DEPARTMENT_OTHER): Payer: Medicare Other | Attending: Cardiology | Admitting: Cardiology

## 2018-10-22 DIAGNOSIS — G4733 Obstructive sleep apnea (adult) (pediatric): Secondary | ICD-10-CM | POA: Insufficient documentation

## 2018-10-22 DIAGNOSIS — R0683 Snoring: Secondary | ICD-10-CM

## 2018-10-22 DIAGNOSIS — R0689 Other abnormalities of breathing: Secondary | ICD-10-CM

## 2018-10-22 DIAGNOSIS — Z79899 Other long term (current) drug therapy: Secondary | ICD-10-CM | POA: Diagnosis not present

## 2018-10-25 ENCOUNTER — Ambulatory Visit: Payer: Medicare Other | Admitting: Neurology

## 2018-10-25 ENCOUNTER — Encounter: Payer: Self-pay | Admitting: Neurology

## 2018-10-25 VITALS — BP 131/84 | HR 86 | Ht 61.0 in | Wt 134.0 lb

## 2018-10-25 DIAGNOSIS — H8110 Benign paroxysmal vertigo, unspecified ear: Secondary | ICD-10-CM | POA: Diagnosis not present

## 2018-10-25 DIAGNOSIS — R42 Dizziness and giddiness: Secondary | ICD-10-CM | POA: Diagnosis not present

## 2018-10-25 MED ORDER — MECLIZINE HCL 25 MG PO TABS: 25.0000 mg | ORAL_TABLET | Freq: Three times a day (TID) | ORAL | 4 refills | Status: DC | PRN

## 2018-10-25 NOTE — Progress Notes (Signed)
[facility]    Provider:  Dr Jaynee Eagles Referring Provider: Dr. Thornell Mule, ENT Primary Care Physician:  Hoyt Koch, MD  CC:  Sudden onset Dizziness and Nausea  Interval history 10/25/2018:  lovely 72 y.o. female here as a referral from Dr. Thornell Mule  for dizziness, imbalance. She has a past medical history of hypertension, high cholesterol, glaucoma, cataracts, sleep apnea, cochlear implant, bilateral sensorineural hearing loss, tinnitus bilateral, vertiginous syndrome, hypothyroidism, BPPV. She was last seen in 2017 and was under extreme stress which was thought to be contributary, imaging was ordered and she was referred to vestibular therapy. Last month had an episode of vertigo in the middle of the night, the world circled her. She had nausea and dizziness. She could walk. She perfomed the Epley maneuvers with good results. No headache or migraines. A few days later she discovered the position that worsens the symptoms when moving around the fabric. She is completely resolved. Meclizine helped. She is completely better.   CTA head and neck 2017:  1. Calcified aortic atherosclerosis. See Chest CTA reported separately today. 2. Soft and calcified bilateral carotid artery atherosclerosis but no associated stenosis. 3. 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 The dominant right vertebral artery is patent without stenosis despite some calcified plaque, and supplies the basilar. 4. The posterior circulation otherwise is negative. There is a fetal type origin of the left PCA. 5. Negative CT appearance of the brain. Previous bilateral cochlear implants. Mild chronic right mastoid effusion.  HPI:  TAYLLOR BREITENSTEIN is a lovely 72 y.o. female here as a referral from Dr. Thornell Mule  for dizziness, imbalance. She has a past medical history of  hypertension, high cholesterol, glaucoma, cataracts, sleep apnea, cochlear implant, bilateral sensorineural hearing loss, tinnitus bilateral, vertiginous syndrome, hypothyroidism, BPPV. She is currently under a lot stress. Patient and son have intense conversation today, she cries and admits she is depressed and she is smoking again. Patient was in yoga class  6 weeks ago, her head was below the body and she became dizzy and nauseated. She has a history of bppv. She has tinnitus happens 1-2x a day and lasts a few seconds, the dizziness only started after the yoga class, she describes the dizziness like she is "floating" she loses her balance, like she "misses air", like her body is moving, lasts seconds but happens every day and more associated with getting up quickly or quick movements. She lost her balance when she got up quickly and hit her toe on the corner of her shelf but no fall. Sometime she is taking a shower and if drop something difficult for her to pick it up and difficult to get up. Son is here and provides information. She has not "worked out" in years, she is very complacent, yoga was her attempt to restart physical activity and unfortunately she became dizzy.  She feels nauseated when she bends over, when she moves her head to the right she feels nauseated. The tinnitus started at yoga too. Also balance loss but she attributed it to age and disuse, when she gets up quickly she staggers or when turns her head too quickly. She has two dogs and when she bends down to pick up the dog's food and when she gets up she needs to hold onto somethine. She c/o chronic double vision after cataract removal. She smokes currently. Son is  here and endorses her smoking. She is under a lot of stress, no headaches or migraines, no other associated symptoms or modifying factors.  Reviewed notes, labs and imaging from outside physicians, which showed:  Personally reviewed imaging and agree with the following CT head  2015: FINDINGS: There is artifact bilaterally from cochlear implants. Study is therefore somewhat less than optimal due to susceptibility artifact from the cochlear implants. The ventricles are normal in size and configuration. There is no demonstrable mass, hemorrhage, extra-axial fluid collection, or midline shift. No focal gray-white compartments are appreciable. No acute infarct is evident. The bony calvarium appears intact. There is opacification of several inferior mastoids on the right. Mastoids elsewhere clear. As noted above, cochlear implants are present bilaterally.  IMPRESSION: Study limited due to cochlear implants bilaterally with associated magnetic susceptibility artifact. Allowing for areas of limited visualization due to the artifact from the cochlear implants, no focal intracranial lesion is appreciable. No mass, hemorrhage, or extra-axial fluid collection is appreciable in particular. Note that there is opacification of multiple inferior mastoid air cells on the right.  Review of Systems: Patient complains of symptoms per HPI as well as the following symptoms: Double vision, snoring, incontinence, hearing loss, ringing in ears, cramps, dizziness, insomnia, snoring, disinterest in activities. Pertinent negatives per HPI. All others negative.   Social History   Socioeconomic History  . Marital status: Divorced    Spouse name: Not on file  . Number of children: 3  . Years of education: Not on file  . Highest education level: Not on file  Occupational History  . Occupation: retired Marine scientist  Social Needs  . Financial resource strain: Not on file  . Food insecurity:    Worry: Not on file    Inability: Not on file  . Transportation needs:    Medical: Not on file    Non-medical: Not on file  Tobacco Use  . Smoking status: Current Every Day Smoker    Packs/day: 0.50    Years: 41.00    Pack years: 20.50    Types: Cigarettes    Last attempt to quit: 01/18/2016      Years since quitting: 2.7  . Smokeless tobacco: Never Used  Substance and Sexual Activity  . Alcohol use: Yes    Alcohol/week: 0.0 standard drinks    Comment: rarely  . Drug use: No  . Sexual activity: Never  Lifestyle  . Physical activity:    Days per week: Not on file    Minutes per session: Not on file  . Stress: Not on file  Relationships  . Social connections:    Talks on phone: Not on file    Gets together: Not on file    Attends religious service: Not on file    Active member of club or organization: Not on file    Attends meetings of clubs or organizations: Not on file    Relationship status: Not on file  . Intimate partner violence:    Fear of current or ex partner: Not on file    Emotionally abused: Not on file    Physically abused: Not on file    Forced sexual activity: Not on file  Other Topics Concern  . Not on file  Social History Narrative   Regular exercise: walk; 1 mile a day / walk 5 miles weekend;    Caffeine use: 1 cup of coffee in the AM      3 children       Family History  Problem Relation Age of Onset  . Hypertension Mother   . Hypertension Father   . Hypertension Sister   . Prostate cancer Brother   . Hyperlipidemia Brother   . Colon cancer Maternal Aunt   . Prostate cancer Paternal Uncle   . Colon cancer Cousin        x 2; paternal  . Thyroid disease Sister     Past Medical History:  Diagnosis Date  . Allergy    hay fever  . Ascending aortic aneurysm ([place])   . Blood in stool   . BPPV (benign paroxysmal positional vertigo)   . Diverticulitis   . Fatty liver 05/28/10   per CT at [facility]  . Genital herpes   . GERD (gastroesophageal reflux disease)   . Glaucoma   . Helicobacter pylori infection   . Hiatal hernia 05/28/10   CT @ [facility]  . Hyperlipidemia   . Hypertension   . Hypothyroidism   . 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 Kidney stones   . Lung nodule seen on imaging study   . Meniere's  disease   . Obstructive sleep apnea     Past Surgical History:  Procedure Laterality Date  . ABDOMINAL HYSTERECTOMY  1995  . ANAL RECTAL MANOMETRY N/A 08/06/2016   Procedure: ANO RECTAL MANOMETRY;  Surgeon: Doran Stabler, MD;  Location: [facility];  Service: Gastroenterology;  Laterality: N/A;  . APPENDECTOMY  1989  . BPPV     x 4 after L cochlear implant  . BREAST SURGERY  1989   breast reduction  . CHOLECYSTECTOMY  1989  . COCHLEAR IMPLANT     Left/2009; Right/2012  . LAPAROSCOPIC SIGMOID COLECTOMY  2012   with takedown of splenic flexure; cystoscopy with bilateral ureteral stent placement  . REDUCTION MAMMAPLASTY Bilateral 1989   [place]  . TOTAL THYROIDECTOMY  1995    Current Outpatient Medications  Medication Sig Dispense Refill  . ascorbic acid (VITAMIN C) 1000 MG tablet Take 500 mg by mouth daily.     Marland Kitchen aspirin 81 MG chewable tablet Chew 81 mg by mouth daily.     . bimatoprost (LUMIGAN) 0.01 % SOLN Place 1 drop into both eyes at bedtime.    . Calcium Carb-Cholecalciferol (CALCIUM 500 + D3) 500-600 MG-UNIT TABS Take 1 Dose by mouth daily.     Marland Kitchen levothyroxine (SYNTHROID, LEVOTHROID) 137 MCG tablet Take 1 tablet (137 mcg total) by mouth daily before breakfast. 90 tablet 0  . losartan-hydrochlorothiazide (HYZAAR) 100-25 MG per tablet Take 1 tablet by mouth daily.     . meclizine (ANTIVERT) 25 MG tablet Take 1 tablet (25 mg total) by mouth 3 (three) times daily as needed for dizziness. 90 tablet 4  . Omega-3 Fatty Acids (FISH OIL PO) Take 1 tablet by mouth daily.     No current facility-administered medications for this visit.     Allergies as of 10/25/2018 - Review Complete 10/25/2018  Allergen Reaction Noted  . Morphine and related  05/24/2014  . Statins Other (See Comments) 08/16/2013  . Tape Other (See Comments) 04/25/2015  . Wellbutrin [bupropion] Other (See Comments) 02/13/2015    Vitals: BP 131/84   Pulse 86   Ht 5\' 1"  (1.549 m)   Wt 134 lb (60.8 kg)    BMI 25.32 kg/m  Last Weight:  Wt Readings from Last 1 Encounters:  10/25/18 134 lb (60.8 kg)   Last Height:   Ht Readings from Last 1 Encounters:  10/25/18 5\' 1"  (1.549 m)   Physical exam: Exam: Gen: NAD, conversant, well nourised, obese, well groomed                     CV: RRR, no MRG. No Carotid Bruits. No peripheral edema, warm, nontender Eyes: Conjunctivae clear without exudates or hemorrhage  Neuro: Detailed Neurologic Exam  Speech:    Speech is normal; fluent and spontaneous with normal comprehension.  Cognition:    The patient is oriented to person, place, and time;     recent and remote memory intact;     language fluent;     normal attention, concentration,     fund of knowledge Cranial Nerves:    The pupils are equal, round, and reactive to light. Attempted funduscopic exam could not visualize. Visual fields are full to finger confrontation. Extraocular movements are intact. Trigeminal sensation is intact and the muscles of mastication are normal. The face is symmetric. The palate elevates in the midline. Hearing intact. Voice is normal. Shoulder shrug is normal. The tongue has normal motion without fasciculations.   Coordination:    Normal finger to nose and heel to shin.  Gait:    Heel-toe and tandem gait are normal.   Motor Observation:    No asymmetry, no atrophy, and no involuntary movements noted. Tone:    Normal muscle tone.    Posture:    Posture is normal. normal erect    Strength:    Strength is V/V in the upper and lower limbs.      Sensation: intact to LT, pin prick, temperature, vibration, proprioception, neg Romberg     Reflex Exam:  DTR's:    Deep tendon reflexes in the upper and lower extremities are normal bilaterally.   Toes:    The toes are downgoing bilaterally.   Clonus:    Clonus is absent.       Assessment/Plan:    SONIYA ASHRAF is a 72 y.o., today returns to clinic. Had an episode of vertigo last month, BPPV. Epley  maneuvers helped. She has been symptom free since then. Refilled Meclizine. She is significantly improved since 2017. No focal neurologic deficits.  Prior A/P 2017:  Assessment/Plan:    LAQUANDRA CARRILLO is a 72 y.o. female here as a referral from Dr. Thornell Mule last seen in 2017 for dizziness, imbalance.She was under a lot stress. Patient and son had intense conversation last appointment, she cried in the office and admits she is depressed and she admitted smoking again. Patient was in yoga class  6 weeks prior, her head was below the body and she became dizzy and nauseated. Neurologic exam is nonfocal. CTA of the head and neck were ordered and she was referred to vestibular therapy in 2017 and 2019 and she has been successful at performing the Epley maneuvers.   Depression, stress: She is crying in the office today, admits she is depressed and stressed. Discussed with son and patient that she should seek therapy and discuss with primary care possible medication treatment for her depression such as an SSRI.   Vascular risk factors for stroke: She should stop smoking. She should take a daily baby aspirin for stroke prevention. She needs to follow with primary care for management of vascular risk factors for stroke such as smoking cessation, cholesterol and blood pressure management, monitoring for diabetes. Patient has a history of sleep apnea, she should follow with her sleep doctor in the past or we can discuss a new  study in the future if needed.  Dizziness, imbalance: We'll order physical therapy for both safety and gait, balance and vestibular exercises. Discussed regular exercise and looking into Silver Sneakers. Will order CT head.  CTA of the head and neck to evaluate for intracranial stenosis or carotid stenosis or other intracranial or vascular etiologies for her dizziness, postural dizziness with pre-syncope, lightheadedness. Will check labs today.   Orthostatic vital signs are normal in the office  today. Discussed hydration and good diet.   Patient will follow-up with me after physical therapy.  Cc: Dr. Thornell Mule, Dr. Laney Potash, MD  [facility] [address] [place], [place] [address]  Phone [phone] Fax [phone]  A total of 15  minutes was spent face-to-face with this patient. Over half this time was spent on counseling patient on the  1. Vertigo   2. Benign paroxysmal positional vertigo, unspecified laterality     diagnosis and different diagnostic and therapeutic options, counseling and coordination of care, risks ans benefits of management, compliance, or risk factor reduction and education.

## 2018-10-25 NOTE — Patient Instructions (Signed)
Benign Positional Vertigo Vertigo is the feeling that you or your surroundings are moving when they are not. Benign positional vertigo is the most common form of vertigo. The cause of this condition is not serious (is benign). This condition is triggered by certain movements and positions (is positional). This condition can be dangerous if it occurs while you are doing something that could endanger you or others, such as driving. What are the causes? In many cases, the cause of this condition is not known. It may be caused by a disturbance in an area of the inner ear that helps your brain to sense movement and balance. This disturbance can be caused by a viral infection (labyrinthitis), head injury, or repetitive motion. What increases the risk? This condition is more likely to develop in:  Women.  People who are 50 years of age or older.  What are the signs or symptoms? Symptoms of this condition usually happen when you move your head or your eyes in different directions. Symptoms may start suddenly, and they usually last for less than a minute. Symptoms may include:  Loss of balance and falling.  Feeling like you are spinning or moving.  Feeling like your surroundings are spinning or moving.  Nausea and vomiting.  Blurred vision.  Dizziness.  Involuntary eye movement (nystagmus).  Symptoms can be mild and cause only slight annoyance, or they can be severe and interfere with daily life. Episodes of benign positional vertigo may return (recur) over time, and they may be triggered by certain movements. Symptoms may improve over time. How is this diagnosed? This condition is usually diagnosed by medical history and a physical exam of the head, neck, and ears. You may be referred to a health care provider who specializes in ear, nose, and throat (ENT) problems (otolaryngologist) or a provider who specializes in disorders of the nervous system (neurologist). You may have additional testing,  including:  MRI.  A CT scan.  Eye movement tests. Your health care provider may ask you to change positions quickly while he or she watches you for symptoms of benign positional vertigo, such as nystagmus. Eye movement may be tested with an electronystagmogram (ENG), caloric stimulation, the Dix-Hallpike test, or the roll test.  An electroencephalogram (EEG). This records electrical activity in your brain.  Hearing tests.  How is this treated? Usually, your health care provider will treat this by moving your head in specific positions to adjust your inner ear back to normal. Surgery may be needed in severe cases, but this is rare. In some cases, benign positional vertigo may resolve on its own in 2-4 weeks. Follow these instructions at home: Safety  Move slowly.Avoid sudden body or head movements.  Avoid driving.  Avoid operating heavy machinery.  Avoid doing any tasks that would be dangerous to you or others if a vertigo episode would occur.  If you have trouble walking or keeping your balance, try using a cane for stability. If you feel dizzy or unstable, sit down right away.  Return to your normal activities as told by your health care provider. Ask your health care provider what activities are safe for you. General instructions  Take over-the-counter and prescription medicines only as told by your health care provider.  Avoid certain positions or movements as told by your health care provider.  Drink enough fluid to keep your urine clear or pale yellow.  Keep all follow-up visits as told by your health care provider. This is important. Contact a health care   provider if:  You have a fever.  Your condition gets worse or you develop new symptoms.  Your family or friends notice any behavioral changes.  Your nausea or vomiting gets worse.  You have numbness or a "pins and needles" sensation. Get help right away if:  You have difficulty speaking or moving.  You are  always dizzy.  You faint.  You develop severe headaches.  You have weakness in your legs or arms.  You have changes in your hearing or vision.  You develop a stiff neck.  You develop sensitivity to light. This information is not intended to replace advice given to you by your health care provider. Make sure you discuss any questions you have with your health care provider. Document Released: 09/22/2006 Document Revised: 05/22/2016 Document Reviewed: 04/09/2015 Elsevier Interactive Patient Education  2018 [place] tablets or capsules What is this medicine? MECLIZINE (MEK li zeen) is an antihistamine. It is used to prevent nausea, vomiting, or dizziness caused by motion sickness. It is also used to prevent and treat vertigo (extreme dizziness or a feeling that you or your surroundings are tilting or spinning around). This medicine may be used for other purposes; ask your health care provider or pharmacist if you have questions. COMMON BRAND NAME(S): Antivert, Dramamine Less Drowsy, Medivert, Meni-D What should I tell my health care provider before I take this medicine? They need to know if you have any of these conditions: -glaucoma -lung or breathing disease, like asthma -problems urinating -prostate disease -stomach or intestine problems -an unusual or allergic reaction to meclizine, other medicines, foods, dyes, or preservatives -pregnant or trying to get pregnant -breast-feeding How should I use this medicine? Take this medicine by mouth with a glass of water. Follow the directions on the prescription label. If you are using this medicine to prevent motion sickness, take the dose at least 1 hour before travel. If it upsets your stomach, take it with food or milk. Take your doses at regular intervals. Do not take your medicine more often than directed. Talk to your pediatrician regarding the use of this medicine in children. Special care may be  needed. Overdosage: If you think you have taken too much of this medicine contact a poison control center or emergency room at once. NOTE: This medicine is only for you. Do not share this medicine with others. What if I miss a dose? If you miss a dose, take it as soon as you can. If it is almost time for your next dose, take only that dose. Do not take double or extra doses. What may interact with this medicine? Do not take this medicine with any of the following medications: -MAOIs like Carbex, Eldepryl, Marplan, Nardil, and Parnate This medicine may also interact with the following medications: -alcohol -antihistamines for allergy, cough and cold -certain medicines for anxiety or sleep -certain medicines for depression, like amitriptyline, fluoxetine, sertraline -certain medicines for seizures like phenobarbital, primidone -general anesthetics like halothane, isoflurane, methoxyflurane, propofol -local anesthetics like lidocaine, pramoxine, tetracaine -medicines that relax muscles for surgery -narcotic medicines for pain -phenothiazines like chlorpromazine, mesoridazine, prochlorperazine, thioridazine This list may not describe all possible interactions. Give your health care provider a list of all the medicines, herbs, non-prescription drugs, or dietary supplements you use. Also tell them if you smoke, drink alcohol, or use illegal drugs. Some items may interact with your medicine. What should I watch for while using this medicine? Tell your doctor or healthcare professional if your  symptoms do not start to get better or if they get worse. You may get drowsy or dizzy. Do not drive, use machinery, or do anything that needs mental alertness until you know how this medicine affects you. Do not stand or sit up quickly, especially if you are an older patient. This reduces the risk of dizzy or fainting spells. Alcohol may interfere with the effect of this medicine. Avoid alcoholic drinks. Your  mouth may get dry. Chewing sugarless gum or sucking hard candy, and drinking plenty of water may help. Contact your doctor if the problem does not go away or is severe. This medicine may cause dry eyes and blurred vision. If you wear contact lenses you may feel some discomfort. Lubricating drops may help. See your eye doctor if the problem does not go away or is severe. What side effects may I notice from receiving this medicine? Side effects that you should report to your doctor or health care professional as soon as possible: -feeling faint or lightheaded, falls -fast, irregular heartbeat Side effects that usually do not require medical attention (report to your doctor or health care professional if they continue or are bothersome): -constipation -headache -trouble passing urine or change in the amount of urine -trouble sleeping -upset stomach This list may not describe all possible side effects. Call your doctor for medical advice about side effects. You may report side effects to FDA at 1-800-FDA-1088. Where should I keep my medicine? Keep out of the reach of children. Store at room temperature between 15 and 30 degrees C (59 and 86 degrees F). Keep container tightly closed. Throw away any unused medicine after the expiration date. NOTE: This sheet is a summary. It may not cover all possible information. If you have questions about this medicine, talk to your doctor, pharmacist, or health care provider.  2018 Elsevier/Gold Standard (2016-01-16 19:41:02)

## 2018-10-25 NOTE — Procedures (Signed)
Patient Name: Rachel Gardner, Rachel Gardner Date: 10/22/2018 Gender: Female D.O.B: [birth year] Age (years): 28 Referring Provider: Jerline Pain Height (inches): 61 Interpreting Physician: Fransico Him MD, ABSM Weight (lbs): 137 RPSGT: Earney [place] BMI: 26 MRN: [id number] Neck Size: 14.50  CLINICAL INFORMATION Sleep Study Type: Split Night CPAP  Indication for sleep study: Snoring  Epworth Sleepiness Score: 15  SLEEP STUDY TECHNIQUE As per the AASM Manual for the Scoring of Sleep and Associated Events v2.3 (April 2016) with a hypopnea requiring 4% desaturations.  The channels recorded and monitored were frontal, central and occipital EEG, electrooculogram (EOG), submentalis EMG (chin), nasal and oral airflow, thoracic and abdominal wall motion, anterior tibialis EMG, snore microphone, electrocardiogram, and pulse oximetry. Continuous positive airway pressure (CPAP) was initiated when the patient met split night criteria and was titrated according to treat sleep-disordered breathing.  MEDICATIONS Medications self-administered by patient taken the night of the study : LUMIGAN  RESPIRATORY PARAMETERS Diagnostic Total AHI (/hr): 20.5  RDI (/hr):28.0  OA Index (/hr): 5.6  CA Index (/hr): 0.0 REM AHI (/hr): 28.6  NREM AHI (/hr):16.6  Supine AHI (/hr):41.1  Non-supine AHI (/hr):15.2 Min O2 Sat (%):80.0  Mean O2 (%): 92.5  Time below 88% (min):3.1   Titration Optimal Pressure (cm):11  AHI at Optimal Pressure (/hr):0.0  Min O2 at Optimal Pressure (%):93.0 Supine % at Optimal (%):2  Sleep % at Optimal (%):64   SLEEP ARCHITECTURE The recording time for the entire night was 364.6 minutes.  During a baseline period of 131.1 minutes, the patient slept for 128.7 minutes in REM and nonREM, yielding a sleep efficiency of 98.2%%. Sleep onset after lights out was 0.4 minutes with a REM latency of 38.0 minutes. The patient spent 1.6%% of the night in stage N1 sleep, 65.8%% in stage N2  sleep, 0.0%% in stage N3 and 32.6% in REM.  During the titration period of 231.0 minutes, the patient slept for 188.7 minutes in REM and nonREM, yielding a sleep efficiency of 81.7%%. Sleep onset after CPAP initiation was 4.3 minutes with a REM latency of 55.0 minutes. The patient spent 1.6%% of the night in stage N1 sleep, 71.6%% in stage N2 sleep, 0.0%% in stage N3 and 26.8% in REM.  CARDIAC DATA The 2 lead EKG demonstrated sinus rhythm. The mean heart rate was 100.0 beats per minute. Other EKG findings include: None.  LEG MOVEMENT DATA The total Periodic Limb Movements of Sleep (PLMS) were 0. The PLMS index was 0.0 .  IMPRESSIONS - Moderate obstructive sleep apnea occurred during the diagnostic portion of the study(AHI = 20.5/hour). An optimal PAP pressure was selected for this patient ( 11 cm of water) - No significant central sleep apnea occurred during the diagnostic portion of the study (CAI = 0.0/hour). - Oxygen desaturation were noted during the diagnostic portion of the study (Min O2 = 80.0%) - The patient snored with moderate snoring volume during the diagnostic portion of the study. - No cardiac abnormalities were noted during this study. - Clinically significant periodic limb movements did not occur during sleep.  DIAGNOSIS - Obstructive Sleep Apnea (327.23 [G47.33 ICD-10])  RECOMMENDATIONS - Trial of CPAP therapy on 11 cm H2O with a Small size Fisher&Paykel Full Face Mask Simplus mask and heated humidification. - Avoid alcohol, sedatives and other CNS depressants that may worsen sleep apnea and disrupt normal sleep architecture. - Sleep hygiene should be reviewed to assess factors that may improve sleep quality. - Weight management and regular exercise should be initiated or continued. -  Return to Sleep Center for re-evaluation after 10 weeks of therapy  [Electronically signed] 10/25/2018 10:56 PM  Fransico Him MD, ABSM Diplomate, American Board of Sleep Medicine

## 2018-10-28 ENCOUNTER — Telehealth: Payer: Self-pay | Admitting: *Deleted

## 2018-10-28 NOTE — Telephone Encounter (Signed)
Informed patient of sleep study results and patient understanding was verbalized. Patient understands her sleep study showed they have significant sleep apnea and had successful PAP titration and will be set up with PAP unit. Patient understands order are in EPIC.   Upon patient request DME selection is [facility]. Patient understands she will be contacted by [facility] to set up her cpap. Patient understands to call if [facility] does not contact her with new setup in a timely manner. Patient understands they will be called once confirmation has been received from [facility] that they have received their new machine to schedule 10 week follow up appointment.  [facility] notified of new cpap order  Please add to airview Patient was grateful for the call and thanked me.

## 2018-10-28 NOTE — Telephone Encounter (Signed)
-----   Message from Sueanne Margarita, MD sent at 10/25/2018 11:00 PM EDT ----- Please let patient know that they have significant sleep apnea and had successful PAP titration and will be set up with PAP unit.  Please let DME know that order is in EPIC.  Please set patient up for OV in 10 weeks

## 2018-11-01 ENCOUNTER — Other Ambulatory Visit: Payer: Self-pay | Admitting: Endocrinology

## 2018-11-09 DIAGNOSIS — J310 Chronic rhinitis: Secondary | ICD-10-CM | POA: Diagnosis not present

## 2018-11-09 DIAGNOSIS — J342 Deviated nasal septum: Secondary | ICD-10-CM | POA: Diagnosis not present

## 2018-11-09 DIAGNOSIS — J343 Hypertrophy of nasal turbinates: Secondary | ICD-10-CM | POA: Diagnosis not present

## 2018-11-24 ENCOUNTER — Ambulatory Visit
Admission: RE | Admit: 2018-11-24 | Discharge: 2018-11-24 | Disposition: A | Discharge status: Home / Self Care | Payer: Medicare Other | Source: Ambulatory Visit | Attending: Internal Medicine | Admitting: Internal Medicine

## 2018-11-24 DIAGNOSIS — Z1231 Encounter for screening mammogram for malignant neoplasm of breast: Secondary | ICD-10-CM

## 2018-11-29 DIAGNOSIS — H02834 Dermatochalasis of left upper eyelid: Secondary | ICD-10-CM | POA: Diagnosis not present

## 2018-11-29 DIAGNOSIS — H02832 Dermatochalasis of right lower eyelid: Secondary | ICD-10-CM | POA: Diagnosis not present

## 2018-11-29 DIAGNOSIS — H02831 Dermatochalasis of right upper eyelid: Secondary | ICD-10-CM | POA: Diagnosis not present

## 2018-11-29 DIAGNOSIS — H02413 Mechanical ptosis of bilateral eyelids: Secondary | ICD-10-CM | POA: Diagnosis not present

## 2018-11-30 ENCOUNTER — Ambulatory Visit: Payer: Medicare Other | Admitting: Family

## 2018-11-30 ENCOUNTER — Other Ambulatory Visit: Payer: Self-pay | Admitting: Family

## 2018-11-30 ENCOUNTER — Other Ambulatory Visit (INDEPENDENT_AMBULATORY_CARE_PROVIDER_SITE_OTHER): Payer: Medicare Other

## 2018-11-30 ENCOUNTER — Encounter: Payer: Self-pay | Admitting: Family

## 2018-11-30 VITALS — BP 124/72 | HR 83 | Temp 98.2°F | Ht 61.0 in | Wt 135.0 lb

## 2018-11-30 DIAGNOSIS — R109 Unspecified abdominal pain: Secondary | ICD-10-CM

## 2018-11-30 LAB — CBC WITH DIFFERENTIAL/PLATELET
BASOS ABS: 0 10*3/uL (ref 0.0–0.1)
Basophils Relative: 0.4 % (ref 0.0–3.0)
Eosinophils Absolute: 0.2 10*3/uL (ref 0.0–0.7)
Eosinophils Relative: 2.1 % (ref 0.0–5.0)
HEMATOCRIT: 44.5 % (ref 36.0–46.0)
Hemoglobin: 15.1 g/dL — ABNORMAL HIGH (ref 12.0–15.0)
LYMPHS PCT: 37.8 % (ref 12.0–46.0)
Lymphs Abs: 4.1 10*3/uL — ABNORMAL HIGH (ref 0.7–4.0)
MCHC: 34 g/dL (ref 30.0–36.0)
MCV: 86.6 fl (ref 78.0–100.0)
Monocytes Absolute: 0.9 10*3/uL (ref 0.1–1.0)
Monocytes Relative: 8.8 % (ref 3.0–12.0)
Neutro Abs: 5.5 10*3/uL (ref 1.4–7.7)
Neutrophils Relative %: 50.9 % (ref 43.0–77.0)
Platelets: 213 10*3/uL (ref 150.0–400.0)
RBC: 5.15 Mil/uL — ABNORMAL HIGH (ref 3.87–5.11)
RDW: 12.9 % (ref 11.5–15.5)
WBC: 10.8 10*3/uL — AB (ref 4.0–10.5)

## 2018-11-30 LAB — URINALYSIS, ROUTINE W REFLEX MICROSCOPIC
BILIRUBIN URINE: NEGATIVE
Ketones, ur: NEGATIVE
LEUKOCYTES UA: NEGATIVE
NITRITE: NEGATIVE
Specific Gravity, Urine: 1.02 (ref 1.000–1.030)
Total Protein, Urine: NEGATIVE
Urine Glucose: NEGATIVE
Urobilinogen, UA: 0.2 (ref 0.0–1.0)
pH: 5.5 (ref 5.0–8.0)

## 2018-11-30 LAB — COMPREHENSIVE METABOLIC PANEL
ALT: 16 U/L (ref 0–35)
AST: 18 U/L (ref 0–37)
Albumin: 4.1 g/dL (ref 3.5–5.2)
Alkaline Phosphatase: 91 U/L (ref 39–117)
BUN: 21 mg/dL (ref 6–23)
CALCIUM: 9.6 mg/dL (ref 8.4–10.5)
CO2: 31 mEq/L (ref 19–32)
Chloride: 102 mEq/L (ref 96–112)
Creatinine, Ser: 0.99 mg/dL (ref 0.40–1.20)
GFR: 58.65 mL/min — ABNORMAL LOW (ref >60.00)
Glucose, Bld: 93 mg/dL (ref 70–99)
Potassium: 3.5 mEq/L (ref 3.5–5.1)
Sodium: 139 mEq/L (ref 135–145)
Total Bilirubin: 0.5 mg/dL (ref 0.2–1.2)
Total Protein: 7.7 g/dL (ref 6.0–8.3)

## 2018-11-30 LAB — AMYLASE: Amylase: 46 U/L (ref 27–131)

## 2018-11-30 LAB — LIPASE: Lipase: 17 U/L (ref 11.0–59.0)

## 2018-11-30 MED ORDER — PANTOPRAZOLE SODIUM 40 MG PO TBEC: 40.0000 mg | DELAYED_RELEASE_TABLET | Freq: Two times a day (BID) | ORAL | 0 refills | Status: DC

## 2018-11-30 NOTE — Patient Instructions (Signed)
Gastritis, Adult  Gastritis is swelling (inflammation) of the stomach. When you have this condition, you can have these problems (symptoms):  ? Pain in your stomach.  ? A burning feeling in your stomach.  ? Feeling sick to your stomach (nauseous).  ? Throwing up (vomiting).  ? Feeling too full after you eat.  It is important to get help for this condition. Without help, your stomach can bleed, and you can get sores (ulcers) in your stomach.  Follow these instructions at home:  ? Take over-the-counter and prescription medicines only as told by your doctor.  ? If you were prescribed an antibiotic medicine, take it as told by your doctor. Do not stop taking it even if you start to feel better.  ? Drink enough fluid to keep your pee (urine) clear or pale yellow.  ? Instead of eating big meals, eat small meals often.  Contact a health care provider if:  ? Your problems get worse.  ? Your problems go away and then come back.  Get help right away if:  ? You throw up blood or something that looks like coffee grounds.  ? You have black or dark red poop (stools).  ? You cannot keep fluids down.  ? Your stomach pain gets worse.  ? You have a fever.  ? You do not feel better after 1 week.  This information is not intended to replace advice given to you by your health care provider. Make sure you discuss any questions you have with your health care provider.  Document Released: 06/02/2008 Document Revised: 08/13/2016 Document Reviewed: 09/08/2015  Elsevier Interactive Patient Education ? 2018 Elsevier Inc.

## 2018-11-30 NOTE — Progress Notes (Signed)
Rachel Gardner is a 72 y.o. female with the following history as recorded in EpicCare:  Patient Active Problem List   Diagnosis Date Noted  . BPPV (benign paroxysmal positional vertigo) 10/25/2018  . GERD (gastroesophageal reflux disease) 06/04/2017  . Former smoker 03/19/2016  . Aortic atherosclerosis ([place]) 03/19/2016  . Dilated aortic root ([place]) 03/19/2016  . Routine general medical examination at a health care facility 09/25/2015  . Thoracic ascending aortic aneurysm ([place])   . Lung nodule seen on imaging study   . Chronic midline posterior neck pain 08/04/2014  . Hematuria, microscopic 05/31/2014  . Vitamin D deficiency 05/31/2014  . Arthralgia 05/24/2014  . Fecal incontinence 05/24/2014  . Hyperlipidemia 10/03/2013  . Acquired hypothyroidism 08/16/2013  . Glaucoma 08/16/2013  . Allergic rhinitis 08/16/2013  . HTN (hypertension) 08/16/2013    Current Outpatient Medications  Medication Sig Dispense Refill  . ascorbic acid (VITAMIN C) 1000 MG tablet Take 500 mg by mouth daily.     Marland Kitchen aspirin 81 MG chewable tablet Chew 81 mg by mouth daily.     . bimatoprost (LUMIGAN) 0.01 % SOLN Place 1 drop into both eyes at bedtime.    . Calcium Carb-Cholecalciferol (CALCIUM 500 + D3) 500-600 MG-UNIT TABS Take 1 Dose by mouth daily.     Marland Kitchen levothyroxine (SYNTHROID, LEVOTHROID) 137 MCG tablet Take 1 tablet (137 mcg total) by mouth daily before breakfast. 90 tablet 0  . losartan-hydrochlorothiazide (HYZAAR) 100-25 MG per tablet Take 1 tablet by mouth daily.     . meclizine (ANTIVERT) 25 MG tablet Take 1 tablet (25 mg total) by mouth 3 (three) times daily as needed for dizziness. 90 tablet 4  . Omega-3 Fatty Acids (FISH OIL PO) Take 1 tablet by mouth daily.    . pantoprazole (PROTONIX) 40 MG tablet TAKE 1 TABLET(40 MG) BY MOUTH TWICE DAILY BEFORE A MEAL 180 tablet 0   No current facility-administered medications for this visit.     Allergies: Morphine and related; Statins; Tape; and Wellbutrin  [bupropion]  Past Medical History:  Diagnosis Date  . Allergy    hay fever  . Ascending aortic aneurysm ([place])   . Blood in stool   . BPPV (benign paroxysmal positional vertigo)   . Diverticulitis   . Fatty liver 05/28/10   per [facility] at [facility]  . Genital herpes   . GERD (gastroesophageal reflux disease)   . Glaucoma   . Helicobacter pylori infection   . Hiatal hernia 05/28/10   CT @ [facility]  . Hyperlipidemia   . Hypertension   . Hypothyroidism   . 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 Kidney stones   . Lung nodule seen on imaging study   . Meniere's disease   . Obstructive sleep apnea     Past Surgical History:  Procedure Laterality Date  . ABDOMINAL HYSTERECTOMY  1995  . ANAL RECTAL MANOMETRY N/A 08/06/2016   Procedure: ANO RECTAL MANOMETRY;  Surgeon: Doran Stabler, MD;  Location: [facility];  Service: Gastroenterology;  Laterality: N/A;  . APPENDECTOMY  1989  . BPPV     x 4 after L cochlear implant  . BREAST SURGERY  1989   breast reduction  . CHOLECYSTECTOMY  1989  . COCHLEAR IMPLANT     Left/2009; Right/2012  . LAPAROSCOPIC SIGMOID COLECTOMY  2012   with takedown of splenic flexure; cystoscopy with bilateral ureteral stent placement  . REDUCTION MAMMAPLASTY Bilateral 1989   [place]  . TOTAL THYROIDECTOMY  1995    Family History  Problem Relation Age of Onset  . Hypertension Mother   . Hypertension Father   . Hypertension Sister   . Prostate cancer Brother   . Hyperlipidemia Brother   . Colon cancer Maternal Aunt   . Prostate cancer Paternal Uncle   . Colon cancer Cousin        x 2; paternal  . Thyroid disease Sister     Social History   Tobacco Use  . Smoking status: Current Every Day Smoker    Packs/day: 0.50    Years: 41.00    Pack years: 20.50    Types: Cigarettes    Last attempt to quit: 01/18/2016    Years since quitting: 2.8  . Smokeless tobacco: Never Used  Substance Use Topics  . Alcohol use: Yes    Alcohol/week:  0.0 standard drinks    Comment: rarely    Subjective:  Patient presents with concerns for abdominal pain x 1 week; feels like pain is localized in RUQ/ "burning sensation." s/p cholecystectomy; Feels like urinary output is less; no relief with ginger tea; does have history of GERD- does not take any medication; feels like symptoms started after eating some left over [place]; notes that granddaughter sick after eating the same food; no vomiting but has "tasted bile" in back of throat; no fever; no diarrhea, no changes in bowel movements; will be traveling to [facility] at 5:00 pm today;      Objective:  Vitals:   11/30/18 1311  BP: 124/72  Pulse: 83  Temp: 98.2 F (36.8 C)  TempSrc: Oral  SpO2: 96%  Weight: 135 lb (61.2 kg)  Height: 5' 1"  (1.549 m)    General: Well developed, well nourished, in no acute distress  Skin : Warm and dry.  Head: Normocephalic and atraumatic  Eyes: Sclera and conjunctiva clear; pupils round and reactive to light; extraocular movements intact  Ears: External normal; canals clear; tympanic membranes normal  Oropharynx: Pink, supple. No suspicious lesions  Neck: Supple without thyromegaly, adenopathy  Lungs: Respirations unlabored; clear to auscultation bilaterally without wheeze, rales, rhonchi  CVS exam: normal rate and regular rhythm.  Abdomen: Soft; nontender; nondistended; normoactive bowel sounds; no masses or hepatosplenomegaly  Neurologic: Alert and oriented; speech intact; face symmetrical; moves all extremities well; CNII-XII intact without focal deficit   Assessment:  1. Abdominal pain, unspecified abdominal location     Plan:  ? Gastritis; will check CBC, CMP, U/A today since patient is traveling; Rx for Protonix 40 mg bid x 5-7 days; ER precautions due to upcoming travel; follow-up worse, no better.    No follow-ups on file.  Orders Placed This Encounter  Procedures  . Urinalysis    Standing Status:   Future    Number of Occurrences:    1    Standing Expiration Date:   11/30/2019  . CBC w/Diff    Standing Status:   Future    Number of Occurrences:   1    Standing Expiration Date:   11/30/2019  . Comp Met (CMET)    Standing Status:   Future    Number of Occurrences:   1    Standing Expiration Date:   11/30/2019  . Amylase    Standing Status:   Future    Number of Occurrences:   1    Standing Expiration Date:   11/30/2019  . Lipase    Standing Status:   Future    Number of Occurrences:  1    Standing Expiration Date:   11/30/2019    Requested Prescriptions    No prescriptions requested or ordered in this encounter

## 2018-12-07 ENCOUNTER — Encounter: Payer: Self-pay | Admitting: Internal Medicine

## 2018-12-07 ENCOUNTER — Other Ambulatory Visit: Payer: Self-pay | Admitting: *Deleted

## 2018-12-07 DIAGNOSIS — I712 Thoracic aortic aneurysm, without rupture, unspecified: Secondary | ICD-10-CM

## 2018-12-07 DIAGNOSIS — R918 Other nonspecific abnormal finding of lung field: Secondary | ICD-10-CM

## 2018-12-12 ENCOUNTER — Other Ambulatory Visit: Payer: Self-pay | Admitting: Family

## 2018-12-14 DIAGNOSIS — J343 Hypertrophy of nasal turbinates: Secondary | ICD-10-CM | POA: Diagnosis not present

## 2018-12-14 DIAGNOSIS — J342 Deviated nasal septum: Secondary | ICD-10-CM | POA: Diagnosis not present

## 2018-12-14 DIAGNOSIS — J310 Chronic rhinitis: Secondary | ICD-10-CM | POA: Diagnosis not present

## 2019-01-13 ENCOUNTER — Encounter: Payer: Self-pay | Admitting: Internal Medicine

## 2019-01-14 DIAGNOSIS — K5792 Diverticulitis of intestine, part unspecified, without perforation or abscess without bleeding: Secondary | ICD-10-CM | POA: Diagnosis not present

## 2019-01-18 ENCOUNTER — Ambulatory Visit: Payer: Medicare Other | Admitting: Physician Assistant

## 2019-01-19 ENCOUNTER — Other Ambulatory Visit: Payer: Self-pay | Admitting: Gastroenterology

## 2019-01-19 ENCOUNTER — Ambulatory Visit
Admission: RE | Admit: 2019-01-19 | Discharge: 2019-01-19 | Disposition: A | Discharge status: Home / Self Care | Payer: Medicare Other | Source: Ambulatory Visit | Attending: Surgery | Admitting: Surgery

## 2019-01-19 ENCOUNTER — Ambulatory Visit: Payer: Medicare Other | Admitting: Surgery

## 2019-01-19 DIAGNOSIS — I712 Thoracic aortic aneurysm, without rupture, unspecified: Secondary | ICD-10-CM

## 2019-01-19 DIAGNOSIS — K5792 Diverticulitis of intestine, part unspecified, without perforation or abscess without bleeding: Secondary | ICD-10-CM

## 2019-01-19 DIAGNOSIS — R918 Other nonspecific abnormal finding of lung field: Secondary | ICD-10-CM

## 2019-01-24 ENCOUNTER — Ambulatory Visit
Admission: RE | Admit: 2019-01-24 | Discharge: 2019-01-24 | Disposition: A | Discharge status: Home / Self Care | Payer: Medicare Other | Source: Ambulatory Visit | Attending: Gastroenterology | Admitting: Gastroenterology

## 2019-01-24 DIAGNOSIS — K5792 Diverticulitis of intestine, part unspecified, without perforation or abscess without bleeding: Secondary | ICD-10-CM

## 2019-01-24 DIAGNOSIS — K5730 Diverticulosis of large intestine without perforation or abscess without bleeding: Secondary | ICD-10-CM | POA: Diagnosis not present

## 2019-01-24 DIAGNOSIS — N281 Cyst of kidney, acquired: Secondary | ICD-10-CM | POA: Diagnosis not present

## 2019-01-24 MED ORDER — IOPAMIDOL (ISOVUE-300) INJECTION 61%
100.0000 mL | Freq: Once | INTRAVENOUS | Status: AC | PRN
  Administered 2019-01-24: 100 mL via INTRAVENOUS

## 2019-01-26 ENCOUNTER — Other Ambulatory Visit: Payer: Self-pay

## 2019-01-26 ENCOUNTER — Ambulatory Visit: Payer: Medicare Other | Admitting: Surgery

## 2019-01-26 ENCOUNTER — Encounter: Payer: Self-pay | Admitting: Surgery

## 2019-01-26 VITALS — BP 106/70 | HR 75 | Resp 16 | Ht 61.0 in | Wt 137.0 lb

## 2019-01-26 DIAGNOSIS — I712 Thoracic aortic aneurysm, without rupture, unspecified: Secondary | ICD-10-CM

## 2019-01-26 DIAGNOSIS — R911 Solitary pulmonary nodule: Secondary | ICD-10-CM | POA: Diagnosis not present

## 2019-01-26 NOTE — Progress Notes (Signed)
HPI:  The patient returns today for follow-up of 2 small lung nodules and a small fusiform ascending aortic aneurysm.  She has been feeling well without chest pain or shortness of breath.  She recently had an episode of diverticulitis but has recovered from that.  Current Outpatient Medications  Medication Sig Dispense Refill  . ascorbic acid (VITAMIN C) 1000 MG tablet Take 500 mg by mouth daily.     Marland Kitchen aspirin 81 MG chewable tablet Chew 81 mg by mouth daily.     . bimatoprost (LUMIGAN) 0.01 % SOLN Place 1 drop into both eyes at bedtime.    . Calcium Carb-Cholecalciferol (CALCIUM 500 + D3) 500-600 MG-UNIT TABS Take 1 Dose by mouth daily.     Marland Kitchen levothyroxine (SYNTHROID, LEVOTHROID) 137 MCG tablet Take 1 tablet (137 mcg total) by mouth daily before breakfast. 90 tablet 0  . losartan-hydrochlorothiazide (HYZAAR) 100-25 MG per tablet Take 1 tablet by mouth daily.     . meclizine (ANTIVERT) 25 MG tablet Take 1 tablet (25 mg total) by mouth 3 (three) times daily as needed for dizziness. 90 tablet 4  . Omega-3 Fatty Acids (FISH OIL PO) Take 1 tablet by mouth daily.    . pantoprazole (PROTONIX) 40 MG tablet TAKE 1 TABLET(40 MG) BY MOUTH TWICE DAILY BEFORE A MEAL 180 tablet 0   No current facility-administered medications for this visit.      Physical Exam: BP 106/70 (BP Location: Right Arm, Patient Position: Sitting, Cuff Size: Normal) Comment: MANUAL  Pulse 75   Resp 16   Ht 5\' 1"  (1.549 m)   Wt 137 lb (62.1 kg)   SpO2 97% Comment: ON RA  BMI 25.89 kg/m  She looks well. Cardiac exam shows a regular rate and rhythm with normal heart sounds.  There is no murmur. Lungs are clear.  Diagnostic Tests:  CLINICAL DATA:  Follow-up thoracic aortic aneurysm and pulmonary nodule.  EXAM: CT CHEST WITHOUT CONTRAST  TECHNIQUE: Multidetector CT imaging of the chest was performed following the standard protocol without IV contrast.  COMPARISON:  12/31/2017 chest  CT.  FINDINGS: Cardiovascular: Top-normal heart size. No significant pericardial effusion/thickening. Three-vessel coronary atherosclerosis. Atherosclerotic thoracic aorta with stable ectatic 4.1 cm ascending thoracic aorta. Normal caliber descending thoracic aorta. Normal caliber pulmonary arteries.  Mediastinum/Nodes: Total thyroidectomy. Unremarkable esophagus. No pathologically enlarged axillary, mediastinal or hilar lymph nodes, noting limited sensitivity for the detection of hilar adenopathy on this noncontrast study.  Lungs/Pleura: No pneumothorax. No pleural effusion. Solid 8 mm posterior left lower lobe pulmonary nodule (series 8/image 59) and subpleural 4 mm right pulmonary nodule along the minor fissure (series 8/image 77), both stable since 12/08/2016 chest CT, considered benign. No acute consolidative airspace disease, lung masses or new significant pulmonary nodules. Stable tiny calcified left lower lobe granuloma.  Upper abdomen: No acute abnormality.  Musculoskeletal: No aggressive appearing focal osseous lesions. Moderate thoracic spondylosis.  IMPRESSION: 1. Stable 4.1 cm ectatic ascending thoracic aorta. Recommend annual imaging followup by CTA or MRA. This recommendation follows 2010 ACCF/AHA/AATS/ACR/ASA/SCA/SCAI/SIR/STS/SVM Guidelines for the Diagnosis and Management of Patients with Thoracic Aortic Disease. Circulation. 2010; 121: Y073-X106. Aortic aneurysm NOS (ICD10-I71.9) 2. Pulmonary nodules are stable since 2017 chest CT, considered benign. No active pulmonary disease. 3. Three-vessel coronary atherosclerosis.  Aortic Atherosclerosis (ICD10-I70.0).   Electronically Signed   By: Ilona Sorrel M.D.   On: 01/19/2019 11:21   Impression:  She has a stable 4.1 cm fusiform ascending aortic aneurysm.  This is still well below  the 5.5 cm surgical threshold.  Her blood pressure is under good control.  She also has 2 small lung nodules which  are stable dating back to 2017.  By CT criteria they are considered benign.  I reviewed the CT images with her and answered her questions.  I have recommended that she have a repeat CT of the chest without contrast in 1 year.  Plan:  I will see her back in 1 year with a CT scan of the chest without contrast.  I spent 15 minutes performing this established patient evaluation and > 50% of this time was spent face to face counseling and coordinating the care of this patient's aortic aneurysm and small lung nodules.    Gaye Pollack, MD [facility] [phone]

## 2019-01-27 ENCOUNTER — Other Ambulatory Visit: Payer: Self-pay | Admitting: Endocrinology

## 2019-01-31 ENCOUNTER — Encounter: Payer: Self-pay | Admitting: Internal Medicine

## 2019-01-31 DIAGNOSIS — K5732 Diverticulitis of large intestine without perforation or abscess without bleeding: Secondary | ICD-10-CM | POA: Diagnosis not present

## 2019-01-31 DIAGNOSIS — R918 Other nonspecific abnormal finding of lung field: Secondary | ICD-10-CM | POA: Diagnosis not present

## 2019-02-03 ENCOUNTER — Encounter: Payer: Self-pay | Admitting: Internal Medicine

## 2019-02-03 ENCOUNTER — Ambulatory Visit: Payer: Medicare Other | Admitting: Endocrinology

## 2019-02-03 ENCOUNTER — Ambulatory Visit: Payer: Medicare Other | Admitting: Internal Medicine

## 2019-02-03 ENCOUNTER — Encounter: Payer: Self-pay | Admitting: Endocrinology

## 2019-02-03 VITALS — BP 124/74 | HR 103 | Ht 61.0 in | Wt 137.6 lb

## 2019-02-03 VITALS — BP 110/68 | HR 83 | Ht 61.0 in | Wt 137.4 lb

## 2019-02-03 DIAGNOSIS — E039 Hypothyroidism, unspecified: Secondary | ICD-10-CM | POA: Diagnosis not present

## 2019-02-03 DIAGNOSIS — Z87891 Personal history of nicotine dependence: Secondary | ICD-10-CM

## 2019-02-03 DIAGNOSIS — R918 Other nonspecific abnormal finding of lung field: Secondary | ICD-10-CM | POA: Diagnosis not present

## 2019-02-03 DIAGNOSIS — R590 Localized enlarged lymph nodes: Secondary | ICD-10-CM | POA: Diagnosis not present

## 2019-02-03 LAB — T4, FREE: Free T4: 1.28 ng/dL (ref 0.60–1.60)

## 2019-02-03 LAB — TSH: TSH: 0.23 u[IU]/mL — ABNORMAL LOW (ref 0.35–4.50)

## 2019-02-03 MED ORDER — LEVOTHYROXINE SODIUM 125 MCG PO TABS: 125.0000 ug | ORAL_TABLET | Freq: Every day | ORAL | 3 refills | Status: DC

## 2019-02-03 NOTE — Progress Notes (Signed)
Subjective:    Patient ID: Rachel Gardner, female    DOB: [birth year], 73 y.o.   MRN: [id number]  IOV 02/03/2019    HPI  Chief Complaint  Patient presents with  . Consult    Pt referred due to CT scan that was performed. Pt stated the CT showed that she had a lung nodule. Pt does have complaints of cough with clear mucus. Denies any complaints of SOB or chest tightness.   Rachel Gardner 73 y.o. -is a retired diabetes Investment banker, corporate.  She is from the state of [place] in [place].  She migrated to [place] in the 1990s.  She is a continued smoker.  She follows every year with Rachel Gardner for thoracic aorta aneurysm with a CT scan of the chest.  She says she is known to have a left lower lobe subpleural 8 mm nodule since the 1990s.  And the most recent CT chest January 2020 this was stable.  However radiologist reported a Gardner subpleural 4 mm nodule by the minor fissure.  The radiologist also noted that this was stable since 2017.  Patient contends that this was not documented in the prior report which is true.  Therefore she is wondering how the radiologist can reported as being stable for the last 3 years.  I personally visualized the findings and confirmed the presence of the left lower lobe nodule and stability.  Also confirmed and demonstrated to her the current 4 mm nodule that was seen even in the prior scans but probably missed in the prior reports.  She wants to follow-up with me for the nodules once a year but after Rachel Gardner does the CT scans for the thoracic aorta aneurysm.  In terms of her respiratory health she is overall asymptomatic.,  She travels widely across the world.  In 2019 she did 8 international trips including annual trips to [place].  She is now planning a trip to [place] because her daughter-in-law is running a marathon.    For the last few days she has a scratchy throat and a cough after picking up her respiratory illness from her grandchild.  Her son lives in  [place] and is a Geophysicist/field seismologist Rachel Gardner).  She says he does not know about her visit to the clinic today.  In addition she had some abdominal symptoms and had CT abdomen and it is noted that she has some intra-abdominal lymphadenopathy that is slightly enlarged compared to 4 years earlier.  She has seen Rachel Gardner her endocrinologist who is referring her to a hematologist.  Noted she is never had pulmonary function testing before.  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  She uses hearing aids. Results for Rachel Gardner, Rachel Gardner (MRN [id number]) as of 02/03/2019 16:09  Ref. Range 01/14/2014 23:15 05/24/2014 17:18 03/06/2017 00:00 11/30/2018 13:35  Eosinophils Absolute Latest Ref Range: 0.0 - 0.7 K/uL 0.2 0.1  0.2    Review of Systems  Constitutional: Negative for fever and unexpected weight change.  HENT: Positive for hearing loss, postnasal drip and sinus pressure. Negative for congestion, dental problem, ear pain, nosebleeds, rhinorrhea, sneezing, sore throat and trouble swallowing.   Eyes: Negative for redness and itching.  Respiratory: Positive for cough. Negative for chest tightness, shortness of breath and wheezing.   Cardiovascular: Negative for palpitations and leg swelling.  Gastrointestinal: Negative for nausea and vomiting.  Genitourinary: Negative for dysuria.  Musculoskeletal: Negative for joint swelling.  Skin:  Negative for rash.  Allergic/Immunologic: Positive for environmental allergies. Negative for food allergies and immunocompromised state.  Neurological: Negative for headaches.  Hematological: Does not bruise/bleed easily.  Psychiatric/Behavioral: Negative for dysphoric mood. The patient is not nervous/anxious.      has a past medical history of Allergy, Ascending aortic aneurysm ([place]), Blood in stool, BPPV (benign paroxysmal positional vertigo), Diverticulitis, Fatty liver (05/28/10), Genital herpes, GERD (gastroesophageal reflux disease),  Glaucoma, Helicobacter pylori infection, Hiatal hernia (05/28/10), Hyperlipidemia, Hypertension, Hypothyroidism, Incontinence of urine in female, Kidney stones, Lung nodule seen on imaging study, Meniere's disease, and Obstructive sleep apnea.   reports that she has been smoking cigarettes. She has a 20.50 pack-year smoking history. She has never used smokeless tobacco.  Past Surgical History:  Procedure Laterality Date  . ABDOMINAL HYSTERECTOMY  1995  . ANAL RECTAL MANOMETRY N/A 08/06/2016   Procedure: ANO RECTAL MANOMETRY;  Surgeon: Doran Stabler, MD;  Location: [facility];  Service: Gastroenterology;  Laterality: N/A;  . APPENDECTOMY  1989  . BPPV     x 4 after L cochlear implant  . BREAST SURGERY  1989   breast reduction  . CHOLECYSTECTOMY  1989  . COCHLEAR IMPLANT     Left/2009; Gardner/2012  . LAPAROSCOPIC SIGMOID COLECTOMY  2012   with takedown of splenic flexure; cystoscopy with bilateral ureteral stent placement  . REDUCTION MAMMAPLASTY Bilateral 1989   [place]  . TOTAL THYROIDECTOMY  1995    Allergies  Allergen Reactions  . Morphine And Related     Nausea Can tolerate Dilaudid  . Statins Other (See Comments)    Joint pain   . Tape Other (See Comments)    Causes redness and will take skin when removed  . Wellbutrin [Bupropion] Other (See Comments)    Suicidal intensions    Immunization History  Administered Date(s) Administered  . Influenza, High Dose Seasonal PF 10/08/2017, 10/11/2018  . Influenza,inj,Quad PF,6+ Mos 10/03/2013, 11/16/2014, 09/24/2015, 09/19/2016  . Pneumococcal Conjugate-13 10/03/2013  . Pneumococcal Polysaccharide-23 04/28/2008, 09/24/2015  . Td 08/09/2014    Family History  Problem Relation Age of Onset  . Hypertension Mother   . Hypertension Father   . Hypertension Sister   . Prostate cancer Brother   . Hyperlipidemia Brother   . Colon cancer Maternal Aunt   . Prostate cancer Paternal Uncle   . Colon cancer Cousin        x 2;  paternal  . Thyroid disease Sister      Current Outpatient Medications:  .  ascorbic acid (VITAMIN C) 1000 MG tablet, Take 500 mg by mouth daily. , Disp: , Rfl:  .  aspirin 81 MG chewable tablet, Chew 81 mg by mouth daily. , Disp: , Rfl:  .  bimatoprost (LUMIGAN) 0.01 % SOLN, Place 1 drop into both eyes at bedtime., Disp: , Rfl:  .  Calcium Carb-Cholecalciferol (CALCIUM 500 + D3) 500-600 MG-UNIT TABS, Take 1 Dose by mouth daily. , Disp: , Rfl:  .  levothyroxine (SYNTHROID, LEVOTHROID) 137 MCG tablet, Take 1 tablet (137 mcg total) by mouth daily before breakfast., Disp: 90 tablet, Rfl: 0 .  losartan-hydrochlorothiazide (HYZAAR) 100-25 MG per tablet, Take 1 tablet by mouth daily. , Disp: , Rfl:  .  meclizine (ANTIVERT) 25 MG tablet, Take 1 tablet (25 mg total) by mouth 3 (three) times daily as needed for dizziness., Disp: 90 tablet, Rfl: 4 .  Omega-3 Fatty Acids (FISH OIL PO), Take 1 tablet by mouth daily., Disp: , Rfl:  .  pantoprazole (PROTONIX) 40 MG tablet, TAKE 1 TABLET(40 MG) BY MOUTH TWICE DAILY BEFORE A MEAL, Disp: 180 tablet, Rfl: 0     Objective:   Physical Exam   Vitals:   02/03/19 1600  BP: 110/68  Pulse: 83  SpO2: 99%  Weight: 137 lb 6.4 oz (62.3 kg)  Height: 5\' 1"  (1.549 m)    Estimated body mass index is 25.96 kg/m as calculated from the following:   Height as of this encounter: 5\' 1"  (1.549 m).   Weight as of this encounter: 137 lb 6.4 oz (62.3 kg).  General Appearance:    Alert, cooperative, no distress, appears stated age - yes , sitting on - chair  Head:    Normocephalic, without obvious abnormality, atraumatic  Eyes:    PERRL, conjunctiva/corneas clear,  Ears:    Normal TM's and external ear canals, both ears  Nose:   Nares normal, septum midline, mucosa normal, no drainage    or sinus tenderness. OXYGEN ON no @ ra  Throat:   Lips, mucosa, and tongue normal; teeth and gums normal. Cyanosis on lips - no  Neck:   Supple, symmetrical, trachea midline, no  adenopathy;    thyroid:  no enlargement/tenderness/nodules; no carotid   bruit or JVD  Back:     Symmetric, no curvature, ROM normal, no CVA tenderness  Lungs:     Distress - no , Wheeze no, Barrell Chest - no, Purse lip breathing - no, Crackles - no   Chest Wall:    No tenderness or deformity. Scars in chest no   Heart:    Regular rate and rhythm, S1 and S2 normal, no murmur, rub   or gallop  Breast Exam:    NOT DONE  Abdomen:     Soft, non-tender, bowel sounds active all four quadrants,    no masses, no organomegaly  Genitalia:   NOT DONE  Rectal:   NOT DONE  Extremities:   Extremities normal, atraumatic, Clubbing - no, Edema - no  Pulses:   2+ and symmetric all extremities  Skin:   Stigmata of Connective Tissue Disease - no  Lymph nodes:   Cervical, supraclavicular, and axillary nodes normal  Psychiatric:  Neurologic:   plesant CNII-XII intact, normal strength, sensation  throughout            Assessment & Plan:     ICD-10-CM   1. Multiple lung nodules on CT R91.8   2. Smoking history Z87.891 Pulmonary function test  3. Intra-abdominal lymphadenopathy R59.0     Patient Instructions  Multiple lung nodules on CT  - cT chest jan 2021 per Dr Cyndia Bent  - return to see me in feb 2021  Smoking history  - please work on quitting smoking - do pft test between now and feb 2021  Intra-abdominal lymphadenopathy  - per Dr Kerin Salen, MD  Followup 1 year but after PFT  [facility] [place] [facility]        SIGNATURE    Dr. Brand Males, M.D., F.C.C.P,  Pulmonary and Critical Care Medicine Staff Physician, Jay Director - Interstitial Lung Disease  Program  [facility] [place] [facility] [place], [place], [address]  Pager: [phone], If no answer or between  15:00h - 7:00h: call [phone] Telephone: [phone]  5:30 PM 02/03/2019

## 2019-02-03 NOTE — Progress Notes (Signed)
Subjective:    Patient ID: Rachel Gardner, female    DOB: Oct 28, 1946, 73 y.o.   MRN: [id number]  HPI Pt returns for f/u of postsurgical hypothyroidism (she had thyroidectomy in 2005, for a goiter--was benign on pathology.  she has been on prescribed thyroid hormone therapy since then; synthroid dosage has varied from 125 to 200 mcg/d).  pt states she feels well in general.  She takes synthroid as rx'ed.  Pt is asking to see a specialist about abd lymphadenopathy seen on CT.   Past Medical History:  Diagnosis Date  . Allergy    hay fever  . Ascending aortic aneurysm ([place])   . Blood in stool   . BPPV (benign paroxysmal positional vertigo)   . Diverticulitis   . Fatty liver 05/28/10   per [facility] at [facility]  . Genital herpes   . GERD (gastroesophageal reflux disease)   . Glaucoma   . Helicobacter pylori infection   . Hiatal hernia 05/28/10   CT @ [facility]  . Hyperlipidemia   . Hypertension   . Hypothyroidism   . 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 Kidney stones   . Lung nodule seen on imaging study   . Meniere's disease   . Obstructive sleep apnea     Past Surgical History:  Procedure Laterality Date  . ABDOMINAL HYSTERECTOMY  1995  . ANAL RECTAL MANOMETRY N/A 08/06/2016   Procedure: ANO RECTAL MANOMETRY;  Surgeon: Doran Stabler, MD;  Location: [facility];  Service: Gastroenterology;  Laterality: N/A;  . APPENDECTOMY  1989  . BPPV     x 4 after L cochlear implant  . BREAST SURGERY  1989   breast reduction  . CHOLECYSTECTOMY  1989  . COCHLEAR IMPLANT     Left/2009; Right/2012  . LAPAROSCOPIC SIGMOID COLECTOMY  2012   with takedown of splenic flexure; cystoscopy with bilateral ureteral stent placement  . REDUCTION MAMMAPLASTY Bilateral 1989   [place]  . TOTAL THYROIDECTOMY  1995    Social History   Socioeconomic History  . Marital status: Divorced    Spouse name: Not on file  . Number of children: 3  . Years of education: Not on file  .  Highest education level: Not on file  Occupational History  . Occupation: retired Marine scientist  Social Needs  . Financial resource strain: Not on file  . Food insecurity:    Worry: Not on file    Inability: Not on file  . Transportation needs:    Medical: Not on file    Non-medical: Not on file  Tobacco Use  . Smoking status: Current Every Day Smoker    Packs/day: 0.50    Years: 41.00    Pack years: 20.50    Types: Cigarettes    Last attempt to quit: 01/18/2016    Years since quitting: 3.0  . Smokeless tobacco: Never Used  Substance and Sexual Activity  . Alcohol use: Yes    Alcohol/week: 0.0 standard drinks    Comment: rarely  . Drug use: No  . Sexual activity: Never  Lifestyle  . Physical activity:    Days per week: Not on file    Minutes per session: Not on file  . Stress: Not on file  Relationships  . Social connections:    Talks on phone: Not on file    Gets together: Not on file    Attends religious service: Not on file    Active member of  club or organization: Not on file    Attends meetings of clubs or organizations: Not on file    Relationship status: Not on file  . Intimate partner violence:    Fear of current or ex partner: Not on file    Emotionally abused: Not on file    Physically abused: Not on file    Forced sexual activity: Not on file  Other Topics Concern  . Not on file  Social History Narrative   Regular exercise: walk; 1 mile a day / walk 5 miles weekend;    Caffeine use: 1 cup of coffee in the AM      3 children       Current Outpatient Medications on File Prior to Visit  Medication Sig Dispense Refill  . ascorbic acid (VITAMIN C) 1000 MG tablet Take 500 mg by mouth daily.     Marland Kitchen aspirin 81 MG chewable tablet Chew 81 mg by mouth daily.     . bimatoprost (LUMIGAN) 0.01 % SOLN Place 1 drop into both eyes at bedtime.    . Calcium Carb-Cholecalciferol (CALCIUM 500 + D3) 500-600 MG-UNIT TABS Take 1 Dose by mouth daily.     Marland Kitchen  losartan-hydrochlorothiazide (HYZAAR) 100-25 MG per tablet Take 1 tablet by mouth daily.     . meclizine (ANTIVERT) 25 MG tablet Take 1 tablet (25 mg total) by mouth 3 (three) times daily as needed for dizziness. 90 tablet 4  . Omega-3 Fatty Acids (FISH OIL PO) Take 1 tablet by mouth daily.    . pantoprazole (PROTONIX) 40 MG tablet TAKE 1 TABLET(40 MG) BY MOUTH TWICE DAILY BEFORE A MEAL 180 tablet 0  . fluticasone (FLONASE) 50 MCG/ACT nasal spray Place 2 sprays into both nostrils at bedtime.     No current facility-administered medications on file prior to visit.     Allergies  Allergen Reactions  . Morphine And Related     Nausea Can tolerate Dilaudid  . Statins Other (See Comments)    Joint pain   . Tape Other (See Comments)    Causes redness and will take skin when removed  . Wellbutrin [Bupropion] Other (See Comments)    Suicidal intensions    Family History  Problem Relation Age of Onset  . Hypertension Mother   . Hypertension Father   . Hypertension Sister   . Prostate cancer Brother   . Hyperlipidemia Brother   . Colon cancer Maternal Aunt   . Prostate cancer Paternal Uncle   . Colon cancer Cousin        x 2; paternal  . Thyroid disease Sister     BP 124/74 (BP Location: Right Arm, Patient Position: Sitting, Cuff Size: Normal)   Pulse (!) 103   Ht 5\' 1"  (1.549 m)   Wt 137 lb 9.6 oz (62.4 kg)   SpO2 95%   BMI 26.00 kg/m    Review of Systems Denies dry skin    Objective:   Physical Exam VITAL SIGNS:  See vs page GENERAL: no distress NECK: There is no palpable thyroid enlargement.  No thyroid nodule is palpable.  No palpable lymphadenopathy at the anterior neck.         Assessment & Plan:  Hypothyroidism: recheck today Lymphadenopathy: ref hematol, at pt's request.

## 2019-02-03 NOTE — Patient Instructions (Signed)
Multiple lung nodules on CT  - cT chest jan 2021 per Dr Cyndia Bent  - return to see me in feb 2021  Smoking history  - please work on quitting smoking - do pft test between now and feb 2021  Intra-abdominal lymphadenopathy  - per Dr Kerin Salen, MD  Followup 1 year but after PFT  [facility] [place] [facility]

## 2019-02-03 NOTE — Patient Instructions (Addendum)
Blood tests are requested for you today.  We'll let you know about the results.  Please see a blood specialist.  you will receive a phone call, about a day and time for an appointment Please come back for a follow-up appointment in 1 year.

## 2019-02-04 ENCOUNTER — Telehealth: Payer: Self-pay | Admitting: Hematology

## 2019-02-04 NOTE — Telephone Encounter (Signed)
Patient has a 10 week follow up appointment scheduled for 04/21/19. Patient understands she needs to keep this appointment for insurance compliance. Patient was grateful for the call and thanked me.  

## 2019-02-04 NOTE — Telephone Encounter (Signed)
Received a referral from Dr. Loanne Drilling for lymphadenopathy.I cld and spoke to the pt's son, [name] and scheduled an appt for the pt to see Dr. Irene Limbo on 3/17 at 10am. The pt is currently out of the country and the son has asked that I email the appt info to the pt.

## 2019-02-07 ENCOUNTER — Encounter: Payer: Self-pay | Admitting: Internal Medicine

## 2019-02-07 ENCOUNTER — Other Ambulatory Visit (INDEPENDENT_AMBULATORY_CARE_PROVIDER_SITE_OTHER): Payer: Medicare Other

## 2019-02-07 ENCOUNTER — Ambulatory Visit: Payer: Medicare Other | Admitting: Internal Medicine

## 2019-02-07 VITALS — BP 110/78 | HR 90 | Temp 98.2°F | Ht 61.0 in | Wt 134.0 lb

## 2019-02-07 DIAGNOSIS — R3129 Other microscopic hematuria: Secondary | ICD-10-CM

## 2019-02-07 DIAGNOSIS — R05 Cough: Secondary | ICD-10-CM | POA: Diagnosis not present

## 2019-02-07 DIAGNOSIS — R590 Localized enlarged lymph nodes: Secondary | ICD-10-CM | POA: Diagnosis not present

## 2019-02-07 DIAGNOSIS — I10 Essential (primary) hypertension: Secondary | ICD-10-CM | POA: Diagnosis not present

## 2019-02-07 DIAGNOSIS — R059 Cough, unspecified: Secondary | ICD-10-CM

## 2019-02-07 LAB — CBC
HCT: 44.5 % (ref 36.0–46.0)
Hemoglobin: 15.2 g/dL — ABNORMAL HIGH (ref 12.0–15.0)
MCHC: 34.1 g/dL (ref 30.0–36.0)
MCV: 86.4 fl (ref 78.0–100.0)
Platelets: 221 10*3/uL (ref 150.0–400.0)
RBC: 5.16 Mil/uL — ABNORMAL HIGH (ref 3.87–5.11)
RDW: 13 % (ref 11.5–15.5)
WBC: 10 10*3/uL (ref 4.0–10.5)

## 2019-02-07 LAB — COMPREHENSIVE METABOLIC PANEL
ALT: 15 U/L (ref 0–35)
AST: 15 U/L (ref 0–37)
Albumin: 4 g/dL (ref 3.5–5.2)
Alkaline Phosphatase: 84 U/L (ref 39–117)
BUN: 34 mg/dL — ABNORMAL HIGH (ref 6–23)
CO2: 27 mEq/L (ref 19–32)
CREATININE: 0.9 mg/dL (ref 0.40–1.20)
Calcium: 9.8 mg/dL (ref 8.4–10.5)
Chloride: 105 mEq/L (ref 96–112)
GFR: 61.56 mL/min (ref >60.00)
Glucose, Bld: 113 mg/dL — ABNORMAL HIGH (ref 70–99)
Potassium: 3.3 mEq/L — ABNORMAL LOW (ref 3.5–5.1)
Sodium: 142 mEq/L (ref 135–145)
TOTAL PROTEIN: 7.4 g/dL (ref 6.0–8.3)
Total Bilirubin: 0.7 mg/dL (ref 0.2–1.2)

## 2019-02-07 LAB — URINALYSIS, ROUTINE W REFLEX MICROSCOPIC
Bilirubin Urine: NEGATIVE
Ketones, ur: NEGATIVE
Leukocytes, UA: NEGATIVE
Nitrite: NEGATIVE
SPECIFIC GRAVITY, URINE: 1.02 (ref 1.000–1.030)
Total Protein, Urine: NEGATIVE
Urine Glucose: NEGATIVE
Urobilinogen, UA: 0.2 (ref 0.0–1.0)
WBC, UA: NONE SEEN (ref 0–?)
pH: 5.5 (ref 5.0–8.0)

## 2019-02-07 LAB — LIPID PANEL
CHOL/HDL RATIO: 7
Cholesterol: 231 mg/dL — ABNORMAL HIGH (ref 0–200)
HDL: 34.3 mg/dL — AB (ref >39.00)
NonHDL: 196.7
Triglycerides: 320 mg/dL — ABNORMAL HIGH (ref 0.0–149.0)
VLDL: 64 mg/dL — ABNORMAL HIGH (ref 0.0–40.0)

## 2019-02-07 LAB — LDL CHOLESTEROL, DIRECT: Direct LDL: 166 mg/dL

## 2019-02-07 MED ORDER — DOXYCYCLINE HYCLATE 100 MG PO TABS: 100.0000 mg | ORAL_TABLET | Freq: Two times a day (BID) | ORAL | 0 refills | Status: DC

## 2019-02-07 MED ORDER — OXYBUTYNIN CHLORIDE 5 MG PO TABS: 5.0000 mg | ORAL_TABLET | Freq: Two times a day (BID) | ORAL | 3 refills | Status: DC | PRN

## 2019-02-07 NOTE — Assessment & Plan Note (Signed)
Suspect that this change in size had more to do with recent possible diverticulitis right before scan than cancer. However given her smoking she feels more comfortable with seeing oncology although we discuss just monitoring in about 6 months.

## 2019-02-07 NOTE — Progress Notes (Signed)
   Subjective:   Patient ID: Rachel Gardner, female    DOB: Apr 03, 1946, 73 y.o.   MRN: [id number]  HPI The patient is a 73 YO female coming in for several concerns including cough (grandchild with URI symptoms and she was caring for her, whole family that was exposed is sick and on antibiotics, leaving for [place] in several weeks and wants to be healthy enough for travel, symptoms started about 1 week ago, overall worsening, coughing up sputum, does have some SOB, denies significant nose drainage although there is some, denies fevers or chills currently) and LAD on CT scan (present several years ago but larger on recent CT done, she was having stomach pains and nausea for the last 3 weeks or so, saw GI and they got CT scan done, they gave her antibiotics and due to scheduling she got CT scan done about 1 week after finishing her antibiotics, is a smoker, has talked to her endocrine doctor and they are getting her in with oncology within the next month or so, denies weight change recently, stomach problems have resolved since that time, denies diarrhea or constipation) and hematuria (present during UTI recently, is a smoker, denies urinary complaints currently).   Review of Systems  Constitutional: Positive for activity change, appetite change and fatigue. Negative for fever and unexpected weight change.  HENT: Positive for congestion, postnasal drip and rhinorrhea. Negative for ear discharge, ear pain, sinus pressure, sinus pain, sneezing, sore throat, tinnitus, trouble swallowing and voice change.   Eyes: Negative.   Respiratory: Positive for cough and shortness of breath. Negative for chest tightness and wheezing.   Cardiovascular: Negative.  Negative for chest pain, palpitations and leg swelling.  Gastrointestinal: Negative.  Negative for abdominal distention, abdominal pain, constipation, diarrhea, nausea and vomiting.  Genitourinary: Negative.   Musculoskeletal: Positive for arthralgias.  Skin:  Negative.   Neurological: Negative.   Psychiatric/Behavioral: Negative.     Objective:  Physical Exam Constitutional:      Appearance: She is well-developed.  HENT:     Head: Normocephalic and atraumatic.     Comments: Oropharynx with redness and clear drainage, nose with swollen turbinates, TMs normal bilaterally.     Ears:     Comments: Bilateral hearing device Neck:     Musculoskeletal: Normal range of motion.     Thyroid: No thyromegaly.  Cardiovascular:     Rate and Rhythm: Normal rate and regular rhythm.  Pulmonary:     Effort: Pulmonary effort is normal. No respiratory distress.     Breath sounds: Rhonchi present. No wheezing or rales.     Comments: Mild rhonchi which clear with coughing.  Abdominal:     General: Bowel sounds are normal. There is no distension.     Palpations: Abdomen is soft.     Tenderness: There is no abdominal tenderness. There is no rebound.  Musculoskeletal:        General: Tenderness present.  Lymphadenopathy:     Cervical: No cervical adenopathy.  Skin:    General: Skin is warm and dry.  Neurological:     Mental Status: She is alert and oriented to person, place, and time.     Coordination: Coordination normal.     Vitals:   02/07/19 0821  BP: 110/78  Pulse: 90  Temp: 98.2 F (36.8 C)  TempSrc: Oral  SpO2: 96%  Weight: 134 lb (60.8 kg)  Height: 5\' 1"  (1.549 m)    Assessment & Plan:

## 2019-02-07 NOTE — Patient Instructions (Signed)
We have sent in the antibiotic doxycycline to take 1 pill twice a day for 1 week.    

## 2019-02-07 NOTE — Assessment & Plan Note (Signed)
Checking U/A and adjust as needed. She is a smoker although she has had cystoscopy in the past.

## 2019-02-07 NOTE — Assessment & Plan Note (Signed)
Rx for doxycycline. Counseled about smoking cessation. Continue flonase.

## 2019-02-08 ENCOUNTER — Encounter: Payer: Self-pay | Admitting: Internal Medicine

## 2019-02-09 DIAGNOSIS — G4733 Obstructive sleep apnea (adult) (pediatric): Secondary | ICD-10-CM | POA: Diagnosis not present

## 2019-02-09 DIAGNOSIS — Z9989 Dependence on other enabling machines and devices: Secondary | ICD-10-CM

## 2019-02-09 HISTORY — DX: Dependence on other enabling machines and devices: Z99.89

## 2019-02-10 ENCOUNTER — Ambulatory Visit: Payer: Medicare Other | Admitting: Podiatry

## 2019-02-10 ENCOUNTER — Encounter: Payer: Self-pay | Admitting: Podiatry

## 2019-02-10 VITALS — BP 110/77 | HR 89 | Resp 16

## 2019-02-10 DIAGNOSIS — L600 Ingrowing nail: Secondary | ICD-10-CM

## 2019-02-10 MED ORDER — NEOMYCIN-POLYMYXIN-HC 3.5-10000-1 OT SOLN: OTIC | 0 refills | Status: DC

## 2019-02-10 NOTE — Patient Instructions (Signed)

## 2019-02-10 NOTE — Progress Notes (Signed)
   Subjective:    Patient ID: Rachel Gardner, female    DOB: 18-Sep-1946, 73 y.o.   MRN: [id number]  HPI    Review of Systems  All other systems reviewed and are negative.      Objective:   Physical Exam        Assessment & Plan:

## 2019-02-10 NOTE — Progress Notes (Signed)
Subjective:   Patient ID: Rachel Gardner, female   DOB: 73 y.o.   MRN: [id number]   HPI Patient presents stating she is developed a lot of pain in the left ingrown toenail and states that it is been bothering her for several months and she is tried to trim and soak it without relief and she had damaged it in the past.  Patient does not smoke and likes to be active   Review of Systems  All other systems reviewed and are negative.       Objective:  Physical Exam Vitals signs and nursing note reviewed.  Constitutional:      Appearance: She is well-developed.  Pulmonary:     Effort: Pulmonary effort is normal.  Musculoskeletal: Normal range of motion.  Skin:    General: Skin is warm.  Neurological:     Mental Status: She is alert.     Neurovascular status intact muscle strength is adequate range of motion within normal limits with patient found to have incurvated medial border left hallux that is painful when pressed and makes shoe gear and walking difficult.  Patient is noted to have good digital perfusion and is well oriented x3     Assessment:  Acute ingrown toenail deformity with trauma damage to the nail plate as part of the problem     Plan:  H&P condition reviewed and recommended removal of the nail border.  I explained the procedure risk and patient wants surgery and at this point signed consent form after review and at this point I infiltrated the left hallux 60 mg like Marcaine mixture I remove the border exposed matrix and applied phenol 3 applications 30 seconds followed by alcohol lavage sterile dressing and gave instructions on soaks and advised her to leave dressing on 24 hours but to take it off earlier if any symptoms should occur.  Also wrote prescription for antibiotic drops and encouraged to call with questions concerns during the postoperative recovery process

## 2019-02-18 DIAGNOSIS — H401131 Primary open-angle glaucoma, bilateral, mild stage: Secondary | ICD-10-CM | POA: Diagnosis not present

## 2019-02-22 DIAGNOSIS — R3121 Asymptomatic microscopic hematuria: Secondary | ICD-10-CM | POA: Diagnosis not present

## 2019-02-22 DIAGNOSIS — N3946 Mixed incontinence: Secondary | ICD-10-CM | POA: Diagnosis not present

## 2019-03-10 DIAGNOSIS — G4733 Obstructive sleep apnea (adult) (pediatric): Secondary | ICD-10-CM | POA: Diagnosis not present

## 2019-03-14 NOTE — Progress Notes (Signed)
HEMATOLOGY/ONCOLOGY CONSULTATION NOTE  Date of Service: 03/15/2019  Patient Care Team: Hoyt Koch, MD as PCP - General (Internal Medicine) Lafayette Dragon, MD (Inactive) as Consulting Physician (Gastroenterology) Suella Broad, MD as Consulting Physician (Physical Medicine and Rehabilitation) Estevan Ryder, MD as Referring Physician (Cardiology) Renato Shin, MD as Consulting Physician (Endocrinology) Vicie Mutters, MD as Consulting Physician (Otolaryngology)  CHIEF COMPLAINTS/PURPOSE OF CONSULTATION:  Lymphadenopathy  HISTORY OF PRESENTING ILLNESS:   Rachel Gardner is a wonderful 73 y.o. female who has been referred to [name] by Dr. Pricilla Holm for evaluation and management of Lymphadenopathy. The pt reports that she is doing well overall.   The pt notes that she had acid reflux problems in December, saw her PCP, and then began Protonix which resolved her acid reflux. The pt denies any problems swallowing food. She had a CT A/P in January 2020, as noted below, to follow up her abdominal pain, which she denies having anymore. She denies any fevers, chills, night sweats, unexpected weight loss, or noticing any new lumps or bumps.  The pt reports a history of repeated diverticulitis and had 12 inches of her descending colon removed. She notes that she has a cystoscopy ever 2 years for hematuria in the past and denies any previous abnormality found, or explanation of her hematuria. She will be having an additional cystoscopy on 03/24/19. The pt has had her thyroid, uterus, ovaries, gallbladder and appendix removed.  The pt has a CT Chest annually to monitor her aortic aneurysm and pulmonary nodules.   The pt notes that her sleep apnea has been stable.   The pt notes that she smokes about 1-1.5 packs per week, up to 2 packs.  Of note prior to the patient's visit today, pt has had a CT A/P completed on 01/24/19 with results revealing Diverticulosis throughout the colon most  notable sigmoid colon where patient has had prior surgery. No extraluminal inflammation to suggest diverticulitis. No obvious gastric or small bowel Abnormality. 2. 1.2 x 1.4 cm low-density right retrocrural lymph node (series 2, image 12). This measured 1.2 x 0.8 cm on 01/03/2015 chest CT. Mesenteric top-normal size lymph nodes with 1 lymph node having slightly rounded appearance and short axis dimension of 1 cm (series 5, image 29). Etiology of these lymph nodes is indeterminate. If there are progressive symptoms, follow-up CT within the next 6 months may be considered to confirm stability or resolution of these lymph nodes. 3. Bilateral renal low-density structures, larger ones which are cysts, smaller ones too small to characterize although statistically likely cysts. 4.  Aortic Atherosclerosis. 5. Post hysterectomy, cholecystectomy and appendectomy. 6. Degenerative changes lumbar spine, sacroiliac joints, pubic symphysis and hips.  Most recent lab results (02/07/19) of CBC and CMP is as follows: all values are WNL except for RBC at 5.16, HGB at 15.2, Potassium at 3.3, Glucose at 113, BUN at 34.  On review of systems, pt reports good energy levels, eating well, and denies problems swallowing food, noticing any new lumps or bumps, fevers, chills, night sweats, unexpected weight loss, and any other symptoms.   On Social Hx the pt reports that she worked as a Marine scientist in the past. Originally came to [place] from [place] in 1979. She smokes up to 2 packs of cigarettes per week. On Family Hx the pt reports brother with prostate cancer and denies other cancer.   MEDICAL HISTORY:  Past Medical History:  Diagnosis Date  . Allergy    hay fever  .  Ascending aortic aneurysm ([place])   . Blood in stool   . BPPV (benign paroxysmal positional vertigo)   . CPAP (continuous positive airway pressure) dependence 02/09/2019  . Diverticulitis   . Fatty liver 05/28/10   per CT at [facility]  . Genital  herpes   . GERD (gastroesophageal reflux disease)   . Glaucoma   . Helicobacter pylori infection   . Hiatal hernia 05/28/10   CT @ [facility]  . Hyperlipidemia   . Hypertension   . Hypothyroidism   . 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 Kidney stones   . Lung nodule seen on imaging study   . Meniere's disease   . Obstructive sleep apnea     SURGICAL HISTORY: Past Surgical History:  Procedure Laterality Date  . ABDOMINAL HYSTERECTOMY  1995  . ANAL RECTAL MANOMETRY N/A 08/06/2016   Procedure: ANO RECTAL MANOMETRY;  Surgeon: Doran Stabler, MD;  Location: [facility];  Service: Gastroenterology;  Laterality: N/A;  . APPENDECTOMY  1989  . BPPV     x 4 after L cochlear implant  . BREAST SURGERY  1989   breast reduction  . CHOLECYSTECTOMY  1989  . COCHLEAR IMPLANT     Left/2009; Right/2012  . LAPAROSCOPIC SIGMOID COLECTOMY  2012   with takedown of splenic flexure; cystoscopy with bilateral ureteral stent placement  . REDUCTION MAMMAPLASTY Bilateral 1989   [place]  . TOTAL THYROIDECTOMY  1995    SOCIAL HISTORY: Social History   Socioeconomic History  . Marital status: Divorced    Spouse name: Not on file  . Number of children: 3  . Years of education: Not on file  . Highest education level: Not on file  Occupational History  . Occupation: retired Marine scientist  Social Needs  . Financial resource strain: Not on file  . Food insecurity:    Worry: Not on file    Inability: Not on file  . Transportation needs:    Medical: Not on file    Non-medical: Not on file  Tobacco Use  . Smoking status: Current Every Day Smoker    Packs/day: 0.50    Years: 41.00    Pack years: 20.50    Types: Cigarettes    Last attempt to quit: 01/18/2016    Years since quitting: 3.1  . Smokeless tobacco: Never Used  Substance and Sexual Activity  . Alcohol use: Yes    Alcohol/week: 0.0 standard drinks    Comment: rarely  . Drug use: No  . Sexual activity: Never  Lifestyle  . Physical  activity:    Days per week: Not on file    Minutes per session: Not on file  . Stress: Not on file  Relationships  . Social connections:    Talks on phone: Not on file    Gets together: Not on file    Attends religious service: Not on file    Active member of club or organization: Not on file    Attends meetings of clubs or organizations: Not on file    Relationship status: Not on file  . Intimate partner violence:    Fear of current or ex partner: Not on file    Emotionally abused: Not on file    Physically abused: Not on file    Forced sexual activity: Not on file  Other Topics Concern  . Not on file  Social History Narrative   Regular exercise: walk; 1 mile a day / walk 5 miles  weekend;    Caffeine use: 1 cup of coffee in the AM      3 children       FAMILY HISTORY: Family History  Problem Relation Age of Onset  . Hypertension Mother   . Hypertension Father   . Hypertension Sister   . Prostate cancer Brother   . Hyperlipidemia Brother   . Colon cancer Maternal Aunt   . Prostate cancer Paternal Uncle   . Colon cancer Cousin        x 2; paternal  . Thyroid disease Sister     ALLERGIES:  is allergic to morphine and related; statins; tape; and wellbutrin [bupropion].  MEDICATIONS:  Current Outpatient Medications  Medication Sig Dispense Refill  . ascorbic acid (VITAMIN C) 1000 MG tablet Take 500 mg by mouth daily.     Marland Kitchen aspirin 81 MG chewable tablet Chew 81 mg by mouth daily.     . bimatoprost (LUMIGAN) 0.01 % SOLN Place 1 drop into both eyes at bedtime.    . Calcium Carb-Cholecalciferol (CALCIUM 500 + D3) 500-600 MG-UNIT TABS Take 1 Dose by mouth daily.     Marland Kitchen doxycycline (VIBRA-TABS) 100 MG tablet Take 1 tablet (100 mg total) by mouth 2 (two) times daily. 14 tablet 0  . fluticasone (FLONASE) 50 MCG/ACT nasal spray Place 2 sprays into both nostrils at bedtime.    Marland Kitchen levothyroxine (SYNTHROID, LEVOTHROID) 125 MCG tablet Take 1 tablet (125 mcg total) by mouth daily  before breakfast. 90 tablet 3  . losartan-hydrochlorothiazide (HYZAAR) 100-25 MG per tablet Take 1 tablet by mouth daily.     . meclizine (ANTIVERT) 25 MG tablet Take 1 tablet (25 mg total) by mouth 3 (three) times daily as needed for dizziness. 90 tablet 4  . neomycin-polymyxin-hydrocortisone (CORTISPORIN) OTIC solution Apply 1-2 drops to toe after soaking twice a day 10 mL 0  . Omega-3 Fatty Acids (FISH OIL PO) Take 1 tablet by mouth daily.    Marland Kitchen oxybutynin (DITROPAN) 5 MG tablet Take 1 tablet (5 mg total) by mouth 2 (two) times daily as needed for bladder spasms. 60 tablet 3  . pantoprazole (PROTONIX) 40 MG tablet TAKE 1 TABLET(40 MG) BY MOUTH TWICE DAILY BEFORE A MEAL 180 tablet 0   No current facility-administered medications for this visit.     REVIEW OF SYSTEMS:    10 Point review of Systems was done is negative except as noted above.  PHYSICAL EXAMINATION:  . Vitals:   03/15/19 1018  BP: 118/73  Pulse: 91  Resp: 18  Temp: 97.8 F (36.6 C)  SpO2: 97%   Filed Weights   03/15/19 1018  Weight: 136 lb 11.2 oz (62 kg)   .Body mass index is 25.83 kg/m.  GENERAL:alert, in no acute distress and comfortable SKIN: no acute rashes, no significant lesions EYES: conjunctiva are pink and non-injected, sclera anicteric OROPHARYNX: MMM, no exudates, no oropharyngeal erythema or ulceration NECK: supple, no JVD LYMPH:  no palpable lymphadenopathy in the cervical, axillary or inguinal regions LUNGS: clear to auscultation b/l with normal respiratory effort HEART: regular rate & rhythm ABDOMEN:  normoactive bowel sounds , non tender, not distended. Extremity: no pedal edema PSYCH: alert & oriented x 3 with fluent speech NEURO: no focal motor/sensory deficits  LABORATORY DATA:  I have reviewed the data as listed  . CBC Latest Ref Rng & Units 02/07/2019 11/30/2018 10/11/2018  WBC 4.0 - 10.5 K/uL 10.0 10.8(H) 8.8  Hemoglobin 12.0 - 15.0 g/dL 15.2(H) 15.1(H) 15.0  Hematocrit  36.0 -  46.0 % 44.5 44.5 45.2  Platelets 150.0 - 400.0 K/uL 221.0 213.0 198.0    . CMP Latest Ref Rng & Units 02/07/2019 11/30/2018 10/11/2018  Glucose 70 - 99 mg/dL 113(H) 93 95  BUN 6 - 23 mg/dL 34(H) 21 18  Creatinine 0.40 - 1.20 mg/dL 0.90 0.99 0.79  Sodium 135 - 145 mEq/L 142 139 142  Potassium 3.5 - 5.1 mEq/L 3.3(L) 3.5 3.5  Chloride 96 - 112 mEq/L 105 102 103  CO2 19 - 32 mEq/L 27 31 33(H)  Calcium 8.4 - 10.5 mg/dL 9.8 9.6 9.6  Total Protein 6.0 - 8.3 g/dL 7.4 7.7 7.3  Total Bilirubin 0.2 - 1.2 mg/dL 0.7 0.5 0.5  Alkaline Phos 39 - 117 U/L 84 91 87  AST 0 - 37 U/L 15 18 14   ALT 0 - 35 U/L 15 16 15      RADIOGRAPHIC STUDIES: I have personally reviewed the radiological images as listed and agreed with the findings in the report. No results found.  ASSESSMENT & PLAN:   73 y.o. female with  1. Lymphadenopathy - Rt retrocrural and borderline mesenteric LN PLAN -Discussed patient's most recent labs from 02/07/19, WBC normal at 10.0k, PLT normal at 221k, HGB at 15.2. Some lymphocytosis at 4.1k seen on 11/30/18 Diff. -Discussed the 01/24/19 CT A/P which revealed Diverticulosis throughout the colon most notable sigmoid colon where patient has had prior surgery. No extraluminal inflammation to suggest diverticulitis. No obvious gastric or small bowel Abnormality. 2. 1.2 x 1.4 cm low-density right retrocrural lymph node (series 2, image 12). This measured 1.2 x 0.8 cm on 01/03/2015 chest CT. Mesenteric top-normal size lymph nodes with 1 lymph node having slightly rounded appearance and short axis dimension of 1 cm (series 5, image 29). Etiology of these lymph nodes is indeterminate. If there are progressive symptoms, follow-up CT within the next 6 months may be considered to confirm stability or resolution of these lymph nodes. 3. Bilateral renal low-density structures, larger ones which are cysts, smaller ones too small to characterize although statistically likely cysts. 4.  Aortic Atherosclerosis.  5. Post hysterectomy, cholecystectomy and appendectomy. 6. Degenerative changes lumbar spine, sacroiliac joints, pubic symphysis and hips. -Discussed the 1/22/2 CT Chest which revealed Stable 4.1 cm ectatic ascending thoracic aorta. Recommend annual imaging followup by CTA or MRA. 2. Pulmonary nodules are stable since 2017 chest CT, considered benign. No active pulmonary disease. 3. Three-vessel coronary atherosclerosis. Aortic Atherosclerosis.  -Discussed that the patient's noted lymph nodes are borderline enlarged and are not expected to cause pain or other symptoms -Chronic inflammation vs post-surgical change vs smoking -Given the reassuring stability and absence of overt progression of her similar appearing lymph nodes from 4 years ago, do not recommend a biopsy at this time. The noted lymph nodes are not easily accessible and would not recommend surgical intervention at this time. -Will repeat CT A/P in 6 months to monitor further -If lymph nodes are seen to change in 6 months, would then consider a biopsy -No CT evidence ofprimary tumor, nor splenomegaly which would suggest an obvious malignancy -Counseled the pt towards complete smoking cessation and discussed that this could cause some inflammation and lymphadenopathy -Continue annual CT Chest with pulmonology for pulmonary nodules and aortic aneurysm -Will order labs today- flow cytometry - no monoclonalB or T cell lymphocyte population noted. -Will see the pt back in 6 months   Labs today CT abd/pelvis in 24 weeks with labs RTC with Dr Irene Limbo in 25  weeks   All of the patients questions were answered with apparent satisfaction. The patient knows to call the clinic with any problems, questions or concerns.  The total time spent in the appt was 45 minutes and more than 50% was on counseling and direct patient cares.    Sullivan Lone MD MS AAHIVMS [facility] Hematology/Oncology Physician [facility]  (Office):        [phone] (Work cell):  [phone] (Fax):           [phone]  03/15/2019 11:11 AM  I, Baldwin Jamaica, am acting as a scribe for Dr. Sullivan Lone.   .I have reviewed the above documentation for accuracy and completeness, and I agree with the above. Brunetta Genera MD

## 2019-03-15 ENCOUNTER — Inpatient Hospital Stay: Payer: Medicare Other | Attending: Hematology | Admitting: Hematology

## 2019-03-15 ENCOUNTER — Inpatient Hospital Stay: Payer: Medicare Other

## 2019-03-15 ENCOUNTER — Other Ambulatory Visit: Payer: Self-pay

## 2019-03-15 ENCOUNTER — Telehealth: Payer: Self-pay | Admitting: Hematology

## 2019-03-15 VITALS — BP 118/73 | HR 91 | Temp 97.8°F | Resp 18 | Ht 61.0 in | Wt 136.7 lb

## 2019-03-15 DIAGNOSIS — I2510 Atherosclerotic heart disease of native coronary artery without angina pectoris: Secondary | ICD-10-CM | POA: Diagnosis not present

## 2019-03-15 DIAGNOSIS — R590 Localized enlarged lymph nodes: Secondary | ICD-10-CM

## 2019-03-15 DIAGNOSIS — J310 Chronic rhinitis: Secondary | ICD-10-CM | POA: Diagnosis not present

## 2019-03-15 DIAGNOSIS — K5730 Diverticulosis of large intestine without perforation or abscess without bleeding: Secondary | ICD-10-CM | POA: Diagnosis not present

## 2019-03-15 DIAGNOSIS — J342 Deviated nasal septum: Secondary | ICD-10-CM | POA: Diagnosis not present

## 2019-03-15 DIAGNOSIS — J343 Hypertrophy of nasal turbinates: Secondary | ICD-10-CM | POA: Diagnosis not present

## 2019-03-15 DIAGNOSIS — R918 Other nonspecific abnormal finding of lung field: Secondary | ICD-10-CM

## 2019-03-15 DIAGNOSIS — R599 Enlarged lymph nodes, unspecified: Secondary | ICD-10-CM

## 2019-03-15 DIAGNOSIS — R591 Generalized enlarged lymph nodes: Secondary | ICD-10-CM | POA: Diagnosis not present

## 2019-03-15 LAB — CMP (CANCER CENTER ONLY)
ALT: 16 U/L (ref 0–44)
ANION GAP: 10 (ref 5–15)
AST: 16 U/L (ref 15–41)
Albumin: 3.6 g/dL (ref 3.5–5.0)
Alkaline Phosphatase: 86 U/L (ref 38–126)
BUN: 25 mg/dL — ABNORMAL HIGH (ref 8–23)
CO2: 24 mmol/L (ref 22–32)
Calcium: 9.3 mg/dL (ref 8.9–10.3)
Chloride: 108 mmol/L (ref 98–111)
Creatinine: 0.89 mg/dL (ref 0.44–1.00)
GFR, Est AFR Am: >60 mL/min (ref >60)
GFR, Estimated: >60 mL/min (ref >60)
Glucose, Bld: 90 mg/dL (ref 70–99)
POTASSIUM: 3.6 mmol/L (ref 3.5–5.1)
Sodium: 142 mmol/L (ref 135–145)
Total Bilirubin: 0.6 mg/dL (ref 0.3–1.2)
Total Protein: 7.5 g/dL (ref 6.5–8.1)

## 2019-03-15 LAB — CBC WITH DIFFERENTIAL/PLATELET
Abs Immature Granulocytes: 0.03 10*3/uL (ref 0.00–0.07)
BASOS ABS: 0 10*3/uL (ref 0.0–0.1)
Basophils Relative: 0 %
Eosinophils Absolute: 0.2 10*3/uL (ref 0.0–0.5)
Eosinophils Relative: 2 %
HCT: 43 % (ref 36.0–46.0)
Hemoglobin: 14.4 g/dL (ref 12.0–15.0)
IMMATURE GRANULOCYTES: 0 %
Lymphocytes Relative: 34 %
Lymphs Abs: 3.3 10*3/uL (ref 0.7–4.0)
MCH: 29.3 pg (ref 26.0–34.0)
MCHC: 33.5 g/dL (ref 30.0–36.0)
MCV: 87.4 fL (ref 80.0–100.0)
Monocytes Absolute: 0.8 10*3/uL (ref 0.1–1.0)
Monocytes Relative: 9 %
Neutro Abs: 5.2 10*3/uL (ref 1.7–7.7)
Neutrophils Relative %: 55 %
Platelets: 193 10*3/uL (ref 150–400)
RBC: 4.92 MIL/uL (ref 3.87–5.11)
RDW: 12.5 % (ref 11.5–15.5)
WBC: 9.5 10*3/uL (ref 4.0–10.5)
nRBC: 0 % (ref 0.0–0.2)

## 2019-03-15 LAB — LACTATE DEHYDROGENASE: LDH: 179 U/L (ref 98–192)

## 2019-03-15 NOTE — Telephone Encounter (Signed)
Printed avs and calender for patient. Per 03/17 los.   Scheduled appt per patient going out of town the week she was suppose to come back.

## 2019-03-17 LAB — FLOW CYTOMETRY

## 2019-03-22 ENCOUNTER — Encounter: Payer: Self-pay | Admitting: Hematology

## 2019-03-23 ENCOUNTER — Telehealth: Payer: Self-pay | Admitting: *Deleted

## 2019-03-23 NOTE — Telephone Encounter (Signed)
Spoke with son re: my chart question that patient had. Patient Rachel Gardner

## 2019-03-31 ENCOUNTER — Encounter: Payer: Self-pay | Admitting: Hematology

## 2019-04-01 ENCOUNTER — Telehealth: Payer: Self-pay | Admitting: *Deleted

## 2019-04-01 NOTE — Telephone Encounter (Signed)
Contacted patient in response to MyChart question regarding test results. Son [name] took call - states patient/mother is hard of hearing, especially on the phone. Per Dr. Irene Limbo: Flow cytometry results WNL, all other lab tests essentially same/nothing concerning. Continue current plan of care and f/u in 6 months. [name] verbalized understanding and states he will tell his mother.

## 2019-04-10 DIAGNOSIS — G4733 Obstructive sleep apnea (adult) (pediatric): Secondary | ICD-10-CM | POA: Diagnosis not present

## 2019-04-12 ENCOUNTER — Telehealth: Payer: Self-pay

## 2019-04-12 NOTE — Progress Notes (Signed)
Virtual Visit via Video Note   This visit type was conducted due to national recommendations for restrictions regarding the COVID-19 Pandemic (e.g. social distancing) in an effort to limit this patient's exposure and mitigate transmission in our community.  Due to her co-morbid illnesses, this patient is at least at moderate risk for complications without adequate follow up.  This format is felt to be most appropriate for this patient at this time.  All issues noted in this document were discussed and addressed.  A limited physical exam was performed with this format.  Please refer to the patient's chart for her consent to telehealth for [facility].  Evaluation Performed:  Follow-up visit  This visit type was conducted due to national recommendations for restrictions regarding the COVID-19 Pandemic (e.g. social distancing).  This format is felt to be most appropriate for this patient at this time.  All issues noted in this document were discussed and addressed.  No physical exam was performed (except for noted visual exam findings with Video Visits).  Please refer to the patient's chart (MyChart message for video visits and phone note for telephone visits) for the patient's consent to telehealth for [facility].  Date:  04/13/2019   ID:  Rachel, Gardner 12-07-1946, MRN [id number]  Patient Location:  Home  Provider location:   [place]  PCP:  Hoyt Koch, MD  Cardiologist:  Candee Furbish, MD Sleep Medicine:  Fransico Him, MD Electrophysiologist:  None   Chief Complaint:  OSa  History of Present Illness:    Rachel Gardner is a 73 y.o. female who presents via audio/video conferencing for a telehealth visit today.    This is a 73yo female with a history of ascending aortic aneurysm, HTN and hyperlipidemia who was referred for sleep study by Dr. Marlou Porch due to snoring.  She was found to have moderate OSA with an AHI of 20.5/hr and O2 desats as low as 80%.  She underwent  CPAP titration to 11cm H2O.  She is doing well with her CPAP device and thinks that she has gotten used to it. She tolerates the mask and feels the pressure is adequate.  Since going on CPAP she feels rested in the am and has no significant daytime sleepiness.  She denies any significant mouth or nasal dryness or nasal congestion.  She does not think that he snores.    The patient does not have symptoms concerning for COVID-19 infection (fever, chills, cough, or new shortness of breath).    Prior CV studies:   The following studies were reviewed today:  Sleep study and CPAP titration  Past Medical History:  Diagnosis Date  . Allergy    hay fever  . Ascending aortic aneurysm ([place])   . Blood in stool   . BPPV (benign paroxysmal positional vertigo)   . CPAP (continuous positive airway pressure) dependence 02/09/2019  . Diverticulitis   . Fatty liver 05/28/10   per CT at [facility]  . Genital herpes   . GERD (gastroesophageal reflux disease)   . Glaucoma   . Helicobacter pylori infection   . Hiatal hernia 05/28/10   CT @ [facility]  . Hyperlipidemia   . Hypertension   . Hypothyroidism   . 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 Kidney stones   . Lung nodule seen on imaging study   . Meniere's disease   . Obstructive sleep apnea    Past Surgical History:  Procedure Laterality Date  .  ABDOMINAL HYSTERECTOMY  1995  . ANAL RECTAL MANOMETRY N/A 08/06/2016   Procedure: ANO RECTAL MANOMETRY;  Surgeon: Doran Stabler, MD;  Location: [facility];  Service: Gastroenterology;  Laterality: N/A;  . APPENDECTOMY  1989  . BPPV     x 4 after L cochlear implant  . BREAST SURGERY  1989   breast reduction  . CHOLECYSTECTOMY  1989  . COCHLEAR IMPLANT     Left/2009; Right/2012  . LAPAROSCOPIC SIGMOID COLECTOMY  2012   with takedown of splenic flexure; cystoscopy with bilateral ureteral stent placement  . REDUCTION MAMMAPLASTY Bilateral 1989   [place]  . TOTAL THYROIDECTOMY   1995     Current Meds  Medication Sig  . ascorbic acid (VITAMIN C) 1000 MG tablet Take 500 mg by mouth daily.   Marland Kitchen aspirin 81 MG chewable tablet Chew 81 mg by mouth daily.   . bimatoprost (LUMIGAN) 0.01 % SOLN Place 1 drop into both eyes at bedtime.  . Calcium Carb-Cholecalciferol (CALCIUM 500 + D3) 500-600 MG-UNIT TABS Take 1 Dose by mouth daily.   . fluticasone (FLONASE) 50 MCG/ACT nasal spray Place 2 sprays into both nostrils at bedtime.  Marland Kitchen levothyroxine (SYNTHROID, LEVOTHROID) 125 MCG tablet Take 1 tablet (125 mcg total) by mouth daily before breakfast.  . losartan-hydrochlorothiazide (HYZAAR) 100-25 MG per tablet Take 1 tablet by mouth daily.   . meclizine (ANTIVERT) 25 MG tablet Take 1 tablet (25 mg total) by mouth 3 (three) times daily as needed for dizziness.  . Omega-3 Fatty Acids (FISH OIL PO) Take 1 tablet by mouth daily.  . Probiotic Product (PROBIOTIC ADVANCED PO) Take 1 capsule by mouth daily.     Allergies:   Morphine and related; Statins; Tape; and Wellbutrin [bupropion]   Social History   Tobacco Use  . Smoking status: Current Every Day Smoker    Packs/day: 0.50    Years: 41.00    Pack years: 20.50    Types: Cigarettes    Last attempt to quit: 01/18/2016    Years since quitting: 3.2  . Smokeless tobacco: Never Used  Substance Use Topics  . Alcohol use: Yes    Alcohol/week: 0.0 standard drinks    Comment: rarely  . Drug use: No     Family Hx: The patient's family history includes Colon cancer in her cousin and maternal aunt; Hyperlipidemia in her brother; Hypertension in her father, mother, and sister; Prostate cancer in her brother and paternal uncle; Thyroid disease in her sister.  ROS:   Please see the history of present illness.     All other systems reviewed and are negative.   Labs/Other Tests and Data Reviewed:    Recent Labs: 02/03/2019: TSH 0.23 03/15/2019: ALT 16; BUN 25; Creatinine 0.89; Hemoglobin 14.4; Platelets 193; Potassium 3.6; Sodium 142    Recent Lipid Panel Lab Results  Component Value Date/Time   CHOL 231 (H) 02/07/2019 09:07 AM   CHOL 222 (H) 12/16/2016 08:26 AM   TRIG 320.0 (H) 02/07/2019 09:07 AM   HDL 34.30 (L) 02/07/2019 09:07 AM   HDL 48 12/16/2016 08:26 AM   CHOLHDL 7 02/07/2019 09:07 AM   LDLCALC 119 (H) 02/18/2018 07:40 AM   LDLCALC 138 (H) 12/16/2016 08:26 AM   LDLDIRECT 166.0 02/07/2019 09:07 AM    Wt Readings from Last 3 Encounters:  04/13/19 137 lb (62.1 kg)  03/15/19 136 lb 11.2 oz (62 kg)  02/07/19 134 lb (60.8 kg)     Objective:    Vital Signs:  BP 121/87   Pulse 78   Ht 5\' 1"  (1.549 m)   Wt 137 lb (62.1 kg)   BMI 25.89 kg/m    CONSTITUTIONAL:  Well nourished, well developed female in no acute distress.  EYES: anicteric MOUTH: oral mucosa is pink RESPIRATORY: Normal respiratory effort, symmetric expansion CARDIOVASCULAR: No peripheral edema SKIN: No rash, lesions or ulcers MUSCULOSKELETAL: no digital cyanosis NEURO: Cranial Nerves II-XII grossly intact, moves all extremities PSYCH: Intact judgement and insight.  A&O x 3, Mood/affect appropriate   ASSESSMENT & PLAN:    1.  OSA - the patient is tolerating PAP therapy well without any problems. The PAP download was reviewed today and showed an AHI of 1.5/hr on 11 cm H2O with 93% compliance in using more than 4 hours nightly.  The patient has been using and benefiting from PAP use and will continue to benefit from therapy.   2.  HTN - she checks her BP at home and it is controlled.  She will continue on Losartan HCT 100-25mg  daily.Her creatinine was 0.89 on 03/15/2019.    3.  COVID-19 Education:The signs and symptoms of COVID-19 were discussed with the patient and how to seek care for testing (follow up with PCP or arrange E-visit).  The importance of social distancing was discussed today.  Patient Risk:   After full review of this patient's clinical status, I feel that they are at least moderate risk at this time.  Time:   Today, I  have spent 25 minutes with the patient reviewing chart and discussing medical problems including OSA, HTN and reviewing symptoms of COVID 19 and the ways to protect against contracting the virus with telehealth technology.      Medication Adjustments/Labs and Tests Ordered: Current medicines are reviewed at length with the patient today.  Concerns regarding medicines are outlined above.  Tests Ordered: No orders of the defined types were placed in this encounter.  Medication Changes: No orders of the defined types were placed in this encounter.   Disposition:  Follow up in 1 year(s)  Signed, Fransico Him, MD  04/13/2019 2:22 PM    [facility]

## 2019-04-12 NOTE — Telephone Encounter (Signed)
Called phone number listed in chart and it was patient's son who is listed on her DPR. He went to patient who lives next door to speak with me and she gave verbal consent along with son to do video visit tomorrow 04/13/2019 at 2pm.    TELEPHONE CALL NOTE  This patient has been deemed a candidate for follow-up tele-health visit to limit community exposure during the Covid-19 pandemic. I spoke with the patient via phone to discuss instructions. This has been outlined on the patient's AVS (dotphrase: [email or web address]). The patient was advised to review the section on consent for treatment as well. The patient will receive a phone call 2-3 days prior to their E-Visit at which time consent will be verbally confirmed. A Virtual Office Visit appointment type has been scheduled for 04/13/2019 at 2pm with Dr Radford Pax, with "VIDEO" or "TELEPHONE" in the appointment notes - patient prefers VIDEO type.  I have either confirmed the patient is active in MyChart or offered to send sign-up link to phone/email via Mychart icon beside patient's photo. ACTIVE  Rachel Gardner, [facility] 04/12/2019 2:27 PM      Virtual Visit Pre-Appointment Phone Call  TELEPHONE CALL NOTE  Rachel Gardner has been deemed a candidate for a follow-up tele-health visit to limit community exposure during the Covid-19 pandemic. I spoke with the patient via phone to ensure availability of phone/video source, confirm preferred email & phone number, and discuss instructions and expectations.  I reminded Rachel Gardner to be prepared with any vital sign and/or heart rhythm information that could potentially be obtained via home monitoring, at the time of her visit. I reminded Rachel Gardner to expect a phone call at the time of her visit if her visit.  Rachel Gardner, [place] 04/12/2019 2:28 PM   DOWNLOADING THE WEBEX APP TO SMARTPHONE  - If Apple, ask patient to go to App Store and type in WebEx in the search bar. [place] [employer], the  blue/green circle. If Android, go to [employer] and type in [employer] in the search bar. The app is free but as with any other app downloads, their phone may require them to verify saved payment information or Apple/Android password.  - The patient does NOT have to create an account. - On the day of the visit, the assist will walk the patient through joining the meeting with the meeting number/password.  DOWNLOADING THE MYCHART APP TO SMARTPHONE  - If Apple, go to [employer] and type in MyChart in the search bar and download the app. If Android, ask patient to go to [employer] and type in [place] in the search bar and download the app. The app is free but as with any other app downloads, their phone may require them to verify saved payment information or Apple/Android password.  - The patient will need to then log into the app with their MyChart username and password, and select [place] as their healthcare provider to link the account. When it is time for your visit, go to the MyChart app, find appointments, and click Begin Video Visit. Be sure to Select Allow for your device to access the Microphone and Camera for your visit. You will then be connected, and your provider will be with you shortly.  **If they have any issues connecting, or need assistance please contact [place] [phone])**  **If using a computer, in order to ensure the best quality for your visit they will need to use  either of the following Internet Browsers: Manufacturing engineer, or Google Chrome**  [place] VISIT   I hereby voluntarily request, consent and authorize [facility] and its employed or contracted physicians, physician assistants, nurse practitioners or other licensed health care professionals (the Practitioner), to provide me with telemedicine health care services (the "Services") as deemed necessary by the treating Practitioner. I acknowledge and  consent to receive the Services by the Practitioner via telemedicine. I understand that the telemedicine visit will involve communicating with the Practitioner through live audiovisual communication technology and the disclosure of certain medical information by electronic transmission. I acknowledge that I have been given the opportunity to request an in-person assessment or other available alternative prior to the telemedicine visit and am voluntarily participating in the telemedicine visit.  I understand that I have the right to withhold or withdraw my consent to the use of telemedicine in the course of my care at any time, without affecting my right to future care or treatment, and that the Practitioner or I may terminate the telemedicine visit at any time. I understand that I have the right to inspect all information obtained and/or recorded in the course of the telemedicine visit and may receive copies of available information for a reasonable fee.  I understand that some of the potential risks of receiving the Services via telemedicine include:  Marland Kitchen Delay or interruption in medical evaluation due to technological equipment failure or disruption; . Information transmitted may not be sufficient (e.g. poor resolution of images) to allow for appropriate medical decision making by the Practitioner; and/or  . In rare instances, security protocols could fail, causing a breach of personal health information.  Furthermore, I acknowledge that it is my responsibility to provide information about my medical history, conditions and care that is complete and accurate to the best of my ability. I acknowledge that Practitioner's advice, recommendations, and/or decision may be based on factors not within their control, such as incomplete or inaccurate data provided by me or distortions of diagnostic images or specimens that may result from electronic transmissions. I understand that the practice of medicine is not an  exact science and that Practitioner makes no warranties or guarantees regarding treatment outcomes. I acknowledge that I will receive a copy of this consent concurrently upon execution via email to the email address I last provided but may also request a printed copy by calling the office of [place].    I understand that my insurance will be billed for this visit.   I have read or had this consent read to me. . I understand the contents of this consent, which adequately explains the benefits and risks of the Services being provided via telemedicine.  . I have been provided ample opportunity to ask questions regarding this consent and the Services and have had my questions answered to my satisfaction. . I give my informed consent for the services to be provided through the use of telemedicine in my medical care  By participating in this telemedicine visit I agree to the above.

## 2019-04-13 ENCOUNTER — Encounter: Payer: Self-pay | Admitting: Cardiology

## 2019-04-13 ENCOUNTER — Telehealth (INDEPENDENT_AMBULATORY_CARE_PROVIDER_SITE_OTHER): Payer: Medicare Other | Admitting: Cardiology

## 2019-04-13 ENCOUNTER — Other Ambulatory Visit: Payer: Self-pay

## 2019-04-13 VITALS — BP 121/87 | HR 78 | Ht 61.0 in | Wt 137.0 lb

## 2019-04-13 DIAGNOSIS — Z9989 Dependence on other enabling machines and devices: Secondary | ICD-10-CM

## 2019-04-13 DIAGNOSIS — G4733 Obstructive sleep apnea (adult) (pediatric): Secondary | ICD-10-CM | POA: Diagnosis not present

## 2019-04-13 DIAGNOSIS — Z7189 Other specified counseling: Secondary | ICD-10-CM | POA: Diagnosis not present

## 2019-04-13 DIAGNOSIS — I10 Essential (primary) hypertension: Secondary | ICD-10-CM | POA: Diagnosis not present

## 2019-04-13 HISTORY — DX: Obstructive sleep apnea (adult) (pediatric): G47.33

## 2019-04-13 NOTE — Patient Instructions (Signed)

## 2019-04-14 ENCOUNTER — Telehealth: Payer: Self-pay | Admitting: *Deleted

## 2019-04-14 NOTE — Telephone Encounter (Signed)
Informed patient of compliance results and verbalized understanding was indicated. Patient is aware and agreeable to AHI being within range at 1.3. Patient is aware and agreeable to being in compliance with machine usage. Patient agrees with keeping current settings.

## 2019-04-14 NOTE — Telephone Encounter (Signed)
-----   Message from Sueanne Margarita, MD sent at 04/08/2019  4:39 PM EDT ----- Good AHI and compliance.  Continue current PAP settings.

## 2019-04-19 DIAGNOSIS — N3946 Mixed incontinence: Secondary | ICD-10-CM | POA: Diagnosis not present

## 2019-04-19 DIAGNOSIS — R3121 Asymptomatic microscopic hematuria: Secondary | ICD-10-CM | POA: Diagnosis not present

## 2019-04-21 ENCOUNTER — Ambulatory Visit: Payer: Medicare Other | Admitting: Cardiology

## 2019-05-10 DIAGNOSIS — G4733 Obstructive sleep apnea (adult) (pediatric): Secondary | ICD-10-CM | POA: Diagnosis not present

## 2019-05-26 DIAGNOSIS — G4733 Obstructive sleep apnea (adult) (pediatric): Secondary | ICD-10-CM | POA: Diagnosis not present

## 2019-08-15 ENCOUNTER — Inpatient Hospital Stay: Payer: Medicare Other | Attending: Hematology

## 2019-08-15 ENCOUNTER — Encounter (HOSPITAL_COMMUNITY): Payer: Self-pay

## 2019-08-15 ENCOUNTER — Other Ambulatory Visit: Payer: Self-pay

## 2019-08-15 ENCOUNTER — Ambulatory Visit (HOSPITAL_COMMUNITY)
Admission: RE | Admit: 2019-08-15 | Discharge: 2019-08-15 | Disposition: A | Discharge status: Home / Self Care | Payer: Medicare Other | Source: Ambulatory Visit | Attending: Hematology | Admitting: Hematology

## 2019-08-15 DIAGNOSIS — R590 Localized enlarged lymph nodes: Secondary | ICD-10-CM | POA: Insufficient documentation

## 2019-08-15 DIAGNOSIS — R918 Other nonspecific abnormal finding of lung field: Secondary | ICD-10-CM | POA: Diagnosis not present

## 2019-08-15 DIAGNOSIS — R599 Enlarged lymph nodes, unspecified: Secondary | ICD-10-CM

## 2019-08-15 DIAGNOSIS — K5730 Diverticulosis of large intestine without perforation or abscess without bleeding: Secondary | ICD-10-CM | POA: Diagnosis not present

## 2019-08-15 DIAGNOSIS — K449 Diaphragmatic hernia without obstruction or gangrene: Secondary | ICD-10-CM | POA: Diagnosis not present

## 2019-08-15 DIAGNOSIS — I719 Aortic aneurysm of unspecified site, without rupture: Secondary | ICD-10-CM | POA: Insufficient documentation

## 2019-08-15 LAB — CBC WITH DIFFERENTIAL/PLATELET
Abs Immature Granulocytes: 0.03 10*3/uL (ref 0.00–0.07)
Basophils Absolute: 0 10*3/uL (ref 0.0–0.1)
Basophils Relative: 0 %
Eosinophils Absolute: 0.2 10*3/uL (ref 0.0–0.5)
Eosinophils Relative: 2 %
HCT: 42.4 % (ref 36.0–46.0)
Hemoglobin: 14 g/dL (ref 12.0–15.0)
Immature Granulocytes: 0 %
Lymphocytes Relative: 33 %
Lymphs Abs: 3.1 10*3/uL (ref 0.7–4.0)
MCH: 29 pg (ref 26.0–34.0)
MCHC: 33 g/dL (ref 30.0–36.0)
MCV: 88 fL (ref 80.0–100.0)
Monocytes Absolute: 0.8 10*3/uL (ref 0.1–1.0)
Monocytes Relative: 9 %
Neutro Abs: 5.3 10*3/uL (ref 1.7–7.7)
Neutrophils Relative %: 56 %
Platelets: 171 10*3/uL (ref 150–400)
RBC: 4.82 MIL/uL (ref 3.87–5.11)
RDW: 12.2 % (ref 11.5–15.5)
WBC: 9.5 10*3/uL (ref 4.0–10.5)
nRBC: 0 % (ref 0.0–0.2)

## 2019-08-15 LAB — CMP (CANCER CENTER ONLY)
ALT: 14 U/L (ref 0–44)
AST: 17 U/L (ref 15–41)
Albumin: 3.5 g/dL (ref 3.5–5.0)
Alkaline Phosphatase: 77 U/L (ref 38–126)
Anion gap: 9 (ref 5–15)
BUN: 18 mg/dL (ref 8–23)
CO2: 23 mmol/L (ref 22–32)
Calcium: 8.6 mg/dL — ABNORMAL LOW (ref 8.9–10.3)
Chloride: 107 mmol/L (ref 98–111)
Creatinine: 0.89 mg/dL (ref 0.44–1.00)
GFR, Est AFR Am: >60 mL/min (ref >60)
GFR, Estimated: >60 mL/min (ref >60)
Glucose, Bld: 88 mg/dL (ref 70–99)
Potassium: 3.5 mmol/L (ref 3.5–5.1)
Sodium: 139 mmol/L (ref 135–145)
Total Bilirubin: 0.5 mg/dL (ref 0.3–1.2)
Total Protein: 6.9 g/dL (ref 6.5–8.1)

## 2019-08-15 MED ORDER — SODIUM CHLORIDE (PF) 0.9 % IJ SOLN
INTRAMUSCULAR | Status: AC
  Filled 2019-08-15: qty 50

## 2019-08-15 MED ORDER — IOHEXOL 300 MG/ML  SOLN
100.0000 mL | Freq: Once | INTRAMUSCULAR | Status: AC | PRN
  Administered 2019-08-15: 100 mL via INTRAVENOUS

## 2019-08-20 NOTE — Progress Notes (Signed)
HEMATOLOGY/ONCOLOGY CONSULTATION NOTE  Date of Service: 08/22/2019  Patient Care Team: Hoyt Koch, MD as PCP - General (Internal Medicine) Lafayette Dragon, MD (Inactive) as Consulting Physician (Gastroenterology) Suella Broad, MD as Consulting Physician (Physical Medicine and Rehabilitation) Estevan Ryder, MD as Referring Physician (Cardiology) Renato Shin, MD as Consulting Physician (Endocrinology) Vicie Mutters, MD as Consulting Physician (Otolaryngology)  CHIEF COMPLAINTS/PURPOSE OF CONSULTATION:  Lymphadenopathy  HISTORY OF PRESENTING ILLNESS:   Rachel Gardner is a wonderful 73 y.o. female who has been referred to [name] by Dr. Pricilla Holm for evaluation and management of Lymphadenopathy. The pt reports that she is doing well overall.   The pt notes that she had acid reflux problems in December, saw her PCP, and then began Protonix which resolved her acid reflux. The pt denies any problems swallowing food. She had a CT A/P in January 2020, as noted below, to follow up her abdominal pain, which she denies having anymore. She denies any fevers, chills, night sweats, unexpected weight loss, or noticing any new lumps or bumps.  The pt reports a history of repeated diverticulitis and had 12 inches of her descending colon removed. She notes that she has a cystoscopy ever 2 years for hematuria in the past and denies any previous abnormality found, or explanation of her hematuria. She will be having an additional cystoscopy on 03/24/19. The pt has had her thyroid, uterus, ovaries, gallbladder and appendix removed.  The pt has a CT Chest annually to monitor her aortic aneurysm and pulmonary nodules.   The pt notes that her sleep apnea has been stable.   The pt notes that she smokes about 1-1.5 packs per week, up to 2 packs.  Of note prior to the patient's visit today, pt has had a CT A/P completed on 01/24/19 with results revealing Diverticulosis throughout the colon most  notable sigmoid colon where patient has had prior surgery. No extraluminal inflammation to suggest diverticulitis. No obvious gastric or small bowel Abnormality. 2. 1.2 x 1.4 cm low-density right retrocrural lymph node (series 2, image 12). This measured 1.2 x 0.8 cm on 01/03/2015 chest CT. Mesenteric top-normal size lymph nodes with 1 lymph node having slightly rounded appearance and short axis dimension of 1 cm (series 5, image 29). Etiology of these lymph nodes is indeterminate. If there are progressive symptoms, follow-up CT within the next 6 months may be considered to confirm stability or resolution of these lymph nodes. 3. Bilateral renal low-density structures, larger ones which are cysts, smaller ones too small to characterize although statistically likely cysts. 4.  Aortic Atherosclerosis. 5. Post hysterectomy, cholecystectomy and appendectomy. 6. Degenerative changes lumbar spine, sacroiliac joints, pubic symphysis and hips.  Most recent lab results (02/07/19) of CBC and CMP is as follows: all values are WNL except for RBC at 5.16, HGB at 15.2, Potassium at 3.3, Glucose at 113, BUN at 34.  On review of systems, pt reports good energy levels, eating well, and denies problems swallowing food, noticing any new lumps or bumps, fevers, chills, night sweats, unexpected weight loss, and any other symptoms.   On Social Hx the pt reports that she worked as a Marine scientist in the past. Originally came to [place] from [place] in 1979. She smokes up to 2 packs of cigarettes per week. On Family Hx the pt reports brother with prostate cancer and denies other cancer.  INTERVAL HISTORY:  Rachel Gardner is a wonderful 73 y.o. female who is here today for the evaluation  and management of her Lymphadenopathy. The patient's last visit with [name] was on 03/15/2019. The pt reports that she is doing well overall.  The pt reports that she has had poor bowel control since her resection and in an emergency she is unable to make it to  the restroom. She has gone to see a pulmonologist as well to monitor her lung nodules.   Of note since the patient's last visit, pt has had CT Abdomen/Pelvis completed on 08/15/2019 with results revealing "1. No abdominopelvic lymphadenopathy. Previously described top-normal size central mesenteric lymph node is stable. Previously described small right retrocrural structure is fluid density and stable and compatible with a benign lymphatic structure. 2. No acute abnormality. 3. Marked diffuse colonic diverticulosis. 4. Small hiatal hernia. 5.  Aortic Atherosclerosis (ICD10-I70.0)."  Lab results (08/15/19) of CBC w/diff and CMP is as follows: all values are WNL except for Calcium at 8.6.   On review of systems, pt denies and abdominal pain any other symptoms.    MEDICAL HISTORY:  Past Medical History:  Diagnosis Date   Allergy    hay fever   Ascending aortic aneurysm (HCC)    Blood in stool    BPPV (benign paroxysmal positional vertigo)    CPAP (continuous positive airway pressure) dependence 02/09/2019   Diverticulitis    Fatty liver 05/28/10   per CT at [facility]   Genital herpes    GERD (gastroesophageal reflux disease)    Glaucoma    Helicobacter pylori infection    Hiatal hernia 05/28/10   CT @ [facility]   Hyperlipidemia    Hypertension    Hypothyroidism    Incontinence of urine in female    Kidney stones    Lung nodule seen on imaging study    Meniere's disease    Obstructive sleep apnea    OSA on CPAP 04/13/2019   Moderate with AHI 20/hr now on CPAP at 13cm H2O    SURGICAL HISTORY: Past Surgical History:  Procedure Laterality Date   ABDOMINAL HYSTERECTOMY  1995   ANAL RECTAL MANOMETRY N/A 08/06/2016   Procedure: [place];  Surgeon: Doran Stabler, MD;  Location: [facility];  Service: Gastroenterology;  Laterality: N/A;   APPENDECTOMY  1989   BPPV     x 4 after L cochlear implant   BREAST SURGERY  1989     breast reduction   CHOLECYSTECTOMY  1989   COCHLEAR IMPLANT     Left/2009; Right/2012   LAPAROSCOPIC SIGMOID COLECTOMY  2012   with takedown of splenic flexure; cystoscopy with bilateral ureteral stent placement   REDUCTION MAMMAPLASTY Bilateral 1989   [place]   TOTAL THYROIDECTOMY  1995    SOCIAL HISTORY: Social History   Socioeconomic History   Marital status: Divorced    Spouse name: Not on file   Number of children: 3   Years of education: Not on file   Highest education level: Not on file  Occupational History   Occupation: retired Editor, commissioning strain: Not on file   Food insecurity    Worry: Not on file    Inability: Not on file   Transportation needs    Medical: Not on file    Non-medical: Not on file  Tobacco Use   Smoking status: Current Every Day Smoker    Packs/day: 0.50    Years: 41.00    Pack years: 20.50    Types: Cigarettes    Last attempt  to quit: 01/18/2016    Years since quitting: 3.5   Smokeless tobacco: Never Used  Substance and Sexual Activity   Alcohol use: Yes    Alcohol/week: 0.0 standard drinks    Comment: rarely   Drug use: No   Sexual activity: Never  Lifestyle   Physical activity    Days per week: Not on file    Minutes per session: Not on file   Stress: Not on file  Relationships   Social connections    Talks on phone: Not on file    Gets together: Not on file    Attends religious service: Not on file    Active member of club or organization: Not on file    Attends meetings of clubs or organizations: Not on file    Relationship status: Not on file   Intimate partner violence    Fear of current or ex partner: Not on file    Emotionally abused: Not on file    Physically abused: Not on file    Forced sexual activity: Not on file  Other Topics Concern   Not on file  Social History Narrative   Regular exercise: walk; 1 mile a day / walk 5 miles weekend;    Caffeine use: 1 cup  of coffee in the AM      3 children       FAMILY HISTORY: Family History  Problem Relation Age of Onset   Hypertension Mother    Hypertension Father    Hypertension Sister    Prostate cancer Brother    Hyperlipidemia Brother    Colon cancer Maternal Aunt    Prostate cancer Paternal Uncle    Colon cancer Cousin        x 2; paternal   Thyroid disease Sister     ALLERGIES:  is allergic to morphine and related; statins; tape; and wellbutrin [bupropion].  MEDICATIONS:  Current Outpatient Medications  Medication Sig Dispense Refill   ascorbic acid (VITAMIN C) 1000 MG tablet Take 500 mg by mouth daily.      aspirin 81 MG chewable tablet Chew 81 mg by mouth daily.      bimatoprost (LUMIGAN) 0.01 % SOLN Place 1 drop into both eyes at bedtime.     Calcium Carb-Cholecalciferol (CALCIUM 500 + D3) 500-600 MG-UNIT TABS Take 1 Dose by mouth daily.      fluticasone (FLONASE) 50 MCG/ACT nasal spray Place 2 sprays into both nostrils at bedtime.     levothyroxine (SYNTHROID, LEVOTHROID) 125 MCG tablet Take 1 tablet (125 mcg total) by mouth daily before breakfast. 90 tablet 3   losartan-hydrochlorothiazide (HYZAAR) 100-25 MG per tablet Take 1 tablet by mouth daily.      meclizine (ANTIVERT) 25 MG tablet Take 1 tablet (25 mg total) by mouth 3 (three) times daily as needed for dizziness. 90 tablet 4   Omega-3 Fatty Acids (FISH OIL PO) Take 1 tablet by mouth daily.     Probiotic Product (PROBIOTIC ADVANCED PO) Take 1 capsule by mouth daily.     No current facility-administered medications for this visit.     REVIEW OF SYSTEMS:    A 10+ POINT REVIEW OF SYSTEMS WAS OBTAINED including neurology, dermatology, psychiatry, cardiac, respiratory, lymph, extremities, GI, GU, Musculoskeletal, constitutional, breasts, reproductive, HEENT.  All pertinent positives are noted in the HPI.  All others are negative.   PHYSICAL EXAMINATION:  . Vitals:   08/22/19 0930  BP: 130/79  Pulse:  82  Resp: 18  Temp: 99.1  F (37.3 C)  SpO2: 98%   Filed Weights   08/22/19 0930  Weight: 135 lb 11.2 oz (61.6 kg)   .Body mass index is 25.64 kg/m.  GENERAL:alert, in no acute distress and comfortable SKIN: no acute rashes, no significant lesions EYES: conjunctiva are pink and non-injected, sclera anicteric OROPHARYNX: MMM, no exudates, no oropharyngeal erythema or ulceration NECK: supple, no JVD LYMPH:  no palpable lymphadenopathy in the cervical, axillary or inguinal regions LUNGS: clear to auscultation b/l with normal respiratory effort HEART: regular rate & rhythm ABDOMEN:  normoactive bowel sounds , non tender, not distended. Extremity: no pedal edema PSYCH: alert & oriented x 3 with fluent speech NEURO: no focal motor/sensory deficits  LABORATORY DATA:  I have reviewed the data as listed  . CBC Latest Ref Rng & Units 08/15/2019 03/15/2019 02/07/2019  WBC 4.0 - 10.5 K/uL 9.5 9.5 10.0  Hemoglobin 12.0 - 15.0 g/dL 14.0 14.4 15.2(H)  Hematocrit 36.0 - 46.0 % 42.4 43.0 44.5  Platelets 150 - 400 K/uL 171 193 221.0    . CMP Latest Ref Rng & Units 08/15/2019 03/15/2019 02/07/2019  Glucose 70 - 99 mg/dL 88 90 113(H)  BUN 8 - 23 mg/dL 18 25(H) 34(H)  Creatinine 0.44 - 1.00 mg/dL 0.89 0.89 0.90  Sodium 135 - 145 mmol/L 139 142 142  Potassium 3.5 - 5.1 mmol/L 3.5 3.6 3.3(L)  Chloride 98 - 111 mmol/L 107 108 105  CO2 22 - 32 mmol/L 23 24 27   Calcium 8.9 - 10.3 mg/dL 8.6(L) 9.3 9.8  Total Protein 6.5 - 8.1 g/dL 6.9 7.5 7.4  Total Bilirubin 0.3 - 1.2 mg/dL 0.5 0.6 0.7  Alkaline Phos 38 - 126 U/L 77 86 84  AST 15 - 41 U/L 17 16 15   ALT 0 - 44 U/L 14 16 15      RADIOGRAPHIC STUDIES: I have personally reviewed the radiological images as listed and agreed with the findings in the report. Ct Abdomen Pelvis W Contrast  Result Date: 08/15/2019 CLINICAL DATA:  Follow-up abdominal adenopathy. No current symptoms reported. EXAM: CT ABDOMEN AND PELVIS WITH CONTRAST TECHNIQUE:  Multidetector CT imaging of the abdomen and pelvis was performed using the standard protocol following bolus administration of intravenous contrast. CONTRAST:  114mL OMNIPAQUE IOHEXOL 300 MG/ML  SOLN COMPARISON:  01/24/2019 CT abdomen/pelvis. FINDINGS: Lower chest: No significant pulmonary nodules or acute consolidative airspace disease. Rounded cystic 1.2 x 0.9 cm right retrocrural structure (series 2/image 17) is stable in size and most likely lymphatic in etiology, considered benign. Right coronary atherosclerosis. Hepatobiliary: Normal liver size. No liver mass. Cholecystectomy. No biliary ductal dilatation. Pancreas: Normal, with no mass or duct dilation. Spleen: Normal size. No mass. Adrenals/Urinary Tract: No discrete adrenal nodules. No hydronephrosis. Several scattered subcentimeter hypodense renal cortical lesions in both kidneys are too small to characterize and not appreciably changed, requiring no follow-up. Normal bladder. Stomach/Bowel: Small hiatal hernia. Otherwise normal nondistended stomach. Normal caliber small bowel with no small bowel wall thickening. Appendectomy. Oral contrast transits to the rectum. Marked diffuse colonic diverticulosis, most prominent in the sigmoid colon, with no large bowel wall thickening or significant pericolonic fat stranding. Vascular/Lymphatic: Atherosclerotic nonaneurysmal abdominal aorta. Patent portal, splenic, hepatic and renal veins. Largest mesenteric node measures 0.9 cm short axis centrally (series 2/image 40), stable since 01/24/2019 CT. No pathologically enlarged lymph nodes in the abdomen or pelvis. Reproductive: Status post hysterectomy, with no abnormal findings at the vaginal cuff. No adnexal mass. Other: No pneumoperitoneum, ascites or focal fluid collection. Musculoskeletal:  No aggressive appearing focal osseous lesions. Stable degenerative sclerosis at the pubic symphysis, asymmetric to the left. Marked lower lumbar facet arthropathy bilaterally.  Moderate multilevel thoracolumbar degenerative disc disease. IMPRESSION: 1. No abdominopelvic lymphadenopathy. Previously described top-normal size central mesenteric lymph node is stable. Previously described small right retrocrural structure is fluid density and stable and compatible with a benign lymphatic structure. 2. No acute abnormality. 3. Marked diffuse colonic diverticulosis. 4. Small hiatal hernia. 5.  Aortic Atherosclerosis (ICD10-I70.0). Electronically Signed   By: Ilona Sorrel M.D.   On: 08/15/2019 12:03    ASSESSMENT & PLAN:   73 y.o. female with  1. Lymphadenopathy - Rt retrocrural and borderline mesenteric LN  PLAN:  A&P: -Discussed pt labwork, 08/15/19; all values are WNL except for Calcium at 8.6.  -Discussed 08/15/2019 CT Abdomen/Pelvis which revealed that lymph node is borderline but is not enlarged. Nothing shows concern for progress of malignancy or lymphoma.  -no significant LNadenopathy to consider bx at this time. -Counseled the pt towards complete smoking cessation and discussed that this could cause some inflammation and lymphadenopathy -Continue annual CT Chest with pulmonology for pulmonary nodules and aortic aneurysm -Discussed that we will allow her PCP to follow up and reach out if necessary.   FOLLOW-UP:  RTC with Dr Irene Limbo as needed  All of the patients questions were answered with apparent satisfaction. The patient knows to call the clinic with any problems, questions or concerns.  The total time spent in the appt was 15 minutes and more than 50% was on counseling and direct patient cares.   Sullivan Lone MD [place] AAHIVMS [facility] Hematology/Oncology Physician [facility]  (Office):       [phone] (Work cell):  [phone] (Fax):           [phone]  08/22/2019 12:12 PM  I, Yevette Edwards, am acting as a scribe for Dr. Sullivan Lone.   .I have reviewed the above documentation for accuracy and completeness, and I agree with the  above. Brunetta Genera MD

## 2019-08-22 ENCOUNTER — Inpatient Hospital Stay (HOSPITAL_BASED_OUTPATIENT_CLINIC_OR_DEPARTMENT_OTHER): Payer: Medicare Other | Admitting: Hematology

## 2019-08-22 ENCOUNTER — Other Ambulatory Visit: Payer: Self-pay

## 2019-08-22 ENCOUNTER — Telehealth: Payer: Self-pay | Admitting: Hematology

## 2019-08-22 VITALS — BP 130/79 | HR 82 | Temp 99.1°F | Resp 18 | Ht 61.0 in | Wt 135.7 lb

## 2019-08-22 DIAGNOSIS — R590 Localized enlarged lymph nodes: Secondary | ICD-10-CM

## 2019-08-22 NOTE — Telephone Encounter (Signed)
Per 8/24 los RTC with Dr Irene Limbo as needed

## 2019-08-24 DIAGNOSIS — H401131 Primary open-angle glaucoma, bilateral, mild stage: Secondary | ICD-10-CM | POA: Diagnosis not present

## 2019-09-10 ENCOUNTER — Other Ambulatory Visit: Payer: Self-pay

## 2019-09-10 ENCOUNTER — Ambulatory Visit (INDEPENDENT_AMBULATORY_CARE_PROVIDER_SITE_OTHER): Payer: Medicare Other

## 2019-09-10 DIAGNOSIS — Z23 Encounter for immunization: Secondary | ICD-10-CM

## 2019-10-06 ENCOUNTER — Other Ambulatory Visit: Payer: Self-pay | Admitting: Internal Medicine

## 2019-10-06 DIAGNOSIS — Z1231 Encounter for screening mammogram for malignant neoplasm of breast: Secondary | ICD-10-CM

## 2019-10-12 ENCOUNTER — Ambulatory Visit: Payer: Medicare Other | Admitting: Cardiology

## 2019-10-12 ENCOUNTER — Other Ambulatory Visit: Payer: Self-pay

## 2019-10-12 ENCOUNTER — Encounter: Payer: Self-pay | Admitting: *Deleted

## 2019-10-12 ENCOUNTER — Encounter: Payer: Self-pay | Admitting: Cardiology

## 2019-10-12 VITALS — BP 142/80 | HR 66 | Ht 61.0 in | Wt 136.0 lb

## 2019-10-12 DIAGNOSIS — I10 Essential (primary) hypertension: Secondary | ICD-10-CM | POA: Diagnosis not present

## 2019-10-12 DIAGNOSIS — R079 Chest pain, unspecified: Secondary | ICD-10-CM

## 2019-10-12 DIAGNOSIS — I2510 Atherosclerotic heart disease of native coronary artery without angina pectoris: Secondary | ICD-10-CM | POA: Diagnosis not present

## 2019-10-12 DIAGNOSIS — R0789 Other chest pain: Secondary | ICD-10-CM

## 2019-10-12 DIAGNOSIS — I2584 Coronary atherosclerosis due to calcified coronary lesion: Secondary | ICD-10-CM

## 2019-10-12 DIAGNOSIS — I700 Atherosclerosis of aorta: Secondary | ICD-10-CM

## 2019-10-12 NOTE — Patient Instructions (Signed)
Medication Instructions:  No changes If you need a refill on your cardiac medications before your next appointment, please call your pharmacy.   Lab work: none If you have labs (blood work) drawn today and your tests are completely normal, you will receive your results only by: Marland Kitchen MyChart Message (if you have MyChart) OR . A paper copy in the mail If you have any lab test that is abnormal or we need to change your treatment, we will call you to review the results.  Testing/Procedures: Your physician has requested that you have a lexiscan myoview. For further information please visit [email or web address]. Please follow instruction sheet, as given.   Follow-Up: At [facility], you and your health needs are our priority.  As part of our continuing mission to provide you with exceptional heart care, we have created designated Provider Care Teams.  These Care Teams include your primary Cardiologist (physician) and Advanced Practice Providers (APPs -  Physician Assistants and Nurse Practitioners) who all work together to provide you with the care you need, when you need it. You will need a follow up appointment in 12 months.  Please call our office 2 months in advance to schedule this appointment.  You may see Candee Furbish, MD or one of the following Advanced Practice Providers on your designated Care Team:   Truitt Merle, NP Cecilie Kicks, NP . Kathyrn Drown, NP  Any Other Special Instructions Will Be Listed Below (If Applicable).

## 2019-10-12 NOTE — Progress Notes (Signed)
Cardiology Office Note    Date:  10/12/2019   ID:  [name], DOB 26-Jun-1946, MRN [id number]  PCP:  Hoyt Koch, MD  Cardiologist:   Candee Furbish, MD     History of Present Illness:  Rachel Gardner is a 73 y.o. female here for follow-up of coronary calcification, dilated aortic root, aortic atherosclerosis here for follow-up    She has seen Dr. Cyndia Bent, aortic root is currently 4 cm on most recent CT scan, was previously described as 4.4 however this may been a generous measurement.  Left lower lobe lung nodule that is stable at 7 mm. Extensive coronary calcification was seen on CT scan. Prior she had exertional tiredness and neck tightness but this has resolved. Nuclear stress test was reassuring in 2017. Had prior neck injury. BP was 160 at the time. Even her necklace was bothering her.   2013 had BPPV in [place]. Has a cochlear implant. Has had extensive neurologic workup as well as all reassuring.    On 04/19/12 - ECHO normal EF.  ETT 7:30 min non specific STT changes Nuclear stress 2017  Statin intolerance- Lipitor, crestor, pravastatin, zocor,  - muscle pain. LDL 115. Stopped taking Zetia, trying to eat- celery, juice.   Quit smoking 12/2015. Retired Marine scientist. She enjoyed traveling from [place] to [name], [place], [place] for her second birthday.  09/16/2018-chief complaint chest pain.  She has been experiencing chest pain in her breast/left-sided chest region. Sudden, takes a deep breath then goes away. When walking 2.68miles felt OK. Had vertigo before. Falling sensation. Heard heart beat left ear.  She snores. Wakes up gasping at times. Wakes up congestion. Tongue feels large in morning.   10/12/2019-here for follow-up of coronary artery calcification aortic atherosclerosis.  Last visit was having some sharp chest pain. Fleeting. Nervous at times. Lost car. 2 dogs got sick. Snake . Son works in [place], son still there, more stress. Makes masks. Son helps  financial. Seamstress since 60. Relax when pounding.  She is still having some chest discomfort, this is not changed.  She is nervous about this.  No fevers chills nausea vomiting syncope bleeding   Past Medical History:  Diagnosis Date  . Allergy    hay fever  . Ascending aortic aneurysm ([place])   . Blood in stool   . BPPV (benign paroxysmal positional vertigo)   . CPAP (continuous positive airway pressure) dependence 02/09/2019  . Diverticulitis   . Fatty liver 05/28/10   per CT at [facility] [place]  . Genital herpes   . GERD (gastroesophageal reflux disease)   . Glaucoma   . Helicobacter pylori infection   . Hiatal hernia 05/28/10   CT @ [facility]  . Hyperlipidemia   . Hypertension   . Hypothyroidism   . 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 Kidney stones   . Lung nodule seen on imaging study   . Meniere's disease   . Obstructive sleep apnea   . OSA on CPAP 04/13/2019   Moderate with AHI 20/hr now on CPAP at 13cm H2O    Past Surgical History:  Procedure Laterality Date  . ABDOMINAL HYSTERECTOMY  1995  . ANAL RECTAL MANOMETRY N/A 08/06/2016   Procedure: ANO RECTAL MANOMETRY;  Surgeon: Doran Stabler, MD;  Location: [facility];  Service: Gastroenterology;  Laterality: N/A;  . APPENDECTOMY  1989  . BPPV     x 4 after L cochlear implant  . [place]  breast reduction  . CHOLECYSTECTOMY  1989  . COCHLEAR IMPLANT     Left/2009; Right/2012  . LAPAROSCOPIC SIGMOID COLECTOMY  2012   with takedown of splenic flexure; cystoscopy with bilateral ureteral stent placement  . REDUCTION MAMMAPLASTY Bilateral 1989   [place]  . TOTAL THYROIDECTOMY  1995    Outpatient Medications Prior to Visit  Medication Sig Dispense Refill  . ascorbic acid (VITAMIN C) 1000 MG tablet Take 500 mg by mouth daily.     Marland Kitchen aspirin 81 MG chewable tablet Chew 81 mg by mouth daily.     . bimatoprost (LUMIGAN) 0.01 % SOLN Place 1 drop into both eyes at bedtime.    . Calcium  Carb-Cholecalciferol (CALCIUM 500 + D3) 500-600 MG-UNIT TABS Take 1 Dose by mouth daily.     . fluticasone (FLONASE) 50 MCG/ACT nasal spray Place 2 sprays into both nostrils at bedtime.    Marland Kitchen levothyroxine (SYNTHROID, LEVOTHROID) 125 MCG tablet Take 1 tablet (125 mcg total) by mouth daily before breakfast. 90 tablet 3  . losartan-hydrochlorothiazide (HYZAAR) 100-25 MG per tablet Take 1 tablet by mouth daily.     . meclizine (ANTIVERT) 25 MG tablet Take 1 tablet (25 mg total) by mouth 3 (three) times daily as needed for dizziness. 90 tablet 4  . Omega-3 Fatty Acids (FISH OIL PO) Take 1 tablet by mouth daily.    . Probiotic Product (PROBIOTIC ADVANCED PO) Take 1 capsule by mouth daily.     No facility-administered medications prior to visit.      Allergies:   Morphine and related, Statins, Tape, and Wellbutrin [bupropion]   Social History   Socioeconomic History  . Marital status: Divorced    Spouse name: Not on file  . Number of children: 3  . Years of education: Not on file  . Highest education level: Not on file  Occupational History  . Occupation: retired Marine scientist  Social Needs  . Financial resource strain: Not on file  . Food insecurity    Worry: Not on file    Inability: Not on file  . Transportation needs    Medical: Not on file    Non-medical: Not on file  Tobacco Use  . Smoking status: Current Every Day Smoker    Packs/day: 0.50    Years: 41.00    Pack years: 20.50    Types: Cigarettes    Last attempt to quit: 01/18/2016    Years since quitting: 3.7  . Smokeless tobacco: Never Used  Substance and Sexual Activity  . Alcohol use: Yes    Alcohol/week: 0.0 standard drinks    Comment: rarely  . Drug use: No  . Sexual activity: Never  Lifestyle  . Physical activity    Days per week: Not on file    Minutes per session: Not on file  . Stress: Not on file  Relationships  . Social Herbalist on phone: Not on file    Gets together: Not on file    Attends  religious service: Not on file    Active member of club or organization: Not on file    Attends meetings of clubs or organizations: Not on file    Relationship status: Not on file  Other Topics Concern  . Not on file  Social History Narrative   Regular exercise: walk; 1 mile a day / walk 5 miles weekend;    Caffeine use: 1 cup of coffee in the AM      3 children  Family History:  The patient's family history includes Colon cancer in her cousin and maternal aunt; Hyperlipidemia in her brother; Hypertension in her father, mother, and sister; Prostate cancer in her brother and paternal uncle; Thyroid disease in her sister.   ROS:   Please see the history of present illness.    Review of Systems  All other systems reviewed and are negative.   PHYSICAL EXAM:   VS:  BP (!) 142/80   Pulse 66   Ht 5\' 1"  (1.549 m)   Wt 136 lb (61.7 kg)   SpO2 97%   BMI 25.70 kg/m    GEN: Well nourished, well developed, in no acute distress  HEENT: normal  Neck: no JVD, carotid bruits, or masses Cardiac: RRR; no murmurs, rubs, or gallops,no edema  Respiratory:  clear to auscultation bilaterally, normal work of breathing GI: soft, nontender, nondistended, + BS MS: no deformity or atrophy  Skin: warm and dry, no rash Neuro:  Alert and Oriented x 3, Strength and sensation are intact Psych: euthymic mood, full affect     Wt Readings from Last 3 Encounters:  10/12/19 136 lb (61.7 kg)  08/22/19 135 lb 11.2 oz (61.6 kg)  04/13/19 137 lb (62.1 kg)      Studies/Labs Reviewed:   EKG: 10/12/2019-sinus rhythm subtle T wave inversion V2.  09/16/2018-sinus rhythm, there is subtle T wave inversion in V2.  This is slightly different from prior EKG but fairly nonspecific.  Personally reviewed and interpreted.  05/05/17 shows sinus rhythm 85 with right atrial enlargement, borderline QT prolongation approximately 420 ms. Personally viewed. 03/19/16-sinus rhythm, 74, mild sinus arrhythmia otherwise  unremarkable, personally viewed.  Recent Labs: 02/03/2019: TSH 0.23 08/15/2019: ALT 14; BUN 18; Creatinine 0.89; Hemoglobin 14.0; Platelets 171; Potassium 3.5; Sodium 139   Lipid Panel    Component Value Date/Time   CHOL 231 (H) 02/07/2019 0907   CHOL 222 (H) 12/16/2016 0826   TRIG 320.0 (H) 02/07/2019 0907   HDL 34.30 (L) 02/07/2019 0907   HDL 48 12/16/2016 0826   CHOLHDL 7 02/07/2019 0907   VLDL 64.0 (H) 02/07/2019 0907   LDLCALC 119 (H) 02/18/2018 0740   LDLCALC 138 (H) 12/16/2016 0826   LDLDIRECT 166.0 02/07/2019 0907    Additional studies/ records that were reviewed today include:  ECHO, ETT from 2013 reviewed. Normal.   Nuc 2017  Nuclear stress EF: 63%.  There was no ST segment deviation noted during stress.  The study is normal.  This is a low risk study.  The left ventricular ejection fraction is normal (55-65%).   ASSESSMENT:    1. Coronary artery calcification   2. Aortic atherosclerosis ([place])   3. Essential hypertension   4. Atypical chest pain   5. Chest pain, unspecified type      PLAN:  In order of problems listed above:  Coronary artery calcification  - LAD distribution. Reassuring stress test on 03/25/16 with no ischemia.  We will go ahead and check a stress test.  Atypical chest pain -Having some discomfort.  Since it has been 3 years.  We will repeat stress test.  Smoker  - quit in 2017 but restarted with stress.   Obstructive sleep apnea -Enjoys using the CPAP.  Dr. Radford Pax.  Aortic atherosclerosis -She is not interested in using Zetia or statin. She is trying to eat more fruits and vegetables. LDL has improved.  LDL 119.  Still would like her to be on a statin but she is not interested.  We  have put forth our best effort.  Hyperlipidemia -Mild previously, LDL 110. Prior statin intolerances. Pravastatin, Crestor, Lipitor previously tried. Muscle pain. -Not interested in trying medication.  If she ever wishes to, we can always let her  talk with the lipid referral clinic.  Ascending aortic dilation -4.4 cm previously measured oh (possibly generous) however repeat CT scan now shows 4.0 cm ascending aortic aneurysm.  On 01/19/2019 CT 4.1.  Stable., followed by Dr. Cyndia Bent.  Seen previously on 01/20/2018.  No changes made.  Essential hypertension -No changes made, medications reviewed.  Overall doing very well.  BPPV  - Meclizine.  Feeling better.  Cochlear implant noted.  1 year follow up  Medication Adjustments/Labs and Tests Ordered: Current medicines are reviewed at length with the patient today.  Concerns regarding medicines are outlined above.  Medication changes, Labs and Tests ordered today are listed in the Patient Instructions below. Patient Instructions  Medication Instructions:  No changes If you need a refill on your cardiac medications before your next appointment, please call your pharmacy.   Lab work: none If you have labs (blood work) drawn today and your tests are completely normal, you will receive your results only by: Marland Kitchen MyChart Message (if you have MyChart) OR . A paper copy in the mail If you have any lab test that is abnormal or we need to change your treatment, we will call you to review the results.  Testing/Procedures: Your physician has requested that you have a lexiscan myoview. For further information please visit [email or web address]. Please follow instruction sheet, as given.   Follow-Up: At [facility], you and your health needs are our priority.  As part of our continuing mission to provide you with exceptional heart care, we have created designated Provider Care Teams.  These Care Teams include your primary Cardiologist (physician) and Advanced Practice Providers (APPs -  Physician Assistants and Nurse Practitioners) who all work together to provide you with the care you need, when you need it. You will need a follow up appointment in 12 months.  Please call our office 2 months in  advance to schedule this appointment.  You may see Candee Furbish, MD or one of the following Advanced Practice Providers on your designated Care Team:   Truitt Merle, NP Cecilie Kicks, NP . Kathyrn Drown, NP  Any Other Special Instructions Will Be Listed Below (If Applicable).         Signed, Candee Furbish, MD  10/12/2019 11:45 AM    [address], [place], [place]  [address] Phone: [phone]; Fax: [phone]

## 2019-10-13 ENCOUNTER — Telehealth: Payer: Self-pay | Admitting: *Deleted

## 2019-10-13 DIAGNOSIS — G4733 Obstructive sleep apnea (adult) (pediatric): Secondary | ICD-10-CM

## 2019-10-13 NOTE — Telephone Encounter (Signed)
-----   Message from Sueanne Margarita, MD sent at 10/12/2019  6:07 PM EDT ----- Regarding: RE: supplies Gae Bon, ok to send a Rx for CPAP supplies and print it off for her to use where she wants to order from  [place] ----- Message ----- From: Rodman Key, RN Sent: 10/12/2019  11:02 AM EDT To: Sueanne Margarita, MD, Freada Bergeron, CMA Subject: supplies                                       Hey ladies, Pt was seen by Dr. Marlou Porch today.She would like a call to talk about getting a prescription for cpap supplies instead of automatically getting them from [facility] (I think).  She can buy them for less money through a different company.   Thank you, Michalene

## 2019-10-13 NOTE — Telephone Encounter (Signed)
Cpap supplies order has printed and placed with the front desk staff for pick up. Patient has been notified.

## 2019-10-14 ENCOUNTER — Other Ambulatory Visit: Payer: Self-pay

## 2019-10-14 ENCOUNTER — Encounter: Payer: Self-pay | Admitting: Internal Medicine

## 2019-10-14 ENCOUNTER — Ambulatory Visit (INDEPENDENT_AMBULATORY_CARE_PROVIDER_SITE_OTHER): Payer: Medicare Other | Admitting: Internal Medicine

## 2019-10-14 VITALS — BP 118/84 | HR 76 | Temp 98.3°F | Ht 61.0 in | Wt 136.0 lb

## 2019-10-14 DIAGNOSIS — I7121 Aneurysm of the ascending aorta, without rupture: Secondary | ICD-10-CM

## 2019-10-14 DIAGNOSIS — E785 Hyperlipidemia, unspecified: Secondary | ICD-10-CM

## 2019-10-14 DIAGNOSIS — I712 Thoracic aortic aneurysm, without rupture: Secondary | ICD-10-CM

## 2019-10-14 DIAGNOSIS — J410 Simple chronic bronchitis: Secondary | ICD-10-CM

## 2019-10-14 DIAGNOSIS — R159 Full incontinence of feces: Secondary | ICD-10-CM | POA: Diagnosis not present

## 2019-10-14 DIAGNOSIS — Z0000 Encounter for general adult medical examination without abnormal findings: Secondary | ICD-10-CM

## 2019-10-14 DIAGNOSIS — E039 Hypothyroidism, unspecified: Secondary | ICD-10-CM

## 2019-10-14 DIAGNOSIS — R3121 Asymptomatic microscopic hematuria: Secondary | ICD-10-CM

## 2019-10-14 DIAGNOSIS — I10 Essential (primary) hypertension: Secondary | ICD-10-CM

## 2019-10-14 NOTE — Assessment & Plan Note (Signed)
Stable follows yearly.

## 2019-10-14 NOTE — Assessment & Plan Note (Signed)
Flu shot complete for season. Pneumonia compete. Shingrix counseled. Tetanus due 2025. Colonoscopy due 2022. Mammogram due Dec 2020, pap smear aged out and dexa declines further. Counseled about sun safety and mole surveillance. Counseled about the dangers of distracted driving. Given 10 year screening recommendations.

## 2019-10-14 NOTE — Assessment & Plan Note (Signed)
Checking lipid panel and adjust as needed. Not on statin. 

## 2019-10-14 NOTE — Assessment & Plan Note (Signed)
Counseled to quit and she does not feel able at this time to try to quit with pandemic. Stable cough and breathing. She is aware of the health risks associated.

## 2019-10-14 NOTE — Progress Notes (Signed)
Subjective:   Patient ID: Rachel Gardner, female    DOB: Oct 06, 1946, 73 y.o.   MRN: [id number]  HPI Here for medicare wellness and physical, no new complaints. Please see A/P for status and treatment of chronic medical problems.   Diet: heart healthy Physical activity: sedentary, active but no formal exercise Depression/mood screen: negative Hearing: severe loss bilateral, aids bilaterally Visual acuity: grossly normal with lens, performs annual eye exam  ADLs: capable Fall risk: none Home safety: good Cognitive evaluation: intact to orientation, naming, recall and repetition EOL planning: adv directives discussed    Office Visit from 10/14/2019 in [facility]  PHQ-2 Total Score  0      I have personally reviewed and have noted 1. The patient's medical and social history - reviewed today no changes 2. Their use of alcohol, tobacco or illicit drugs 3. Their current medications and supplements 4. The patient's functional ability including ADL's, fall risks, home safety risks and hearing or visual impairment. 5. Diet and physical activities 6. Evidence for depression or mood disorders 7. Care team reviewed and updated  Patient Care Team: Hoyt Koch, MD as PCP - General (Internal Medicine) Jerline Pain, MD as PCP - Cardiology (Cardiology) Lafayette Dragon, MD (Inactive) as Consulting Physician (Gastroenterology) Suella Broad, MD as Consulting Physician (Physical Medicine and Rehabilitation) Estevan Ryder, MD as Referring Physician (Cardiology) Renato Shin, MD as Consulting Physician (Endocrinology) Vicie Mutters, MD as Consulting Physician (Otolaryngology) Past Medical History:  Diagnosis Date  . Allergy    hay fever  . Ascending aortic aneurysm ([place])   . Blood in stool   . BPPV (benign paroxysmal positional vertigo)   . CPAP (continuous positive airway pressure) dependence 02/09/2019  . Diverticulitis   . Fatty liver 05/28/10    per [facility] at [facility]  . Genital herpes   . GERD (gastroesophageal reflux disease)   . Glaucoma   . Helicobacter pylori infection   . Hiatal hernia 05/28/10   CT @ [facility]  . Hyperlipidemia   . Hypertension   . Hypothyroidism   . 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 Kidney stones   . Lung nodule seen on imaging study   . Meniere's disease   . Obstructive sleep apnea   . OSA on CPAP 04/13/2019   Moderate with AHI 20/hr now on CPAP at 13cm H2O   Past Surgical History:  Procedure Laterality Date  . ABDOMINAL HYSTERECTOMY  1995  . ANAL RECTAL MANOMETRY N/A 08/06/2016   Procedure: ANO RECTAL MANOMETRY;  Surgeon: Doran Stabler, MD;  Location: [facility];  Service: Gastroenterology;  Laterality: N/A;  . APPENDECTOMY  1989  . BPPV     x 4 after L cochlear implant  . BREAST SURGERY  1989   breast reduction  . CHOLECYSTECTOMY  1989  . COCHLEAR IMPLANT     Left/2009; Right/2012  . LAPAROSCOPIC SIGMOID COLECTOMY  2012   with takedown of splenic flexure; cystoscopy with bilateral ureteral stent placement  . REDUCTION MAMMAPLASTY Bilateral 1989   [place]  . TOTAL THYROIDECTOMY  1995   Family History  Problem Relation Age of Onset  . Hypertension Mother   . Hypertension Father   . Hypertension Sister   . Prostate cancer Brother   . Hyperlipidemia Brother   . Colon cancer Maternal Aunt   . Prostate cancer Paternal Uncle   . Colon cancer Cousin        x  2; paternal  . Thyroid disease Sister     Review of Systems  Constitutional: Negative.   HENT: Negative.   Eyes: Negative.   Respiratory: Positive for cough. Negative for chest tightness and shortness of breath.        Chronic stable  Cardiovascular: Negative for chest pain, palpitations and leg swelling.  Gastrointestinal: Negative for abdominal distention, abdominal pain, constipation, diarrhea, nausea and vomiting.       Stable fecal incontinence  Musculoskeletal: Negative.   Skin: Negative.    Neurological: Negative.   Psychiatric/Behavioral: Negative.     Objective:  Physical Exam Constitutional:      Appearance: She is well-developed.  HENT:     Head: Normocephalic and atraumatic.  Neck:     Musculoskeletal: Normal range of motion.  Cardiovascular:     Rate and Rhythm: Normal rate and regular rhythm.  Pulmonary:     Effort: Pulmonary effort is normal. No respiratory distress.     Breath sounds: Normal breath sounds. No wheezing or rales.  Abdominal:     General: Bowel sounds are normal. There is no distension.     Palpations: Abdomen is soft.     Tenderness: There is no abdominal tenderness. There is no rebound.  Skin:    General: Skin is warm and dry.  Neurological:     Mental Status: She is alert and oriented to person, place, and time.     Coordination: Coordination normal.     Vitals:   10/14/19 0945  BP: 118/84  Pulse: 76  Temp: 98.3 F (36.8 C)  TempSrc: Oral  SpO2: 98%  Weight: 136 lb (61.7 kg)  Height: 5\' 1"  (1.549 m)    Assessment & Plan:

## 2019-10-14 NOTE — Assessment & Plan Note (Signed)
Checking CMP and adjust losartan/hctz as needed. BP at goal.

## 2019-10-14 NOTE — Assessment & Plan Note (Signed)
Referral to surgery, some potential damage to levator ani muscle due to past surgery. She wishes to have this evaluated.

## 2019-10-14 NOTE — Assessment & Plan Note (Signed)
Checking TSH and free T4 and adjust as needed synthroid 125 mcg daily.

## 2019-10-14 NOTE — Patient Instructions (Signed)
We will check the labs and get you in with the proctologist.   Health Maintenance, Female Adopting a healthy lifestyle and getting preventive care are important in promoting health and wellness. Ask your health care provider about:  The right schedule for you to have regular tests and exams.  Things you can do on your own to prevent diseases and keep yourself healthy. What should I know about diet, weight, and exercise? Eat a healthy diet   Eat a diet that includes plenty of vegetables, fruits, low-fat dairy products, and lean protein.  Do not eat a lot of foods that are high in solid fats, added sugars, or sodium. Maintain a healthy weight Body mass index (BMI) is used to identify weight problems. It estimates body fat based on height and weight. Your health care provider can help determine your BMI and help you achieve or maintain a healthy weight. Get regular exercise Get regular exercise. This is one of the most important things you can do for your health. Most adults should:  Exercise for at least 150 minutes each week. The exercise should increase your heart rate and make you sweat (moderate-intensity exercise).  Do strengthening exercises at least twice a week. This is in addition to the moderate-intensity exercise.  Spend less time sitting. Even light physical activity can be beneficial. Watch cholesterol and blood lipids Have your blood tested for lipids and cholesterol at 72 years of age, then have this test every 5 years. Have your cholesterol levels checked more often if:  Your lipid or cholesterol levels are high.  You are older than 72 years of age.  You are at high risk for heart disease. What should I know about cancer screening? Depending on your health history and family history, you may need to have cancer screening at various ages. This may include screening for:  Breast cancer.  Cervical cancer.  Colorectal cancer.  Skin cancer.  Lung cancer. What  should I know about heart disease, diabetes, and high blood pressure? Blood pressure and heart disease  High blood pressure causes heart disease and increases the risk of stroke. This is more likely to develop in people who have high blood pressure readings, are of African descent, or are overweight.  Have your blood pressure checked: ? Every 3-5 years if you are 72-93 years of age. ? Every year if you are 72 years old or older. Diabetes Have regular diabetes screenings. This checks your fasting blood sugar level. Have the screening done:  Once every three years after age 72 if you are at a normal weight and have a low risk for diabetes.  More often and at a younger age if you are overweight or have a high risk for diabetes. What should I know about preventing infection? Hepatitis B If you have a higher risk for hepatitis B, you should be screened for this virus. Talk with your health care provider to find out if you are at risk for hepatitis B infection. Hepatitis C Testing is recommended for:  Everyone born from [birth year] through 1964.  Anyone with known risk factors for hepatitis C. Sexually transmitted infections (STIs)  Get screened for STIs, including gonorrhea and chlamydia, if: ? You are sexually active and are younger than 72 years of age. ? You are older than 72 years of age and your health care provider tells you that you are at risk for this type of infection. ? Your sexual activity has changed since you were last screened, and you  are at increased risk for chlamydia or gonorrhea. Ask your health care provider if you are at risk.  Ask your health care provider about whether you are at high risk for HIV. Your health care provider may recommend a prescription medicine to help prevent HIV infection. If you choose to take medicine to prevent HIV, you should first get tested for HIV. You should then be tested every 3 months for as long as you are taking the medicine. Pregnancy  If  you are about to stop having your period (premenopausal) and you may become pregnant, seek counseling before you get pregnant.  Take 400 to 800 micrograms (mcg) of folic acid every day if you become pregnant.  Ask for birth control (contraception) if you want to prevent pregnancy. Osteoporosis and menopause Osteoporosis is a disease in which the bones lose minerals and strength with aging. This can result in bone fractures. If you are 72 years old or older, or if you are at risk for osteoporosis and fractures, ask your health care provider if you should:  Be screened for bone loss.  Take a calcium or vitamin D supplement to lower your risk of fractures.  Be given hormone replacement therapy (HRT) to treat symptoms of menopause. Follow these instructions at home: Lifestyle  Do not use any products that contain nicotine or tobacco, such as cigarettes, e-cigarettes, and chewing tobacco. If you need help quitting, ask your health care provider.  Do not use street drugs.  Do not share needles.  Ask your health care provider for help if you need support or information about quitting drugs. Alcohol use  Do not drink alcohol if: ? Your health care provider tells you not to drink. ? You are pregnant, may be pregnant, or are planning to become pregnant.  If you drink alcohol: ? Limit how much you use to 0-1 drink a day. ? Limit intake if you are breastfeeding.  Be aware of how much alcohol is in your drink. In the [place] one drink equals one 12 oz bottle of beer (355 mL), one 5 oz glass of wine (148 mL), or one 1 oz glass of hard liquor (44 mL). General instructions  Schedule regular health, dental, and eye exams.  Stay current with your vaccines.  Tell your health care provider if: ? You often feel depressed. ? You have ever been abused or do not feel safe at home. Summary  Adopting a healthy lifestyle and getting preventive care are important in promoting health and wellness.   Follow your health care provider's instructions about healthy diet, exercising, and getting tested or screened for diseases.  Follow your health care provider's instructions on monitoring your cholesterol and blood pressure. This information is not intended to replace advice given to you by your health care provider. Make sure you discuss any questions you have with your health care provider. Document Released: 06/30/2011 Document Revised: 12/08/2018 Document Reviewed: 12/08/2018 Elsevier Patient Education  2020 [employer].

## 2019-10-17 ENCOUNTER — Other Ambulatory Visit (INDEPENDENT_AMBULATORY_CARE_PROVIDER_SITE_OTHER): Payer: Medicare Other

## 2019-10-17 DIAGNOSIS — Z0000 Encounter for general adult medical examination without abnormal findings: Secondary | ICD-10-CM | POA: Diagnosis not present

## 2019-10-17 LAB — COMPREHENSIVE METABOLIC PANEL
ALT: 12 U/L (ref 0–35)
AST: 15 U/L (ref 0–37)
Albumin: 4.1 g/dL (ref 3.5–5.2)
Alkaline Phosphatase: 81 U/L (ref 39–117)
BUN: 22 mg/dL (ref 6–23)
CO2: 28 mEq/L (ref 19–32)
Calcium: 9.5 mg/dL (ref 8.4–10.5)
Chloride: 106 mEq/L (ref 96–112)
Creatinine, Ser: 0.94 mg/dL (ref 0.40–1.20)
GFR: 58.43 mL/min — ABNORMAL LOW (ref >60.00)
Glucose, Bld: 95 mg/dL (ref 70–99)
Potassium: 3.7 mEq/L (ref 3.5–5.1)
Sodium: 141 mEq/L (ref 135–145)
Total Bilirubin: 0.6 mg/dL (ref 0.2–1.2)
Total Protein: 7.3 g/dL (ref 6.0–8.3)

## 2019-10-17 LAB — CBC
HCT: 43.9 % (ref 36.0–46.0)
Hemoglobin: 14.7 g/dL (ref 12.0–15.0)
MCHC: 33.4 g/dL (ref 30.0–36.0)
MCV: 88.1 fl (ref 78.0–100.0)
Platelets: 192 10*3/uL (ref 150.0–400.0)
RBC: 4.99 Mil/uL (ref 3.87–5.11)
RDW: 13.2 % (ref 11.5–15.5)
WBC: 10.6 10*3/uL — ABNORMAL HIGH (ref 4.0–10.5)

## 2019-10-17 LAB — LIPID PANEL
Cholesterol: 228 mg/dL — ABNORMAL HIGH (ref 0–200)
HDL: 38.6 mg/dL — ABNORMAL LOW (ref >39.00)
LDL Cholesterol: 152 mg/dL — ABNORMAL HIGH (ref 0–99)
NonHDL: 189.78
Total CHOL/HDL Ratio: 6
Triglycerides: 187 mg/dL — ABNORMAL HIGH (ref 0.0–149.0)
VLDL: 37.4 mg/dL (ref 0.0–40.0)

## 2019-10-17 LAB — T4, FREE: Free T4: 0.7 ng/dL (ref 0.60–1.60)

## 2019-10-17 LAB — TSH: TSH: 9.33 u[IU]/mL — ABNORMAL HIGH (ref 0.35–4.50)

## 2019-10-26 ENCOUNTER — Encounter (HOSPITAL_COMMUNITY): Payer: Self-pay | Admitting: *Deleted

## 2019-10-26 ENCOUNTER — Telehealth (HOSPITAL_COMMUNITY): Payer: Self-pay | Admitting: *Deleted

## 2019-10-26 NOTE — Telephone Encounter (Signed)
Left message on voicemail per DPR in reference to upcoming appointment scheduled on 11/02/2019 at 0800 with detailed instructions given per Myocardial Perfusion Study Information Sheet for the test. LM to arrive 15 minutes early, and that it is imperative to arrive on time for appointment to keep from having the test rescheduled. If you need to cancel or reschedule your appointment, please call the office within 24 hours of your appointment. Failure to do so may result in a cancellation of your appointment, and a $50 no show fee. Phone number given for call back for any questions.  mychart sent .Chastin Riesgo, Ranae Palms

## 2019-11-02 ENCOUNTER — Ambulatory Visit (HOSPITAL_COMMUNITY): Payer: Medicare Other | Attending: Cardiology

## 2019-11-02 ENCOUNTER — Other Ambulatory Visit: Payer: Self-pay

## 2019-11-02 DIAGNOSIS — R079 Chest pain, unspecified: Secondary | ICD-10-CM | POA: Insufficient documentation

## 2019-11-02 LAB — MYOCARDIAL PERFUSION IMAGING
LV dias vol: 46 mL (ref 46–106)
LV sys vol: 15 mL
Peak HR: 91 {beats}/min
Rest HR: 63 {beats}/min
SDS: 2
SRS: 1
SSS: 3
TID: 0.98

## 2019-11-02 MED ORDER — TECHNETIUM TC 99M TETROFOSMIN IV KIT
30.5000 | PACK | Freq: Once | INTRAVENOUS | Status: AC | PRN
  Administered 2019-11-02: 30.5 via INTRAVENOUS
  Filled 2019-11-02: qty 31

## 2019-11-02 MED ORDER — REGADENOSON 0.4 MG/5ML IV SOLN
0.4000 mg | Freq: Once | INTRAVENOUS | Status: AC
  Administered 2019-11-02: 0.4 mg via INTRAVENOUS

## 2019-11-02 MED ORDER — TECHNETIUM TC 99M TETROFOSMIN IV KIT
10.9000 | PACK | Freq: Once | INTRAVENOUS | Status: AC | PRN
  Administered 2019-11-02: 10.9 via INTRAVENOUS
  Filled 2019-11-02: qty 11

## 2019-11-29 ENCOUNTER — Other Ambulatory Visit: Payer: Self-pay

## 2019-11-29 ENCOUNTER — Ambulatory Visit
Admission: RE | Admit: 2019-11-29 | Discharge: 2019-11-29 | Disposition: A | Discharge status: Home / Self Care | Payer: Medicare Other | Source: Ambulatory Visit | Attending: Internal Medicine | Admitting: Internal Medicine

## 2019-11-29 DIAGNOSIS — Z1231 Encounter for screening mammogram for malignant neoplasm of breast: Secondary | ICD-10-CM

## 2019-12-01 DIAGNOSIS — Z45328 Encounter for adjustment and management of other implanted hearing device: Secondary | ICD-10-CM | POA: Diagnosis not present

## 2019-12-01 DIAGNOSIS — H903 Sensorineural hearing loss, bilateral: Secondary | ICD-10-CM | POA: Diagnosis not present

## 2019-12-01 DIAGNOSIS — Z9621 Cochlear implant status: Secondary | ICD-10-CM | POA: Diagnosis not present

## 2019-12-14 ENCOUNTER — Other Ambulatory Visit: Payer: Self-pay | Admitting: *Deleted

## 2019-12-14 DIAGNOSIS — I712 Thoracic aortic aneurysm, without rupture, unspecified: Secondary | ICD-10-CM

## 2020-01-14 ENCOUNTER — Encounter: Payer: Self-pay | Admitting: Internal Medicine

## 2020-01-15 ENCOUNTER — Other Ambulatory Visit: Payer: Self-pay | Admitting: Neurology

## 2020-01-15 DIAGNOSIS — R42 Dizziness and giddiness: Secondary | ICD-10-CM

## 2020-01-19 ENCOUNTER — Telehealth: Payer: Self-pay | Admitting: Neurology

## 2020-01-19 DIAGNOSIS — R42 Dizziness and giddiness: Secondary | ICD-10-CM

## 2020-01-19 MED ORDER — MECLIZINE HCL 25 MG PO TABS: 25.0000 mg | ORAL_TABLET | Freq: Three times a day (TID) | ORAL | 0 refills | Status: DC | PRN

## 2020-01-19 NOTE — Telephone Encounter (Signed)
30 day supply given. pending appt for further refills.

## 2020-01-19 NOTE — Telephone Encounter (Signed)
Pt is needing a refill on her meclizine (ANTIVERT) 25 MG tablet sent in to the [facility] on [address]/u appt has been scheduled.

## 2020-01-23 ENCOUNTER — Other Ambulatory Visit: Payer: Self-pay

## 2020-01-23 ENCOUNTER — Encounter: Payer: Self-pay | Admitting: Internal Medicine

## 2020-01-23 ENCOUNTER — Ambulatory Visit (INDEPENDENT_AMBULATORY_CARE_PROVIDER_SITE_OTHER): Payer: Medicare Other | Admitting: Internal Medicine

## 2020-01-23 VITALS — BP 136/84 | HR 86 | Temp 98.1°F | Ht 61.0 in | Wt 135.0 lb

## 2020-01-23 DIAGNOSIS — R1011 Right upper quadrant pain: Secondary | ICD-10-CM

## 2020-01-23 LAB — COMPREHENSIVE METABOLIC PANEL
ALT: 14 U/L (ref 0–35)
AST: 16 U/L (ref 0–37)
Albumin: 3.9 g/dL (ref 3.5–5.2)
Alkaline Phosphatase: 69 U/L (ref 39–117)
BUN: 27 mg/dL — ABNORMAL HIGH (ref 6–23)
CO2: 30 mEq/L (ref 19–32)
Calcium: 9.4 mg/dL (ref 8.4–10.5)
Chloride: 104 mEq/L (ref 96–112)
Creatinine, Ser: 0.93 mg/dL (ref 0.40–1.20)
GFR: 59.12 mL/min — ABNORMAL LOW (ref >60.00)
Glucose, Bld: 93 mg/dL (ref 70–99)
Potassium: 3.1 mEq/L — ABNORMAL LOW (ref 3.5–5.1)
Sodium: 141 mEq/L (ref 135–145)
Total Bilirubin: 0.6 mg/dL (ref 0.2–1.2)
Total Protein: 7.2 g/dL (ref 6.0–8.3)

## 2020-01-23 LAB — LIPASE: Lipase: 15 U/L (ref 11.0–59.0)

## 2020-01-23 LAB — CBC
HCT: 43 % (ref 36.0–46.0)
Hemoglobin: 14.4 g/dL (ref 12.0–15.0)
MCHC: 33.4 g/dL (ref 30.0–36.0)
MCV: 89 fl (ref 78.0–100.0)
Platelets: 188 10*3/uL (ref 150.0–400.0)
RBC: 4.83 Mil/uL (ref 3.87–5.11)
RDW: 13.1 % (ref 11.5–15.5)
WBC: 9.5 10*3/uL (ref 4.0–10.5)

## 2020-01-23 MED ORDER — PANTOPRAZOLE SODIUM 40 MG PO TBEC: 40.0000 mg | DELAYED_RELEASE_TABLET | Freq: Two times a day (BID) | ORAL | 0 refills | Status: DC

## 2020-01-23 NOTE — Patient Instructions (Addendum)
We are checking labs today.   We have sent in protonix to take 1 pill twice a day to see if this helps with the pain.  Depending on how you are doing we may need another stomach scan.

## 2020-01-23 NOTE — Progress Notes (Signed)
   Subjective:   Patient ID: Rachel Gardner, female    DOB: 07/13/1946, 74 y.o.   MRN: [id number]  HPI The patient is a 74 YO female coming in for stomach pain started about 3 weeks ago. Is in the middle/right UQ and radiates to the back sometimes. Also noticing bloating with eating and at times unrelated to eating. Recent CT abdomen and pelvis for follow up LAD which was stable 07/2019. Denies fevers or chills. Denies nausea or vomiting. Denies lower abdominal pain. Denies change in bowels or blood in stool. Denies new otc medications or change in diet. She is eating less and smaller portions which has helped slightly but not much. Has not tried anything otc for this including tums or maalox.   Review of Systems  Constitutional: Positive for appetite change.  HENT: Negative.   Eyes: Negative.   Respiratory: Negative for cough, chest tightness and shortness of breath.   Cardiovascular: Negative for chest pain, palpitations and leg swelling.  Gastrointestinal: Positive for abdominal distention and abdominal pain. Negative for anal bleeding, blood in stool, constipation, diarrhea, nausea, rectal pain and vomiting.  Musculoskeletal: Negative.   Skin: Negative.   Neurological: Negative.   Psychiatric/Behavioral: Negative.     Objective:  Physical Exam Constitutional:      Appearance: She is well-developed.  HENT:     Head: Normocephalic and atraumatic.  Cardiovascular:     Rate and Rhythm: Normal rate and regular rhythm.  Pulmonary:     Effort: Pulmonary effort is normal. No respiratory distress.     Breath sounds: Normal breath sounds. No wheezing or rales.  Abdominal:     General: Bowel sounds are normal. There is distension.     Palpations: Abdomen is soft. There is no mass.     Tenderness: There is abdominal tenderness. There is no guarding or rebound.     Comments: Minimal distention, pain midline and ruq mild with palpation.   Musculoskeletal:     Cervical back: Normal  range of motion.  Skin:    General: Skin is warm and dry.  Neurological:     Mental Status: She is alert and oriented to person, place, and time.     Coordination: Coordination normal.     Vitals:   01/23/20 0859  BP: 136/84  Pulse: 86  Temp: 98.1 F (36.7 C)  TempSrc: Oral  SpO2: 97%  Weight: 135 lb (61.2 kg)  Height: 5\' 1"  (1.549 m)    This visit occurred during the SARS-CoV-2 public health emergency.  Safety protocols were in place, including screening questions prior to the visit, additional usage of staff PPE, and extensive cleaning of exam room while observing appropriate contact time as indicated for disinfecting solutions.   Assessment & Plan:

## 2020-01-23 NOTE — Assessment & Plan Note (Signed)
Prior gallbladder and appendix removal. Checking CBC, CMP, lipase to rule out infection or bleeding or pancreatitis. Rx protonix BID to see if there is some gastritis. Adjust as needed.

## 2020-01-25 ENCOUNTER — Other Ambulatory Visit: Payer: Self-pay

## 2020-01-25 ENCOUNTER — Ambulatory Visit: Payer: Medicare Other | Admitting: Surgery

## 2020-01-25 ENCOUNTER — Ambulatory Visit
Admission: RE | Admit: 2020-01-25 | Discharge: 2020-01-25 | Disposition: A | Discharge status: Home / Self Care | Payer: Medicare Other | Source: Ambulatory Visit | Attending: Surgery | Admitting: Surgery

## 2020-01-25 ENCOUNTER — Encounter: Payer: Self-pay | Admitting: Internal Medicine

## 2020-01-25 ENCOUNTER — Encounter: Payer: Self-pay | Admitting: Surgery

## 2020-01-25 VITALS — BP 113/75 | HR 92 | Temp 96.4°F | Resp 16 | Ht 61.0 in | Wt 135.0 lb

## 2020-01-25 DIAGNOSIS — I712 Thoracic aortic aneurysm, without rupture, unspecified: Secondary | ICD-10-CM

## 2020-01-25 NOTE — Progress Notes (Signed)
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  The 2 lung nodules have been stable dating back to 2017 and are benign by CT criteria.  She has had no change in her medical condition since I last saw her.  She denies any chest or back pain.  She has had no shortness of breath.  Current Outpatient Medications  Medication Sig Dispense Refill  . ascorbic acid (VITAMIN C) 1000 MG tablet Take 500 mg by mouth daily.     Marland Kitchen aspirin 81 MG chewable tablet Chew 81 mg by mouth daily.     . bimatoprost (LUMIGAN) 0.01 % SOLN Place 1 drop into both eyes at bedtime.    . Calcium Carb-Cholecalciferol (CALCIUM 500 + D3) 500-600 MG-UNIT TABS Take 1 Dose by mouth daily.     . fluticasone (FLONASE) 50 MCG/ACT nasal spray Place 2 sprays into both nostrils at bedtime.    Marland Kitchen levothyroxine (SYNTHROID, LEVOTHROID) 125 MCG tablet Take 1 tablet (125 mcg total) by mouth daily before breakfast. 90 tablet 3  . losartan-hydrochlorothiazide (HYZAAR) 100-25 MG per tablet Take 1 tablet by mouth daily.     . meclizine (ANTIVERT) 25 MG tablet Take 1 tablet (25 mg total) by mouth 3 (three) times daily as needed for dizziness. 90 tablet 0  . Omega-3 Fatty Acids (FISH OIL PO) Take 1 tablet by mouth daily.    . pantoprazole (PROTONIX) 40 MG tablet Take 1 tablet (40 mg total) by mouth 2 (two) times daily. 60 tablet 0  . Probiotic Product (PROBIOTIC ADVANCED PO) Take 1 capsule by mouth daily.     No current facility-administered medications for this visit.     Physical Exam:  BP 113/75 (BP Location: Left Arm, Patient Position: Sitting, Cuff Size: Normal)   Pulse 92   Temp (!) 96.4 F (35.8 C)   Resp 16   Ht 5\' 1"  (1.549 m)   Wt 135 lb (61.2 kg)   SpO2 95% Comment: RA  BMI 25.51 kg/m  She looks well. Cardiac exam shows a regular rate and rhythm with normal heart sounds.  There is no murmur. Lungs are  clear.  Diagnostic Tests:  CLINICAL DATA:  Thoracic aortic aneurysm, follow-up  EXAM: CT CHEST WITHOUT CONTRAST  TECHNIQUE: Multidetector CT imaging of the chest was performed following the standard protocol without IV contrast.  COMPARISON:  01/19/2019  FINDINGS: Cardiovascular:  Aortic Root:  --Sinuses: 3.5 cm  --Sinotubular Junction: 3.2 cm  Limitations by motion: Mild  Thoracic Aorta:  --Ascending Aorta: 4.1 cm (previously 4.1)  --Aortic Arch: 3 cm  --Descending Aorta: 3.2 cm  Other:  Normal heart size. Patchy aortic calcifications. Coronary calcifications. No pericardial effusion.  Mediastinum/Nodes: Post thyroidectomy. No hilar or mediastinal adenopathy, sensitivity decreased without IV contrast. Small hiatal hernia.  Lungs/Pleura: No pleural effusion. No pneumothorax. Calcified granuloma, left upper lobe (Im50,Se8) . 9 mm pleural-based nodule, posterior left lower lobe (Im62,Se8) , previously 7 mm in 2016. No new nodule. No focal infiltrate.  Upper Abdomen: No acute findings. Scattered colonic diverticula noted.  Musculoskeletal: Anterior vertebral endplate spurring at multiple levels in the mid and lower thoracic spine. No fracture or worrisome bone lesion.  Review of the MIP images confirms the above findings.  IMPRESSION: 1. Stable 4.1 cm ascending thoracic aortic aneurysm. Recommend annual imaging followup by CTA or MRA. This recommendation follows 2010 ACCF/AHA/AATS/ACR/ASA/SCA/SCAI/SIR/STS/SVM Guidelines for  the Diagnosis and Management of Patients with Thoracic Aortic Disease. Circulation. 2010; 121: [id number] 2. 9 mm left lower lobe pulmonary nodule, minimally increased since 2016, probably benign. 3. Coronary calcifications. The severity of coronary artery disease and any potential stenosis cannot be assessed on this non-gated CT examination.   Electronically Signed   By: Lucrezia Europe M.D.   On: 01/25/2020  12:06   Impression:  This 74 year old woman has a stable 4.1 cm fusiform ascending aortic aneurysm.  There is also a 9 mm left lower lobe pulmonary nodule which is minimally increased since 2016 and is most likely benign.  I reviewed the CT images with her and answered her questions.  I stressed the importance of continued good blood pressure control and preventing further enlargement of her aneurysm and acute aortic dissection.  This aneurysm is still well below the surgical threshold of 5.5 cm.  Plan:  She will return to see me in 1 year with a CT scan of the chest without contrast to follow-up on her ascending aortic aneurysm and small left lower lobe pulmonary nodule that is most likely benign.  I spent 20 minutes performing this established patient evaluation and > 50% of this time was spent face to face counseling and coordinating the care of this patient's aortic aneurysm and small left lower lobe pulmonary nodule.    Gaye Pollack, MD [facility] [phone]

## 2020-01-26 ENCOUNTER — Encounter: Payer: Self-pay | Admitting: Internal Medicine

## 2020-01-30 ENCOUNTER — Encounter: Payer: Self-pay | Admitting: Internal Medicine

## 2020-02-02 ENCOUNTER — Other Ambulatory Visit: Payer: Self-pay

## 2020-02-06 ENCOUNTER — Encounter: Payer: Self-pay | Admitting: Endocrinology

## 2020-02-06 ENCOUNTER — Ambulatory Visit: Payer: Medicare Other | Admitting: Endocrinology

## 2020-02-06 ENCOUNTER — Other Ambulatory Visit (INDEPENDENT_AMBULATORY_CARE_PROVIDER_SITE_OTHER): Payer: Medicare Other

## 2020-02-06 ENCOUNTER — Other Ambulatory Visit: Payer: Self-pay

## 2020-02-06 VITALS — BP 120/70 | HR 90 | Ht 61.0 in | Wt 137.0 lb

## 2020-02-06 DIAGNOSIS — E039 Hypothyroidism, unspecified: Secondary | ICD-10-CM

## 2020-02-06 LAB — T4, FREE: Free T4: 0.71 ng/dL (ref 0.60–1.60)

## 2020-02-06 LAB — TSH: TSH: 14.16 u[IU]/mL — ABNORMAL HIGH (ref 0.35–4.50)

## 2020-02-06 MED ORDER — LEVOTHYROXINE SODIUM 150 MCG PO TABS: 150.0000 ug | ORAL_TABLET | Freq: Every day | ORAL | 3 refills | Status: DC

## 2020-02-06 NOTE — Patient Instructions (Addendum)
Blood tests are requested for you today.  We'll let you know about the results.   It is best to never miss the medication.  However, if you do miss it, next best is to double up the next time.   Please come back for a follow-up appointment in 1 year.

## 2020-02-06 NOTE — Progress Notes (Signed)
Subjective:    Patient ID: Rachel Gardner, female    DOB: February 06, 1946, 74 y.o.   MRN: [id number]  HPI Pt returns for f/u of postsurgical hypothyroidism (she had thyroidectomy in 2005, for a goiter--was benign on pathology.  she has been on prescribed thyroid hormone therapy since then; synthroid dosage has varied from 125 to 200 mcg/d).  pt states she feels well in general.  She says she never misses the synthroid.   Past Medical History:  Diagnosis Date  . Allergy    hay fever  . Ascending aortic aneurysm ([place])   . Blood in stool   . BPPV (benign paroxysmal positional vertigo)   . CPAP (continuous positive airway pressure) dependence 02/09/2019  . Diverticulitis   . Fatty liver 05/28/10   per [facility] at [facility]  . Genital herpes   . GERD (gastroesophageal reflux disease)   . Glaucoma   . Helicobacter pylori infection   . Hiatal hernia 05/28/10   CT @ [facility]  . Hyperlipidemia   . Hypertension   . Hypothyroidism   . 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 Kidney stones   . Lung nodule seen on imaging study   . Meniere's disease   . Obstructive sleep apnea   . OSA on CPAP 04/13/2019   Moderate with AHI 20/hr now on CPAP at 13cm H2O    Past Surgical History:  Procedure Laterality Date  . ABDOMINAL HYSTERECTOMY  1995  . ANAL RECTAL MANOMETRY N/A 08/06/2016   Procedure: ANO RECTAL MANOMETRY;  Surgeon: Doran Stabler, MD;  Location: [facility];  Service: Gastroenterology;  Laterality: N/A;  . APPENDECTOMY  1989  . BPPV     x 4 after L cochlear implant  . BREAST SURGERY  1989   breast reduction  . CHOLECYSTECTOMY  1989  . COCHLEAR IMPLANT     Left/2009; Right/2012  . LAPAROSCOPIC SIGMOID COLECTOMY  2012   with takedown of splenic flexure; cystoscopy with bilateral ureteral stent placement  . REDUCTION MAMMAPLASTY Bilateral 1989   [place]  . TOTAL THYROIDECTOMY  1995    Social History   Socioeconomic History  . Marital status: Divorced   Spouse name: Not on file  . Number of children: 3  . Years of education: Not on file  . Highest education level: Not on file  Occupational History  . Occupation: retired Marine scientist  Tobacco Use  . Smoking status: Current Every Day Smoker    Packs/day: 0.50    Years: 41.00    Pack years: 20.50    Types: Cigarettes    Last attempt to quit: 01/18/2016    Years since quitting: 4.0  . Smokeless tobacco: Never Used  Substance and Sexual Activity  . Alcohol use: Yes    Alcohol/week: 0.0 standard drinks    Comment: rarely  . Drug use: No  . Sexual activity: Never  Other Topics Concern  . Not on file  Social History Narrative   Regular exercise: walk; 1 mile a day / walk 5 miles weekend;    Caffeine use: 1 cup of coffee in the AM      3 children      Social Determinants of Health   Financial Resource Strain:   . Difficulty of Paying Living Expenses: Not on file  Food Insecurity:   . Worried About Charity fundraiser in the Last Year: Not on file  . Ran Out of Food in the Last Year: Not on  file  Transportation Needs:   . Film/video editor (Medical): Not on file  . Lack of Transportation (Non-Medical): Not on file  Physical Activity:   . Days of Exercise per Week: Not on file  . Minutes of Exercise per Session: Not on file  Stress:   . Feeling of Stress : Not on file  Social Connections:   . Frequency of Communication with Friends and Family: Not on file  . Frequency of Social Gatherings with Friends and Family: Not on file  . Attends Religious Services: Not on file  . Active Member of Clubs or Organizations: Not on file  . Attends Archivist Meetings: Not on file  . Marital Status: Not on file  Intimate Partner Violence:   . Fear of Current or Ex-Partner: Not on file  . Emotionally Abused: Not on file  . Physically Abused: Not on file  . Sexually Abused: Not on file    Current Outpatient Medications on File Prior to Visit  Medication Sig Dispense Refill  .  ascorbic acid (VITAMIN C) 1000 MG tablet Take 500 mg by mouth daily.     Marland Kitchen aspirin 81 MG chewable tablet Chew 81 mg by mouth daily.     . bimatoprost (LUMIGAN) 0.01 % SOLN Place 1 drop into both eyes at bedtime.    . Calcium Carb-Cholecalciferol (CALCIUM 500 + D3) 500-600 MG-UNIT TABS Take 1 Dose by mouth daily.     . fluticasone (FLONASE) 50 MCG/ACT nasal spray Place 2 sprays into both nostrils at bedtime.    Marland Kitchen losartan-hydrochlorothiazide (HYZAAR) 100-25 MG per tablet Take 1 tablet by mouth daily.     . meclizine (ANTIVERT) 25 MG tablet Take 1 tablet (25 mg total) by mouth 3 (three) times daily as needed for dizziness. 90 tablet 0  . Omega-3 Fatty Acids (FISH OIL PO) Take 1 tablet by mouth daily.    . pantoprazole (PROTONIX) 40 MG tablet Take 1 tablet (40 mg total) by mouth 2 (two) times daily. 60 tablet 0  . Probiotic Product (PROBIOTIC ADVANCED PO) Take 1 capsule by mouth daily.     No current facility-administered medications on file prior to visit.    Allergies  Allergen Reactions  . Morphine And Related     Nausea Can tolerate Dilaudid  . Statins Other (See Comments)    Joint pain   . Tape Other (See Comments)    Causes redness and will take skin when removed  . Wellbutrin [Bupropion] Other (See Comments)    Suicidal intensions    Family History  Problem Relation Age of Onset  . Hypertension Mother   . Hypertension Father   . Hypertension Sister   . Prostate cancer Brother   . Hyperlipidemia Brother   . Colon cancer Maternal Aunt   . Prostate cancer Paternal Uncle   . Colon cancer Cousin        x 2; paternal  . Thyroid disease Sister     BP 120/70 (BP Location: Left Arm, Patient Position: Sitting, Cuff Size: Normal)   Pulse 90   Ht 5\' 1"  (1.549 m)   Wt 137 lb (62.1 kg)   SpO2 98%   BMI 25.89 kg/m    Review of Systems Denies insomnia.    Objective:   Physical Exam VITAL SIGNS:  See vs page GENERAL: no distress Neck: a healed scar is present.  I do not  appreciate a nodule in the thyroid or elsewhere in the neck.    Lab Results  Component Value Date   TSH 14.16 (H) 02/06/2020       Assessment & Plan:  Hypothyroidism, worse: I have sent a prescription to your pharmacy, to increase Recheck labs in 27month

## 2020-02-13 ENCOUNTER — Telehealth: Payer: Self-pay | Admitting: Internal Medicine

## 2020-02-13 DIAGNOSIS — R159 Full incontinence of feces: Secondary | ICD-10-CM | POA: Diagnosis not present

## 2020-02-13 NOTE — Progress Notes (Signed)
  Chronic Care Management   Outreach Note  02/13/2020 Name: Rachel Gardner MRN: [id number] DOB: [birth year]  Referred by: Hoyt Koch, MD Reason for referral : No chief complaint on file.   An unsuccessful telephone outreach was attempted today. The patient was referred to the pharmacist for assistance with care management and care coordination.   Follow Up Plan:    Raynicia Dukes [employer] Scheduler

## 2020-02-20 ENCOUNTER — Other Ambulatory Visit: Payer: Self-pay | Admitting: Internal Medicine

## 2020-02-22 ENCOUNTER — Ambulatory Visit: Payer: Medicare Other | Admitting: Neurology

## 2020-02-22 ENCOUNTER — Encounter: Payer: Self-pay | Admitting: Neurology

## 2020-02-22 ENCOUNTER — Other Ambulatory Visit: Payer: Self-pay

## 2020-02-22 VITALS — BP 137/90 | HR 79 | Temp 97.9°F | Ht 61.5 in | Wt 136.0 lb

## 2020-02-22 DIAGNOSIS — H8110 Benign paroxysmal vertigo, unspecified ear: Secondary | ICD-10-CM

## 2020-02-22 DIAGNOSIS — R42 Dizziness and giddiness: Secondary | ICD-10-CM

## 2020-02-22 MED ORDER — MECLIZINE HCL 25 MG PO TABS: 25.0000 mg | ORAL_TABLET | Freq: Three times a day (TID) | ORAL | 9 refills | Status: AC | PRN

## 2020-02-22 NOTE — Patient Instructions (Signed)
Meclizine as needed   Benign Positional Vertigo Vertigo is the feeling that you or your surroundings are moving when they are not. Benign positional vertigo is the most common form of vertigo. This is usually a harmless condition (benign). This condition is positional. This means that symptoms are triggered by certain movements and positions. This condition can be dangerous if it occurs while you are doing something that could cause harm to you or others. This includes activities such as driving or operating machinery. What are the causes? In many cases, the cause of this condition is not known. It may be caused by a disturbance in an area of the inner ear that helps your brain to sense movement and balance. This disturbance can be caused by:  Viral infection (labyrinthitis).  Head injury.  Repetitive motion, such as jumping, dancing, or running. What increases the risk? You are more likely to develop this condition if:  You are a woman.  You are 73 years of age or older. What are the signs or symptoms? Symptoms of this condition usually happen when you move your head or your eyes in different directions. Symptoms may start suddenly, and usually last for less than a minute. They include:  Loss of balance and falling.  Feeling like you are spinning or moving.  Feeling like your surroundings are spinning or moving.  Nausea and vomiting.  Blurred vision.  Dizziness.  Involuntary eye movement (nystagmus). Symptoms can be mild and cause only minor problems, or they can be severe and interfere with daily life. Episodes of benign positional vertigo may return (recur) over time. Symptoms may improve over time. How is this diagnosed? This condition may be diagnosed based on:  Your medical history.  Physical exam of the head, neck, and ears.  Tests, such as: ? MRI. ? CT scan. ? Eye movement tests. Your health care provider may ask you to change positions quickly while he or she  watches you for symptoms of benign positional vertigo, such as nystagmus. Eye movement may be tested with a variety of exams that are designed to evaluate or stimulate vertigo. ? An electroencephalogram (EEG). This records electrical activity in your brain. ? Hearing tests. You may be referred to a health care provider who specializes in ear, nose, and throat (ENT) problems (otolaryngologist) or a provider who specializes in disorders of the nervous system (neurologist). How is this treated?  This condition may be treated in a session in which your health care provider moves your head in specific positions to adjust your inner ear back to normal. Treatment for this condition may take several sessions. Surgery may be needed in severe cases, but this is rare. In some cases, benign positional vertigo may resolve on its own in 2-4 weeks. Follow these instructions at home: Safety  Move slowly. Avoid sudden body or head movements or certain positions, as told by your health care provider.  Avoid driving until your health care provider says it is safe for you to do so.  Avoid operating heavy machinery until your health care provider says it is safe for you to do so.  Avoid doing any tasks that would be dangerous to you or others if vertigo occurs.  If you have trouble walking or keeping your balance, try using a cane for stability. If you feel dizzy or unstable, sit down right away.  Return to your normal activities as told by your health care provider. Ask your health care provider what activities are safe for you.  General instructions  Take over-the-counter and prescription medicines only as told by your health care provider.  Drink enough fluid to keep your urine pale yellow.  Keep all follow-up visits as told by your health care provider. This is important. Contact a health care provider if:  You have a fever.  Your condition gets worse or you develop new symptoms.  Your family or  friends notice any behavioral changes.  You have nausea or vomiting that gets worse.  You have numbness or a "pins and needles" sensation. Get help right away if you:  Have difficulty speaking or moving.  Are always dizzy.  Faint.  Develop severe headaches.  Have weakness in your legs or arms.  Have changes in your hearing or vision.  Develop a stiff neck.  Develop sensitivity to light. Summary  Vertigo is the feeling that you or your surroundings are moving when they are not. Benign positional vertigo is the most common form of vertigo.  The cause of this condition is not known. It may be caused by a disturbance in an area of the inner ear that helps your brain to sense movement and balance.  Symptoms include loss of balance and falling, feeling that you or your surroundings are moving, nausea and vomiting, and blurred vision.  This condition can be diagnosed based on symptoms, physical exam, and other tests, such as MRI, CT scan, eye movement tests, and hearing tests.  Follow safety instructions as told by your health care provider. You will also be told when to contact your health care provider in case of problems. This information is not intended to replace advice given to you by your health care provider. Make sure you discuss any questions you have with your health care provider. Document Revised: 05/26/2018 Document Reviewed: 05/26/2018 Elsevier Patient Education  [place].

## 2020-02-22 NOTE — Progress Notes (Signed)
[facility]    Provider:  Dr Jaynee Eagles Referring Provider: Dr. Thornell Mule, ENT Primary Care Physician:  Hoyt Koch, MD  CC:  Sudden onset Dizziness and Nausea  Interval history 02/22/2020: Patient last seen by me in 2018 for BPPV and at that time she was doing extremely well, she was symptoms free, vestibular therapy helped a lot. I do think psychosocial factors were involved with her presentation and when we first saw her she was under stress, crying throughout the whole appointment. Started when eating breakfast, the room was spinning,   She had another episode, it was severe and she didn't have the medicine. She is doing her epley maneuvers. She got something over the counter whic helped a little. The meclizine helps a lot. She didn't have the meclizine, she stayed all day in bed with vertigo and imbalance and nausea.  She feels dizzy when she bends over, lightheaded. This is chronic and ongoing, no changes to symptoms similar to the past. No focal weakness or focal neuro deficits.   Interval history 10/25/2018:  lovely 74 y.o. female here as a referral from Dr. Thornell Mule  for dizziness, imbalance. She has a past medical history of hypertension, high cholesterol, glaucoma, cataracts, sleep apnea, cochlear implant, bilateral sensorineural hearing loss, tinnitus bilateral, vertiginous syndrome, hypothyroidism, BPPV. She was last seen in 2017 and was under extreme stress which was thought to be contributary, imaging was ordered and she was referred to vestibular therapy. Last month had an episode of vertigo in the middle of the night, the world circled her. She had nausea and dizziness. She could walk. She perfomed the Epley maneuvers with good results. No headache or migraines. A few days later she discovered the position that worsens the symptoms when moving around the fabric. She is completely resolved. Meclizine helped. She is completely better.   CTA head and neck 2017:  1.  Calcified aortic atherosclerosis. See Chest CTA reported separately today. 2. Soft and calcified bilateral carotid artery atherosclerosis but no associated stenosis. 3. 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 The dominant right vertebral artery is patent without stenosis despite some calcified plaque, and supplies the basilar. 4. The posterior circulation otherwise is negative. There is a fetal type origin of the left PCA. 5. Negative CT appearance of the brain. Previous bilateral cochlear implants. Mild chronic right mastoid effusion.  HPI:  Rylie Mcvaugh is a lovely 74 y.o. female here as a referral from Dr. Thornell Mule  for dizziness, imbalance. She has a past medical history of hypertension, high cholesterol, glaucoma, cataracts, sleep apnea, cochlear implant, bilateral sensorineural hearing loss, tinnitus bilateral, vertiginous syndrome, hypothyroidism, BPPV. She is currently under a lot stress. Patient and son have intense conversation today, she cries and admits she is depressed and she is smoking again. Patient was in yoga class  6 weeks ago, her head was below the body and she became dizzy and nauseated. She has a history of bppv. She has tinnitus happens 1-2x a day and lasts a few seconds, the dizziness only started after the yoga class, she describes the dizziness like she is "floating" she loses her balance, like she "misses air", like her body is moving, lasts seconds but happens every day and more associated with getting up quickly or quick movements. She lost her balance when she got up quickly and hit her toe on the corner  of her shelf but no fall. Sometime she is taking a shower and if drop something difficult for her to pick it up and difficult to get up. Son is here and provides information. She has not "worked out" in years, she is very complacent,  yoga was her attempt to restart physical activity and unfortunately she became dizzy.  She feels nauseated when she bends over, when she moves her head to the right she feels nauseated. The tinnitus started at yoga too. Also balance loss but she attributed it to age and disuse, when she gets up quickly she staggers or when turns her head too quickly. She has two dogs and when she bends down to pick up the dog's food and when she gets up she needs to hold onto somethine. She c/o chronic double vision after cataract removal. She smokes currently. Son is here and endorses her smoking. She is under a lot of stress, no headaches or migraines, no other associated symptoms or modifying factors.  Reviewed notes, labs and imaging from outside physicians, which showed:  Personally reviewed imaging and agree with the following CT head 2015: FINDINGS: There is artifact bilaterally from cochlear implants. Study is therefore somewhat less than optimal due to susceptibility artifact from the cochlear implants. The ventricles are normal in size and configuration. There is no demonstrable mass, hemorrhage, extra-axial fluid collection, or midline shift. No focal gray-white compartments are appreciable. No acute infarct is evident. The bony calvarium appears intact. There is opacification of several inferior mastoids on the right. Mastoids elsewhere clear. As noted above, cochlear implants are present bilaterally.  IMPRESSION: Study limited due to cochlear implants bilaterally with associated magnetic susceptibility artifact. Allowing for areas of limited visualization due to the artifact from the cochlear implants, no focal intracranial lesion is appreciable. No mass, hemorrhage, or extra-axial fluid collection is appreciable in particular. Note that there is opacification of multiple inferior mastoid air cells on the right.  Review of Systems: Patient complains of symptoms per HPI as well as the following  symptoms: dizziness, vertigo. Pertinent negatives per HPI. All others negative.   Social History   Socioeconomic History  . Marital status: Divorced    Spouse name: Not on file  . Number of children: 3  . Years of education: Not on file  . Highest education level: Not on file  Occupational History  . Occupation: retired Marine scientist  Tobacco Use  . Smoking status: Current Every Day Smoker    Packs/day: 0.50    Years: 41.00    Pack years: 20.50    Types: Cigarettes    Last attempt to quit: 01/18/2016    Years since quitting: 4.0  . Smokeless tobacco: Never Used  Substance and Sexual Activity  . Alcohol use: Yes    Alcohol/week: 0.0 standard drinks    Comment: rarely  . Drug use: No  . Sexual activity: Never  Other Topics Concern  . Not on file  Social History Narrative   Regular exercise: walk; 1 mile a day / walk 5 miles weekend;    update 02/22/2020 not exercising   Caffeine use: 1-2 cups of coffee per day   3 children   Lives alone with her dogs, son lives next door   Social Determinants of Health   Financial Resource Strain:   . Difficulty of Paying Living Expenses: Not on file  Food Insecurity:   . Worried About Charity fundraiser in the Last Year: Not on file  . Ran Out of  Food in the Last Year: Not on file  Transportation Needs:   . Lack of Transportation (Medical): Not on file  . Lack of Transportation (Non-Medical): Not on file  Physical Activity:   . Days of Exercise per Week: Not on file  . Minutes of Exercise per Session: Not on file  Stress:   . Feeling of Stress : Not on file  Social Connections:   . Frequency of Communication with Friends and Family: Not on file  . Frequency of Social Gatherings with Friends and Family: Not on file  . Attends Religious Services: Not on file  . Active Member of Clubs or Organizations: Not on file  . Attends Archivist Meetings: Not on file  . Marital Status: Not on file  Intimate Partner Violence:   . Fear of  Current or Ex-Partner: Not on file  . Emotionally Abused: Not on file  . Physically Abused: Not on file  . Sexually Abused: Not on file    Family History  Problem Relation Age of Onset  . Hypertension Mother   . Hypertension Father   . Hypertension Sister   . Prostate cancer Brother   . Hyperlipidemia Brother   . Colon cancer Maternal Aunt   . Prostate cancer Paternal Uncle   . Colon cancer Cousin        x 2; paternal  . Thyroid disease Sister     Past Medical History:  Diagnosis Date  . Allergy    hay fever  . Ascending aortic aneurysm ([place])   . Blood in stool   . BPPV (benign paroxysmal positional vertigo)   . CPAP (continuous positive airway pressure) dependence 02/09/2019  . Diverticulitis   . Fatty liver 05/28/10   per CT at [facility]  . Genital herpes   . GERD (gastroesophageal reflux disease)   . Glaucoma   . Helicobacter pylori infection   . Hiatal hernia 05/28/10   CT @ [facility]  . Hyperlipidemia   . Hypertension   . Hypothyroidism   . 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 Kidney stones   . Lung nodule seen on imaging study   . Meniere's disease   . Obstructive sleep apnea   . OSA on CPAP 04/13/2019   Moderate with AHI 20/hr now on CPAP at 13cm H2O    Past Surgical History:  Procedure Laterality Date  . ABDOMINAL HYSTERECTOMY  1995  . ANAL RECTAL MANOMETRY N/A 08/06/2016   Procedure: ANO RECTAL MANOMETRY;  Surgeon: Doran Stabler, MD;  Location: [facility];  Service: Gastroenterology;  Laterality: N/A;  . APPENDECTOMY  1989  . BPPV     x 4 after L cochlear implant  . BREAST SURGERY  1989   breast reduction  . CHOLECYSTECTOMY  1989  . COCHLEAR IMPLANT     Left/2009; Right/2012  . LAPAROSCOPIC SIGMOID COLECTOMY  2012   with takedown of splenic flexure; cystoscopy with bilateral ureteral stent placement  . REDUCTION MAMMAPLASTY Bilateral 1989   [place]  . TOTAL THYROIDECTOMY  1995    Current Outpatient Medications    Medication Sig Dispense Refill  . ascorbic acid (VITAMIN C) 1000 MG tablet Take 500 mg by mouth daily.     Marland Kitchen aspirin 81 MG chewable tablet Chew 81 mg by mouth daily.     . bimatoprost (LUMIGAN) 0.01 % SOLN Place 1 drop into both eyes at bedtime.    . Calcium Carb-Cholecalciferol (CALCIUM 500 + D3) 500-600 MG-UNIT TABS  Take 1 Dose by mouth daily.     . fluticasone (FLONASE) 50 MCG/ACT nasal spray Place 2 sprays into both nostrils at bedtime.    Marland Kitchen levothyroxine (SYNTHROID) 150 MCG tablet Take 1 tablet (150 mcg total) by mouth daily before breakfast. 90 tablet 3  . losartan-hydrochlorothiazide (HYZAAR) 100-25 MG per tablet Take 1 tablet by mouth daily.     . meclizine (ANTIVERT) 25 MG tablet Take 1 tablet (25 mg total) by mouth 3 (three) times daily as needed for dizziness. 90 tablet 9  . Omega-3 Fatty Acids (FISH OIL PO) Take 1 tablet by mouth daily.    . pantoprazole (PROTONIX) 40 MG tablet TAKE 1 TABLET(40 MG) BY MOUTH TWICE DAILY 180 tablet 1  . Probiotic Product (PROBIOTIC ADVANCED PO) Take 1 capsule by mouth daily.     No current facility-administered medications for this visit.    Allergies as of 02/22/2020 - Review Complete 02/22/2020  Allergen Reaction Noted  . Morphine and related  05/24/2014  . Statins Other (See Comments) 08/16/2013  . Tape Other (See Comments) 04/25/2015  . Wellbutrin [bupropion] Other (See Comments) 02/13/2015    Vitals: BP 137/90 (BP Location: Left Arm, Patient Position: Sitting)   Pulse 79   Temp 97.9 F (36.6 C) Comment: taken at front  Ht 5' 1.5" (1.562 m)   Wt 136 lb (61.7 kg)   BMI 25.28 kg/m  Last Weight:  Wt Readings from Last 1 Encounters:  02/22/20 136 lb (61.7 kg)   Last Height:   Ht Readings from Last 1 Encounters:  02/22/20 5' 1.5" (1.562 m)   Physical exam: Exam: Gen: NAD, conversant, well nourised, well groomed                      Neuro: Detailed Neurologic Exam  Speech:    Speech is normal; fluent and spontaneous with  normal comprehension.  Cognition:    The patient is oriented to person, place, and time;     recent and remote memory intact;     language fluent;     normal attention, concentration,     fund of knowledge Cranial Nerves:    The pupils are equal, round, and reactive to light. Visual fields are full to finger confrontation. Extraocular movements are intact. Trigeminal sensation is intact and the muscles of mastication are normal. The face is symmetric. The palate elevates in the midline. Hearing intact. Voice is normal. Shoulder shrug is normal. The tongue has normal motion without fasciculations.   Gait:    Normal gait  Motor Observation:    No asymmetry, no atrophy, and no involuntary movements noted. Tone:    Normal muscle tone.    Posture:    Posture is normal. normal erect    Strength:    Strength is V/V in the upper and lower limbs.      Sensation: intact to LT, pin prick, temperature, vibration, proprioception, neg Romberg     Reflex Exam:  DTR's:    Deep tendon reflexes in the upper and lower extremities are normal bilaterally.          Assessment/Plan:    ZAHNIYA GREENWALT is an absolutely lovely 74  y.o., today returns to clinic with another episode of BPPV. PMHX BPPV, dizziness, imbalance. Epley maneuvers helped in the past. No focal neurologic deficits. Patient last seen by me in 2019 for BPPV and at that time she was doing extremely well, she was symptom free, vestibular therapy helped a lot  as did the epley maneuvers. I do think psychosocial factors were significantly involved with her initial presentation and severity of symptoms - when we first saw her for worsening vertigo she was under stress, crying throughout the whole appointment and stated she was depressed. When I saw her again she felt better, under less stress and her BPPV symptoms were improved.  CTA of the head and neck were ordered and she was referred to vestibular therapy in 2017 and 2019 and she has been  successful at performing the Epley maneuvers. We gave her more meclizine, she can do the Epley maneuvers and today she is completely symptom free.    PRIOR:   Depression, stress: She is crying in the office today, admits she is depressed and stressed. Discussed with son and patient that she should seek therapy and discuss with primary care possible medication treatment for her depression such as an SSRI.   Vascular risk factors for stroke: She should stop smoking. She should take a daily baby aspirin for stroke prevention. She needs to follow with primary care for management of vascular risk factors for stroke such as smoking cessation, cholesterol and blood pressure management, monitoring for diabetes. Patient has a history of sleep apnea, she should follow with her sleep doctor in the past or we can discuss a new study in the future if needed.  Dizziness, imbalance: We'll order physical therapy for both safety and gait, balance and vestibular exercises. Discussed regular exercise and looking into Silver Sneakers. Will order CT head.  CTA of the head and neck to evaluate for intracranial stenosis or carotid stenosis or other intracranial or vascular etiologies for her dizziness, postural dizziness with pre-syncope, lightheadedness. Will check labs today.   Orthostatic vital signs are normal in the office today. Discussed hydration and good diet.   Patient will follow-up with me after physical therapy.  Cc: Dr. Laney Potash, MD  [facility] [address] [place], [place] [address]  Phone [phone] Fax [phone]  A total of 15 minutes was spent face-to-face with this patient. Over half this time was spent on counseling patient on the  1. Benign paroxysmal positional vertigo, unspecified laterality   2. Vertigo     diagnosis and different diagnostic and therapeutic options, counseling and coordination of care, risks and benefits of  management, compliance, or risk factor reduction and education.

## 2020-02-23 ENCOUNTER — Ambulatory Visit: Payer: Medicare Other | Attending: Surgery | Admitting: Physical Therapy

## 2020-02-23 ENCOUNTER — Other Ambulatory Visit: Payer: Self-pay

## 2020-02-23 DIAGNOSIS — R279 Unspecified lack of coordination: Secondary | ICD-10-CM | POA: Diagnosis not present

## 2020-02-23 DIAGNOSIS — M6281 Muscle weakness (generalized): Secondary | ICD-10-CM | POA: Insufficient documentation

## 2020-02-23 NOTE — Therapy (Addendum)
[facility] [address] [place], [place], [address] Phone: [phone]   Fax:  [phone]  Physical Therapy Evaluation  Patient Details  Name: Rachel Gardner MRN: [id number] Date of Birth: [birth year] Referring Provider (PT): Ileana Roup, MD   Encounter Date: 02/23/2020  PT End of Session - 02/23/20 1418    Visit Number  1    Date for PT Re-Evaluation  05/17/20    PT Start Time  1400    PT Stop Time  1440    PT Time Calculation (min)  40 min    Activity Tolerance  Patient tolerated treatment well    Behavior During Therapy  [facility] for tasks assessed/performed       Past Medical History:  Diagnosis Date  . Allergy    hay fever  . Ascending aortic aneurysm ([place])   . Blood in stool   . BPPV (benign paroxysmal positional vertigo)   . CPAP (continuous positive airway pressure) dependence 02/09/2019  . Diverticulitis   . Fatty liver 05/28/10   per CT at [facility]  . Genital herpes   . GERD (gastroesophageal reflux disease)   . Glaucoma   . Helicobacter pylori infection   . Hiatal hernia 05/28/10   CT @ [facility]  . Hyperlipidemia   . Hypertension   . Hypothyroidism   . 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 Kidney stones   . Lung nodule seen on imaging study   . Meniere's disease   . Obstructive sleep apnea   . OSA on CPAP 04/13/2019   Moderate with AHI 20/hr now on CPAP at 13cm H2O    Past Surgical History:  Procedure Laterality Date  . ABDOMINAL HYSTERECTOMY  1995  . ANAL RECTAL MANOMETRY N/A 08/06/2016   Procedure: ANO RECTAL MANOMETRY;  Surgeon: Doran Stabler, MD;  Location: [facility];  Service: Gastroenterology;  Laterality: N/A;  . APPENDECTOMY  1989  . BPPV     x 4 after L cochlear implant  . BREAST SURGERY  1989   breast reduction  . CHOLECYSTECTOMY  1989  . COCHLEAR IMPLANT     Left/2009; Right/2012  . LAPAROSCOPIC SIGMOID COLECTOMY  2012   with  takedown of splenic flexure; cystoscopy with bilateral ureteral stent placement  . REDUCTION MAMMAPLASTY Bilateral 1989   [place]  . TOTAL THYROIDECTOMY  1995    There were no vitals filed for this visit.   Subjective Assessment - 02/23/20 1402    Subjective  Pt had section of descending colon removed in 2012.  Pt states she cannot hold a loose stool and has to push on pelvic floor to have a bowel movement.  Pt has been like this for 3 years    Pertinent History  surgery to remove part of descending colon    Patient Stated Goals  stop leaking    Currently in Pain?  No/denies         [facility] PT Assessment - 02/24/20 0001      Assessment   Medical Diagnosis  R15.9 (ICD-10-CM) - Full incontinence of feces    Referring Provider (PT)  Dema Severin, Sharon Mt, MD    Onset Date/Surgical Date  --   3 years   Prior Therapy  --      Precautions   Precautions  None      Restrictions   Weight Bearing Restrictions  No      Balance Screen   Has the  patient fallen in the past 6 months  No      [place] residence    Living Arrangements  Alone    Type of [place] to enter    Entrance Stairs-Number of Steps  2    [place]  One level      Prior Function   Level of [place]  Retired   retired Paramedic   Overall Cognitive Status  Within Functional Limits for tasks assessed      Posture/Postural Control   Posture/Postural Control  Postural limitations    Postural Limitations  Rounded Shoulders;Anterior pelvic tilt      ROM / Strength   AROM / PROM / Strength  PROM;Strength      PROM   Overall PROM Comments  Lt hip 40% limited rotation; Rt hip 25% limited rotation      Strength   Overall Strength Comments  core weakness noted with palpation      Flexibility   Soft Tissue Assessment /Muscle Length  yes    Hamstrings  70%      Palpation   Palpation comment  hamstring and  lumbar paraspinals tight      Ambulation/Gait   Gait Pattern  Within Functional Limits      Balance   Balance Assessed  No      Static Standing Balance   Static Standing - Balance Support  --    Static Standing - Level of Assistance  --    Static Standing Balance -  Activities   --       PFIQ-7 - summary score = 73 (bladder and urine)         Objective measurements completed on examination: See above findings.    Pelvic Floor Special Questions - 02/24/20 0001    Prior Pelvic/Prostate Exam  Yes    Date of Last Pelvic/Prostate Exam  --   anorectal mamography   Prior Pregnancies  Yes    Number of Pregnancies  3    Number of Vaginal Deliveries  3    Any difficulty with labor and deliveries  Yes    Episiotomy Performed  Yes    Currently Sexually Active  No   in the foreseeable future if can manage leakage   Urinary Leakage  Yes    How often  3+/day fecal and more gas leakage; small amount of urinary with strong urge    Pad use  3x/day    Activities that cause leaking  With strong urge    Urinary urgency  Yes    Urinary frequency  2 hours    Fecal incontinence  Yes    Skin Integrity  Intact    Perineal Body/Introitus   Descended    Pelvic Floor Internal Exam  pt identity confirmed and infomred consent given to perform internal soft tissue assessment    Exam Type  Rectal    Strength  weak squeeze, no lift    Strength # of seconds  4    Tone  low       [facility] Adult PT Treatment/Exercise - 02/24/20 0001      Self-Care   Self-Care  Other Self-Care Comments    Other Self-Care Comments   urge drills               PT Short Term Goals - 02/24/20 [id number]  PT SHORT TERM GOAL #1   Title  Pt will be ind with urge drills    Time  2    Period  Weeks    Status  New    Target Date  03/08/20      PT SHORT TERM GOAL #2   Title  Pt will be ind with basic core and pelvic floor strengthening exercises    Time  4    Period  Weeks    Status  New    Target Date   03/22/20        PT Long Term Goals - 02/24/20 0819      PT LONG TERM GOAL #1   Title  Independent in HEP    Baseline  .Marland Kitchen    Time  12    Period  Weeks    Status  New    Target Date  05/17/20      PT LONG TERM GOAL #2   Title  Pt will demonstrate understanding of improved sitting and standing posture    Baseline  .Marland Kitchen    Time  12    Period  Weeks    Status  New    Target Date  05/17/20      PT LONG TERM GOAL #3   Title  Pt will report only needs 1 pad/day    Baseline  3+    Time  12    Period  Weeks    Status  New    Target Date  05/17/20      PT LONG TERM GOAL #4   Title  Pt will report 50% less urgency    Time  12    Period  Weeks    Status  New    Target Date  05/17/20      PT LONG TERM GOAL #5   Title  Pt will report she does not have to use hand to support the perineum during a bowel movement due to improved strength and coordination    Time  12    Period  Weeks    Status  New    Target Date  05/17/20             Plan - 02/23/20 1438    Clinical Impression Statement  Pt presents to skilled PT due to incontinence of feces that has been going on for several years.  Pt states she has managed with it, but it is limiting her activities and social life.  Pt demosntrates 2/5 MMT of pelvic floor rectally and very low endurnace of 4 sec hold.  She has decreased coordination and unable to contract and relax pelvic floor for evacuation from rectum.  Pt has tight hamstrings and decreased hip mobility.  Pt demonstrates increased anterior pelvic tilt and rounded shoulder.  She will benefit from skilled PT to address impairments and improve functional activities and quality of life.    Personal Factors and Comorbidities  Time since onset of injury/illness/exacerbation    Examination-Activity Limitations  Toileting;Continence    Stability/Clinical Decision Making  Stable/Uncomplicated    Clinical Decision Making  Low    Rehab Potential  Excellent    PT Frequency  2x /  week    PT Duration  12 weeks    PT Treatment/Interventions  ADLs/Self Care Home Management;Biofeedback;Cryotherapy;Electrical Stimulation;Moist Heat;Therapeutic activities;Therapeutic exercise;Neuromuscular re-education;Patient/family education;Manual techniques;Dry needling;Passive range of motion    PT Next Visit Plan  biofeedback    PT Home Exercise Plan  urge drills  given today    Consulted and Agree with Plan of Care  Patient       Patient will benefit from skilled therapeutic intervention in order to improve the following deficits and impairments:  Postural dysfunction, Decreased strength, Decreased coordination  Visit Diagnosis: Muscle weakness (generalized)  Unspecified lack of coordination     Problem List Patient Active Problem List   Diagnosis Date Noted  . Right upper quadrant pain 01/23/2020  . OSA on CPAP 04/13/2019  . Lymphadenopathy, retroperitoneal 02/03/2019  . BPPV (benign paroxysmal positional vertigo) 10/25/2018  . GERD (gastroesophageal reflux disease) 06/04/2017  . Smokers' cough ([place]) 03/19/2016  . Aortic atherosclerosis ([place]) 03/19/2016  . Dilated aortic root ([place]) 03/19/2016  . Routine general medical examination at a health care facility 09/25/2015  . Thoracic ascending aortic aneurysm ([place])   . Lung nodule seen on imaging study   . Chronic midline posterior neck pain 08/04/2014  . Hematuria, microscopic 05/31/2014  . Vitamin D deficiency 05/31/2014  . Arthralgia 05/24/2014  . Fecal incontinence 05/24/2014  . Hyperlipidemia 10/03/2013  . Acquired hypothyroidism 08/16/2013  . Glaucoma 08/16/2013  . Allergic rhinitis 08/16/2013  . HTN (hypertension) 08/16/2013    Jule Ser, PT 02/24/2020, 8:36 AM  [facility] [address], [place], [place], [address] Phone: [phone]   Fax:  [phone]  Name: Rachel Gardner MRN: [id number] Date of Birth: Mar 25, 1946

## 2020-02-24 ENCOUNTER — Encounter: Payer: Self-pay | Admitting: Physical Therapy

## 2020-02-26 DIAGNOSIS — R42 Dizziness and giddiness: Secondary | ICD-10-CM

## 2020-02-26 DIAGNOSIS — H8110 Benign paroxysmal vertigo, unspecified ear: Secondary | ICD-10-CM

## 2020-02-27 DIAGNOSIS — H401131 Primary open-angle glaucoma, bilateral, mild stage: Secondary | ICD-10-CM | POA: Diagnosis not present

## 2020-02-27 DIAGNOSIS — Z961 Presence of intraocular lens: Secondary | ICD-10-CM | POA: Diagnosis not present

## 2020-03-06 ENCOUNTER — Encounter: Payer: Self-pay | Admitting: Physical Therapy

## 2020-03-06 ENCOUNTER — Other Ambulatory Visit: Payer: Self-pay

## 2020-03-06 ENCOUNTER — Ambulatory Visit: Payer: Medicare Other | Attending: Surgery | Admitting: Physical Therapy

## 2020-03-06 DIAGNOSIS — M6281 Muscle weakness (generalized): Secondary | ICD-10-CM | POA: Diagnosis not present

## 2020-03-06 DIAGNOSIS — R279 Unspecified lack of coordination: Secondary | ICD-10-CM | POA: Diagnosis not present

## 2020-03-06 DIAGNOSIS — R42 Dizziness and giddiness: Secondary | ICD-10-CM | POA: Diagnosis not present

## 2020-03-06 NOTE — Patient Instructions (Signed)
Access Code: [email or web address] URL: [email or web address] Date: 03/06/2020 Prepared by: Jari Favre  Exercises Mini Squat with Pelvic Floor Contraction - 10 reps - 3 sets - 1x daily - 7x weekly Supine Pelvic Floor Contraction - 10 reps - 1 sets - 3 sec hold - 3x daily - 7x weekly Supine Hip Adductor Squeeze with Small Ball - 10 reps - 1 sets - 3 sec hold - 3x daily - 7x weekly Standing Hamstring Stretch with Step - 3 reps - 1 sets - 30 sec hold - 1x daily - 7x weekly

## 2020-03-06 NOTE — Therapy (Signed)
[facility] [address] [place], [place], [address] Phone: [phone]   Fax:  [phone]  Physical Therapy Treatment  Patient Details  Name: Rachel Gardner MRN: [id number] Date of Birth: 12-09-1946 Referring Provider (PT): Ileana Roup, MD   Encounter Date: 03/06/2020  PT End of Session - 03/06/20 0815    Visit Number  2    Date for PT Re-Evaluation  05/17/20    PT Start Time  0800    PT Stop Time  0840    PT Time Calculation (min)  40 min    Activity Tolerance  Patient tolerated treatment well    Behavior During Therapy  [facility] for tasks assessed/performed       Past Medical History:  Diagnosis Date  . Allergy    hay fever  . Ascending aortic aneurysm ([place])   . Blood in stool   . BPPV (benign paroxysmal positional vertigo)   . CPAP (continuous positive airway pressure) dependence 02/09/2019  . Diverticulitis   . Fatty liver 05/28/10   per CT at [facility]  . Genital herpes   . GERD (gastroesophageal reflux disease)   . Glaucoma   . Helicobacter pylori infection   . Hiatal hernia 05/28/10   CT @ [facility]  . Hyperlipidemia   . Hypertension   . Hypothyroidism   . 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 Kidney stones   . Lung nodule seen on imaging study   . Meniere's disease   . Obstructive sleep apnea   . OSA on CPAP 04/13/2019   Moderate with AHI 20/hr now on CPAP at 13cm H2O    Past Surgical History:  Procedure Laterality Date  . ABDOMINAL HYSTERECTOMY  1995  . ANAL RECTAL MANOMETRY N/A 08/06/2016   Procedure: ANO RECTAL MANOMETRY;  Surgeon: Doran Stabler, MD;  Location: [facility];  Service: Gastroenterology;  Laterality: N/A;  . APPENDECTOMY  1989  . BPPV     x 4 after L cochlear implant  . BREAST SURGERY  1989   breast reduction  . CHOLECYSTECTOMY  1989  . COCHLEAR IMPLANT     Left/2009; Right/2012  . LAPAROSCOPIC SIGMOID COLECTOMY  2012   with  takedown of splenic flexure; cystoscopy with bilateral ureteral stent placement  . REDUCTION MAMMAPLASTY Bilateral 1989   [place]  . TOTAL THYROIDECTOMY  1995    There were no vitals filed for this visit.  Subjective Assessment - 03/06/20 0826    Subjective  It was better using the urge to void techniques.  I had one accident since last visit    Pertinent History  surgery to remove part of descending colon    Patient Stated Goals  stop leaking    Currently in Pain?  No/denies                       [facility] Adult PT Treatment/Exercise - 03/06/20 0001      Neuro Re-ed    Neuro Re-ed Details   biofeedback; resting tone lowered in supine LE extended; biofeedback to give feedback and verbal cues; Pt needed cues to breath into belly she has difficutly with high tone in sitting and standing      Exercises   Exercises  Lumbar      Lumbar Exercises: Stretches   Active Hamstring Stretch  Right;Left;2 reps;20 seconds    Single Knee to Chest Stretch  Right;Left;2 reps;10 seconds  Figure 4 Stretch  2 reps;20 seconds      Lumbar Exercises: Standing   Functional Squats  10 reps   with kegel   Other Standing Lumbar Exercises  kegel      Lumbar Exercises: Seated   Other Seated Lumbar Exercises  kegel      Lumbar Exercises: Supine   AB Set Limitations  kegel with and without ball squeeze             PT Education - 03/06/20 0846    Education Details  Access Code: [email or web address]    Person(s) Educated  Patient    Methods  Explanation;Demonstration;Handout    Comprehension  Verbalized understanding;Returned demonstration       PT Short Term Goals - 02/24/20 0822      PT SHORT TERM GOAL #1   Title  Pt will be ind with urge drills    Time  2    Period  Weeks    Status  New    Target Date  03/08/20      PT SHORT TERM GOAL #2   Title  Pt will be ind with basic core and pelvic floor strengthening exercises    Time  4    Period  Weeks    Status  New    Target Date   03/22/20        PT Long Term Goals - 02/24/20 0819      PT LONG TERM GOAL #1   Title  Independent in HEP    Baseline  .Marland Kitchen    Time  12    Period  Weeks    Status  New    Target Date  05/17/20      PT LONG TERM GOAL #2   Title  Pt will demonstrate understanding of improved sitting and standing posture    Baseline  .Marland Kitchen    Time  12    Period  Weeks    Status  New    Target Date  05/17/20      PT LONG TERM GOAL #3   Title  Pt will report only needs 1 pad/day    Baseline  3+    Time  12    Period  Weeks    Status  New    Target Date  05/17/20      PT LONG TERM GOAL #4   Title  Pt will report 50% less urgency    Time  12    Period  Weeks    Status  New    Target Date  05/17/20      PT LONG TERM GOAL #5   Title  Pt will report she does not have to use hand to support the perineum during a bowel movement due to improved strength and coordination    Time  12    Period  Weeks    Status  New    Target Date  05/17/20            Plan - 03/06/20 0846    Clinical Impression Statement  Pt beginning with pelvic floor training today.  She has higher than normal resting tone.  She was able to perform basic exercises and educated on longer rest and breathing to lengthen and relax.    PT Treatment/Interventions  ADLs/Self Care Home Management;Biofeedback;Cryotherapy;Electrical Stimulation;Moist Heat;Therapeutic activities;Therapeutic exercise;Neuromuscular re-education;Patient/family education;Manual techniques;Dry needling;Passive range of motion    PT Next Visit Plan  biofeedback    Consulted and Agree with Plan of  Care  Patient       Patient will benefit from skilled therapeutic intervention in order to improve the following deficits and impairments:  Postural dysfunction, Decreased strength, Decreased coordination  Visit Diagnosis: Muscle weakness (generalized)  Unspecified lack of coordination  Dizziness and giddiness     Problem List Patient Active Problem List    Diagnosis Date Noted  . Right upper quadrant pain 01/23/2020  . OSA on CPAP 04/13/2019  . Lymphadenopathy, retroperitoneal 02/03/2019  . BPPV (benign paroxysmal positional vertigo) 10/25/2018  . GERD (gastroesophageal reflux disease) 06/04/2017  . Smokers' cough ([place]) 03/19/2016  . Aortic atherosclerosis ([place]) 03/19/2016  . Dilated aortic root ([place]) 03/19/2016  . Routine general medical examination at a health care facility 09/25/2015  . Thoracic ascending aortic aneurysm ([place])   . Lung nodule seen on imaging study   . Chronic midline posterior neck pain 08/04/2014  . Hematuria, microscopic 05/31/2014  . Vitamin D deficiency 05/31/2014  . Arthralgia 05/24/2014  . Fecal incontinence 05/24/2014  . Hyperlipidemia 10/03/2013  . Acquired hypothyroidism 08/16/2013  . Glaucoma 08/16/2013  . Allergic rhinitis 08/16/2013  . HTN (hypertension) 08/16/2013    Jule Ser, PT 03/06/2020, 8:48 AM  [facility] [address], [place], [place], [address] Phone: [phone]   Fax:  [phone]  Name: Rachel Gardner MRN: [id number] Date of Birth: 1946-10-28

## 2020-03-13 ENCOUNTER — Encounter: Payer: Self-pay | Admitting: Physical Therapy

## 2020-03-13 ENCOUNTER — Ambulatory Visit: Payer: Medicare Other | Attending: Surgery | Admitting: Physical Therapy

## 2020-03-13 ENCOUNTER — Other Ambulatory Visit: Payer: Self-pay

## 2020-03-13 DIAGNOSIS — R42 Dizziness and giddiness: Secondary | ICD-10-CM | POA: Diagnosis not present

## 2020-03-13 DIAGNOSIS — R2681 Unsteadiness on feet: Secondary | ICD-10-CM | POA: Diagnosis not present

## 2020-03-13 DIAGNOSIS — H8112 Benign paroxysmal vertigo, left ear: Secondary | ICD-10-CM | POA: Diagnosis not present

## 2020-03-13 DIAGNOSIS — H8111 Benign paroxysmal vertigo, right ear: Secondary | ICD-10-CM | POA: Diagnosis not present

## 2020-03-14 NOTE — Therapy (Signed)
[facility] [address] [place], [place], [address] Phone: [phone]   Fax:  [phone]  Physical Therapy Evaluation  Patient Details  Name: Rachel Gardner MRN: [id number] Date of Birth: Mar 10, 1946 Referring Provider (PT): Sarina Ill, MD   Encounter Date: 03/13/2020  PT End of Session - 03/14/20 1232    Visit Number  1   visit 1 for vertigo   Number of Visits  4    Date for PT Re-Evaluation  04/13/20    Authorization Type  BCBS Medicare    Authorization Time Period  03-13-20 - 06-11-20    PT Start Time  0850    PT Stop Time  0932    PT Time Calculation (min)  42 min    Activity Tolerance  Patient tolerated treatment well    Behavior During Therapy  [facility] for tasks assessed/performed       Past Medical History:  Diagnosis Date  . Allergy    hay fever  . Ascending aortic aneurysm ([place])   . Blood in stool   . BPPV (benign paroxysmal positional vertigo)   . CPAP (continuous positive airway pressure) dependence 02/09/2019  . Diverticulitis   . Fatty liver 05/28/10   per CT at [facility]  . Genital herpes   . GERD (gastroesophageal reflux disease)   . Glaucoma   . Helicobacter pylori infection   . Hiatal hernia 05/28/10   CT @ [facility]  . Hyperlipidemia   . Hypertension   . Hypothyroidism   . 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 Kidney stones   . Lung nodule seen on imaging study   . Meniere's disease   . Obstructive sleep apnea   . OSA on CPAP 04/13/2019   Moderate with AHI 20/hr now on CPAP at 13cm H2O    Past Surgical History:  Procedure Laterality Date  . ABDOMINAL HYSTERECTOMY  1995  . ANAL RECTAL MANOMETRY N/A 08/06/2016   Procedure: ANO RECTAL MANOMETRY;  Surgeon: Doran Stabler, MD;  Location: [facility];  Service: Gastroenterology;  Laterality: N/A;  . APPENDECTOMY  1989  . BPPV     x 4 after L cochlear implant  . BREAST SURGERY  1989   breast reduction  .  CHOLECYSTECTOMY  1989  . COCHLEAR IMPLANT     Left/2009; Right/2012  . LAPAROSCOPIC SIGMOID COLECTOMY  2012   with takedown of splenic flexure; cystoscopy with bilateral ureteral stent placement  . REDUCTION MAMMAPLASTY Bilateral 1989   [place]  . TOTAL THYROIDECTOMY  1995    There were no vitals filed for this visit.   Subjective Assessment - 03/13/20 0853    Subjective  Pt reports episode of vertigo started on Sat., Jan. 9th while she was moving things and cleaning in preparation of getting a house ready for rental; pt states she feels nauseous but not true dizziness    Pertinent History  surgery to remove part of descending colon    Patient Stated Goals  resolve the vertigo - find out why the dizziness is lingering for 2 months    Currently in Pain?  No/denies         [facility] PT Assessment - 03/14/20 0001      Assessment   Medical Diagnosis  BPPV    Referring Provider (PT)  Sarina Ill, MD    Onset Date/Surgical Date  01/07/20    Prior Therapy  pt had PT for BPPV in Nov. 2017 and  Jan. 2018 (1 visit each)       Precautions   Precautions  None      Restrictions   Weight Bearing Restrictions  No      Balance Screen   Has the patient fallen in the past 6 months  No      [place] residence    Living Arrangements  Alone    Type of [place] to enter    Entrance Stairs-Number of Steps  2    [place]  One level      Prior Function   Level of Independence  Independent    Vocation  Retired   retired Therapist, sports     Observation/Other Assessments   Focus on [place] ([place])   was set up but was not completed - reason unknown           Vestibular Assessment - 03/14/20 0001      Symptom Behavior   Subjective history of current problem  pt reports initial episode of vertigo started on 01-07-20 after having done alot of bending and moving items in preparation of getting a house ready for rental     Type of Dizziness   Spinning    Frequency of Dizziness  varies    Duration of Dizziness  secs to minutes now    Symptom Nature  Positional    Aggravating Factors  Looking up to the ceiling;Mornings;Rolling to right    Relieving Factors  Head stationary;Lying supine    History of similar episodes  in 2017 -early 2018      Positional Testing   Dix-Hallpike  Dix-Hallpike Right;Dix-Hallpike Left    Sidelying Test  Sidelying Right;Sidelying Left      Dix-Hallpike Right   Dix-Hallpike Right Duration  approx. 15 secs    Dix-Hallpike Right Symptoms  No nystagmus      Dix-Hallpike Left   Dix-Hallpike Left Duration  none    Dix-Hallpike Left Symptoms  No nystagmus      Sidelying Right   Sidelying Right Duration  none but c/o dizziness w/return to upright     Sidelying Right Symptoms  No nystagmus      Sidelying Left   Sidelying Left Duration  none    Sidelying Left Symptoms  No nystagmus          Objective measurements completed on examination: See above findings.       Epley maneuver for Rt BPPV completed 1 rep due to mod. To severe c/o dizziness in Rt Dix-Hallpike test position           PT Short Term Goals - 02/24/20 [id number]      PT SHORT TERM GOAL #1   Title  Pt will be ind with urge drills    Time  2    Period  Weeks    Status  New    Target Date  03/08/20      PT SHORT TERM GOAL #2   Title  Pt will be ind with basic core and pelvic floor strengthening exercises    Time  4    Period  Weeks    Status  New    Target Date  03/22/20        PT Long Term Goals - 03/14/20 1238      PT LONG TERM GOAL #1   Baseline  .Marland Kitchen      PT LONG TERM GOAL #  2   Baseline  .Marland Kitchen      Additional Long Term Goals   Additional Long Term Goals  --      PT LONG TERM GOAL #6   Title  Pt will have (-) Rt Dix-Hallpike test with no c/o vertigo and no nystagmus in test position to indicate resolution of Rt BPPV.    Baseline  c/o severe vertigo in test position - no nystagmus noted     Time  4    Period  Weeks    Status  New    Target Date  04/13/20      PT LONG TERM GOAL #7   Title  Pt will be independent in Epley maneuver and/or Brandt-Daroff exs for self treatment prn.    Time  4    Period  Weeks    Status  New    Target Date  04/13/20      PT LONG TERM GOAL #8   Title  Pt will report no dizziness with bed mobility or with any movement.    Time  4    Period  Weeks    Status  New    Target Date  04/13/20             Plan - 03/14/20 1233    Clinical Impression Statement  Pt reported dizziness in Rt Dix-Hallpike test position but no nystagmus noted; no c/o vertigo in Lt Dix-Hallpike test position nor in Lt sidelying position.  Pt reported minimal dizziness in Rt sidelying position but reported more dizziness with Rt sidelying to sitting compared to Lt sidelying to sitting position.  Will continue to assess for Rt posterior canalithiasis BPPV.    Personal Factors and Comorbidities  Comorbidity 1;Age;Time since onset of injury/illness/exacerbation    Examination-Activity Limitations  Locomotion Level;Bed Mobility;Bend;Transfers    Examination-Participation Restrictions  Cleaning;Community Activity;Meal Prep;Laundry    Stability/Clinical Decision Making  Stable/Uncomplicated    Clinical Decision Making  Low    Rehab Potential  Good    PT Frequency  1x / week    PT Duration  4 weeks   for vestibular rehab   PT Treatment/Interventions  ADLs/Self Care Home Management;Neuromuscular re-education;Vestibular;Canalith Repostioning;Patient/family education;Therapeutic activities;Therapeutic exercise    PT Next Visit Plan  reassess for Rt BPPV    Consulted and Agree with Plan of Care  Patient       Patient will benefit from skilled therapeutic intervention in order to improve the following deficits and impairments:  Dizziness, Decreased balance, Difficulty walking  Visit Diagnosis: BPPV (benign paroxysmal positional vertigo), right - Plan: PT plan of care  cert/re-cert  Dizziness and giddiness - Plan: PT plan of care cert/re-cert  Unsteadiness on feet - Plan: PT plan of care cert/re-cert     Problem List Patient Active Problem List   Diagnosis Date Noted  . Right upper quadrant pain 01/23/2020  . OSA on CPAP 04/13/2019  . Lymphadenopathy, retroperitoneal 02/03/2019  . BPPV (benign paroxysmal positional vertigo) 10/25/2018  . GERD (gastroesophageal reflux disease) 06/04/2017  . Smokers' cough ([place]) 03/19/2016  . Aortic atherosclerosis ([place]) 03/19/2016  . Dilated aortic root ([place]) 03/19/2016  . Routine general medical examination at a health care facility 09/25/2015  . Thoracic ascending aortic aneurysm ([place])   . Lung nodule seen on imaging study   . Chronic midline posterior neck pain 08/04/2014  . Hematuria, microscopic 05/31/2014  . Vitamin D deficiency 05/31/2014  . Arthralgia 05/24/2014  . Fecal incontinence 05/24/2014  . Hyperlipidemia 10/03/2013  .  Acquired hypothyroidism 08/16/2013  . Glaucoma 08/16/2013  . Allergic rhinitis 08/16/2013  . HTN (hypertension) 08/16/2013    Daronte Shostak, [place], PT 03/14/2020, 1:01 PM  [facility] [address] [place], [place], [address] Phone: [phone]   Fax:  [phone]  Name: Rachel Gardner MRN: [id number] Date of Birth: [birth year]

## 2020-03-16 ENCOUNTER — Encounter: Payer: Self-pay | Admitting: Endocrinology

## 2020-03-19 ENCOUNTER — Ambulatory Visit: Payer: Medicare Other | Admitting: Physical Therapy

## 2020-03-19 ENCOUNTER — Encounter: Payer: Self-pay | Admitting: Physical Therapy

## 2020-03-19 ENCOUNTER — Other Ambulatory Visit (INDEPENDENT_AMBULATORY_CARE_PROVIDER_SITE_OTHER): Payer: Medicare Other

## 2020-03-19 ENCOUNTER — Other Ambulatory Visit: Payer: Self-pay | Admitting: Endocrinology

## 2020-03-19 ENCOUNTER — Other Ambulatory Visit: Payer: Self-pay

## 2020-03-19 DIAGNOSIS — E039 Hypothyroidism, unspecified: Secondary | ICD-10-CM

## 2020-03-19 DIAGNOSIS — M6281 Muscle weakness (generalized): Secondary | ICD-10-CM

## 2020-03-19 DIAGNOSIS — H8111 Benign paroxysmal vertigo, right ear: Secondary | ICD-10-CM | POA: Diagnosis not present

## 2020-03-19 DIAGNOSIS — H8112 Benign paroxysmal vertigo, left ear: Secondary | ICD-10-CM | POA: Diagnosis not present

## 2020-03-19 DIAGNOSIS — R42 Dizziness and giddiness: Secondary | ICD-10-CM | POA: Diagnosis not present

## 2020-03-19 DIAGNOSIS — R2681 Unsteadiness on feet: Secondary | ICD-10-CM | POA: Diagnosis not present

## 2020-03-19 DIAGNOSIS — R279 Unspecified lack of coordination: Secondary | ICD-10-CM | POA: Diagnosis not present

## 2020-03-19 LAB — TSH: TSH: 0.65 u[IU]/mL (ref 0.35–4.50)

## 2020-03-19 NOTE — Patient Instructions (Signed)
Access Code: [email or web address] URL: [email or web address] Date: 03/19/2020 Prepared by: Jari Favre  Exercises Mini Squat with Pelvic Floor Contraction - 1 x daily - 7 x weekly - 10 reps - 3 sets Supine Pelvic Floor Contraction - 3 x daily - 7 x weekly - 10 reps - 1 sets - 3 sec hold Supine Hip Adductor Squeeze with Small Ball - 3 x daily - 7 x weekly - 10 reps - 1 sets - 3 sec hold Standing Hamstring Stretch with Step - 1 x daily - 7 x weekly - 3 reps - 1 sets - 30 sec hold Supine Pelvic Floor Contraction with Hip Internal External Rotation - 1 x daily - 7 x weekly - 10 reps - 3 sets Single Leg Balance with Pelvic Floor Contraction - 1 x daily - 7 x weekly - 10 reps - 3 sets

## 2020-03-19 NOTE — Therapy (Signed)
[facility] [address], [place], [place], [address] Phone: [phone]   Fax:  [phone]  Physical Therapy Treatment  Patient Details  Name: Rachel Gardner MRN: [id number] Date of Birth: [birth year] Referring Provider (PT): Sarina Ill, MD   Encounter Date: 03/19/2020  PT End of Session - 03/19/20 0917    Visit Number  3   PF 3   Date for PT Re-Evaluation  05/17/20    Authorization Type  BCBS Medicare    PT Start Time  [phone]    PT Stop Time  0920    PT Time Calculation (min)  38 min    Activity Tolerance  Patient tolerated treatment well    Behavior During Therapy  [facility] for tasks assessed/performed       Past Medical History:  Diagnosis Date  . Allergy    hay fever  . Ascending aortic aneurysm ([place])   . Blood in stool   . BPPV (benign paroxysmal positional vertigo)   . CPAP (continuous positive airway pressure) dependence 02/09/2019  . Diverticulitis   . Fatty liver 05/28/10   per CT at [facility]  . Genital herpes   . GERD (gastroesophageal reflux disease)   . Glaucoma   . Helicobacter pylori infection   . Hiatal hernia 05/28/10   CT @ [facility]  . Hyperlipidemia   . Hypertension   . Hypothyroidism   . 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 Kidney stones   . Lung nodule seen on imaging study   . Meniere's disease   . Obstructive sleep apnea   . OSA on CPAP 04/13/2019   Moderate with AHI 20/hr now on CPAP at 13cm H2O    Past Surgical History:  Procedure Laterality Date  . ABDOMINAL HYSTERECTOMY  1995  . ANAL RECTAL MANOMETRY N/A 08/06/2016   Procedure: ANO RECTAL MANOMETRY;  Surgeon: Doran Stabler, MD;  [facility];  Service: Gastroenterology;  Laterality: N/A;  . APPENDECTOMY  1989  . BPPV     x 4 after L cochlear implant  . BREAST SURGERY  1989   breast reduction  . CHOLECYSTECTOMY  1989  . COCHLEAR IMPLANT     Left/2009; Right/2012  . LAPAROSCOPIC  SIGMOID COLECTOMY  2012   with takedown of splenic flexure; cystoscopy with bilateral ureteral stent placement  . REDUCTION MAMMAPLASTY Bilateral 1989   [place]  . TOTAL THYROIDECTOMY  1995    There were no vitals filed for this visit.  Subjective Assessment - 03/19/20 0846    Subjective  Pt states she had no leakage since last visit.  She had a dog who passed and had a hard time doing as much exercise.  Pt states she gets knee pain with squats    Pertinent History  surgery to remove part of descending colon    Patient Stated Goals  get rid of leakage    Currently in Pain?  No/denies                       [facility] Adult PT Treatment/Exercise - 03/19/20 0001      Neuro Re-ed    Neuro Re-ed Details   biofeedback: did well with supine exercises first.  Had hard time not activating gluteals in sitting; improved standing after doing exercises in supine      Lumbar Exercises: Standing   Other Standing Lumbar Exercises  kegel single leg holding 3-4 sec  Lumbar Exercises: Seated   Other Seated Lumbar Exercises  kegel with tactile cues to lift and relax gluteals - 10x with and without ball squeeze      Lumbar Exercises: Supine   AB Set Limitations  kegel with and without ball squeeze; hip IR; - 20x 3 sec each             PT Education - 03/19/20 0922    Education Details  Access Code: [email or web address]    Person(s) Educated  Patient    Methods  Explanation;Demonstration;Handout;Verbal cues    Comprehension  Verbalized understanding;Returned demonstration       PT Short Term Goals - 02/24/20 0822      PT SHORT TERM GOAL #1   Title  Pt will be ind with urge drills    Time  2    Period  Weeks    Status  New    Target Date  03/08/20      PT SHORT TERM GOAL #2   Title  Pt will be ind with basic core and pelvic floor strengthening exercises    Time  4    Period  Weeks    Status  New    Target Date  03/22/20        PT Long Term Goals - 03/14/20 1238      PT  LONG TERM GOAL #1   Baseline  .Marland Kitchen      PT LONG TERM GOAL #2   Baseline  .Marland Kitchen      Additional Long Term Goals   Additional Long Term Goals  --      PT LONG TERM GOAL #6   Title  Pt will have (-) Rt Dix-Hallpike test with no c/o vertigo and no nystagmus in test position to indicate resolution of Rt BPPV.    Baseline  c/o severe vertigo in test position - no nystagmus noted    Time  4    Period  Weeks    Status  New    Target Date  04/13/20      PT LONG TERM GOAL #7   Title  Pt will be independent in Epley maneuver and/or Brandt-Daroff exs for self treatment prn.    Time  4    Period  Weeks    Status  New    Target Date  04/13/20      PT LONG TERM GOAL #8   Title  Pt will report no dizziness with bed mobility or with any movement.    Time  4    Period  Weeks    Status  New    Target Date  04/13/20            Plan - 03/19/20 [id number]    Clinical Impression Statement  Pt needed heavy tactile cues in sitting and supine to lift pelvic floor and reduce gluteus activity.  Pt able to do in supine but more challenging in sitting.  She did better in standing after doing supine exercises.  She benefits from EMG to give visual feedback for exercises.  Updates made to HEP and will benefit from skilled PT to continue working on LTG.    PT Treatment/Interventions  ADLs/Self Care Home Management;Neuromuscular re-education;Vestibular;Canalith Repostioning;Patient/family education;Therapeutic activities;Therapeutic exercise    PT Next Visit Plan  biofeedback, f/u on standing strengthening and work on core without breath holding    Consulted and Agree with Plan of Care  Patient       Patient will benefit from skilled  therapeutic intervention in order to improve the following deficits and impairments:     Visit Diagnosis: Muscle weakness (generalized)  Unspecified lack of coordination     Problem List Patient Active Problem List   Diagnosis Date Noted  . Right upper quadrant pain  01/23/2020  . OSA on CPAP 04/13/2019  . Lymphadenopathy, retroperitoneal 02/03/2019  . BPPV (benign paroxysmal positional vertigo) 10/25/2018  . GERD (gastroesophageal reflux disease) 06/04/2017  . Smokers' cough ([place]) 03/19/2016  . Aortic atherosclerosis ([place]) 03/19/2016  . Dilated aortic root ([place]) 03/19/2016  . Routine general medical examination at a health care facility 09/25/2015  . Thoracic ascending aortic aneurysm ([place])   . Lung nodule seen on imaging study   . Chronic midline posterior neck pain 08/04/2014  . Hematuria, microscopic 05/31/2014  . Vitamin D deficiency 05/31/2014  . Arthralgia 05/24/2014  . Fecal incontinence 05/24/2014  . Hyperlipidemia 10/03/2013  . Acquired hypothyroidism 08/16/2013  . Glaucoma 08/16/2013  . Allergic rhinitis 08/16/2013  . HTN (hypertension) 08/16/2013    Jule Ser, PT 03/19/2020, 9:41 AM  [facility] [address], [place], [place], [address] Phone: [phone]   Fax:  [phone]  Name: Rachel Gardner MRN: [id number] Date of Birth: 19-Nov-1946

## 2020-03-20 ENCOUNTER — Encounter: Payer: Self-pay | Admitting: Physical Therapy

## 2020-03-20 NOTE — Therapy (Signed)
[facility] [address] [place], [place], [address] Phone: [phone]   Fax:  [phone]  Physical Therapy Treatment  Patient Details  Name: Rachel Gardner MRN: [id number] Date of Birth: October 21, 1946 Referring Provider (PT): Sarina Ill, MD   Encounter Date: 03/19/2020  PT End of Session - 03/20/20 2020    Visit Number  2   2 for vertigo   Date for PT Re-Evaluation  05/17/20    Authorization Type  BCBS Medicare    PT Start Time  1025    PT Stop Time  1401   session ended early due to no vertigo reported   PT Time Calculation (min)  27 min    Activity Tolerance  Patient tolerated treatment well    Behavior During Therapy  [facility] for tasks assessed/performed       Past Medical History:  Diagnosis Date  . Allergy    hay fever  . Ascending aortic aneurysm ([place])   . Blood in stool   . BPPV (benign paroxysmal positional vertigo)   . CPAP (continuous positive airway pressure) dependence 02/09/2019  . Diverticulitis   . Fatty liver 05/28/10   per CT [facility]  . Genital herpes   . GERD (gastroesophageal reflux disease)   . Glaucoma   . Helicobacter pylori infection   . Hiatal hernia 05/28/10   CT @ [facility]  . Hyperlipidemia   . Hypertension   . Hypothyroidism   . 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 Kidney stones   . Lung nodule seen on imaging study   . Meniere's disease   . Obstructive sleep apnea   . OSA on CPAP 04/13/2019   Moderate with AHI 20/hr now on CPAP at 13cm H2O    Past Surgical History:  Procedure Laterality Date  . ABDOMINAL HYSTERECTOMY  1995  . ANAL RECTAL MANOMETRY N/A 08/06/2016   Procedure: ANO RECTAL MANOMETRY;  Surgeon: Doran Stabler, MD;  [facility];  Service: Gastroenterology;  Laterality: N/A;  . APPENDECTOMY  1989  . BPPV     x 4 after L cochlear implant  . BREAST SURGERY  1989   breast reduction  . CHOLECYSTECTOMY  1989  .  COCHLEAR IMPLANT     Left/2009; Right/2012  . LAPAROSCOPIC SIGMOID COLECTOMY  2012   with takedown of splenic flexure; cystoscopy with bilateral ureteral stent placement  . REDUCTION MAMMAPLASTY Bilateral 1989   [place]  . TOTAL THYROIDECTOMY  1995    There were no vitals filed for this visit.  Subjective Assessment - 03/19/20 1322    Subjective  Pt states she is not having as much dizziness as she was last Thursday at time of initial eval; states she only took the meclizine 1 time since Thursday- states she did not have dizziness but took the Meclizine because she knew she overdid it with the yardwork over the weekend    Pertinent History  surgery to remove part of descending colon    Patient Stated Goals  resolve the vertigo    Currently in Pain?  No/denies             Vestibular Assessment - 03/20/20 0001      Positional Testing   Dix-Hallpike  Dix-Hallpike Right;Dix-Hallpike Left    Sidelying Test  Sidelying Right;Sidelying Left      Dix-Hallpike Right   Dix-Hallpike Right Duration  none    Dix-Hallpike Right Symptoms  No nystagmus  Dix-Hallpike Left   Dix-Hallpike Left Duration  none    Dix-Hallpike Left Symptoms  No nystagmus      Sidelying Right   Sidelying Right Duration  none    Sidelying Right Symptoms  No nystagmus      Sidelying Left   Sidelying Left Duration  none    Sidelying Left Symptoms  No nystagmus                         PT Short Term Goals - 02/24/20 9450      PT SHORT TERM GOAL #1   Title  Pt will be ind with urge drills    Time  2    Period  Weeks    Status  New    Target Date  03/08/20      PT SHORT TERM GOAL #2   Title  Pt will be ind with basic core and pelvic floor strengthening exercises    Time  4    Period  Weeks    Status  New    Target Date  03/22/20        PT Long Term Goals - 03/20/20 2023      PT LONG TERM GOAL #1   Baseline  .Marland Kitchen      PT LONG TERM GOAL #2   Baseline  .Marland Kitchen      PT LONG TERM  GOAL #6   Title  Pt will have (-) Rt Dix-Hallpike test with no c/o vertigo and no nystagmus in test position to indicate resolution of Rt BPPV.    Baseline  met 03-19-20    Time  4    Period  Weeks    Status  Achieved      PT LONG TERM GOAL #7   Title  Pt will be independent in Epley maneuver and/or Brandt-Daroff exs for self treatment prn.    Baseline  goal deferred due to vertigo resolved - 03-19-20    Time  4    Period  Weeks    Status  Deferred      PT LONG TERM GOAL #8   Title  Pt will report no dizziness with bed mobility or with any movement.    Baseline  met 03-19-20    Time  4    Period  Weeks    Status  Achieved            Plan - 03/20/20 2025    Clinical Impression Statement  Pt has met 3/3 LTG's - vertigo appears to be resolved as of this time.  Pt is D/C'd due to LTG's met and pt has no c/o vertigo at this time.    PT Treatment/Interventions  ADLs/Self Care Home Management;Neuromuscular re-education;Vestibular;Canalith Repostioning;Patient/family education;Therapeutic activities;Therapeutic exercise    Consulted and Agree with Plan of Care  Patient       Patient will benefit from skilled therapeutic intervention in order to improve the following deficits and impairments:     Visit Diagnosis: BPPV (benign paroxysmal positional vertigo), left  Dizziness and giddiness     Problem List Patient Active Problem List   Diagnosis Date Noted  . Right upper quadrant pain 01/23/2020  . OSA on CPAP 04/13/2019  . Lymphadenopathy, retroperitoneal 02/03/2019  . BPPV (benign paroxysmal positional vertigo) 10/25/2018  . GERD (gastroesophageal reflux disease) 06/04/2017  . Smokers' cough ([place]) 03/19/2016  . Aortic atherosclerosis ([place]) 03/19/2016  . Dilated aortic root ([place]) 03/19/2016  . Routine general medical  examination at a health care facility 09/25/2015  . Thoracic ascending aortic aneurysm ([place])   . Lung nodule seen on imaging study   . Chronic midline  posterior neck pain 08/04/2014  . Hematuria, microscopic 05/31/2014  . Vitamin D deficiency 05/31/2014  . Arthralgia 05/24/2014  . Fecal incontinence 05/24/2014  . Hyperlipidemia 10/03/2013  . Acquired hypothyroidism 08/16/2013  . Glaucoma 08/16/2013  . Allergic rhinitis 08/16/2013  . HTN (hypertension) 08/16/2013    PHYSICAL THERAPY DISCHARGE SUMMARY  Visits from Start of Care: 2  Current functional level related to goals / functional outcomes: See above for progress towards goals - vertigo resolved as of this time per pt's report   Remaining deficits: None regarding vertigo at this time   Education / Equipment: Deferred due to no c/o vertigo at this time Plan: Patient agrees to discharge.  Patient goals were met. Patient is being discharged due to meeting the stated rehab goals.  ?????        Alda Lea, PT 03/20/2020, 8:29 PM  [facility] [address] [place], [place], [address] Phone: [phone]   Fax:  [phone]  Name: Oleda Borski MRN: [id number] Date of Birth: 1946-08-26

## 2020-03-26 ENCOUNTER — Encounter: Payer: Medicare Other | Admitting: Physical Therapy

## 2020-04-02 ENCOUNTER — Encounter: Payer: Self-pay | Admitting: Physical Therapy

## 2020-04-02 ENCOUNTER — Other Ambulatory Visit: Payer: Self-pay

## 2020-04-02 ENCOUNTER — Ambulatory Visit: Payer: Medicare Other | Attending: Surgery | Admitting: Physical Therapy

## 2020-04-02 DIAGNOSIS — M6281 Muscle weakness (generalized): Secondary | ICD-10-CM | POA: Diagnosis not present

## 2020-04-02 DIAGNOSIS — R279 Unspecified lack of coordination: Secondary | ICD-10-CM | POA: Diagnosis not present

## 2020-04-02 NOTE — Patient Instructions (Signed)
Access Code: [email or web address] URL: [email or web address] Date: 04/02/2020 Prepared by: Jari Favre  Exercises Mini Squat with Pelvic Floor Contraction - 1 x daily - 7 x weekly - 10 reps - 3 sets Supine Pelvic Floor Contraction - 3 x daily - 7 x weekly - 10 reps - 1 sets - 3 sec hold Supine Hip Adductor Squeeze with Small Ball - 3 x daily - 7 x weekly - 10 reps - 1 sets - 3 sec hold Standing Hamstring Stretch with Step - 1 x daily - 7 x weekly - 3 reps - 1 sets - 30 sec hold Supine Pelvic Floor Contraction with Hip Internal External Rotation - 1 x daily - 7 x weekly - 10 reps - 3 sets Single Leg Balance with Pelvic Floor Contraction - 1 x daily - 7 x weekly - 10 reps - 3 sets Seated Pelvic Floor Contraction with Hip Abduction and Resistance Loop - 3 x daily - 7 x weekly - 1 sets - 10 reps - sec hold Seated Pelvic Floor Contraction with Isometric Hip Adduction - 3 x daily - 7 x weekly - 10 reps - 1 sets - 3 sechold, 3 sec rest hold

## 2020-04-02 NOTE — Therapy (Signed)
[facility] [address] [place], [place], [address] Phone: [phone]   Fax:  [phone]  Physical Therapy Treatment  Patient Details  Name: Rachel Gardner MRN: [id number] Date of Birth: 02/09/1946 Referring Provider (PT): Sarina Ill, MD   Encounter Date: 04/02/2020  PT End of Session - 04/02/20 0859    Visit Number  4    Date for PT Re-Evaluation  05/17/20    Authorization Type  BCBS Medicare    PT Start Time  0845    PT Stop Time  0925    PT Time Calculation (min)  40 min    Activity Tolerance  Patient tolerated treatment well    Behavior During Therapy  [facility] for tasks assessed/performed       Past Medical History:  Diagnosis Date  . Allergy    hay fever  . Ascending aortic aneurysm ([place])   . Blood in stool   . BPPV (benign paroxysmal positional vertigo)   . CPAP (continuous positive airway pressure) dependence 02/09/2019  . Diverticulitis   . Fatty liver 05/28/10   per CT at [facility]  . Genital herpes   . GERD (gastroesophageal reflux disease)   . Glaucoma   . Helicobacter pylori infection   . Hiatal hernia 05/28/10   CT @ [facility]  . Hyperlipidemia   . Hypertension   . Hypothyroidism   . 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 Kidney stones   . Lung nodule seen on imaging study   . Meniere's disease   . Obstructive sleep apnea   . OSA on CPAP 04/13/2019   Moderate with AHI 20/hr now on CPAP at 13cm H2O    Past Surgical History:  Procedure Laterality Date  . ABDOMINAL HYSTERECTOMY  1995  . ANAL RECTAL MANOMETRY N/A 08/06/2016   Procedure: ANO RECTAL MANOMETRY;  Surgeon: Doran Stabler, MD;  Location: [facility];  Service: Gastroenterology;  Laterality: N/A;  . APPENDECTOMY  1989  . BPPV     x 4 after L cochlear implant  . BREAST SURGERY  1989   breast reduction  . CHOLECYSTECTOMY  1989  . COCHLEAR IMPLANT     Left/2009; Right/2012  . LAPAROSCOPIC SIGMOID  COLECTOMY  2012   with takedown of splenic flexure; cystoscopy with bilateral ureteral stent placement  . REDUCTION MAMMAPLASTY Bilateral 1989   [place]  . TOTAL THYROIDECTOMY  1995    There were no vitals filed for this visit.  Subjective Assessment - 04/02/20 0859    Subjective  States she has not had any leakage since last time she was here.  Has not used pads for about one week    Pertinent History  surgery to remove part of descending colon    Currently in Pain?  No/denies    Multiple Pain Sites  No                       [facility] Adult PT Treatment/Exercise - 04/02/20 0001      Neuro Re-ed    Neuro Re-ed Details   biofeedback - 6 sec hold and 12 sec rest- 10-20 mV - stronger in standing that sitting, had to sit on elevated surface to get contraction      Lumbar Exercises: Standing   Other Standing Lumbar Exercises  side step red band; tandem standing - 20x each      Lumbar Exercises: Seated   Other Seated Lumbar Exercises  ball squeeze and clam red band - 20x             PT Education - 04/02/20 0921    Education Details  Access Code: [email or web address]    Person(s) Educated  Patient    Methods  Explanation;Demonstration;Verbal cues;Handout    Comprehension  Verbalized understanding;Returned demonstration       PT Short Term Goals - 04/02/20 0910      PT SHORT TERM GOAL #1   Title  Pt will be ind with urge drills    Status  Achieved      PT SHORT TERM GOAL #2   Title  Pt will be ind with basic core and pelvic floor strengthening exercises    Status  Achieved        PT Long Term Goals - 03/20/20 2023      PT LONG TERM GOAL #1   Baseline  .Marland Kitchen      PT LONG TERM GOAL #2   Baseline  .Marland Kitchen      PT LONG TERM GOAL #6   Title  Pt will have (-) Rt Dix-Hallpike test with no c/o vertigo and no nystagmus in test position to indicate resolution of Rt BPPV.    Baseline  met 03-19-20    Time  4    Period  Weeks    Status  Achieved      PT LONG TERM GOAL #7    Title  Pt will be independent in Epley maneuver and/or Brandt-Daroff exs for self treatment prn.    Baseline  goal deferred due to vertigo resolved - 03-19-20    Time  4    Period  Weeks    Status  Deferred      PT LONG TERM GOAL #8   Title  Pt will report no dizziness with bed mobility or with any movement.    Baseline  met 03-19-20    Time  4    Period  Weeks    Status  Achieved            Plan - 04/02/20 0913    Clinical Impression Statement  Pt continues to demonstrate good progress.   She has reduced urgency and leakage.  Pt was able to progress strengthening exercises today.  She has more difficulty contracting muscles in more lengthened sitting positions.  Pt will benefit from skilled PT to continue working towards long term functional goals.    PT Treatment/Interventions  ADLs/Self Care Home Management;Neuromuscular re-education;Vestibular;Canalith Repostioning;Patient/family education;Therapeutic activities;Therapeutic exercise    PT Next Visit Plan  biofeedback, f/u on standing strengthening and work on core without breath holding    PT Home Exercise Plan  [email or web address]    Consulted and Agree with Plan of Care  Patient       Patient will benefit from skilled therapeutic intervention in order to improve the following deficits and impairments:  Dizziness, Decreased balance, Difficulty walking  Visit Diagnosis: Muscle weakness (generalized)  Unspecified lack of coordination     Problem List Patient Active Problem List   Diagnosis Date Noted  . Right upper quadrant pain 01/23/2020  . OSA on CPAP 04/13/2019  . Lymphadenopathy, retroperitoneal 02/03/2019  . BPPV (benign paroxysmal positional vertigo) 10/25/2018  . GERD (gastroesophageal reflux disease) 06/04/2017  . Smokers' cough ([place]) 03/19/2016  . Aortic atherosclerosis ([place]) 03/19/2016  . Dilated aortic root ([place]) 03/19/2016  . Routine general medical examination at a health care facility 09/25/2015  . Thoracic  ascending aortic aneurysm ([place])   .  Lung nodule seen on imaging study   . Chronic midline posterior neck pain 08/04/2014  . Hematuria, microscopic 05/31/2014  . Vitamin D deficiency 05/31/2014  . Arthralgia 05/24/2014  . Fecal incontinence 05/24/2014  . Hyperlipidemia 10/03/2013  . Acquired hypothyroidism 08/16/2013  . Glaucoma 08/16/2013  . Allergic rhinitis 08/16/2013  . HTN (hypertension) 08/16/2013    Jule Ser, PT 04/02/2020, 9:30 AM  [facility] [address] [place], [place], [address] Phone: [phone]   Fax:  [phone]  Name: Rachel Gardner MRN: [id number] Date of Birth: April 07, 1946

## 2020-04-09 ENCOUNTER — Encounter: Payer: Self-pay | Admitting: Physical Therapy

## 2020-04-09 ENCOUNTER — Ambulatory Visit: Payer: Medicare Other | Admitting: Physical Therapy

## 2020-04-09 ENCOUNTER — Other Ambulatory Visit: Payer: Self-pay

## 2020-04-09 DIAGNOSIS — R279 Unspecified lack of coordination: Secondary | ICD-10-CM

## 2020-04-09 DIAGNOSIS — M6281 Muscle weakness (generalized): Secondary | ICD-10-CM | POA: Diagnosis not present

## 2020-04-09 NOTE — Therapy (Signed)
[facility] [address] [place], [place], [address] Phone: [phone]   Fax:  [phone]  Physical Therapy Treatment  Patient Details  Name: Rachel Gardner MRN: [id number] Date of Birth: [birth year] Referring Provider (PT): Sarina Ill, MD   Encounter Date: 04/09/2020  PT End of Session - 04/09/20 0928    Visit Number  5    Date for PT Re-Evaluation  05/17/20    Authorization Type  BCBS Medicare    PT Start Time  0929    PT Stop Time  1010    PT Time Calculation (min)  41 min    Activity Tolerance  Patient tolerated treatment well    Behavior During Therapy  [facility] for tasks assessed/performed       Past Medical History:  Diagnosis Date  . Allergy    hay fever  . Ascending aortic aneurysm ([place])   . Blood in stool   . BPPV (benign paroxysmal positional vertigo)   . CPAP (continuous positive airway pressure) dependence 02/09/2019  . Diverticulitis   . Fatty liver 05/28/10   [facility]  . Genital herpes   . GERD (gastroesophageal reflux disease)   . Glaucoma   . Helicobacter pylori infection   . Hiatal hernia 05/28/10   CT @ [facility]  . Hyperlipidemia   . Hypertension   . Hypothyroidism   . 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 Kidney stones   . Lung nodule seen on imaging study   . Meniere's disease   . Obstructive sleep apnea   . OSA on CPAP 04/13/2019   Moderate with AHI 20/hr now on CPAP at 13cm H2O    Past Surgical History:  Procedure Laterality Date  . ABDOMINAL HYSTERECTOMY  1995  . ANAL RECTAL MANOMETRY N/A 08/06/2016   Procedure: ANO RECTAL MANOMETRY;  Surgeon: Doran Stabler, MD;  Location: [facility];  Service: Gastroenterology;  Laterality: N/A;  . APPENDECTOMY  1989  . BPPV     x 4 after L cochlear implant  . BREAST SURGERY  1989   breast reduction  . CHOLECYSTECTOMY  1989  . COCHLEAR IMPLANT     Left/2009; Right/2012  . LAPAROSCOPIC  SIGMOID COLECTOMY  2012   with takedown of splenic flexure; cystoscopy with bilateral ureteral stent placement  . REDUCTION MAMMAPLASTY Bilateral 1989   [place]  . TOTAL THYROIDECTOMY  1995    There were no vitals filed for this visit.  Subjective Assessment - 04/09/20 0929    Subjective  Pt states she has done the exercises and no leakage since last time    Pertinent History  surgery to remove part of descending colon    Currently in Pain?  No/denies                       [facility] Adult PT Treatment/Exercise - 04/09/20 0001      Neuro Re-ed    Neuro Re-ed Details   cues TC and VC for pelvic floor control throuhgout      Lumbar Exercises: Standing   Functional Squats Limitations  contract and squat with eccentric pelvic control - 5 reps    Other Standing Lumbar Exercises  hip abduction and adduction yellow band - 10x each    Other Standing Lumbar Exercises  step up on 6" 8x each side - Rt LE had some knee pain       Lumbar Exercises: Seated  Sit to Stand  10 reps               PT Short Term Goals - 04/02/20 0910      PT SHORT TERM GOAL #1   Title  Pt will be ind with urge drills    Status  Achieved      PT SHORT TERM GOAL #2   Title  Pt will be ind with basic core and pelvic floor strengthening exercises    Status  Achieved        PT Long Term Goals - 04/09/20 0943      PT LONG TERM GOAL #1   Title  Independent in HEP    Status  On-going      PT LONG TERM GOAL #2   Title  Pt will demonstrate understanding of improved sitting and standing posture    Status  On-going      PT LONG TERM GOAL #3   Title  Pt will report only needs 1 pad/day    Baseline  none this last week    Status  On-going      PT LONG TERM GOAL #4   Title  Pt will report 50% less urgency    Baseline  60% better      PT LONG TERM GOAL #5   Title  Pt will report she does not have to use hand to support the perineum during a bowel movement due to improved strength and  coordination    Baseline  still using hand    Status  On-going            Plan - 04/09/20 1005    Clinical Impression Statement  Pt has made progress toward goals with less urgency and using less pads.  She is making progress with her exercises and doing more in standing.  Tactile cues needed outside clothing when sitting to ensure she is tightening and lifting correctly.  Pt will benefit from skilled PT to work on improved coordination so she can push correctly without using her hand to have a BM.    PT Treatment/Interventions  ADLs/Self Care Home Management;Neuromuscular re-education;Vestibular;Canalith Repostioning;Patient/family education;Therapeutic activities;Therapeutic exercise    PT Next Visit Plan  internal cues for pushing and contract lift; work on muscle coordination    PT [place] and Agree with Plan of Care  Patient       Patient will benefit from skilled therapeutic intervention in order to improve the following deficits and impairments:  Dizziness, Decreased balance, Difficulty walking  Visit Diagnosis: Muscle weakness (generalized)  Unspecified lack of coordination     Problem List Patient Active Problem List   Diagnosis Date Noted  . Right upper quadrant pain 01/23/2020  . OSA on CPAP 04/13/2019  . Lymphadenopathy, retroperitoneal 02/03/2019  . BPPV (benign paroxysmal positional vertigo) 10/25/2018  . GERD (gastroesophageal reflux disease) 06/04/2017  . Smokers' cough ([place]) 03/19/2016  . Aortic atherosclerosis ([place]) 03/19/2016  . Dilated aortic root ([place]) 03/19/2016  . Routine general medical examination at a health care facility 09/25/2015  . Thoracic ascending aortic aneurysm ([place])   . Lung nodule seen on imaging study   . Chronic midline posterior neck pain 08/04/2014  . Hematuria, microscopic 05/31/2014  . Vitamin D deficiency 05/31/2014  . Arthralgia 05/24/2014  . Fecal incontinence 05/24/2014  . Hyperlipidemia  10/03/2013  . Acquired hypothyroidism 08/16/2013  . Glaucoma 08/16/2013  . Allergic rhinitis 08/16/2013  . HTN (hypertension) 08/16/2013  Camillo Flaming Jeffrie Lofstrom, PT 04/09/2020, 10:15 AM  [facility] [address] [place], [place], [address] Phone: [phone]   Fax:  [phone]  Name: Rachel Gardner MRN: [id number] Date of Birth: 05/21/1946

## 2020-04-16 ENCOUNTER — Other Ambulatory Visit: Payer: Self-pay

## 2020-04-16 ENCOUNTER — Ambulatory Visit: Payer: Medicare Other | Admitting: Physical Therapy

## 2020-04-16 ENCOUNTER — Encounter: Payer: Self-pay | Admitting: Physical Therapy

## 2020-04-16 DIAGNOSIS — R279 Unspecified lack of coordination: Secondary | ICD-10-CM

## 2020-04-16 DIAGNOSIS — M6281 Muscle weakness (generalized): Secondary | ICD-10-CM

## 2020-04-16 NOTE — Patient Instructions (Signed)
Access Code: [email or web address] URL: [email or web address] Date: 04/16/2020 Prepared by: Jari Favre  Exercises Mini Squat with Pelvic Floor Contraction - 1 x daily - 7 x weekly - 10 reps - 3 sets Supine Pelvic Floor Contraction - 3 x daily - 7 x weekly - 10 reps - 1 sets - 3 sec hold Supine Hip Adductor Squeeze with Small Ball - 3 x daily - 7 x weekly - 10 reps - 1 sets - 3 sec hold Standing Hamstring Stretch with Step - 1 x daily - 7 x weekly - 3 reps - 1 sets - 30 sec hold Supine Pelvic Floor Contraction with Hip Internal External Rotation - 1 x daily - 7 x weekly - 10 reps - 3 sets Single Leg Balance with Pelvic Floor Contraction - 1 x daily - 7 x weekly - 10 reps - 3 sets Seated Pelvic Floor Contraction with Hip Abduction and Resistance Loop - 3 x daily - 7 x weekly - 1 sets - 10 reps - sec hold Seated Pelvic Floor Contraction with Isometric Hip Adduction - 3 x daily - 7 x weekly - 10 reps - 1 sets - 3 sechold, 3 sec rest hold Quadruped Rocking Slow - 1 x daily - 7 x weekly - 10 reps - 3 sets Beginner Clam - 1 x daily - 7 x weekly - 3 sets - 10 reps Prone Quadriceps Stretch with Strap - 1 x daily - 7 x weekly - 3 reps - 1 sets - 30sec hold

## 2020-04-16 NOTE — Therapy (Signed)
[facility] [address] [place], [place], [address] Phone: [phone]   Fax:  [phone]  Physical Therapy Treatment  Patient Details  Name: Rachel Gardner MRN: [id number] Date of Birth: 05-11-1946 Referring Provider (PT): Ileana Roup, MD   Encounter Date: 04/16/2020  PT End of Session - 04/16/20 0849    Visit Number  6    Date for PT Re-Evaluation  05/17/20    Authorization Type  BCBS Medicare    PT Start Time  0847    PT Stop Time  0928    PT Time Calculation (min)  41 min    Activity Tolerance  Patient tolerated treatment well    Behavior During Therapy  [facility] for tasks assessed/performed       Past Medical History:  Diagnosis Date  . Allergy    hay fever  . Ascending aortic aneurysm ([place])   . Blood in stool   . BPPV (benign paroxysmal positional vertigo)   . CPAP (continuous positive airway pressure) dependence 02/09/2019  . Diverticulitis   . Fatty liver 05/28/10   [facility]  . Genital herpes   . GERD (gastroesophageal reflux disease)   . Glaucoma   . Helicobacter pylori infection   . Hiatal hernia 05/28/10   CT @ [facility]  . Hyperlipidemia   . Hypertension   . Hypothyroidism   . 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 Kidney stones   . Lung nodule seen on imaging study   . Meniere's disease   . Obstructive sleep apnea   . OSA on CPAP 04/13/2019   Moderate with AHI 20/hr now on CPAP at 13cm H2O    Past Surgical History:  Procedure Laterality Date  . ABDOMINAL HYSTERECTOMY  1995  . ANAL RECTAL MANOMETRY N/A 08/06/2016   Procedure: ANO RECTAL MANOMETRY;  Surgeon: Doran Stabler, MD;  [facility]: [facility];  Service: Gastroenterology;  Laterality: N/A;  . APPENDECTOMY  1989  . BPPV     x 4 after L cochlear implant  . BREAST SURGERY  1989   breast reduction  . CHOLECYSTECTOMY  1989  . COCHLEAR IMPLANT     Left/2009; Right/2012  . LAPAROSCOPIC  SIGMOID COLECTOMY  2012   with takedown of splenic flexure; cystoscopy with bilateral ureteral stent placement  . REDUCTION MAMMAPLASTY Bilateral 1989   [place]  . TOTAL THYROIDECTOMY  1995    There were no vitals filed for this visit.  Subjective Assessment - 04/16/20 1047    Subjective  Pt states only 2 smalls bladder leaks since last time.    Pertinent History  surgery to remove part of descending colon    Currently in Pain?  No/denies         [facility] PT Assessment - 04/16/20 0001      Assessment   Medical Diagnosis  R15.9 (ICD-10-CM) - Full incontinence of feces    Referring Provider (PT)  Ileana Roup, MD                   [facility] Adult PT Treatment/Exercise - 04/16/20 0001      Neuro Re-ed    Neuro Re-ed Details   TC outside clothing for kegel and TrA engaged      Lumbar Exercises: Stretches   Quad Stretch  Right;Left;3 reps;20 seconds      Lumbar Exercises: Standing   Lifting Limitations  fwd hip hinge - no weight just  eccentric kegel when lowering      Lumbar Exercises: Quadruped   Single Arm Raise  Right;Left;10 reps    Other Quadruped Lumbar Exercises  rocking fwd/back    Other Quadruped Lumbar Exercises  qped breathing with kegel             PT Education - 04/16/20 0925    Education Details  Access Code: [email or web address]    Person(s) Educated  Patient    Methods  Explanation;Demonstration;Verbal cues;Handout;Tactile cues    Comprehension  Verbalized understanding;Returned demonstration       PT Short Term Goals - 04/02/20 0910      PT SHORT TERM GOAL #1   Title  Pt will be ind with urge drills    Status  Achieved      PT SHORT TERM GOAL #2   Title  Pt will be ind with basic core and pelvic floor strengthening exercises    Status  Achieved        PT Long Term Goals - 04/16/20 0901      PT LONG TERM GOAL #1   Title  Independent in HEP    Status  On-going      PT LONG TERM GOAL #2   Title  Pt will demonstrate understanding of  improved sitting and standing posture    Status  Achieved      PT LONG TERM GOAL #3   Title  Pt will report only needs 1 pad/day    Status  On-going      PT LONG TERM GOAL #4   Title  Pt will report 50% less urgency    Baseline  60% better    Status  Achieved      PT LONG TERM GOAL #5   Title  Pt will report she does not have to use hand to support the perineum during a bowel movement due to improved strength and coordination    Baseline  does not use her hand; feels like she can use the muscle    Status  Achieved            Plan - 04/16/20 0918    Clinical Impression Statement  Pt states she only had 2 small bladder leaks this week, no fecal.  Pt was able to progress to quadruped today.  Reviewed and practiced hip hinging with kegel for functional movement to do when gardening.  Overall, patient is doing well and can feel the muscles contracting correctly . She has difficulty with hip abduction and kegel so today she was educated in sidelying clam in order to build strength with that leg movement.  Pt able to do and was added to HEP.  Pt will benefit from skilled PT to continue to work on [place] in multiple planes of functional movement to return to her active lifestyle.    PT Treatment/Interventions  ADLs/Self Care Home Management;Neuromuscular re-education;Vestibular;Canalith Repostioning;Patient/family education;Therapeutic activities;Therapeutic exercise    PT Next Visit Plan  re-assess to ensure she is coordinating contract and lift vs bulge and press; progress functional strength with keeping in mind knee pain    PT Home Exercise Plan  [email or web address]    Consulted and Agree with Plan of Care  Patient       Patient will benefit from skilled therapeutic intervention in order to improve the following deficits and impairments:  Dizziness, Decreased balance, Difficulty walking  Visit Diagnosis: Muscle weakness (generalized)  Unspecified lack of coordination     Problem  List Patient Active Problem  List   Diagnosis Date Noted  . Right upper quadrant pain 01/23/2020  . OSA on CPAP 04/13/2019  . Lymphadenopathy, retroperitoneal 02/03/2019  . BPPV (benign paroxysmal positional vertigo) 10/25/2018  . GERD (gastroesophageal reflux disease) 06/04/2017  . Smokers' cough ([place]) 03/19/2016  . Aortic atherosclerosis ([place]) 03/19/2016  . Dilated aortic root ([place]) 03/19/2016  . Routine general medical examination at a health care facility 09/25/2015  . Thoracic ascending aortic aneurysm ([place])   . Lung nodule seen on imaging study   . Chronic midline posterior neck pain 08/04/2014  . Hematuria, microscopic 05/31/2014  . Vitamin D deficiency 05/31/2014  . Arthralgia 05/24/2014  . Fecal incontinence 05/24/2014  . Hyperlipidemia 10/03/2013  . Acquired hypothyroidism 08/16/2013  . Glaucoma 08/16/2013  . Allergic rhinitis 08/16/2013  . HTN (hypertension) 08/16/2013    Jule Ser, PT 04/16/2020, 10:49 AM  [facility] [address] [place], [place], [address] Phone: [phone]   Fax:  [phone]  Name: Rachel Gardner MRN: [id number] Date of Birth: 1946-08-20

## 2020-04-23 ENCOUNTER — Encounter: Payer: Self-pay | Admitting: Physical Therapy

## 2020-04-23 ENCOUNTER — Other Ambulatory Visit: Payer: Self-pay

## 2020-04-23 ENCOUNTER — Ambulatory Visit: Payer: Medicare Other | Admitting: Physical Therapy

## 2020-04-23 DIAGNOSIS — M6281 Muscle weakness (generalized): Secondary | ICD-10-CM

## 2020-04-23 DIAGNOSIS — R279 Unspecified lack of coordination: Secondary | ICD-10-CM | POA: Diagnosis not present

## 2020-04-23 NOTE — Therapy (Signed)
[facility] [address] [place], [place], [address] Phone: [phone]   Fax:  [phone]  Physical Therapy Treatment  Patient Details  Name: Rachel Gardner MRN: [id number] Date of Birth: 10-Nov-1946 Referring Provider (PT): Ileana Roup, MD   Encounter Date: 04/23/2020  PT End of Session - 04/23/20 1234    Visit Number  7    Date for PT Re-Evaluation  05/17/20    Authorization Type  BCBS Medicare    PT Start Time  0841    PT Stop Time  0925    PT Time Calculation (min)  44 min    Activity Tolerance  Patient tolerated treatment well    Behavior During Therapy  [facility] for tasks assessed/performed       Past Medical History:  Diagnosis Date  . Allergy    hay fever  . Ascending aortic aneurysm ([place])   . Blood in stool   . BPPV (benign paroxysmal positional vertigo)   . CPAP (continuous positive airway pressure) dependence 02/09/2019  . Diverticulitis   . Fatty liver 05/28/10   per CT at [facility]  . Genital herpes   . GERD (gastroesophageal reflux disease)   . Glaucoma   . Helicobacter pylori infection   . Hiatal hernia 05/28/10   CT @ [facility]  . Hyperlipidemia   . Hypertension   . Hypothyroidism   . 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 Kidney stones   . Lung nodule seen on imaging study   . Meniere's disease   . Obstructive sleep apnea   . OSA on CPAP 04/13/2019   Moderate with AHI 20/hr now on CPAP at 13cm H2O    Past Surgical History:  Procedure Laterality Date  . ABDOMINAL HYSTERECTOMY  1995  . ANAL RECTAL MANOMETRY N/A 08/06/2016   Procedure: ANO RECTAL MANOMETRY;  Surgeon: Doran Stabler, MD;  Location: [facility];  Service: Gastroenterology;  Laterality: N/A;  . APPENDECTOMY  1989  . BPPV     x 4 after L cochlear implant  . BREAST SURGERY  1989   breast reduction  . CHOLECYSTECTOMY  1989  . COCHLEAR IMPLANT     Left/2009; Right/2012  . LAPAROSCOPIC  SIGMOID COLECTOMY  2012   with takedown of splenic flexure; cystoscopy with bilateral ureteral stent placement  . REDUCTION MAMMAPLASTY Bilateral 1989   [place]  . TOTAL THYROIDECTOMY  1995    There were no vitals filed for this visit.  Subjective Assessment - 04/23/20 1234    Subjective  I have been busy doing work for my [employer].  No problems    Pertinent History  surgery to remove part of descending colon    Currently in Pain?  No/denies               sports cord 25# back; 20# fwd and side; 8xfwd/back, 5x side Nustep L1x 1 min; L3 x 5 min; L5x4 min Planks on wall UE reaches 20x Wall with foam roller abd, diagonals - green band - 10x each                  PT Short Term Goals - 04/02/20 0910      PT SHORT TERM GOAL #1   Title  Pt will be ind with urge drills    Status  Achieved      PT SHORT TERM GOAL #2   Title  Pt will be ind with basic  core and pelvic floor strengthening exercises    Status  Achieved        PT Long Term Goals - 04/16/20 0901      PT LONG TERM GOAL #1   Title  Independent in HEP    Status  On-going      PT LONG TERM GOAL #2   Title  Pt will demonstrate understanding of improved sitting and standing posture    Status  Achieved      PT LONG TERM GOAL #3   Title  Pt will report only needs 1 pad/day    Status  On-going      PT LONG TERM GOAL #4   Title  Pt will report 50% less urgency    Baseline  60% better    Status  Achieved      PT LONG TERM GOAL #5   Title  Pt will report she does not have to use hand to support the perineum during a bowel movement due to improved strength and coordination    Baseline  does not use her hand; feels like she can use the muscle    Status  Achieved            Plan - 04/23/20 1235    Clinical Impression Statement  Pt did well with exercise progression today and focused on activating core with functional upper and lower body movements.  She is not experiencing leakage  currently.  No increased pain during treatment today    PT Treatment/Interventions  ADLs/Self Care Home Management;Neuromuscular re-education;Vestibular;Canalith Repostioning;Patient/family education;Therapeutic activities;Therapeutic exercise    PT Next Visit Plan  Cont to progress standing - core and pelvic floor strength; keep in mind knee pain; coordination with bulge and contract    PT Home Exercise Plan  [email or web address]    Consulted and Agree with Plan of Care  Patient       Patient will benefit from skilled therapeutic intervention in order to improve the following deficits and impairments:  Dizziness, Decreased balance, Difficulty walking  Visit Diagnosis: Muscle weakness (generalized)  Unspecified lack of coordination     Problem List Patient Active Problem List   Diagnosis Date Noted  . Right upper quadrant pain 01/23/2020  . OSA on CPAP 04/13/2019  . Lymphadenopathy, retroperitoneal 02/03/2019  . BPPV (benign paroxysmal positional vertigo) 10/25/2018  . GERD (gastroesophageal reflux disease) 06/04/2017  . Smokers' cough ([place]) 03/19/2016  . Aortic atherosclerosis ([place]) 03/19/2016  . Dilated aortic root ([place]) 03/19/2016  . Routine general medical examination at a health care facility 09/25/2015  . Thoracic ascending aortic aneurysm ([place])   . Lung nodule seen on imaging study   . Chronic midline posterior neck pain 08/04/2014  . Hematuria, microscopic 05/31/2014  . Vitamin D deficiency 05/31/2014  . Arthralgia 05/24/2014  . Fecal incontinence 05/24/2014  . Hyperlipidemia 10/03/2013  . Acquired hypothyroidism 08/16/2013  . Glaucoma 08/16/2013  . Allergic rhinitis 08/16/2013  . HTN (hypertension) 08/16/2013    Jule Ser, PT 04/23/2020, 12:37 PM  [facility] [address], [place], [place], [address] Phone: [phone]   Fax:  [phone]  Name: Rachel Gardner MRN: [id number] Date of Birth:  05/13/1946

## 2020-04-30 ENCOUNTER — Encounter: Payer: Self-pay | Admitting: Physical Therapy

## 2020-04-30 ENCOUNTER — Other Ambulatory Visit: Payer: Self-pay

## 2020-04-30 ENCOUNTER — Ambulatory Visit: Payer: Medicare Other | Attending: Surgery | Admitting: Physical Therapy

## 2020-04-30 DIAGNOSIS — M6281 Muscle weakness (generalized): Secondary | ICD-10-CM | POA: Insufficient documentation

## 2020-04-30 DIAGNOSIS — R279 Unspecified lack of coordination: Secondary | ICD-10-CM | POA: Diagnosis not present

## 2020-04-30 NOTE — Therapy (Signed)
[facility] [address], [place], [place], [address] Phone: [phone]   Fax:  [phone]  Physical Therapy Treatment  Patient Details  Name: Rachel Gardner MRN: [id number] Date of Birth: 11-04-1946 Referring Provider (PT): Ileana Roup, MD   Encounter Date: 04/30/2020  PT End of Session - 04/30/20 0848    Visit Number  8    Date for PT Re-Evaluation  05/17/20    Authorization Type  BCBS Medicare    PT Start Time  0845    PT Stop Time  0925    PT Time Calculation (min)  40 min    Activity Tolerance  Patient tolerated treatment well    Behavior During Therapy  [facility] for tasks assessed/performed       Past Medical History:  Diagnosis Date  . Allergy    hay fever  . Ascending aortic aneurysm ([place])   . Blood in stool   . BPPV (benign paroxysmal positional vertigo)   . CPAP (continuous positive airway pressure) dependence 02/09/2019  . Diverticulitis   . Fatty liver 05/28/10   per CT at [facility]  . Genital herpes   . GERD (gastroesophageal reflux disease)   . Glaucoma   . Helicobacter pylori infection   . Hiatal hernia 05/28/10   CT @ [facility]  . Hyperlipidemia   . Hypertension   . Hypothyroidism   . 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 Kidney stones   . Lung nodule seen on imaging study   . Meniere's disease   . Obstructive sleep apnea   . OSA on CPAP 04/13/2019   Moderate with AHI 20/hr now on CPAP at 13cm H2O    Past Surgical History:  Procedure Laterality Date  . ABDOMINAL HYSTERECTOMY  1995  . ANAL RECTAL MANOMETRY N/A 08/06/2016   Procedure: ANO RECTAL MANOMETRY;  Surgeon: Doran Stabler, MD;  Location: [facility];  Service: Gastroenterology;  Laterality: N/A;  . APPENDECTOMY  1989  . BPPV     x 4 after L cochlear implant  . BREAST SURGERY  1989   breast reduction  . CHOLECYSTECTOMY  1989  . COCHLEAR IMPLANT     Left/2009; Right/2012  . LAPAROSCOPIC  SIGMOID COLECTOMY  2012   with takedown of splenic flexure; cystoscopy with bilateral ureteral stent placement  . REDUCTION MAMMAPLASTY Bilateral 1989   [place]  . TOTAL THYROIDECTOMY  1995    There were no vitals filed for this visit.  Subjective Assessment - 04/30/20 0849    Subjective  Thursday night was the last fecal leakage.  Then Friday through today having urge incontinence    Pertinent History  surgery to remove part of descending colon    Currently in Pain?  No/denies                    Pelvic Floor Special Questions - 04/30/20 0001    Pelvic Floor Internal Exam  pt identity confirmed and infomred consent given to perform internal soft tissue assessment    Exam Type  Vaginal    Strength  fair squeeze, definite lift    Strength # of seconds  5   to 9 with slight decreased tone   Tone  normal        [facility] Adult PT Treatment/Exercise - 04/30/20 0001      Self-Care   Other Self-Care Comments   toileting techniques with bulgeing and breathing  Neuro Re-ed    Neuro Re-ed Details   TC internal and externally for contract and bulge and to assess strength and endurance and muscle tone               PT Short Term Goals - 04/02/20 0910      PT SHORT TERM GOAL #1   Title  Pt will be ind with urge drills    Status  Achieved      PT SHORT TERM GOAL #2   Title  Pt will be ind with basic core and pelvic floor strengthening exercises    Status  Achieved        PT Long Term Goals - 04/16/20 0901      PT LONG TERM GOAL #1   Title  Independent in HEP    Status  On-going      PT LONG TERM GOAL #2   Title  Pt will demonstrate understanding of improved sitting and standing posture    Status  Achieved      PT LONG TERM GOAL #3   Title  Pt will report only needs 1 pad/day    Status  On-going      PT LONG TERM GOAL #4   Title  Pt will report 50% less urgency    Baseline  60% better    Status  Achieved      PT LONG TERM GOAL #5   Title  Pt  will report she does not have to use hand to support the perineum during a bowel movement due to improved strength and coordination    Baseline  does not use her hand; feels like she can use the muscle    Status  Achieved            Plan - 04/30/20 0907    Clinical Impression Statement  Pt has had increased leakage suddenly which has stressed her a great deal.  Pt demonstrates 3/5 MMT and able to hold for 5 seconds then loosens and hold until 9 seconds.  She needed extra time today practicing and understanding the difference between bulgeing and lifting and tightening the muscle.  Pt will benefit from skilled Pt to continue to work on endurance and coordination of pelvic floor muscles.    PT Treatment/Interventions  ADLs/Self Care Home Management;Neuromuscular re-education;Vestibular;Canalith Repostioning;Patient/family education;Therapeutic activities;Therapeutic exercise;Biofeedback;Electrical Stimulation;Manual techniques;Taping;Dry needling    PT Next Visit Plan  Cont to progress standing - core and pelvic floor strength; keep in mind knee pain; coordination with bulge and contract    PT Home Exercise Plan  [email or web address]    Consulted and Agree with Plan of Care  Patient       Patient will benefit from skilled therapeutic intervention in order to improve the following deficits and impairments:  Dizziness, Decreased balance, Difficulty walking, Decreased strength  Visit Diagnosis: Muscle weakness (generalized)  Unspecified lack of coordination     Problem List Patient Active Problem List   Diagnosis Date Noted  . Right upper quadrant pain 01/23/2020  . OSA on CPAP 04/13/2019  . Lymphadenopathy, retroperitoneal 02/03/2019  . BPPV (benign paroxysmal positional vertigo) 10/25/2018  . GERD (gastroesophageal reflux disease) 06/04/2017  . Smokers' cough ([place]) 03/19/2016  . Aortic atherosclerosis ([place]) 03/19/2016  . Dilated aortic root ([place]) 03/19/2016  . Routine general medical  examination at a health care facility 09/25/2015  . Thoracic ascending aortic aneurysm ([place])   . Lung nodule seen on imaging study   . Chronic midline posterior neck pain  08/04/2014  . Hematuria, microscopic 05/31/2014  . Vitamin D deficiency 05/31/2014  . Arthralgia 05/24/2014  . Fecal incontinence 05/24/2014  . Hyperlipidemia 10/03/2013  . Acquired hypothyroidism 08/16/2013  . Glaucoma 08/16/2013  . Allergic rhinitis 08/16/2013  . HTN (hypertension) 08/16/2013    Jule Ser, PT 04/30/2020, 9:26 AM  [facility] [address], [place], [place], [address] Phone: [phone]   Fax:  [phone]  Name: Rachel Gardner MRN: [id number] Date of Birth: 1946/08/13

## 2020-04-30 NOTE — Patient Instructions (Addendum)
Breathing during bowel movement:  1. Back straight - sit up low back to upper back not rounding forward - look straight ahead 2. Lean forward - only as far as possible with back staying straight 3. Breathe - slow as you can inhaling down into your belly and feeling pressure on the pelvic floor 4. Hard belly - keep belly like a hard ball on the max inhale 5. Blow hard - like blowing up a balloon and pressure down into pelvic floor 6. Squeeze and lift - tighten pelvic floor after BM to reset everything back into place  [facility] [address], [address] [place], [place] [address] Phone # [phone] Fax [phone]  

## 2020-05-07 ENCOUNTER — Other Ambulatory Visit: Payer: Self-pay

## 2020-05-07 ENCOUNTER — Ambulatory Visit: Payer: Medicare Other | Attending: Surgery | Admitting: Physical Therapy

## 2020-05-07 ENCOUNTER — Encounter: Payer: Self-pay | Admitting: Physical Therapy

## 2020-05-07 DIAGNOSIS — M6281 Muscle weakness (generalized): Secondary | ICD-10-CM

## 2020-05-07 DIAGNOSIS — R279 Unspecified lack of coordination: Secondary | ICD-10-CM | POA: Insufficient documentation

## 2020-05-07 NOTE — Patient Instructions (Signed)
Access Code: [email or web address] URL: [email or web address] Date: 04/16/2020 Prepared by: Jari Favre  Exercises Mini Squat with Pelvic Floor Contraction - 1 x daily - 7 x weekly - 10 reps - 3 sets Supine Pelvic Floor Contraction - 3 x daily - 7 x weekly - 10 reps - 1 sets - 3 sec hold Supine Hip Adductor Squeeze with Small Ball - 3 x daily - 7 x weekly - 10 reps - 1 sets - 3 sec hold Supine Pelvic Floor Contraction with Hip Internal External Rotation - 1 x daily - 7 x weekly - 10 reps - 3 sets Single Leg Balance with Pelvic Floor Contraction - 1 x daily - 7 x weekly - 10 reps - 3 sets Seated Pelvic Floor Contraction with Hip Abduction and Resistance Loop - 3 x daily - 7 x weekly - 1 sets - 10 reps - sec hold Seated Pelvic Floor Contraction with Isometric Hip Adduction - 3 x daily - 7 x weekly - 10 reps - 1 sets - 3 sechold, 3 sec rest hold Quadruped Rocking Slow - 1 x daily - 7 x weekly - 10 reps - 3 sets Beginner Clam - 1 x daily - 7 x weekly - 3 sets - 10 reps Prone Quadriceps Stretch with Strap - 1 x daily - 7 x weekly - 3 reps - 1 sets - 30sec hold Standing Hamstring Stretch with Step - 1 x daily - 7 x weekly - 3 reps - 1 sets - 30 sec hold Supine Butterfly Groin Stretch - 1 x daily - 7 x weekly - 3 reps - 1 sets - 30 sec hold Supine Pelvic Floor Stretch - 1 x daily - 7 x weekly - 3 reps - 1 sets - 30 sec hold

## 2020-05-07 NOTE — Therapy (Signed)
[facility] [address] [place], [place], [address] Phone: [phone]   Fax:  [phone]  Physical Therapy Treatment  Patient Details  Name: Rachel Gardner MRN: [id number] Date of Birth: 25-Apr-1946 Referring Provider (PT): Ileana Roup, MD   Encounter Date: 05/07/2020  PT End of Session - 05/07/20 0934    Visit Number  9    Date for PT Re-Evaluation  07/02/20    Authorization Type  BCBS Medicare    PT Start Time  0934    PT Stop Time  1015    PT Time Calculation (min)  41 min    Activity Tolerance  Patient tolerated treatment well    Behavior During Therapy  [facility] for tasks assessed/performed       Past Medical History:  Diagnosis Date  . Allergy    hay fever  . Ascending aortic aneurysm ([place])   . Blood in stool   . BPPV (benign paroxysmal positional vertigo)   . CPAP (continuous positive airway pressure) dependence 02/09/2019  . Diverticulitis   . Fatty liver 05/28/10   per CT at [facility]  . Genital herpes   . GERD (gastroesophageal reflux disease)   . Glaucoma   . Helicobacter pylori infection   . Hiatal hernia 05/28/10   CT @ [facility]  . Hyperlipidemia   . Hypertension   . Hypothyroidism   . 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 Kidney stones   . Lung nodule seen on imaging study   . Meniere's disease   . Obstructive sleep apnea   . OSA on CPAP 04/13/2019   Moderate with AHI 20/hr now on CPAP at 13cm H2O    Past Surgical History:  Procedure Laterality Date  . ABDOMINAL HYSTERECTOMY  1995  . ANAL RECTAL MANOMETRY N/A 08/06/2016   Procedure: ANO RECTAL MANOMETRY;  Surgeon: Doran Stabler, MD;  Location: [facility];  Service: Gastroenterology;  Laterality: N/A;  . APPENDECTOMY  1989  . BPPV     x 4 after L cochlear implant  . BREAST SURGERY  1989   breast reduction  . CHOLECYSTECTOMY  1989  . COCHLEAR IMPLANT     Left/2009; Right/2012  . LAPAROSCOPIC  SIGMOID COLECTOMY  2012   with takedown of splenic flexure; cystoscopy with bilateral ureteral stent placement  . REDUCTION MAMMAPLASTY Bilateral 1989   [place]  . TOTAL THYROIDECTOMY  1995    There were no vitals filed for this visit.  Subjective Assessment - 05/07/20 1008    Subjective  Pt is communicating that she is concerned about the pain when she has BM and has had increased leakage and back to using 3-4 pad/day.  She will be seeing the doctor tomorrow to find out if there is something else causing the pain.    Pertinent History  surgery to remove part of descending colon    Patient Stated Goals  get rid of pain and leakage    Currently in Pain?  No/denies         [facility] PT Assessment - 05/07/20 0001      Assessment   Medical Diagnosis  R15.9 (ICD-10-CM) - Full incontinence of feces    Referring Provider (PT)  Ileana Roup, MD                   [facility] Adult PT Treatment/Exercise - 05/07/20 0001      Lumbar Exercises: Stretches   Other Lumbar  Stretch Exercise  reviewed HEP      Manual Therapy   Manual Therapy  Internal Pelvic Floor;Soft tissue mobilization    Manual therapy comments  pt identity confirmed and internal soft tissue performed    Soft tissue mobilization  lumbar erectors    Internal Pelvic Floor  levators and puborectalis; attemtp to give cues to relax very [facility]             PT Education - 05/07/20 1007    Education Details  Access Code: [email or web address]    Person(s) Educated  Patient    Methods  Explanation;Demonstration;Handout;Verbal cues    Comprehension  Verbalized understanding;Returned demonstration       PT Short Term Goals - 04/02/20 0910      PT SHORT TERM GOAL #1   Title  Pt will be ind with urge drills    Status  Achieved      PT SHORT TERM GOAL #2   Title  Pt will be ind with basic core and pelvic floor strengthening exercises    Status  Achieved        PT Long Term Goals - 05/07/20 0942      PT LONG TERM GOAL  #1   Title  Independent in HEP    Baseline  still needs more education on how to relax the muscles, doing well with contract and lift pelvic floor    Time  8    Period  Weeks    Status  Partially Met    Target Date  07/02/20      PT LONG TERM GOAL #2   Title  Pt will demonstrate understanding of improved sitting and standing posture    Status  Achieved      PT LONG TERM GOAL #3   Title  Pt will report only needs 1 pad/day    Baseline  still using pads daily    Time  8    Period  Weeks    Status  On-going    Target Date  07/02/20      PT LONG TERM GOAL #4   Title  Pt will report 50% less urgency    Baseline  60% better    Status  Achieved      PT LONG TERM GOAL #5   Title  Pt will report 80% less pain in rectum due to being able to relax and bulge pelvic floor    Baseline  pain in rectum    Time  8    Period  Weeks    Status  Revised    Target Date  07/02/20            Plan - 05/07/20 1047    Clinical Impression Statement  Pt is still having increased leakage.  She has been having more difficulty relaxing and patient was given more stretches and breathing to do for homework this week.  She has improved strength overall, but has been educated in the importance of stretching and relaxing as well.  It appears that she is having muscle spasms of levators and puborectalis muscles . Pt is tight and TTP throughout when performing STM to internal pelvic floor muscles as well as lumbar erectors.  Pt will benefit from skilled PT to continue with goals and additional goal that was revised for pain due to meeting some of her initial goals.    Personal Factors and Comorbidities  Comorbidity 1;Age;Time since onset of injury/illness/exacerbation    Examination-Activity Limitations  Toileting;Continence  Examination-Participation Restrictions  Cleaning;Community Activity;Meal Prep;Laundry    Stability/Clinical Decision Making  Stable/Uncomplicated    Rehab Potential  Good    PT  Frequency  1x / week    PT Duration  4 weeks    PT Treatment/Interventions  ADLs/Self Care Home Management;Neuromuscular re-education;Vestibular;Canalith Repostioning;Patient/family education;Therapeutic activities;Therapeutic exercise;Biofeedback;Electrical Stimulation;Manual techniques;Taping;Dry needling    PT Next Visit Plan  lumbar STM; internal STM; breathing and stretches for relax pelvic floor; sitting on ball; massage wand    PT Home Exercise Plan  [email or web address]    Consulted and Agree with Plan of Care  Patient       Patient will benefit from skilled therapeutic intervention in order to improve the following deficits and impairments:  Dizziness, Decreased balance, Difficulty walking, Decreased strength, Impaired tone, Increased muscle spasms, Pain, Decreased coordination  Visit Diagnosis: Muscle weakness (generalized) - Plan: PT plan of care cert/re-cert  Unspecified lack of coordination - Plan: PT plan of care cert/re-cert     Problem List Patient Active Problem List   Diagnosis Date Noted  . Right upper quadrant pain 01/23/2020  . OSA on CPAP 04/13/2019  . Lymphadenopathy, retroperitoneal 02/03/2019  . BPPV (benign paroxysmal positional vertigo) 10/25/2018  . GERD (gastroesophageal reflux disease) 06/04/2017  . Smokers' cough ([place]) 03/19/2016  . Aortic atherosclerosis ([place]) 03/19/2016  . Dilated aortic root ([place]) 03/19/2016  . Routine general medical examination at a health care facility 09/25/2015  . Thoracic ascending aortic aneurysm ([place])   . Lung nodule seen on imaging study   . Chronic midline posterior neck pain 08/04/2014  . Hematuria, microscopic 05/31/2014  . Vitamin D deficiency 05/31/2014  . Arthralgia 05/24/2014  . Fecal incontinence 05/24/2014  . Hyperlipidemia 10/03/2013  . Acquired hypothyroidism 08/16/2013  . Glaucoma 08/16/2013  . Allergic rhinitis 08/16/2013  . HTN (hypertension) 08/16/2013    Jule Ser, PT 05/07/2020, 10:59 AM  [facility] [address], [place], [place], [address] Phone: [phone]   Fax:  [phone]  Name: Rachel Gardner MRN: [id number] Date of Birth: July 30, 1946

## 2020-05-08 DIAGNOSIS — R159 Full incontinence of feces: Secondary | ICD-10-CM | POA: Diagnosis not present

## 2020-05-15 ENCOUNTER — Encounter: Payer: Self-pay | Admitting: Internal Medicine

## 2020-05-18 ENCOUNTER — Ambulatory Visit (INDEPENDENT_AMBULATORY_CARE_PROVIDER_SITE_OTHER): Payer: Medicare Other | Admitting: Internal Medicine

## 2020-05-18 ENCOUNTER — Other Ambulatory Visit: Payer: Self-pay

## 2020-05-18 ENCOUNTER — Encounter: Payer: Self-pay | Admitting: Internal Medicine

## 2020-05-18 VITALS — BP 124/70 | HR 86 | Temp 99.0°F | Ht 61.5 in

## 2020-05-18 DIAGNOSIS — R6889 Other general symptoms and signs: Secondary | ICD-10-CM | POA: Diagnosis not present

## 2020-05-18 NOTE — Progress Notes (Signed)
   Subjective:   Patient ID: Rachel Gardner, female    DOB: 05-10-1946, 74 y.o.   MRN: [id number]  HPI The patient is a 74 YO female coming in for concern about loss of feeling in her rectum. She has previously had some fecal incontinence but in the last few weeks to months this has changed and worsened. She is working with PT to help and now has no feeling when they are working with on in and around the rectum. She has also had loss of urine several times which is unusual for her.   Review of Systems  Constitutional: Negative.   HENT: Negative.   Eyes: Negative.   Respiratory: Negative for cough, chest tightness and shortness of breath.   Cardiovascular: Negative for chest pain, palpitations and leg swelling.  Gastrointestinal: Negative for abdominal distention, abdominal pain, constipation, diarrhea, nausea and vomiting.       Fecal incontinence  Genitourinary:       Incontinence  Musculoskeletal: Positive for back pain.  Skin: Negative.   Neurological: Negative.   Psychiatric/Behavioral: Negative.     Objective:  Physical Exam Constitutional:      Appearance: She is well-developed.  HENT:     Head: Normocephalic and atraumatic.  Cardiovascular:     Rate and Rhythm: Normal rate and regular rhythm.  Pulmonary:     Effort: Pulmonary effort is normal. No respiratory distress.     Breath sounds: Normal breath sounds. No wheezing or rales.  Abdominal:     General: Bowel sounds are normal. There is no distension.     Palpations: Abdomen is soft.     Tenderness: There is no abdominal tenderness. There is no rebound.  Musculoskeletal:        General: Tenderness present.     Cervical back: Normal range of motion.  Skin:    General: Skin is warm and dry.  Neurological:     Mental Status: She is alert and oriented to person, place, and time.     Coordination: Coordination normal.     Vitals:   05/18/20 0848  BP: 124/70  Pulse: 86  Temp: 99 F (37.2 C)  SpO2: 99%   Height: 5' 1.5" (1.562 m)    This visit occurred during the SARS-CoV-2 public health emergency.  Safety protocols were in place, including screening questions prior to the visit, additional usage of staff PPE, and extensive cleaning of exam room while observing appropriate contact time as indicated for disinfecting solutions.   Assessment & Plan:

## 2020-05-18 NOTE — Assessment & Plan Note (Signed)
Ordered CT lumbar, thoracic, pelvic to rule out cauda equina with new change in sensation rectal and loss of urine. She is also having some more low back pain.

## 2020-05-18 NOTE — Patient Instructions (Signed)
We will get the scans to see what is wrong with the back or bowels.

## 2020-05-22 ENCOUNTER — Encounter: Payer: Self-pay | Admitting: Internal Medicine

## 2020-05-23 ENCOUNTER — Ambulatory Visit
Admission: RE | Admit: 2020-05-23 | Discharge: 2020-05-23 | Disposition: A | Discharge status: Home / Self Care | Payer: Medicare Other | Source: Ambulatory Visit | Attending: Internal Medicine | Admitting: Internal Medicine

## 2020-05-23 ENCOUNTER — Other Ambulatory Visit: Payer: Self-pay | Admitting: Internal Medicine

## 2020-05-23 ENCOUNTER — Encounter: Payer: Self-pay | Admitting: Internal Medicine

## 2020-05-23 ENCOUNTER — Telehealth: Payer: Self-pay

## 2020-05-23 ENCOUNTER — Other Ambulatory Visit: Payer: Self-pay

## 2020-05-23 DIAGNOSIS — R6889 Other general symptoms and signs: Secondary | ICD-10-CM

## 2020-05-23 DIAGNOSIS — K5730 Diverticulosis of large intestine without perforation or abscess without bleeding: Secondary | ICD-10-CM | POA: Diagnosis not present

## 2020-05-23 DIAGNOSIS — M549 Dorsalgia, unspecified: Secondary | ICD-10-CM | POA: Diagnosis not present

## 2020-05-23 DIAGNOSIS — N200 Calculus of kidney: Secondary | ICD-10-CM | POA: Diagnosis not present

## 2020-05-23 NOTE — Telephone Encounter (Signed)
F/u   The patient is at [facility] now   The patient can have her appt if Dr.Crawford is okay with her not drinking the contrast.   Please advise.

## 2020-05-23 NOTE — Telephone Encounter (Signed)
New message    [facility] voiced try to talk with the patient today to schedule appt due to the language barrier they were unable to schedule an appointment and verify health history.   [facility] is asking can someone reach out to the son to contact their office to schedule the appt.

## 2020-05-23 NOTE — Telephone Encounter (Signed)
Shirlean Mylar called from [facility] saying that patient had walked in for triple study.   Spoke with Dr. Sharlet Salina yes imaging is suppose to be done without contrast so its ok to proceed to do imaging without contrast today

## 2020-05-24 ENCOUNTER — Encounter: Payer: Self-pay | Admitting: Internal Medicine

## 2020-05-24 DIAGNOSIS — M5416 Radiculopathy, lumbar region: Secondary | ICD-10-CM

## 2020-06-04 DIAGNOSIS — M4316 Spondylolisthesis, lumbar region: Secondary | ICD-10-CM | POA: Diagnosis not present

## 2020-06-05 ENCOUNTER — Other Ambulatory Visit: Payer: Self-pay | Admitting: Neurosurgery

## 2020-06-05 ENCOUNTER — Telehealth: Payer: Self-pay | Admitting: Nurse Practitioner

## 2020-06-05 DIAGNOSIS — M4316 Spondylolisthesis, lumbar region: Secondary | ICD-10-CM

## 2020-06-05 NOTE — Telephone Encounter (Signed)
Phone call to patient to verify medication list and allergies for myelogram procedure. Spoke with pt and son. Pt aware she will not need to hold any medications for this procedure. Pre and post procedure instructions reviewed with pt. Pt verbalized understanding.

## 2020-06-07 DIAGNOSIS — H1132 Conjunctival hemorrhage, left eye: Secondary | ICD-10-CM | POA: Diagnosis not present

## 2020-06-12 ENCOUNTER — Ambulatory Visit: Payer: Medicare Other | Attending: Surgery | Admitting: Physical Therapy

## 2020-06-12 ENCOUNTER — Other Ambulatory Visit: Payer: Self-pay

## 2020-06-12 ENCOUNTER — Encounter: Payer: Self-pay | Admitting: Physical Therapy

## 2020-06-12 DIAGNOSIS — R279 Unspecified lack of coordination: Secondary | ICD-10-CM | POA: Diagnosis not present

## 2020-06-12 DIAGNOSIS — M6281 Muscle weakness (generalized): Secondary | ICD-10-CM

## 2020-06-12 NOTE — Therapy (Signed)
[facility] [address] [place], [place], [address] Phone: [phone]   Fax:  [phone]  Physical Therapy Treatment Progress Note Reporting Period 02/23/20 to 06/12/20   See note below for Objective Data and Assessment of Progress/Goals.      Patient Details  Name: Rachel Gardner MRN: [id number] Date of Birth: [birth year] Referring Provider (PT): Ileana Roup, MD   Encounter Date: 06/12/2020   PT End of Session - 06/12/20 1233    Visit Number 10    Date for PT Re-Evaluation 07/02/20    Authorization Type BCBS Medicare    PT Start Time 1230    PT Stop Time 1313    PT Time Calculation (min) 43 min    Activity Tolerance Patient tolerated treatment well    Behavior During Therapy [facility] for tasks assessed/performed           Past Medical History:  Diagnosis Date  . Allergy    hay fever  . Ascending aortic aneurysm ([place])   . Blood in stool   . BPPV (benign paroxysmal positional vertigo)   . CPAP (continuous positive airway pressure) dependence 02/09/2019  . Diverticulitis   . Fatty liver 05/28/10   per CT at [facility]  . Genital herpes   . GERD (gastroesophageal reflux disease)   . Glaucoma   . Helicobacter pylori infection   . Hiatal hernia 05/28/10   CT @ [facility]  . Hyperlipidemia   . Hypertension   . Hypothyroidism   . 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 Kidney stones   . Lung nodule seen on imaging study   . Meniere's disease   . Obstructive sleep apnea   . OSA on CPAP 04/13/2019   Moderate with AHI 20/hr now on CPAP at 13cm H2O    Past Surgical History:  Procedure Laterality Date  . ABDOMINAL HYSTERECTOMY  1995  . ANAL RECTAL MANOMETRY N/A 08/06/2016   Procedure: ANO RECTAL MANOMETRY;  Surgeon: Doran Stabler, MD;  Location: [facility];  Service: Gastroenterology;  Laterality: N/A;  . APPENDECTOMY  1989  . BPPV     x 4 after L cochlear implant   . BREAST SURGERY  1989   breast reduction  . CHOLECYSTECTOMY  1989  . COCHLEAR IMPLANT     Left/2009; Right/2012  . LAPAROSCOPIC SIGMOID COLECTOMY  2012   with takedown of splenic flexure; cystoscopy with bilateral ureteral stent placement  . REDUCTION MAMMAPLASTY Bilateral 1989   [place]  . TOTAL THYROIDECTOMY  1995    There were no vitals filed for this visit.   Subjective Assessment - 06/12/20 1239    Subjective Pt comes to PT today due to continued difficulty with leakage and difficulty pushing to have BM.  Pt is very anxious and is getting frustrated easily today.    Pertinent History surgery to remove part of descending colon    Patient Stated Goals get rid of pain and leakage    Currently in Pain? No/denies                          Pelvic Floor Special Questions - 06/12/20 0001    Pelvic Floor Internal Exam pt identity confirmed and infomred consent given to perform internal soft tissue assessment    Exam Type Rectal    Strength fair squeeze, definite lift    Tone difficulty relaxing and bulging  [facility] Adult PT Treatment/Exercise - 06/12/20 0001      Neuro Re-ed    Neuro Re-ed Details  biofeedback used; 3-34m resting tone reduced to 2.5-3 after session due to fatigue; attempting to prevent muscle contraction during bulge for improved toileting function, tactile and visual cues given today needed extra time for explanations      Lumbar Exercises: Supine   Other Supine Lumbar Exercises graduated contraction with biofeedback program; cues to relax and exhale with bulging                    PT Short Term Goals - 04/02/20 0910      PT SHORT TERM GOAL #1   Title Pt will be ind with urge drills    Status Achieved      PT SHORT TERM GOAL #2   Title Pt will be ind with basic core and pelvic floor strengthening exercises    Status Achieved             PT Long Term Goals - 05/07/20 0942      PT LONG TERM GOAL #1   Title  Independent in HEP    Baseline still needs more education on how to relax the muscles, doing well with contract and lift pelvic floor    Time 8    Period Weeks    Status Partially Met    Target Date 07/02/20      PT LONG TERM GOAL #2   Title Pt will demonstrate understanding of improved sitting and standing posture    Status Achieved      PT LONG TERM GOAL #3   Title Pt will report only needs 1 pad/day    Baseline still using pads daily    Time 8    Period Weeks    Status On-going    Target Date 07/02/20      PT LONG TERM GOAL #4   Title Pt will report 50% less urgency    Baseline 60% better    Status Achieved      PT LONG TERM GOAL #5   Title Pt will report 80% less pain in rectum due to being able to relax and bulge pelvic floor    Baseline pain in rectum    Time 8    Period Weeks    Status Revised    Target Date 07/02/20                 Plan - 06/12/20 1602    Clinical Impression Statement Today's treatment focuses on bulging pelvic floor.  Pt was given TC and biofeedback today.  Pt demonstrates occasionally doing correctly but gets very easily frustrated and tended to tight pelvic floor instead of lengthening the muscles when she attempts to bulge.  Pt is concerned that she cannot feel this and reports she is going to get more imaging done to make sure it is nothing nerve related.    PT Treatment/Interventions ADLs/Self Care Home Management;Neuromuscular re-education;Vestibular;Canalith Repostioning;Patient/family education;Therapeutic activities;Therapeutic exercise;Biofeedback;Electrical Stimulation;Manual techniques;Taping;Dry needling    PT Next Visit Plan practice bulging pelvic floor, biofeedback and variet of tactile cues to make sure she is doing correctly    PT Home Exercise Plan [id number]    Consulted and Agree with Plan of Care Patient           Patient will benefit from skilled therapeutic intervention in order to improve the following deficits and  impairments:  Dizziness, Decreased balance, Difficulty walking, Decreased strength, Impaired  tone, Increased muscle spasms, Pain, Decreased coordination  Visit Diagnosis: Muscle weakness (generalized)  Unspecified lack of coordination     Problem List Patient Active Problem List   Diagnosis Date Noted  . Suspected cauda equina syndrome 05/18/2020  . Right upper quadrant pain 01/23/2020  . OSA on CPAP 04/13/2019  . Lymphadenopathy, retroperitoneal 02/03/2019  . BPPV (benign paroxysmal positional vertigo) 10/25/2018  . GERD (gastroesophageal reflux disease) 06/04/2017  . Smokers' cough ([place]) 03/19/2016  . Aortic atherosclerosis ([place]) 03/19/2016  . Dilated aortic root ([place]) 03/19/2016  . Routine general medical examination at a health care facility 09/25/2015  . Thoracic ascending aortic aneurysm ([place])   . Lung nodule seen on imaging study   . Chronic midline posterior neck pain 08/04/2014  . Hematuria, microscopic 05/31/2014  . Vitamin D deficiency 05/31/2014  . Arthralgia 05/24/2014  . Fecal incontinence 05/24/2014  . Hyperlipidemia 10/03/2013  . Acquired hypothyroidism 08/16/2013  . Glaucoma 08/16/2013  . Allergic rhinitis 08/16/2013  . HTN (hypertension) 08/16/2013    Jule Ser, PT 06/12/2020, 4:12 PM  [facility] [address], [place], [place], [address] Phone: [phone]   Fax:  [phone]  Name: Rachel Gardner MRN: [id number] Date of Birth: 06/24/1946

## 2020-06-14 ENCOUNTER — Other Ambulatory Visit: Payer: Self-pay

## 2020-06-14 ENCOUNTER — Ambulatory Visit
Admission: RE | Admit: 2020-06-14 | Discharge: 2020-06-14 | Disposition: A | Discharge status: Home / Self Care | Payer: Medicare Other | Source: Ambulatory Visit | Attending: Neurosurgery | Admitting: Neurosurgery

## 2020-06-14 DIAGNOSIS — M4316 Spondylolisthesis, lumbar region: Secondary | ICD-10-CM

## 2020-06-14 DIAGNOSIS — M48061 Spinal stenosis, lumbar region without neurogenic claudication: Secondary | ICD-10-CM | POA: Diagnosis not present

## 2020-06-14 DIAGNOSIS — M5127 Other intervertebral disc displacement, lumbosacral region: Secondary | ICD-10-CM | POA: Diagnosis not present

## 2020-06-14 MED ORDER — IOPAMIDOL (ISOVUE-M 200) INJECTION 41%
20.0000 mL | Freq: Once | INTRAMUSCULAR | Status: AC
  Administered 2020-06-14: 20 mL via INTRATHECAL

## 2020-06-14 MED ORDER — DIAZEPAM 5 MG PO TABS
5.0000 mg | ORAL_TABLET | Freq: Once | ORAL | Status: AC
  Administered 2020-06-14: 5 mg via ORAL

## 2020-06-14 NOTE — Discharge Instructions (Signed)

## 2020-06-18 ENCOUNTER — Encounter: Payer: Medicare Other | Admitting: Physical Therapy

## 2020-06-22 DIAGNOSIS — M4316 Spondylolisthesis, lumbar region: Secondary | ICD-10-CM | POA: Diagnosis not present

## 2020-06-25 ENCOUNTER — Ambulatory Visit: Payer: Medicare Other | Admitting: Physical Therapy

## 2020-06-25 ENCOUNTER — Encounter: Payer: Self-pay | Admitting: Physical Therapy

## 2020-06-25 ENCOUNTER — Other Ambulatory Visit: Payer: Self-pay

## 2020-06-25 DIAGNOSIS — R279 Unspecified lack of coordination: Secondary | ICD-10-CM | POA: Diagnosis not present

## 2020-06-25 DIAGNOSIS — M6281 Muscle weakness (generalized): Secondary | ICD-10-CM

## 2020-06-25 NOTE — Therapy (Signed)
[facility] [address], [place], [place], [address] Phone: [phone]   Fax:  [phone]  Physical Therapy Treatment  Patient Details  Name: Crista Nuon MRN: [id number] Date of Birth: 12-09-1946 Referring Provider (PT): Ileana Roup, MD   Encounter Date: 06/25/2020   PT End of Session - 06/25/20 0829    Visit Number 11    Date for PT Re-Evaluation 08/20/20    Authorization Type BCBS Medicare    PT Start Time 0832    PT Stop Time 0919    PT Time Calculation (min) 47 min    Activity Tolerance Patient tolerated treatment well    Behavior During Therapy [facility] for tasks assessed/performed           Past Medical History:  Diagnosis Date   Allergy    hay fever   Ascending aortic aneurysm ([place])    Blood in stool    BPPV (benign paroxysmal positional vertigo)    CPAP (continuous positive airway pressure) dependence 02/09/2019   Diverticulitis    Fatty liver 05/28/10   per CT at [facility]   Genital herpes    GERD (gastroesophageal reflux disease)    Glaucoma    Helicobacter pylori infection    Hiatal hernia 05/28/10   CT @ [facility]   Hyperlipidemia    Hypertension    Hypothyroidism    Incontinence of urine in female    Kidney stones    Lung nodule seen on imaging study    Meniere's disease    Obstructive sleep apnea    OSA on CPAP 04/13/2019   Moderate with AHI 20/hr now on CPAP at 13cm H2O    Past Surgical History:  Procedure Laterality Date   [place] N/A 08/06/2016   Procedure: [place];  Surgeon: Doran Stabler, MD;  Location: [facility];  Service: Gastroenterology;  Laterality: N/A;   APPENDECTOMY  1989   BPPV     x 4 after L cochlear implant   BREAST SURGERY  1989   breast reduction   CHOLECYSTECTOMY  1989   COCHLEAR IMPLANT     Left/2009; Right/2012   LAPAROSCOPIC  SIGMOID COLECTOMY  2012   with takedown of splenic flexure; cystoscopy with bilateral ureteral stent placement   REDUCTION MAMMAPLASTY Bilateral 1989   [place]   TOTAL THYROIDECTOMY  1995    There were no vitals filed for this visit.   Subjective Assessment - 06/25/20 0837    Subjective I drink a lot of water so sometimes I can make it and sometimes I can't.  Wake up at night 2-3x/ night    Pertinent History surgery to remove part of descending colon    Patient Stated Goals get rid of pain and leakage    Currently in Pain? No/denies              [facility] PT Assessment - 06/25/20 0001      Assessment   Medical Diagnosis R15.9 (ICD-10-CM) - Full incontinence of feces    Referring Provider (PT) Ileana Roup, MD      Posture/Postural Control   Postural Limitations Flexed trunk;Rounded Shoulders      Strength   Overall Strength Comments hip adduction and ext 4/5 MMT      Flexibility   Hamstrings 75%      Palpation   Palpation comment hamstring and lumbar paraspinals tight  Ambulation/Gait   Gait Pattern Trunk flexed           Contract, relax, bulge - practice with tactile cues           Pelvic Floor Special Questions - 06/25/20 0001    Pelvic Floor Internal Exam pt identity confirmed and infomred consent given to perform internal soft tissue assessment    Exam Type Rectal    Palpation decreased ability to relax; 2 contract/relax in 10 sec    Strength fair squeeze, definite lift    Strength # of seconds 8                       PT Short Term Goals - 04/02/20 0910      PT SHORT TERM GOAL #1   Title Pt will be ind with urge drills    Status Achieved      PT SHORT TERM GOAL #2   Title Pt will be ind with basic core and pelvic floor strengthening exercises    Status Achieved             PT Long Term Goals - 06/25/20 0844      PT LONG TERM GOAL #1   Title Independent in HEP    Baseline re-assessed today with new findings  from lumbar and sacral spine    Time 8    Period Weeks    Status On-going    Target Date 08/20/20      PT LONG TERM GOAL #2   Title Pt will report nocturia <2x/ night    Time 8    Period Weeks    Status New    Target Date 08/20/20      PT LONG TERM GOAL #3   Title Pt will report only needs <2 underwear or 10/week    Baseline 15/week    Time 8    Period Weeks    Status New    Target Date 08/20/20      PT LONG TERM GOAL #4   Title Pt will report she can feel when she will have gas at least 50% of the time and be able to stop it    Time 8    Period Weeks    Status New    Target Date 08/20/20      PT LONG TERM GOAL #5   Title Pt will report she can eat out and be able to get to the bathroom    Baseline cannot eat out    Time 8    Period Weeks    Status New    Target Date 08/20/20                 Plan - 06/25/20 1093    Clinical Impression Statement Pt is returning for re-eval today due to ongoing issues of fecal incontinence. She demonstrates flexed trunk posture.  She has decreased coordination of pelvic floor and difficulty relaxing. Pt has LE weakness as mentioned above.  She will benefit from skilled PT to continue to address issues also with new information from recent imaging of the lumbar spine as mentioned in comorbities below.    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, chronic back pain, colon resection    Examination-Activity Limitations Toileting;Continence    Examination-Participation Restrictions Cleaning;Community Activity;Meal Prep;Laundry    PT Frequency 2x / week    PT Duration  8 weeks    PT Treatment/Interventions ADLs/Self Care Home Management;Neuromuscular re-education;Vestibular;Canalith Repostioning;Patient/family education;Therapeutic activities;Therapeutic exercise;Biofeedback;Electrical Stimulation;Manual  techniques;Taping;Dry needling    PT Next Visit Plan core, hip, gluteal strengthening, f/u on bulging with contract/relax    PT Home Exercise Plan [id number]    Consulted and Agree with Plan of Care Patient           Patient will benefit from skilled therapeutic intervention in order to improve the following deficits and impairments:  Dizziness, Decreased balance, Difficulty walking, Decreased strength, Impaired tone, Increased muscle spasms, Pain, Decreased coordination  Visit Diagnosis: Muscle weakness (generalized)  Unspecified lack of coordination     Problem List Patient Active Problem List   Diagnosis Date Noted   Suspected cauda equina syndrome 05/18/2020   Right upper quadrant pain 01/23/2020   OSA on CPAP 04/13/2019   Lymphadenopathy, retroperitoneal 02/03/2019   BPPV (benign paroxysmal positional vertigo) 10/25/2018   GERD (gastroesophageal reflux disease) 06/04/2017   Smokers' cough ([place]) 03/19/2016   Aortic atherosclerosis ([place]) 03/19/2016   Dilated aortic root ([place]) 03/19/2016   Routine general medical examination at a health care facility 09/25/2015   Thoracic ascending aortic aneurysm ([facility])    Lung nodule seen on imaging study    Chronic midline posterior neck pain 08/04/2014   Hematuria, microscopic 05/31/2014   Vitamin D deficiency 05/31/2014   Arthralgia 05/24/2014   Fecal incontinence 05/24/2014   Hyperlipidemia 10/03/2013   Acquired hypothyroidism 08/16/2013   Glaucoma 08/16/2013   Allergic rhinitis 08/16/2013   HTN (hypertension) 08/16/2013    Camillo Flaming Marlisa Caridi, PT 06/25/2020, 10:17 AM  [facility] [address] [place], [place], [address] Phone: [phone]   Fax:  [phone]  Name: Brunette Lavalle MRN: [id number] Date of Birth: 06/16/1946

## 2020-07-09 ENCOUNTER — Encounter: Payer: Self-pay | Admitting: Surgery

## 2020-07-09 ENCOUNTER — Ambulatory Visit: Payer: Medicare Other | Attending: Surgery | Admitting: Physical Therapy

## 2020-07-09 ENCOUNTER — Other Ambulatory Visit: Payer: Self-pay

## 2020-07-09 ENCOUNTER — Encounter: Payer: Self-pay | Admitting: Physical Therapy

## 2020-07-09 DIAGNOSIS — R279 Unspecified lack of coordination: Secondary | ICD-10-CM | POA: Insufficient documentation

## 2020-07-09 DIAGNOSIS — M6281 Muscle weakness (generalized): Secondary | ICD-10-CM

## 2020-07-09 NOTE — Therapy (Addendum)
[facility] [address], [place], [place], [address] Phone: [phone]   Fax:  [phone]  Physical Therapy Treatment  Patient Details  Name: Elisabel Hanover MRN: [id number] Date of Birth: [birth year] Referring Provider (PT): Ileana Roup, MD   Encounter Date: 07/09/2020   PT End of Session - 07/09/20 0938    Visit Number 12    Date for PT Re-Evaluation 08/20/20    Authorization Type BCBS Medicare    PT Start Time 0930    PT Stop Time 1010    PT Time Calculation (min) 40 min    Activity Tolerance Patient tolerated treatment well    Behavior During Therapy [facility] for tasks assessed/performed           Past Medical History:  Diagnosis Date  . Allergy    hay fever  . Ascending aortic aneurysm ([place])   . Blood in stool   . BPPV (benign paroxysmal positional vertigo)   . CPAP (continuous positive airway pressure) dependence 02/09/2019  . Diverticulitis   . Fatty liver 05/28/10   per CT at [facility]  . Genital herpes   . GERD (gastroesophageal reflux disease)   . Glaucoma   . Helicobacter pylori infection   . Hiatal hernia 05/28/10   CT @ [employer] [facility]  . Hyperlipidemia   . Hypertension   . Hypothyroidism   . 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 Kidney stones   . Lung nodule seen on imaging study   . Meniere's disease   . Obstructive sleep apnea   . OSA on CPAP 04/13/2019   Moderate with AHI 20/hr now on CPAP at 13cm H2O    Past Surgical History:  Procedure Laterality Date  . ABDOMINAL HYSTERECTOMY  1995  . ANAL RECTAL MANOMETRY N/A 08/06/2016   Procedure: ANO RECTAL MANOMETRY;  Surgeon: Doran Stabler, MD;  Location: [facility];  Service: Gastroenterology;  Laterality: N/A;  . APPENDECTOMY  1989  . BPPV     x 4 after L cochlear implant  . BREAST SURGERY  1989   breast reduction  . CHOLECYSTECTOMY  1989  . COCHLEAR IMPLANT     Left/2009; Right/2012  . LAPAROSCOPIC  SIGMOID COLECTOMY  2012   with takedown of splenic flexure; cystoscopy with bilateral ureteral stent placement  . REDUCTION MAMMAPLASTY Bilateral 1989   [place]  . TOTAL THYROIDECTOMY  1995    There were no vitals filed for this visit.   Subjective Assessment - 07/09/20 0944    Subjective This week I had no leakage other than one time I felt my stomach upset and couldn't make it.  I woke up to use the bathroom 1-2x this whole week.    Pertinent History surgery to remove part of descending colon    Patient Stated Goals get rid of pain and leakage    Currently in Pain? No/denies                             [facility] Adult PT Treatment/Exercise - 07/09/20 0001      Lumbar Exercises: Stretches   Other Lumbar Stretch Exercise happy baby - 30 sec    Other Lumbar Stretch Exercise scaitic nerve glide Lt LE 10x 5 sec      Lumbar Exercises: Supine   Pelvic Tilt 20 reps;10 seconds    Bridge 10 reps;20 reps    Bridge Limitations 20 reps with  red band clam      Manual Therapy   Manual Therapy Myofascial release    Myofascial Release abdominal release around the colon                  PT Education - 07/09/20 1013    Education Details Access Code: [email or web address]    Person(s) Educated Patient    Methods Explanation;Demonstration;Verbal cues;Handout    Comprehension Verbalized understanding;Returned demonstration            PT Short Term Goals - 04/02/20 0910      PT SHORT TERM GOAL #1   Title Pt will be ind with urge drills    Status Achieved      PT SHORT TERM GOAL #2   Title Pt will be ind with basic core and pelvic floor strengthening exercises    Status Achieved             PT Long Term Goals - 06/25/20 0844      PT LONG TERM GOAL #1   Title Independent in HEP    Baseline re-assessed today with new findings from lumbar and sacral spine    Time 8    Period Weeks    Status On-going    Target Date 08/20/20      PT LONG TERM GOAL #2   Title Pt will  report nocturia <2x/ night    Time 8    Period Weeks    Status New    Target Date 08/20/20      PT LONG TERM GOAL #3   Title Pt will report only needs <2 underwear or 10/week    Baseline 15/week    Time 8    Period Weeks    Status New    Target Date 08/20/20      PT LONG TERM GOAL #4   Title Pt will report she can feel when she will have gas at least 50% of the time and be able to stop it    Time 8    Period Weeks    Status New    Target Date 08/20/20      PT LONG TERM GOAL #5   Title Pt will report she can eat out and be able to get to the bathroom    Baseline cannot eat out    Time 8    Period Weeks    Status New    Target Date 08/20/20                 Plan - 07/09/20 0951    Clinical Impression Statement Today's treatment focused on core and hip strengthening due to feeling better last week and she was more concerned about the issues in her back.  Pt was able to tolerate exercises.  She had a little cramping in Lt hamstring initially with bridges but felt better after never glides on the Lt side.  Pt also got good fascial release did release around colon to see if this will help with the movement of stool and reduce excess force during a BM.    PT Treatment/Interventions ADLs/Self Care Home Management;Neuromuscular re-education;Vestibular;Canalith Repostioning;Patient/family education;Therapeutic activities;Therapeutic exercise;Biofeedback;Electrical Stimulation;Manual techniques;Taping;Dry needling    PT Next Visit Plan core, hip, gluteal strengthening, f/u on abdominal fascial release; may need to continue working on bulging    PT Home Exercise Plan [email or web address]    Consulted and Agree with Plan of Care Patient           Patient will  benefit from skilled therapeutic intervention in order to improve the following deficits and impairments:  Dizziness, Decreased balance, Difficulty walking, Decreased strength, Impaired tone, Increased muscle spasms, Pain, Decreased  coordination  Visit Diagnosis: Muscle weakness (generalized)  Unspecified lack of coordination     Problem List Patient Active Problem List   Diagnosis Date Noted  . Suspected cauda equina syndrome 05/18/2020  . Right upper quadrant pain 01/23/2020  . OSA on CPAP 04/13/2019  . Lymphadenopathy, retroperitoneal 02/03/2019  . BPPV (benign paroxysmal positional vertigo) 10/25/2018  . GERD (gastroesophageal reflux disease) 06/04/2017  . Smokers' cough ([place]) 03/19/2016  . Aortic atherosclerosis ([place]) 03/19/2016  . Dilated aortic root ([place]) 03/19/2016  . Routine general medical examination at a health care facility 09/25/2015  . Thoracic ascending aortic aneurysm ([place])   . Lung nodule seen on imaging study   . Chronic midline posterior neck pain 08/04/2014  . Hematuria, microscopic 05/31/2014  . Vitamin D deficiency 05/31/2014  . Arthralgia 05/24/2014  . Fecal incontinence 05/24/2014  . Hyperlipidemia 10/03/2013  . Acquired hypothyroidism 08/16/2013  . Glaucoma 08/16/2013  . Allergic rhinitis 08/16/2013  . HTN (hypertension) 08/16/2013    Camillo Flaming Donzell Coller,PT 07/09/2020, 10:32 AM  [facility] [address] [place], [place], [address] Phone: [phone]   Fax:  [phone]  Name: Anjelica Gorniak MRN: [id number] Date of Birth: Oct 24, 1946  PHYSICAL THERAPY DISCHARGE SUMMARY  Visits from Start of Care: 12  Current functional level related to goals / functional outcomes: See above goals   Remaining deficits: See above   Education / Equipment: HEP Plan: Patient agrees to discharge.  Patient goals were partially met. Patient is being discharged due to the patient's request.  ?????    Pt going to facility closer to home [facility], PT 08/06/20 8:44 AM

## 2020-07-09 NOTE — Patient Instructions (Signed)
Access Code: [email or web address] URL: [email or web address] Date: 07/09/2020 Prepared by: Jari Favre  Exercises Mini Squat with Pelvic Floor Contraction - 1 x daily - 7 x weekly - 10 reps - 3 sets Supine Pelvic Floor Contraction - 3 x daily - 7 x weekly - 10 reps - 1 sets - 3 sec hold Supine Hip Adductor Squeeze with Small Ball - 3 x daily - 7 x weekly - 10 reps - 1 sets - 3 sec hold Supine Pelvic Floor Contraction with Hip Internal External Rotation - 1 x daily - 7 x weekly - 10 reps - 3 sets Single Leg Balance with Pelvic Floor Contraction - 1 x daily - 7 x weekly - 10 reps - 3 sets Seated Pelvic Floor Contraction with Hip Abduction and Resistance Loop - 3 x daily - 7 x weekly - 1 sets - 10 reps - sec hold Seated Pelvic Floor Contraction with Isometric Hip Adduction - 3 x daily - 7 x weekly - 10 reps - 1 sets - 3 sechold, 3 sec rest hold Quadruped Rocking Slow - 1 x daily - 7 x weekly - 10 reps - 3 sets Beginner Clam - 1 x daily - 7 x weekly - 3 sets - 10 reps Prone Quadriceps Stretch with Strap - 1 x daily - 7 x weekly - 3 reps - 1 sets - 30sec hold Standing Hamstring Stretch with Step - 1 x daily - 7 x weekly - 3 reps - 1 sets - 30 sec hold Supine Butterfly Groin Stretch - 1 x daily - 7 x weekly - 3 reps - 1 sets - 30 sec hold Supine Pelvic Floor Stretch - 1 x daily - 7 x weekly - 3 reps - 1 sets - 30 sec hold Supine Bridge with Resistance Band - 1 x daily - 7 x weekly - 3 sets - 10 reps Supine Posterior Pelvic Tilt with Pelvic Floor Contraction - 1 x daily - 7 x weekly - 3 sets - 10 reps

## 2020-07-13 DIAGNOSIS — I10 Essential (primary) hypertension: Secondary | ICD-10-CM | POA: Diagnosis not present

## 2020-07-13 DIAGNOSIS — M4316 Spondylolisthesis, lumbar region: Secondary | ICD-10-CM | POA: Diagnosis not present

## 2020-07-16 ENCOUNTER — Encounter: Payer: Self-pay | Admitting: Internal Medicine

## 2020-07-16 ENCOUNTER — Other Ambulatory Visit: Payer: Self-pay | Admitting: Neurosurgery

## 2020-07-16 DIAGNOSIS — M4316 Spondylolisthesis, lumbar region: Secondary | ICD-10-CM

## 2020-07-17 ENCOUNTER — Ambulatory Visit (INDEPENDENT_AMBULATORY_CARE_PROVIDER_SITE_OTHER)
Admission: RE | Admit: 2020-07-17 | Discharge: 2020-07-17 | Disposition: A | Discharge status: Home / Self Care | Payer: Medicare Other | Source: Ambulatory Visit | Attending: Internal Medicine | Admitting: Internal Medicine

## 2020-07-17 ENCOUNTER — Other Ambulatory Visit: Payer: Self-pay

## 2020-07-17 ENCOUNTER — Other Ambulatory Visit: Payer: Self-pay | Admitting: Internal Medicine

## 2020-07-17 DIAGNOSIS — E2839 Other primary ovarian failure: Secondary | ICD-10-CM

## 2020-07-19 DIAGNOSIS — M9973 Connective tissue and disc stenosis of intervertebral foramina of lumbar region: Secondary | ICD-10-CM | POA: Diagnosis not present

## 2020-07-30 ENCOUNTER — Ambulatory Visit: Payer: Medicare Other | Admitting: Physical Therapy

## 2020-08-02 ENCOUNTER — Encounter: Payer: Medicare Other | Admitting: Physical Therapy

## 2020-08-06 ENCOUNTER — Encounter: Payer: Medicare Other | Admitting: Physical Therapy

## 2020-08-07 DIAGNOSIS — Z03818 Encounter for observation for suspected exposure to other biological agents ruled out: Secondary | ICD-10-CM | POA: Diagnosis not present

## 2020-08-07 DIAGNOSIS — Z20822 Contact with and (suspected) exposure to covid-19: Secondary | ICD-10-CM | POA: Diagnosis not present

## 2020-08-10 ENCOUNTER — Ambulatory Visit: Payer: Medicare Other | Admitting: Gastroenterology

## 2020-08-13 ENCOUNTER — Encounter: Payer: Medicare Other | Admitting: Physical Therapy

## 2020-08-16 ENCOUNTER — Encounter: Payer: Medicare Other | Admitting: Physical Therapy

## 2020-08-16 DIAGNOSIS — M532X7 Spinal instabilities, lumbosacral region: Secondary | ICD-10-CM | POA: Diagnosis not present

## 2020-08-20 ENCOUNTER — Encounter: Payer: Medicare Other | Admitting: Physical Therapy

## 2020-08-21 DIAGNOSIS — M532X7 Spinal instabilities, lumbosacral region: Secondary | ICD-10-CM | POA: Diagnosis not present

## 2020-08-22 ENCOUNTER — Ambulatory Visit: Payer: Medicare Other | Admitting: Physician Assistant

## 2020-08-22 ENCOUNTER — Encounter: Payer: Self-pay | Admitting: Physician Assistant

## 2020-08-22 VITALS — BP 122/80 | HR 88 | Ht 60.5 in | Wt 132.0 lb

## 2020-08-22 DIAGNOSIS — R151 Fecal smearing: Secondary | ICD-10-CM

## 2020-08-22 NOTE — Patient Instructions (Signed)
If you are age 73 or older, your body mass index should be between 23-30. Your Body mass index is 25.36 kg/m. If this is out of the aforementioned range listed, please consider follow up with your Primary Care Provider.  If you are age 68 or younger, your body mass index should be between 19-25. Your Body mass index is 25.36 kg/m. If this is out of the aformentioned range listed, please consider follow up with your Primary Care Provider.   Continue on a high fiber diet.  Continue with Physical Therapy.  Follow up with Dr. Loletha Carrow in 6 months.

## 2020-08-22 NOTE — Progress Notes (Signed)
Subjective:    Patient ID: Rachel Gardner, female    DOB: [birth year], 74 y.o.   MRN: [id number]  HPI Rachel Gardner is a very nice 74 year old white female, established with Dr. Loletha Carrow.  She has history of fecal incontinence, and comes in today for follow-up. Patient also with history of hypertension, sleep apnea, hypothyroidism, BPPV, she status post cholecystectomy. She had undergone colonoscopy in 2012 done elsewhere which was negative.  EGD per Dr. Olevia Perches in 2015 with mild gastritis, irregular Z-line.  Biopsies were negative for Barrett's. When seen in 2017 she underwent anal manometry which showed a very low anal sphincter resting and squeeze pressures and incomplete relaxation of the anal sphincter with attempts to expel balloon. She was subsequently referred to pelvic floor physical therapy.  She says this was quite helpful, and also helped her urinary incontinence. She has continued to have to use some pressure to the perineum with bowel movements to help allow stool to pass.  She is not having any ongoing daily fecal incontinence currently but says if her stools get loose she will be incontinent.  She had also had evaluation with colorectal surgery/Dr. Dema Severin previously who did not recommend any surgical procedure. Over the past several months she has been having problems with significant back pain.  She has undergone extensive evaluation with CT scans of the thoracic and lumbar spine, CT of the abdomen and pelvis and lumbar myelogram, all done in May and June 2021.  She then had consultation with a orthopedic surgeon at [facility] in July. With low back pain which had developed rather abruptly and was associated with bilateral leg pain and increase in incontinence, above work-up was pursued.  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  All other review of systems was otherwise negative. Patient has opted for nonsurgical management and has been undergoing physical therapy over the past couple of months. She says she has seen significant improvement and is no longer having any ongoing low back pain or leg pain and incontinence issues have significantly improved as well. She says at its worst she was having at least 3 episodes of fecal incontinence per week.  She is not having any fecal incontinence currently may have occasional smearing of stool especially if stool is loose.  She had also been having rectal pressure which has resolved as well  Outpatient Encounter Medications as of 08/22/2020  Medication Sig  . ascorbic acid (VITAMIN C) 1000 MG tablet Take 500 mg by mouth daily.   Marland Kitchen aspirin 81 MG chewable tablet Chew 81 mg by mouth daily.   . bimatoprost (LUMIGAN) 0.01 % SOLN Place 1 drop into both eyes at bedtime.  . Calcium Carb-Cholecalciferol (CALCIUM 500 + D3) 500-600 MG-UNIT TABS Take 1 Dose by mouth daily.   . fluticasone (FLONASE) 50 MCG/ACT nasal spray Place 2 sprays into both nostrils at bedtime.  Marland Kitchen levothyroxine (SYNTHROID) 150 MCG tablet Take 1 tablet (150 mcg total) by mouth daily before breakfast.  . losartan-hydrochlorothiazide (HYZAAR) 100-25 MG per tablet Take 1 tablet by mouth daily.   Marland Kitchen  meclizine (ANTIVERT) 25 MG tablet Take 1 tablet (25 mg total) by mouth 3 (three) times daily as needed for dizziness.  . Omega-3 Fatty Acids (FISH OIL PO) Take 1 tablet by mouth daily.  . pantoprazole (PROTONIX) 40 MG tablet TAKE 1 TABLET(40 MG) BY MOUTH TWICE DAILY  . Probiotic Product  (PROBIOTIC ADVANCED PO) Take 1 capsule by mouth daily.   No facility-administered encounter medications on file as of 08/22/2020.   Allergies  Allergen Reactions  . Morphine And Related     Nausea Can tolerate Dilaudid  . Statins Other (See Comments)    Joint pain   . Tape Other (See Comments)    Causes redness and will take skin when removed  . Wellbutrin [Bupropion] Other (See Comments)    Suicidal intensions   Patient Active Problem List   Diagnosis Date Noted  . Estrogen deficiency 07/17/2020  . Suspected cauda equina syndrome 05/18/2020  . Right upper quadrant pain 01/23/2020  . OSA on CPAP 04/13/2019  . Lymphadenopathy, retroperitoneal 02/03/2019  . BPPV (benign paroxysmal positional vertigo) 10/25/2018  . GERD (gastroesophageal reflux disease) 06/04/2017  . Smokers' cough ([place]) 03/19/2016  . Aortic atherosclerosis ([place]) 03/19/2016  . Dilated aortic root ([place]) 03/19/2016  . Routine general medical examination at a health care facility 09/25/2015  . Thoracic ascending aortic aneurysm ([place])   . Lung nodule seen on imaging study   . Chronic midline posterior neck pain 08/04/2014  . Hematuria, microscopic 05/31/2014  . Vitamin D deficiency 05/31/2014  . Arthralgia 05/24/2014  . Fecal incontinence 05/24/2014  . Hyperlipidemia 10/03/2013  . Acquired hypothyroidism 08/16/2013  . Glaucoma 08/16/2013  . Allergic rhinitis 08/16/2013  . HTN (hypertension) 08/16/2013   Social History   Socioeconomic History  . Marital status: Divorced    Spouse name: Not on file  . Number of children: 3  . Years of education: Not on file  . Highest education level: Not on file  Occupational History  . Occupation: retired Marine scientist  Tobacco Use  . Smoking status: Current Every Day Smoker    Packs/day: 0.50    Years: 41.00    Pack years: 20.50    Types: Cigarettes    Last attempt to quit: 01/18/2016    Years since quitting: 4.5  . Smokeless tobacco: Never Used  Vaping Use  . Vaping  Use: Never used  Substance and Sexual Activity  . Alcohol use: Yes    Alcohol/week: 0.0 standard drinks    Comment: rarely  . Drug use: No  . Sexual activity: Never  Other Topics Concern  . Not on file  Social History Narrative   Regular exercise: walk; 1 mile a day / walk 5 miles weekend;    update 02/22/2020 not exercising   Caffeine use: 1-2 cups of coffee per day   3 children   Lives alone with her dogs, son lives next door   Social Determinants of Health   Financial Resource Strain:   . Difficulty of Paying Living Expenses: Not on file  Food Insecurity:   . Worried About Charity fundraiser in the Last Year: Not on file  . Ran Out of Food in the Last Year: Not on file  Transportation Needs:   . Lack of Transportation (Medical): Not on file  . Lack of Transportation (Non-Medical): Not on file  Physical Activity:   . Days of Exercise per Week: Not on file  . Minutes of Exercise per Session: Not on file  Stress:   .  Feeling of Stress : Not on file  Social Connections:   . Frequency of Communication with Friends and Family: Not on file  . Frequency of Social Gatherings with Friends and Family: Not on file  . Attends Religious Services: Not on file  . Active Member of Clubs or Organizations: Not on file  . Attends Archivist Meetings: Not on file  . Marital Status: Not on file  Intimate Partner Violence:   . Fear of Current or Ex-Partner: Not on file  . Emotionally Abused: Not on file  . Physically Abused: Not on file  . Sexually Abused: Not on file    Rachel Gardner's family history includes Colon cancer in her cousin and maternal aunt; Hyperlipidemia in her brother; Hypertension in her father, mother, and sister; Prostate cancer in her brother and paternal uncle; Thyroid disease in her sister.      Objective:    Vitals:   08/22/20 1028  BP: 122/80  Pulse: 88    Physical Exam Well-developed well-nourished older white female in no acute distress.   Pleasant, hard of hearing height, Weight, 132 BMI 25.3  HEENT; nontraumatic normocephalic, EOMI, PE R R LA, sclera anicteric.  External auditory device Oropharynx; not done Neck; supple, no JVD Cardiovascular; regular rate and rhythm with S1-S2, no murmur rub or gallop Pulmonary; Clear bilaterally Abdomen; soft, nontender, nondistended, no palpable mass or hepatosplenomegaly, bowel sounds are active Rectal; not done today Skin; benign exam, no jaundice rash or appreciable lesions Extremities; no clubbing cyanosis or edema skin warm and dry Neuro/Psych; alert and oriented x4, grossly nonfocal mood and affect appropriate       Assessment & Plan:   #50 74 year old white female with several year history of intermittent fecal incontinence secondary to very low anal sphincter resting and squeeze pressures and incomplete relaxation of the anal sphincter. Patient had improvement with pelvic floor physical therapy and had been managing well.  3 to 4 months ago she developed low back pain and bilateral posterior leg pain.  Subsequently has had extensive evaluation as outlined above with multilevel disc disease in the lumbar spine with some foraminal stenosis and nerve root involvement. She had significant increase in fecal incontinence after onset of back pain.  She has opted for nonsurgical management and has been undergoing physical therapy over the past couple of months with significant improvement in low back pain, and significant improvement in fecal incontinence.  #2 colon cancer screening-last colonoscopy 2012 will be due for follow-up 2022  #3 status post cholecystectomy 4.  GERD 5.  BPPV 6.  Sleep apnea  Plan; patient is doing well with current physical therapy, advised she continue current physical therapy, and exercises at home. She is aware that if she has recurrence of significant low back pain and/or reoccurrence of significant incontinence that she should seek urgent follow-up  with orthopedic neurosurgeon. Continue high-fiber diet We will plan follow-up colonoscopy 2022/Dr. Jackolyn Confer Rachel Deleeuw PA-C 08/22/2020   Cc: Hoyt Koch, *

## 2020-08-23 ENCOUNTER — Encounter: Payer: Medicare Other | Admitting: Physical Therapy

## 2020-08-23 DIAGNOSIS — M532X7 Spinal instabilities, lumbosacral region: Secondary | ICD-10-CM | POA: Diagnosis not present

## 2020-08-26 NOTE — Progress Notes (Signed)
____________________________________________________________  Attending physician addendum:  Thank you for sending this case to me. I have reviewed the entire note, and the outlined plan seems appropriate.  Dan Dissinger Danis, MD  ____________________________________________________________  

## 2020-09-04 DIAGNOSIS — M532X7 Spinal instabilities, lumbosacral region: Secondary | ICD-10-CM | POA: Diagnosis not present

## 2020-09-05 ENCOUNTER — Encounter: Payer: Self-pay | Admitting: Internal Medicine

## 2020-09-05 DIAGNOSIS — H401131 Primary open-angle glaucoma, bilateral, mild stage: Secondary | ICD-10-CM | POA: Diagnosis not present

## 2020-09-06 DIAGNOSIS — M532X7 Spinal instabilities, lumbosacral region: Secondary | ICD-10-CM | POA: Diagnosis not present

## 2020-09-11 DIAGNOSIS — M532X7 Spinal instabilities, lumbosacral region: Secondary | ICD-10-CM | POA: Diagnosis not present

## 2020-09-13 DIAGNOSIS — M532X7 Spinal instabilities, lumbosacral region: Secondary | ICD-10-CM | POA: Diagnosis not present

## 2020-09-14 ENCOUNTER — Encounter: Payer: Self-pay | Admitting: Internal Medicine

## 2020-09-14 DIAGNOSIS — Z1152 Encounter for screening for COVID-19: Secondary | ICD-10-CM | POA: Diagnosis not present

## 2020-10-18 ENCOUNTER — Encounter: Payer: Self-pay | Admitting: Internal Medicine

## 2020-10-23 DIAGNOSIS — Z9621 Cochlear implant status: Secondary | ICD-10-CM | POA: Diagnosis not present

## 2020-10-23 DIAGNOSIS — Z45321 Encounter for adjustment and management of cochlear device: Secondary | ICD-10-CM | POA: Diagnosis not present

## 2020-10-23 DIAGNOSIS — H903 Sensorineural hearing loss, bilateral: Secondary | ICD-10-CM | POA: Diagnosis not present

## 2020-10-26 ENCOUNTER — Encounter: Payer: Self-pay | Admitting: Internal Medicine

## 2020-11-06 ENCOUNTER — Encounter: Payer: Self-pay | Admitting: Internal Medicine

## 2020-11-12 ENCOUNTER — Other Ambulatory Visit: Payer: Self-pay

## 2020-11-12 ENCOUNTER — Encounter: Payer: Self-pay | Admitting: Cardiology

## 2020-11-12 ENCOUNTER — Ambulatory Visit: Payer: Medicare Other | Admitting: Cardiology

## 2020-11-12 VITALS — BP 112/60 | HR 92 | Ht 60.5 in | Wt 128.0 lb

## 2020-11-12 DIAGNOSIS — I10 Essential (primary) hypertension: Secondary | ICD-10-CM | POA: Diagnosis not present

## 2020-11-12 MED ORDER — LOSARTAN POTASSIUM 100 MG PO TABS
100.0000 mg | ORAL_TABLET | Freq: Every day | ORAL | 3 refills | Status: DC
Start: 2020-11-12 — End: 2022-03-07

## 2020-11-12 MED ORDER — HYDROCHLOROTHIAZIDE 25 MG PO TABS
25.0000 mg | ORAL_TABLET | Freq: Every day | ORAL | 3 refills | Status: DC
Start: 2020-11-12 — End: 2022-03-10

## 2020-11-12 NOTE — Patient Instructions (Signed)
Medication Instructions:  Please take Losartan 100 mg daily and Hydrochlorothiazide 25 mg once daily once you finish the Losartan-HCTZ combination. Continue all other medications as listed.  *If you need a refill on your cardiac medications before your next appointment, please call your pharmacy*  Follow-Up: At [facility], you and your health needs are our priority.  As part of our continuing mission to provide you with exceptional heart care, we have created designated Provider Care Teams.  These Care Teams include your primary Cardiologist (physician) and Advanced Practice Providers (APPs -  Physician Assistants and Nurse Practitioners) who all work together to provide you with the care you need, when you need it.  We recommend signing up for the patient portal called "MyChart".  Sign up information is provided on this After Visit Summary.  MyChart is used to connect with patients for Virtual Visits (Telemedicine).  Patients are able to view lab/test results, encounter notes, upcoming appointments, etc.  Non-urgent messages can be sent to your provider as well.   To learn more about what you can do with MyChart, go to [email or web address].    Your next appointment:   12 month(s)  The format for your next appointment:   In Person  Provider:   Candee Furbish, MD

## 2020-11-12 NOTE — Progress Notes (Signed)
Cardiology Office Note:    Date:  11/12/2020   ID:  Rachel Gardner, DOB 1946/04/19, MRN [id number]  PCP:  Hoyt Koch, MD  [facility] Cardiologist:  Candee Furbish, MD  [facility] Electrophysiologist:  None   Referring MD: Hoyt Koch, *     History of Present Illness:    Rachel Gardner is a 74 y.o. female here for the follow-up of coronary artery calcification dilated aortic root aortic atherosclerosis.  4.4 cm previously described but upon closer inspection 4 cm on most recent CT scan.  Left lower lobe nodule 7 mm stable.  Extensive coronary calcification also seen on CT.  Nuclear stress test was reassuring in 2017 when this was discovered with prior exertional tiredness and neck tightness.  2013 had BPPV in [place]. Has a cochlear implant. Has had extensive neurologic workup as well as all reassuring.    Statin intolerance- Lipitor, crestor, pravastatin, zocor,  - muscle pain. LDL 115. Stopped taking Zetia, trying to eat- celery, juice.   Quit smoking 12/2015. Retired Marine scientist. She enjoyed traveling from [place] to [name], [place], [place] for her 48 birthday.  Has breast pain. Anxious with this. Having stomach discomfort. Took PPI, pantoprazole.  Now feeling better.  She is going to be traveling to [place] to help a friend out for 2 weeks.  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  She has also been to [place].    Past Medical History:  Diagnosis Date  . Allergy    hay fever  . Ascending aortic aneurysm ([place])   . Blood in stool   . BPPV (benign paroxysmal positional vertigo)   . CPAP (continuous positive airway pressure) dependence 02/09/2019  . Diverticulitis   . Fatty liver 05/28/10   per CT at [facility]  . Genital herpes   . GERD (gastroesophageal reflux disease)   . Glaucoma   . Helicobacter pylori infection   . Hiatal hernia 05/28/10   CT @ [facility]  . Hyperlipidemia   .  Hypertension   . Hypothyroidism   . 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 Kidney stones   . Lung nodule seen on imaging study   . Meniere's disease   . Obstructive sleep apnea   . OSA on CPAP 04/13/2019   Moderate with AHI 20/hr now on CPAP at 13cm H2O    Past Surgical History:  Procedure Laterality Date  . ABDOMINAL HYSTERECTOMY  1995  . ANAL RECTAL MANOMETRY N/A 08/06/2016   Procedure: ANO RECTAL MANOMETRY;  Surgeon: Doran Stabler, MD;  Location: [facility];  Service: Gastroenterology;  Laterality: N/A;  . APPENDECTOMY  1989  . BPPV     x 4 after L cochlear implant  . BREAST SURGERY  1989   breast reduction  . CHOLECYSTECTOMY  1989  . COCHLEAR IMPLANT     Left/2009; Right/2012  . LAPAROSCOPIC SIGMOID COLECTOMY  2012   with takedown of splenic flexure; cystoscopy with bilateral ureteral stent placement  . REDUCTION MAMMAPLASTY Bilateral 1989   [place]  . TOTAL THYROIDECTOMY  1995    Current Medications: Current Meds  Medication Sig  . ascorbic acid (VITAMIN C) 1000 MG tablet Take 500 mg by mouth daily.   Marland Kitchen aspirin 81 MG chewable tablet Chew 81 mg by mouth daily.   . bimatoprost (LUMIGAN) 0.01 % SOLN Place 1 drop into both eyes at bedtime.  . Calcium Carb-Cholecalciferol (CALCIUM 500 + D3) 500-600  MG-UNIT TABS Take 1 Dose by mouth daily.   . fluticasone (FLONASE) 50 MCG/ACT nasal spray Place 2 sprays into both nostrils at bedtime.  Marland Kitchen levothyroxine (SYNTHROID) 150 MCG tablet Take 1 tablet (150 mcg total) by mouth daily before breakfast.  . meclizine (ANTIVERT) 25 MG tablet Take 1 tablet (25 mg total) by mouth 3 (three) times daily as needed for dizziness.  . Omega-3 Fatty Acids (FISH OIL PO) Take 1 tablet by mouth daily.  . pantoprazole (PROTONIX) 40 MG tablet TAKE 1 TABLET(40 MG) BY MOUTH TWICE DAILY  . Probiotic Product (PROBIOTIC ADVANCED PO) Take 1 capsule by mouth daily.  . [DISCONTINUED] losartan-hydrochlorothiazide (HYZAAR) 100-25 MG per tablet Take 1 tablet by mouth  daily.      Allergies:   Morphine, Morphine and related, Statins, Tape, Wellbutrin [bupropion], and Denture adhesive   Social History   Socioeconomic History  . Marital status: Divorced    Spouse name: Not on file  . Number of children: 3  . Years of education: Not on file  . Highest education level: Not on file  Occupational History  . Occupation: retired Marine scientist  Tobacco Use  . Smoking status: Current Every Day Smoker    Packs/day: 0.50    Years: 41.00    Pack years: 20.50    Types: Cigarettes    Last attempt to quit: 01/18/2016    Years since quitting: 4.8  . Smokeless tobacco: Never Used  Vaping Use  . Vaping Use: Never used  Substance and Sexual Activity  . Alcohol use: Yes    Alcohol/week: 0.0 standard drinks    Comment: rarely  . Drug use: No  . Sexual activity: Never  Other Topics Concern  . Not on file  Social History Narrative   Regular exercise: walk; 1 mile a day / walk 5 miles weekend;    update 02/22/2020 not exercising   Caffeine use: 1-2 cups of coffee per day   3 children   Lives alone with her dogs, son lives next door   Social Determinants of Health   Financial Resource Strain:   . Difficulty of Paying Living Expenses: Not on file  Food Insecurity:   . Worried About Charity fundraiser in the Last Year: Not on file  . Ran Out of Food in the Last Year: Not on file  Transportation Needs:   . Lack of Transportation (Medical): Not on file  . Lack of Transportation (Non-Medical): Not on file  Physical Activity:   . Days of Exercise per Week: Not on file  . Minutes of Exercise per Session: Not on file  Stress:   . Feeling of Stress : Not on file  Social Connections:   . Frequency of Communication with Friends and Family: Not on file  . Frequency of Social Gatherings with Friends and Family: Not on file  . Attends Religious Services: Not on file  . Active Member of Clubs or Organizations: Not on file  . Attends Archivist Meetings: Not on  file  . Marital Status: Not on file     Family History: The patient's family history includes Colon cancer in her cousin and maternal aunt; Hyperlipidemia in her brother; Hypertension in her father, mother, and sister; Prostate cancer in her brother and paternal uncle; Thyroid disease in her sister.  ROS:   Please see the history of present illness.     All other systems reviewed and are negative.  EKGs/Labs/Other Studies Reviewed:    The following studies  were reviewed today:  Nuclear stress test 2020-low risk no ischemia  EKG:  EKG is  ordered today.  The ekg ordered today demonstrates sinus rhythm 92 no ischemia  Recent Labs: 01/23/2020: ALT 14; BUN 27; Creatinine, Ser 0.93; Hemoglobin 14.4; Platelets 188.0; Potassium 3.1; Sodium 141 03/19/2020: TSH 0.65  Recent Lipid Panel    Component Value Date/Time   CHOL 228 (H) 10/17/2019 0804   CHOL 222 (H) 12/16/2016 0826   TRIG 187.0 (H) 10/17/2019 0804   HDL 38.60 (L) 10/17/2019 0804   HDL 48 12/16/2016 0826   CHOLHDL 6 10/17/2019 0804   VLDL 37.4 10/17/2019 0804   LDLCALC 152 (H) 10/17/2019 0804   LDLCALC 138 (H) 12/16/2016 0826   LDLDIRECT 166.0 02/07/2019 0907     Risk Assessment/Calculations:       Physical Exam:    VS:  BP 112/60   Pulse 92   Ht 5' 0.5" (1.537 m)   Wt 128 lb (58.1 kg)   SpO2 97%   BMI 24.59 kg/m     Wt Readings from Last 3 Encounters:  11/12/20 128 lb (58.1 kg)  08/22/20 132 lb (59.9 kg)  02/22/20 136 lb (61.7 kg)     GEN:  Well nourished, well developed in no acute distress HEENT: Normal NECK: No JVD; No carotid bruits LYMPHATICS: No lymphadenopathy CARDIAC: RRR, no murmurs, rubs, gallops RESPIRATORY:  Clear to auscultation without rales, wheezing or rhonchi  ABDOMEN: Soft, non-tender, non-distended MUSCULOSKELETAL:  No edema; No deformity  SKIN: Warm and dry NEUROLOGIC:  Alert and oriented x 3 PSYCHIATRIC:  Normal affect   ASSESSMENT:    1. Essential hypertension    PLAN:     In order of problems listed above:  Coronary artery calcifications/aortic atherosclerosis/prior atypical chest pain -Nuclear stress test 2020 once again reassuring.  No ischemia.  Continue with aggressive risk factor modification.  Statin intolerance -Previously have had lengthy conversations about statin use for risk factor modification.  She is tried several of them.  Not interested in further therapies, PCSK9 inhibitors.  Essential hypertension -Continue with losartan HCTZ well-controlled.  Aorta -4.1 cm stable on current CT from 01/25/2020.  LDL 152 hemoglobin A1c 5.9 hemoglobin 14.4 creatinine 0.9  Shared Decision Making/Informed Consent        Medication Adjustments/Labs and Tests Ordered: Current medicines are reviewed at length with the patient today.  Concerns regarding medicines are outlined above.  Orders Placed This Encounter  Procedures  . EKG 12-Lead   Meds ordered this encounter  Medications  . losartan (COZAAR) 100 MG tablet    Sig: Take 1 tablet (100 mg total) by mouth daily.    Dispense:  90 tablet    Refill:  3  . hydrochlorothiazide (HYDRODIURIL) 25 MG tablet    Sig: Take 1 tablet (25 mg total) by mouth daily.    Dispense:  90 tablet    Refill:  3    Patient Instructions  Medication Instructions:  Please take Losartan 100 mg daily and Hydrochlorothiazide 25 mg once daily once you finish the Losartan-HCTZ combination. Continue all other medications as listed.  *If you need a refill on your cardiac medications before your next appointment, please call your pharmacy*  Follow-Up: At [facility], you and your health needs are our priority.  As part of our continuing mission to provide you with exceptional heart care, we have created designated Provider Care Teams.  These Care Teams include your primary Cardiologist (physician) and Advanced Practice Providers (APPs -  Physician Assistants  and Nurse Practitioners) who all work together to provide you  with the care you need, when you need it.  We recommend signing up for the patient portal called "MyChart".  Sign up information is provided on this After Visit Summary.  MyChart is used to connect with patients for Virtual Visits (Telemedicine).  Patients are able to view lab/test results, encounter notes, upcoming appointments, etc.  Non-urgent messages can be sent to your provider as well.   To learn more about what you can do with MyChart, go to [email or web address].    Your next appointment:   12 month(s)  The format for your next appointment:   In Person  Provider:   Candee Furbish, MD       Signed, Candee Furbish, MD  11/12/2020 9:54 AM    [place]

## 2020-11-21 ENCOUNTER — Encounter: Payer: Medicare Other | Admitting: Internal Medicine

## 2020-12-03 DIAGNOSIS — M532X7 Spinal instabilities, lumbosacral region: Secondary | ICD-10-CM | POA: Diagnosis not present

## 2020-12-10 ENCOUNTER — Other Ambulatory Visit: Payer: Self-pay | Admitting: Surgery

## 2020-12-10 DIAGNOSIS — I7121 Aneurysm of the ascending aorta, without rupture: Secondary | ICD-10-CM

## 2020-12-10 DIAGNOSIS — I712 Thoracic aortic aneurysm, without rupture, unspecified: Secondary | ICD-10-CM

## 2020-12-11 DIAGNOSIS — M532X7 Spinal instabilities, lumbosacral region: Secondary | ICD-10-CM | POA: Diagnosis not present

## 2020-12-13 DIAGNOSIS — M532X7 Spinal instabilities, lumbosacral region: Secondary | ICD-10-CM | POA: Diagnosis not present

## 2020-12-14 ENCOUNTER — Other Ambulatory Visit: Payer: Self-pay

## 2020-12-14 ENCOUNTER — Ambulatory Visit (INDEPENDENT_AMBULATORY_CARE_PROVIDER_SITE_OTHER): Payer: Medicare Other | Admitting: Internal Medicine

## 2020-12-14 ENCOUNTER — Encounter: Payer: Self-pay | Admitting: Internal Medicine

## 2020-12-14 VITALS — BP 110/64 | HR 84 | Temp 99.0°F | Ht 60.5 in | Wt 129.0 lb

## 2020-12-14 DIAGNOSIS — E559 Vitamin D deficiency, unspecified: Secondary | ICD-10-CM

## 2020-12-14 DIAGNOSIS — Z23 Encounter for immunization: Secondary | ICD-10-CM | POA: Diagnosis not present

## 2020-12-14 DIAGNOSIS — N644 Mastodynia: Secondary | ICD-10-CM

## 2020-12-14 DIAGNOSIS — Z0000 Encounter for general adult medical examination without abnormal findings: Secondary | ICD-10-CM | POA: Diagnosis not present

## 2020-12-14 DIAGNOSIS — J410 Simple chronic bronchitis: Secondary | ICD-10-CM

## 2020-12-14 DIAGNOSIS — I10 Essential (primary) hypertension: Secondary | ICD-10-CM | POA: Diagnosis not present

## 2020-12-14 DIAGNOSIS — E785 Hyperlipidemia, unspecified: Secondary | ICD-10-CM

## 2020-12-14 DIAGNOSIS — E039 Hypothyroidism, unspecified: Secondary | ICD-10-CM | POA: Diagnosis not present

## 2020-12-14 DIAGNOSIS — I700 Atherosclerosis of aorta: Secondary | ICD-10-CM

## 2020-12-14 DIAGNOSIS — Z1211 Encounter for screening for malignant neoplasm of colon: Secondary | ICD-10-CM | POA: Diagnosis not present

## 2020-12-14 NOTE — Assessment & Plan Note (Signed)
Declines statins due to joint pain and checking lipid panel today.

## 2020-12-14 NOTE — Assessment & Plan Note (Signed)
Advised to quit however she feels unable to make attempt at this time.

## 2020-12-14 NOTE — Assessment & Plan Note (Signed)
BP at goal on losartan 100 mg daily and hctz 25 mg daily. Checking CMP and adjust as needed.

## 2020-12-14 NOTE — Progress Notes (Signed)
Subjective:   Patient ID: Rachel Gardner, female    DOB: January 21, 1946, 74 y.o.   MRN: [id number]  HPI Here for medicare wellness and physical, no new complaints. Please see A/P for status and treatment of chronic medical problems.   Diet: heart healthy Physical activity: sedentary Depression/mood screen: negative Hearing: intact to whispered voice Visual acuity: grossly normal with lens, performs annual eye exam  ADLs: capable Fall risk: none Home safety: good Cognitive evaluation: intact to orientation, naming, recall and repetition EOL planning: adv directives discussed  [place] Visit from 12/14/2020 in [place] at [facility]  PHQ-2 Total Score [place] Office Visit from 12/14/2020 in [place] at [facility]  PHQ-9 Total Score 0     I have personally reviewed and have noted 1. The patient's medical and social history - reviewed today no changes 2. Their use of alcohol, tobacco or illicit drugs 3. Their current medications and supplements 4. The patient's functional ability including ADL's, fall risks, home safety risks and hearing or visual impairment. 5. Diet and physical activities 6. Evidence for depression or mood disorders 7. Care team reviewed and updated  Patient Care Team: Hoyt Koch, MD as PCP - General (Internal Medicine) Jerline Pain, MD as PCP - Cardiology (Cardiology) Lafayette Dragon, MD (Inactive) as Consulting Physician (Gastroenterology) Suella Broad, MD as Consulting Physician (Physical Medicine and Rehabilitation) Estevan Ryder, MD as Referring Physician (Cardiology) Renato Shin, MD as Consulting Physician (Endocrinology) Vicie Mutters, MD as Consulting Physician (Otolaryngology) Past Medical History:  Diagnosis Date  . Allergy    hay fever  . Ascending aortic aneurysm ([place])   . Blood in stool   . BPPV (benign paroxysmal positional vertigo)   . CPAP (continuous positive airway  pressure) dependence 02/09/2019  . Diverticulitis   . Fatty liver 05/28/10   per CT at [facility]  . Genital herpes   . GERD (gastroesophageal reflux disease)   . Glaucoma   . Helicobacter pylori infection   . Hiatal hernia 05/28/10   CT @ [facility]  . Hyperlipidemia   . Hypertension   . Hypothyroidism   . 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 Kidney stones   . Lung nodule seen on imaging study   . Meniere's disease   . Obstructive sleep apnea   . OSA on CPAP 04/13/2019   Moderate with AHI 20/hr now on CPAP at 13cm H2O   Past Surgical History:  Procedure Laterality Date  . ABDOMINAL HYSTERECTOMY  1995  . ANAL RECTAL MANOMETRY N/A 08/06/2016   Procedure: ANO RECTAL MANOMETRY;  Surgeon: Doran Stabler, MD;  Location: [facility];  Service: Gastroenterology;  Laterality: N/A;  . APPENDECTOMY  1989  . BPPV     x 4 after L cochlear implant  . BREAST SURGERY  1989   breast reduction  . CHOLECYSTECTOMY  1989  . COCHLEAR IMPLANT     Left/2009; Right/2012  . LAPAROSCOPIC SIGMOID COLECTOMY  2012   with takedown of splenic flexure; cystoscopy with bilateral ureteral stent placement  . REDUCTION MAMMAPLASTY Bilateral 1989   [place]  . TOTAL THYROIDECTOMY  1995   Family History  Problem Relation Age of Onset  . Hypertension Mother   . Hypertension Father   . Hypertension Sister   . Prostate cancer Brother   . Hyperlipidemia Brother   . Colon cancer Maternal Aunt   . Prostate cancer Paternal Uncle   .  Colon cancer Cousin        x 2; paternal  . Thyroid disease Sister    Review of Systems  Constitutional: Negative.   HENT: Negative.   Eyes: Negative.   Respiratory: Negative for cough, chest tightness and shortness of breath.   Cardiovascular: Negative for chest pain, palpitations and leg swelling.  Gastrointestinal: Positive for diarrhea. Negative for abdominal distention, abdominal pain, constipation, nausea and vomiting.  Musculoskeletal: Negative.    Skin: Negative.   Neurological: Negative.   Psychiatric/Behavioral: Negative.     Objective:  Physical Exam Constitutional:      Appearance: She is well-developed and well-nourished.  HENT:     Head: Normocephalic and atraumatic.  Eyes:     Extraocular Movements: EOM normal.  Neck:     Comments: Scar from prior thyroid surgery Cardiovascular:     Rate and Rhythm: Normal rate and regular rhythm.  Pulmonary:     Effort: Pulmonary effort is normal. No respiratory distress.     Breath sounds: Normal breath sounds. No wheezing or rales.  Abdominal:     General: Bowel sounds are normal. There is no distension.     Palpations: Abdomen is soft.     Tenderness: There is no abdominal tenderness. There is no rebound.  Musculoskeletal:        General: No edema.     Cervical back: Normal range of motion.  Skin:    General: Skin is warm and dry.  Neurological:     Mental Status: She is alert and oriented to person, place, and time.     Coordination: Coordination normal.  Psychiatric:        Mood and Affect: Mood and affect normal.     Vitals:   12/14/20 1101  BP: 110/64  Pulse: 84  Temp: 99 F (37.2 C)  Weight: 129 lb (58.5 kg)  Height: 5' 0.5" (1.537 m)   This visit occurred during the SARS-CoV-2 public health emergency.  Safety protocols were in place, including screening questions prior to the visit, additional usage of staff PPE, and extensive cleaning of exam room while observing appropriate contact time as indicated for disinfecting solutions.   Assessment & Plan:  Flu shot given at visit

## 2020-12-14 NOTE — Patient Instructions (Addendum)
Health Maintenance, Female Adopting a healthy lifestyle and getting preventive care are important in promoting health and wellness. Ask your health care provider about:  The right schedule for you to have regular tests and exams.  Things you can do on your own to prevent diseases and keep yourself healthy. What should I know about diet, weight, and exercise? Eat a healthy diet   Eat a diet that includes plenty of vegetables, fruits, low-fat dairy products, and lean protein.  Do not eat a lot of foods that are high in solid fats, added sugars, or sodium. Maintain a healthy weight Body mass index (BMI) is used to identify weight problems. It estimates body fat based on height and weight. Your health care provider can help determine your BMI and help you achieve or maintain a healthy weight. Get regular exercise Get regular exercise. This is one of the most important things you can do for your health. Most adults should:  Exercise for at least 150 minutes each week. The exercise should increase your heart rate and make you sweat (moderate-intensity exercise).  Do strengthening exercises at least twice a week. This is in addition to the moderate-intensity exercise.  Spend less time sitting. Even light physical activity can be beneficial. Watch cholesterol and blood lipids Have your blood tested for lipids and cholesterol at 73 years of age, then have this test every 5 years. Have your cholesterol levels checked more often if:  Your lipid or cholesterol levels are high.  You are older than 73 years of age.  You are at high risk for heart disease. What should I know about cancer screening? Depending on your health history and family history, you may need to have cancer screening at various ages. This may include screening for:  Breast cancer.  Cervical cancer.  Colorectal cancer.  Skin cancer.  Lung cancer. What should I know about heart disease, diabetes, and high blood  pressure? Blood pressure and heart disease  High blood pressure causes heart disease and increases the risk of stroke. This is more likely to develop in people who have high blood pressure readings, are of African descent, or are overweight.  Have your blood pressure checked: ? Every 3-5 years if you are 18-39 years of age. ? Every year if you are 40 years old or older. Diabetes Have regular diabetes screenings. This checks your fasting blood sugar level. Have the screening done:  Once every three years after age 40 if you are at a normal weight and have a low risk for diabetes.  More often and at a younger age if you are overweight or have a high risk for diabetes. What should I know about preventing infection? Hepatitis B If you have a higher risk for hepatitis B, you should be screened for this virus. Talk with your health care provider to find out if you are at risk for hepatitis B infection. Hepatitis C Testing is recommended for:  Everyone born from 1945 through 1965.  Anyone with known risk factors for hepatitis C. Sexually transmitted infections (STIs)  Get screened for STIs, including gonorrhea and chlamydia, if: ? You are sexually active and are younger than 73 years of age. ? You are older than 73 years of age and your health care provider tells you that you are at risk for this type of infection. ? Your sexual activity has changed since you were last screened, and you are at increased risk for chlamydia or gonorrhea. Ask your health care provider if   you are at risk.  Ask your health care provider about whether you are at high risk for HIV. Your health care provider may recommend a prescription medicine to help prevent HIV infection. If you choose to take medicine to prevent HIV, you should first get tested for HIV. You should then be tested every 3 months for as long as you are taking the medicine. Pregnancy  If you are about to stop having your period (premenopausal) and  you may become pregnant, seek counseling before you get pregnant.  Take 400 to 800 micrograms (mcg) of folic acid every day if you become pregnant.  Ask for birth control (contraception) if you want to prevent pregnancy. Osteoporosis and menopause Osteoporosis is a disease in which the bones lose minerals and strength with aging. This can result in bone fractures. If you are 65 years old or older, or if you are at risk for osteoporosis and fractures, ask your health care provider if you should:  Be screened for bone loss.  Take a calcium or vitamin D supplement to lower your risk of fractures.  Be given hormone replacement therapy (HRT) to treat symptoms of menopause. Follow these instructions at home: Lifestyle  Do not use any products that contain nicotine or tobacco, such as cigarettes, e-cigarettes, and chewing tobacco. If you need help quitting, ask your health care provider.  Do not use street drugs.  Do not share needles.  Ask your health care provider for help if you need support or information about quitting drugs. Alcohol use  Do not drink alcohol if: ? Your health care provider tells you not to drink. ? You are pregnant, may be pregnant, or are planning to become pregnant.  If you drink alcohol: ? Limit how much you use to 0-1 drink a day. ? Limit intake if you are breastfeeding.  Be aware of how much alcohol is in your drink. In the [place] one drink equals one 12 oz bottle of beer (355 mL), one 5 oz glass of wine (148 mL), or one 1 oz glass of hard liquor (44 mL). General instructions  Schedule regular health, dental, and eye exams.  Stay current with your vaccines.  Tell your health care provider if: ? You often feel depressed. ? You have ever been abused or do not feel safe at home. Summary  Adopting a healthy lifestyle and getting preventive care are important in promoting health and wellness.  Follow your health care provider's instructions about healthy  diet, exercising, and getting tested or screened for diseases.  Follow your health care provider's instructions on monitoring your cholesterol and blood pressure. This information is not intended to replace advice given to you by your health care provider. Make sure you discuss any questions you have with your health care provider. Document Revised: 12/08/2018 Document Reviewed: 12/08/2018 Elsevier Patient Education  2020 Elsevier Inc.  

## 2020-12-14 NOTE — Assessment & Plan Note (Signed)
Declines statins due to muscle ache. Checking lipid panel.

## 2020-12-14 NOTE — Assessment & Plan Note (Signed)
Flu shot given at visit. Covid-19 up to date including booster. Pneumonia complete. Shingrix counseled. Tetanus due 2025. Colonoscopy due 2022 GI referral placed. Mammogram ordered, pap smear aged out and dexa declines further. Counseled about sun safety and mole surveillance. Counseled about the dangers of distracted driving. Given 10 year screening recommendations.

## 2020-12-14 NOTE — Assessment & Plan Note (Addendum)
Checking TSH and free T4 and adjust as needed synthroid 150 mcg daily.

## 2020-12-17 ENCOUNTER — Other Ambulatory Visit (INDEPENDENT_AMBULATORY_CARE_PROVIDER_SITE_OTHER): Payer: Medicare Other

## 2020-12-17 DIAGNOSIS — I10 Essential (primary) hypertension: Secondary | ICD-10-CM

## 2020-12-17 DIAGNOSIS — E039 Hypothyroidism, unspecified: Secondary | ICD-10-CM | POA: Diagnosis not present

## 2020-12-17 DIAGNOSIS — E559 Vitamin D deficiency, unspecified: Secondary | ICD-10-CM | POA: Diagnosis not present

## 2020-12-17 LAB — COMPREHENSIVE METABOLIC PANEL
ALT: 13 U/L (ref 0–35)
AST: 15 U/L (ref 0–37)
Albumin: 4 g/dL (ref 3.5–5.2)
Alkaline Phosphatase: 81 U/L (ref 39–117)
BUN: 25 mg/dL — ABNORMAL HIGH (ref 6–23)
CO2: 28 mEq/L (ref 19–32)
Calcium: 9.6 mg/dL (ref 8.4–10.5)
Chloride: 104 mEq/L (ref 96–112)
Creatinine, Ser: 0.91 mg/dL (ref 0.40–1.20)
GFR: 62.47 mL/min (ref >60.00)
Glucose, Bld: 93 mg/dL (ref 70–99)
Potassium: 3.5 mEq/L (ref 3.5–5.1)
Sodium: 139 mEq/L (ref 135–145)
Total Bilirubin: 0.4 mg/dL (ref 0.2–1.2)
Total Protein: 7.5 g/dL (ref 6.0–8.3)

## 2020-12-17 LAB — LIPID PANEL
Cholesterol: 216 mg/dL — ABNORMAL HIGH (ref 0–200)
HDL: 39 mg/dL — ABNORMAL LOW (ref >39.00)
LDL Cholesterol: 138 mg/dL — ABNORMAL HIGH (ref 0–99)
NonHDL: 176.64
Total CHOL/HDL Ratio: 6
Triglycerides: 191 mg/dL — ABNORMAL HIGH (ref 0.0–149.0)
VLDL: 38.2 mg/dL (ref 0.0–40.0)

## 2020-12-17 LAB — CBC
HCT: 45.2 % (ref 36.0–46.0)
Hemoglobin: 15.1 g/dL — ABNORMAL HIGH (ref 12.0–15.0)
MCHC: 33.4 g/dL (ref 30.0–36.0)
MCV: 86 fl (ref 78.0–100.0)
Platelets: 189 10*3/uL (ref 150.0–400.0)
RBC: 5.26 Mil/uL — ABNORMAL HIGH (ref 3.87–5.11)
RDW: 12.7 % (ref 11.5–15.5)
WBC: 9.5 10*3/uL (ref 4.0–10.5)

## 2020-12-17 LAB — HEMOGLOBIN A1C: Hgb A1c MFr Bld: 5.8 % (ref 4.6–6.5)

## 2020-12-17 LAB — VITAMIN D 25 HYDROXY (VIT D DEFICIENCY, FRACTURES): VITD: 69.54 ng/mL (ref 30.00–100.00)

## 2020-12-17 LAB — T4, FREE: Free T4: 1.17 ng/dL (ref 0.60–1.60)

## 2020-12-17 LAB — TSH: TSH: 0.06 u[IU]/mL — ABNORMAL LOW (ref 0.35–4.50)

## 2020-12-20 ENCOUNTER — Ambulatory Visit
Admission: RE | Admit: 2020-12-20 | Discharge: 2020-12-20 | Disposition: A | Discharge status: Home / Self Care | Payer: Medicare Other | Source: Ambulatory Visit | Attending: Internal Medicine | Admitting: Internal Medicine

## 2020-12-20 ENCOUNTER — Other Ambulatory Visit: Payer: Self-pay

## 2020-12-20 DIAGNOSIS — N644 Mastodynia: Secondary | ICD-10-CM | POA: Diagnosis not present

## 2021-01-02 ENCOUNTER — Encounter: Payer: Self-pay | Admitting: Internal Medicine

## 2021-01-04 DIAGNOSIS — Z961 Presence of intraocular lens: Secondary | ICD-10-CM | POA: Diagnosis not present

## 2021-01-04 DIAGNOSIS — H26493 Other secondary cataract, bilateral: Secondary | ICD-10-CM | POA: Diagnosis not present

## 2021-01-11 DIAGNOSIS — J310 Chronic rhinitis: Secondary | ICD-10-CM | POA: Diagnosis not present

## 2021-01-11 DIAGNOSIS — H903 Sensorineural hearing loss, bilateral: Secondary | ICD-10-CM | POA: Diagnosis not present

## 2021-01-11 DIAGNOSIS — J343 Hypertrophy of nasal turbinates: Secondary | ICD-10-CM | POA: Diagnosis not present

## 2021-01-16 ENCOUNTER — Encounter: Payer: Self-pay | Admitting: Surgery

## 2021-01-16 ENCOUNTER — Ambulatory Visit: Payer: Medicare Other | Admitting: Surgery

## 2021-01-16 ENCOUNTER — Ambulatory Visit
Admission: RE | Admit: 2021-01-16 | Discharge: 2021-01-16 | Disposition: A | Discharge status: Home / Self Care | Payer: Medicare Other | Source: Ambulatory Visit | Attending: Surgery | Admitting: Surgery

## 2021-01-16 ENCOUNTER — Other Ambulatory Visit: Payer: Self-pay

## 2021-01-16 VITALS — BP 120/85 | HR 90 | Temp 97.6°F | Resp 20 | Ht 61.0 in | Wt 127.0 lb

## 2021-01-16 DIAGNOSIS — I712 Thoracic aortic aneurysm, without rupture, unspecified: Secondary | ICD-10-CM

## 2021-01-16 DIAGNOSIS — J984 Other disorders of lung: Secondary | ICD-10-CM | POA: Diagnosis not present

## 2021-01-16 DIAGNOSIS — I700 Atherosclerosis of aorta: Secondary | ICD-10-CM | POA: Diagnosis not present

## 2021-01-16 DIAGNOSIS — I2510 Atherosclerotic heart disease of native coronary artery without angina pectoris: Secondary | ICD-10-CM | POA: Diagnosis not present

## 2021-01-16 NOTE — Progress Notes (Signed)
HPI:  The patient is a 75 year old woman who returns for follow-up of a 4.1 cm fusiform ascending aortic aneurysm as well as a 9 mm left lower lobe pulmonary nodule.  Since I saw her 1 year ago she reports having some problems with incontinence and was diagnosed with lumbar spine instability with nerve impingement.  She said that her symptoms have markedly improved with physical therapy.  Current Outpatient Medications  Medication Sig Dispense Refill  . ascorbic acid (VITAMIN C) 1000 MG tablet Take 500 mg by mouth daily.     Marland Kitchen aspirin 81 MG chewable tablet Chew 81 mg by mouth daily.     . bimatoprost (LUMIGAN) 0.01 % SOLN Place 1 drop into both eyes at bedtime.    . Calcium Carb-Cholecalciferol 500-600 MG-UNIT TABS Take 1 Dose by mouth daily.     . fluticasone (FLONASE) 50 MCG/ACT nasal spray Place 2 sprays into both nostrils at bedtime.    . hydrochlorothiazide (HYDRODIURIL) 25 MG tablet Take 1 tablet (25 mg total) by mouth daily. 90 tablet 3  . levothyroxine (SYNTHROID) 150 MCG tablet Take 1 tablet (150 mcg total) by mouth daily before breakfast. 90 tablet 3  . losartan (COZAAR) 100 MG tablet Take 1 tablet (100 mg total) by mouth daily. 90 tablet 3  . meclizine (ANTIVERT) 25 MG tablet Take 1 tablet (25 mg total) by mouth 3 (three) times daily as needed for dizziness. 90 tablet 9  . Omega-3 Fatty Acids (FISH OIL PO) Take 1 tablet by mouth daily.    . pantoprazole (PROTONIX) 40 MG tablet TAKE 1 TABLET(40 MG) BY MOUTH TWICE DAILY 180 tablet 1  . Probiotic Product (PROBIOTIC ADVANCED PO) Take 1 capsule by mouth daily.     No current facility-administered medications for this visit.     Physical Exam: BP 120/85   Pulse 90   Temp 97.6 F (36.4 C) (Skin)   Resp 20   Ht 5\' 1"  (1.549 m)   Wt 127 lb (57.6 kg)   SpO2 96% Comment: RA  BMI 24.00 kg/m  She looks well. Cardiac exam shows a regular rate and rhythm with normal heart sounds.  There is no murmur. Lungs are  clear.  Diagnostic Tests:  CLINICAL DATA:  75 year old female with history of thoracic aortic aneurysm. Follow-up study.  EXAM: CT CHEST WITHOUT CONTRAST  TECHNIQUE: Multidetector CT imaging of the chest was performed following the standard protocol without IV contrast.  COMPARISON:  Chest CT 01/25/2020.  FINDINGS: Cardiovascular: Heart size is normal. There is no significant pericardial fluid, thickening or pericardial calcification. There is aortic atherosclerosis, as well as atherosclerosis of the great vessels of the mediastinum and the coronary arteries, including calcified atherosclerotic plaque in the left main, left anterior descending, left circumflex and right coronary arteries. Ectasia of ascending thoracic aorta (4.2 cm in diameter), similar to the prior study.  Mediastinum/Nodes: No pathologically enlarged mediastinal or hilar lymph nodes. Please note that accurate exclusion of hilar adenopathy is limited on noncontrast CT scans. Esophagus is unremarkable in appearance. No axillary lymphadenopathy.  Lungs/Pleura: 9 mm smoothly marginated subpleural pulmonary nodule in the periphery of the left lower lobe (axial image 65 of series 8), stable compared to prior examinations dating back to at least December 31, 2017, considered definitively benign. No other suspicious appearing pulmonary nodules or masses are noted. Mild linear scarring in the lung bases bilaterally. No acute consolidative airspace disease. No pleural effusions.  Upper Abdomen: Aortic atherosclerosis. Colonic diverticulosis noted in  the region of the splenic flexure.  Musculoskeletal: There are no aggressive appearing lytic or blastic lesions noted in the visualized portions of the skeleton.  IMPRESSION: 1. Aortic atherosclerosis, in addition to left main and 3 vessel coronary artery disease, as well as similar ectasia of ascending thoracic aorta (4.2 cm in diameter). Recommend annual  imaging followup by CTA or MRA. This recommendation follows 2010 ACCF/AHA/AATS/ACR/ASA/SCA/SCAI/SIR/STS/SVM Guidelines for the Diagnosis and Management of Patients with Thoracic Aortic Disease. Circulation. 2010; 121: F749-S496. Aortic aneurysm NOS (ICD10-I71.9) 2. 9 mm smoothly marginated left lower lobe pulmonary nodule, stable compared to prior examinations, considered definitively benign. 3. Colonic diverticulosis.  Aortic Atherosclerosis (ICD10-I70.0).   Electronically Signed   By: Vinnie Langton M.D.   On: 01/16/2021 11:50   Impression:  She has a stable 4.2 cm fusiform ascending aortic aneurysm that is well below the surgical threshold of 5.5 cm.  The 9 mm nodule in the left lower lobe remained stable and is considered benign.  I reviewed the CT images with her and answered her questions.  I have recommended a follow-up CT of the chest without contrast in 1 year for surveillance of her ascending aortic aneurysm.  Plan:  She will return to see me in 1 year with a CT scan of the chest without contrast.  I spent 15 minutes performing this established patient evaluation and > 50% of this time was spent face to face counseling and coordinating the care of this patient's aortic aneurysm.    Gaye Pollack, MD [facility] [phone]

## 2021-01-17 ENCOUNTER — Ambulatory Visit (INDEPENDENT_AMBULATORY_CARE_PROVIDER_SITE_OTHER): Payer: Medicare Other | Admitting: Endocrinology

## 2021-01-17 VITALS — BP 122/74 | HR 106 | Ht 61.0 in | Wt 129.6 lb

## 2021-01-17 DIAGNOSIS — E039 Hypothyroidism, unspecified: Secondary | ICD-10-CM

## 2021-01-17 LAB — T4, FREE: Free T4: 1.25 ng/dL (ref 0.60–1.60)

## 2021-01-17 LAB — TSH: TSH: 0.04 u[IU]/mL — ABNORMAL LOW (ref 0.35–4.50)

## 2021-01-17 MED ORDER — LEVOTHYROXINE SODIUM 137 MCG PO TABS: 137.0000 ug | ORAL_TABLET | Freq: Every day | ORAL | 3 refills | Status: DC

## 2021-01-17 NOTE — Progress Notes (Signed)
Subjective:    Patient ID: Rachel Gardner, female    DOB: 02/22/1946, 75 y.o.   MRN: [id number]  HPI Pt returns for f/u of postsurgical hypothyroidism (she had thyroidectomy in 2005, for a goiter--was benign on pathology.  she has been on prescribed thyroid hormone therapy since then; synthroid dosage has varied from 125 to 200 mcg/d).  pt states she feels well in general.  Specifically, she denies palpitations and tremor.  She says she never misses the synthroid.   Past Medical History:  Diagnosis Date  . Allergy    hay fever  . Ascending aortic aneurysm ([place])   . Blood in stool   . BPPV (benign paroxysmal positional vertigo)   . CPAP (continuous positive airway pressure) dependence 02/09/2019  . Diverticulitis   . Fatty liver 05/28/10   per CT at [facility]  . Genital herpes   . GERD (gastroesophageal reflux disease)   . Glaucoma   . Helicobacter pylori infection   . Hiatal hernia 05/28/10   CT @ [facility]  . Hyperlipidemia   . Hypertension   . Hypothyroidism   . 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 Kidney stones   . Lung nodule seen on imaging study   . Meniere's disease   . Obstructive sleep apnea   . OSA on CPAP 04/13/2019   Moderate with AHI 20/hr now on CPAP at 13cm H2O    Past Surgical History:  Procedure Laterality Date  . ABDOMINAL HYSTERECTOMY  1995  . ANAL RECTAL MANOMETRY N/A 08/06/2016   Procedure: ANO RECTAL MANOMETRY;  Surgeon: Doran Stabler, MD;  Location: [facility];  Service: Gastroenterology;  Laterality: N/A;  . APPENDECTOMY  1989  . BPPV     x 4 after L cochlear implant  . BREAST SURGERY  1989   breast reduction  . CHOLECYSTECTOMY  1989  . COCHLEAR IMPLANT     Left/2009; Right/2012  . LAPAROSCOPIC SIGMOID COLECTOMY  2012   with takedown of splenic flexure; cystoscopy with bilateral ureteral stent placement  . REDUCTION MAMMAPLASTY Bilateral 1989   [place]  . TOTAL THYROIDECTOMY  1995    Social History    Socioeconomic History  . Marital status: Divorced    Spouse name: Not on file  . Number of children: 3  . Years of education: Not on file  . Highest education level: Not on file  Occupational History  . Occupation: retired Marine scientist  Tobacco Use  . Smoking status: Current Every Day Smoker    Packs/day: 0.50    Years: 41.00    Pack years: 20.50    Types: Cigarettes    Last attempt to quit: 01/18/2016    Years since quitting: 5.0  . Smokeless tobacco: Never Used  Vaping Use  . Vaping Use: Never used  Substance and Sexual Activity  . Alcohol use: Yes    Alcohol/week: 0.0 standard drinks    Comment: rarely  . Drug use: No  . Sexual activity: Never  Other Topics Concern  . Not on file  Social History Narrative   Regular exercise: walk; 1 mile a day / walk 5 miles weekend;    update 02/22/2020 not exercising   Caffeine use: 1-2 cups of coffee per day   3 children   Lives alone with her dogs, son lives next door   Social Determinants of Health   Financial Resource Strain: Not on file  Food Insecurity: Not on file  Transportation Needs: Not on  file  Physical Activity: Not on file  Stress: Not on file  Social Connections: Not on file  Intimate Partner Violence: Not on file    Current Outpatient Medications on File Prior to Visit  Medication Sig Dispense Refill  . ascorbic acid (VITAMIN C) 1000 MG tablet Take 500 mg by mouth daily.     Marland Kitchen aspirin 81 MG chewable tablet Chew 81 mg by mouth daily.     . bimatoprost (LUMIGAN) 0.01 % SOLN Place 1 drop into both eyes at bedtime.    . Calcium Carb-Cholecalciferol 500-600 MG-UNIT TABS Take 1 Dose by mouth daily.     . fluticasone (FLONASE) 50 MCG/ACT nasal spray Place 2 sprays into both nostrils at bedtime.    . hydrochlorothiazide (HYDRODIURIL) 25 MG tablet Take 1 tablet (25 mg total) by mouth daily. 90 tablet 3  . losartan (COZAAR) 100 MG tablet Take 1 tablet (100 mg total) by mouth daily. 90 tablet 3  . meclizine (ANTIVERT) 25 MG  tablet Take 1 tablet (25 mg total) by mouth 3 (three) times daily as needed for dizziness. 90 tablet 9  . Omega-3 Fatty Acids (FISH OIL PO) Take 1 tablet by mouth daily.    . pantoprazole (PROTONIX) 40 MG tablet TAKE 1 TABLET(40 MG) BY MOUTH TWICE DAILY 180 tablet 1  . Probiotic Product (PROBIOTIC ADVANCED PO) Take 1 capsule by mouth daily.     No current facility-administered medications on file prior to visit.    Allergies  Allergen Reactions  . Morphine Nausea And Vomiting  . Morphine And Related     Nausea Can tolerate Dilaudid  . Statins Other (See Comments)    Joint pain   . Tape Other (See Comments)    Causes redness and will take skin when removed  . Wellbutrin [Bupropion] Other (See Comments)    Suicidal intensions  . Denture Adhesive Rash    Family History  Problem Relation Age of Onset  . Hypertension Mother   . Hypertension Father   . Hypertension Sister   . Prostate cancer Brother   . Hyperlipidemia Brother   . Colon cancer Maternal Aunt   . Prostate cancer Paternal Uncle   . Colon cancer Cousin        x 2; paternal  . Thyroid disease Sister     BP 122/74 (BP Location: Right Arm, Patient Position: Sitting, Cuff Size: Normal)   Pulse (!) 106   Ht 5\' 1"  (1.549 m)   Wt 129 lb 9.6 oz (58.8 kg)   SpO2 97%   BMI 24.49 kg/m    Review of Systems     Objective:   Physical Exam VITAL SIGNS:  See vs page GENERAL: no distress Neck: a healed scar is present.  I do not appreciate a nodule in the thyroid or elsewhere in the neck.      Lab Results  Component Value Date   TSH 0.04 (L) 01/17/2021      Assessment & Plan:  Hypothyroidism: overcontrolled.  I have sent a prescription to your pharmacy, to reduce synthroid

## 2021-01-17 NOTE — Patient Instructions (Signed)
I have sent a prescription to your pharmacy, to reduce the levothyroxine. Please redo the blood test in about 1 month.   Please come back for a follow-up appointment in 1 year.

## 2021-01-24 ENCOUNTER — Encounter: Payer: Self-pay | Admitting: Internal Medicine

## 2021-01-24 DIAGNOSIS — R202 Paresthesia of skin: Secondary | ICD-10-CM

## 2021-01-24 DIAGNOSIS — H903 Sensorineural hearing loss, bilateral: Secondary | ICD-10-CM | POA: Diagnosis not present

## 2021-01-29 DIAGNOSIS — H26491 Other secondary cataract, right eye: Secondary | ICD-10-CM | POA: Diagnosis not present

## 2021-01-29 DIAGNOSIS — H26493 Other secondary cataract, bilateral: Secondary | ICD-10-CM | POA: Diagnosis not present

## 2021-01-29 DIAGNOSIS — H26492 Other secondary cataract, left eye: Secondary | ICD-10-CM | POA: Diagnosis not present

## 2021-02-01 ENCOUNTER — Other Ambulatory Visit (INDEPENDENT_AMBULATORY_CARE_PROVIDER_SITE_OTHER): Payer: Medicare Other

## 2021-02-01 ENCOUNTER — Other Ambulatory Visit: Payer: Self-pay

## 2021-02-01 DIAGNOSIS — H903 Sensorineural hearing loss, bilateral: Secondary | ICD-10-CM | POA: Diagnosis not present

## 2021-02-01 DIAGNOSIS — R202 Paresthesia of skin: Secondary | ICD-10-CM | POA: Diagnosis not present

## 2021-02-01 DIAGNOSIS — Z9621 Cochlear implant status: Secondary | ICD-10-CM | POA: Diagnosis not present

## 2021-02-01 DIAGNOSIS — Z45321 Encounter for adjustment and management of cochlear device: Secondary | ICD-10-CM | POA: Diagnosis not present

## 2021-02-01 LAB — VITAMIN D 25 HYDROXY (VIT D DEFICIENCY, FRACTURES): VITD: 72.06 ng/mL (ref 30.00–100.00)

## 2021-02-01 LAB — VITAMIN B12: Vitamin B-12: 305 pg/mL (ref 211–911)

## 2021-02-05 ENCOUNTER — Ambulatory Visit: Payer: Medicare Other | Admitting: Endocrinology

## 2021-03-01 ENCOUNTER — Encounter: Payer: Medicare Other | Admitting: Gastroenterology

## 2021-03-11 ENCOUNTER — Encounter: Payer: Self-pay | Admitting: Gastroenterology

## 2021-03-11 ENCOUNTER — Other Ambulatory Visit: Payer: Self-pay

## 2021-03-11 ENCOUNTER — Ambulatory Visit (AMBULATORY_SURGERY_CENTER): Payer: Self-pay

## 2021-03-11 VITALS — Ht 61.0 in | Wt 130.0 lb

## 2021-03-11 DIAGNOSIS — Z1211 Encounter for screening for malignant neoplasm of colon: Secondary | ICD-10-CM

## 2021-03-11 MED ORDER — GOLYTELY 236 G PO SOLR: 4000.0000 mL | ORAL | 0 refills | Status: DC

## 2021-03-11 NOTE — Progress Notes (Signed)
No egg or soy allergy known to patient  No issues with past sedation with any surgeries or procedures No intubation problems in the past  No FH of Malignant Hyperthermia No diet pills per patient No home 02 use per patient  No blood thinners per patient  Pt denies issues with constipation  No A fib or A flutter  EMMI video via [place] 19 guidelines implemented in PV today with Pt and RN  Pt is fully vaccinated for Covid + booster Coupon given to pt in PV today , Code to Pharmacy and  NO PA's for preps discussed with pt in PV today  Discussed with pt there will be an out-of-pocket cost for prep and that varies from $0 to 70 dollars  Due to the COVID-19 pandemic we are asking patients to follow certain guidelines. Pt aware of COVID protocols and [facility] guidelines

## 2021-03-13 DIAGNOSIS — Z45321 Encounter for adjustment and management of cochlear device: Secondary | ICD-10-CM | POA: Diagnosis not present

## 2021-03-13 DIAGNOSIS — Z9621 Cochlear implant status: Secondary | ICD-10-CM | POA: Diagnosis not present

## 2021-03-13 DIAGNOSIS — H903 Sensorineural hearing loss, bilateral: Secondary | ICD-10-CM | POA: Diagnosis not present

## 2021-03-14 ENCOUNTER — Other Ambulatory Visit (INDEPENDENT_AMBULATORY_CARE_PROVIDER_SITE_OTHER): Payer: Medicare Other

## 2021-03-14 ENCOUNTER — Other Ambulatory Visit: Payer: Self-pay

## 2021-03-14 ENCOUNTER — Other Ambulatory Visit: Payer: Self-pay | Admitting: Endocrinology

## 2021-03-14 DIAGNOSIS — E039 Hypothyroidism, unspecified: Secondary | ICD-10-CM

## 2021-03-14 LAB — T4, FREE: Free T4: 1.22 ng/dL (ref 0.60–1.60)

## 2021-03-14 LAB — TSH: TSH: 0.03 u[IU]/mL — ABNORMAL LOW (ref 0.35–4.50)

## 2021-03-14 MED ORDER — LEVOTHYROXINE SODIUM 112 MCG PO TABS: 112.0000 ug | ORAL_TABLET | Freq: Every day | ORAL | 3 refills | Status: DC

## 2021-03-18 ENCOUNTER — Encounter: Payer: Self-pay | Admitting: Gastroenterology

## 2021-03-18 ENCOUNTER — Other Ambulatory Visit: Payer: Self-pay

## 2021-03-18 ENCOUNTER — Ambulatory Visit (AMBULATORY_SURGERY_CENTER): Payer: Medicare Other | Admitting: Gastroenterology

## 2021-03-18 VITALS — BP 115/69 | HR 71 | Temp 97.1°F | Resp 17 | Ht 61.0 in | Wt 130.0 lb

## 2021-03-18 DIAGNOSIS — R151 Fecal smearing: Secondary | ICD-10-CM | POA: Diagnosis not present

## 2021-03-18 DIAGNOSIS — G4733 Obstructive sleep apnea (adult) (pediatric): Secondary | ICD-10-CM | POA: Diagnosis not present

## 2021-03-18 DIAGNOSIS — D123 Benign neoplasm of transverse colon: Secondary | ICD-10-CM

## 2021-03-18 DIAGNOSIS — Z1211 Encounter for screening for malignant neoplasm of colon: Secondary | ICD-10-CM

## 2021-03-18 DIAGNOSIS — D122 Benign neoplasm of ascending colon: Secondary | ICD-10-CM

## 2021-03-18 DIAGNOSIS — K219 Gastro-esophageal reflux disease without esophagitis: Secondary | ICD-10-CM | POA: Diagnosis not present

## 2021-03-18 DIAGNOSIS — I10 Essential (primary) hypertension: Secondary | ICD-10-CM | POA: Diagnosis not present

## 2021-03-18 MED ORDER — SODIUM CHLORIDE 0.9 % IV SOLN: 500.0000 mL | INTRAVENOUS | Status: DC

## 2021-03-18 NOTE — Progress Notes (Signed)
Pt's states no medical or surgical changes since previsit or office visit. 

## 2021-03-18 NOTE — Progress Notes (Signed)
Called to room to assist during endoscopic procedure.  Patient ID and intended procedure confirmed with present staff. Received instructions for my participation in the procedure from the performing physician.  

## 2021-03-18 NOTE — Progress Notes (Signed)
Report to PACU, RN, vss, BBS= Clear.  

## 2021-03-18 NOTE — Patient Instructions (Signed)
Please read handouts provided. Continue present medications. Await pathology results. Repeat colonoscopy in 1 year for surveillance.     YOU HAD AN ENDOSCOPIC PROCEDURE TODAY AT THE [facility]:   Refer to the procedure report that was given to you for any specific questions about what was found during the examination.  If the procedure report does not answer your questions, please call your gastroenterologist to clarify.  If you requested that your care partner not be given the details of your procedure findings, then the procedure report has been included in a sealed envelope for you to review at your convenience later.  YOU SHOULD EXPECT: Some feelings of bloating in the abdomen. Passage of more gas than usual.  Walking can help get rid of the air that was put into your GI tract during the procedure and reduce the bloating. If you had a lower endoscopy (such as a colonoscopy or flexible sigmoidoscopy) you may notice spotting of blood in your stool or on the toilet paper. If you underwent a bowel prep for your procedure, you may not have a normal bowel movement for a few days.  Please Note:  You might notice some irritation and congestion in your nose or some drainage.  This is from the oxygen used during your procedure.  There is no need for concern and it should clear up in a day or so.  SYMPTOMS TO REPORT IMMEDIATELY:   Following lower endoscopy (colonoscopy or flexible sigmoidoscopy):  Excessive amounts of blood in the stool  Significant tenderness or worsening of abdominal pains  Swelling of the abdomen that is new, acute  Fever of 100F or higher    For urgent or emergent issues, a gastroenterologist can be reached at any hour by calling [phone]. Do not use MyChart messaging for urgent concerns.    DIET:  We do recommend a small meal at first, but then you may proceed to your regular diet.  Drink plenty of fluids but you should avoid alcoholic beverages for 24  hours.  ACTIVITY:  You should plan to take it easy for the rest of today and you should NOT DRIVE or use heavy machinery until tomorrow (because of the sedation medicines used during the test).    FOLLOW UP: Our staff will call the number listed on your records 48-72 hours following your procedure to check on you and address any questions or concerns that you may have regarding the information given to you following your procedure. If we do not reach you, we will leave a message.  We will attempt to reach you two times.  During this call, we will ask if you have developed any symptoms of COVID 19. If you develop any symptoms (ie: fever, flu-like symptoms, shortness of breath, cough etc.) before then, please call [phone].  If you test positive for Covid 19 in the 2 weeks post procedure, please call and report this information to us.    If any biopsies were taken you will be contacted by phone or by letter within the next 1-3 weeks.  Please call us at [phone] if you have not heard about the biopsies in 3 weeks.    SIGNATURES/CONFIDENTIALITY: You and/or your care partner have signed paperwork which will be entered into your electronic medical record.  These signatures attest to the fact that that the information above on your After Visit Summary has been reviewed and is understood.  Full responsibility of the confidentiality of this discharge information lies with   and/or your care-partner. 

## 2021-03-18 NOTE — Op Note (Signed)
[facility] Patient Name: Jessa Stinson Procedure Date: 03/18/2021 1:21 PM MRN: [id number] Endoscopist: Mallie Mussel L. Loletha Carrow , MD Age: 75 Referring MD:  Date of Birth: 1946-12-22 Gender: Female Account #: [id number] Procedure:                Colonoscopy Indications:              Screening for colorectal malignant neoplasm Medicines:                Monitored Anesthesia Care Procedure:                Pre-Anesthesia Assessment:                           - Prior to the procedure, a History and Physical                            was performed, and patient medications and                            allergies were reviewed. The patient's tolerance of                            previous anesthesia was also reviewed. The risks                            and benefits of the procedure and the sedation                            options and risks were discussed with the patient.                            All questions were answered, and informed consent                            was obtained. Prior Anticoagulants: The patient has                            taken no previous anticoagulant or antiplatelet                            agents. ASA Grade Assessment: III - A patient with                            severe systemic disease. After reviewing the risks                            and benefits, the patient was deemed in                            satisfactory condition to undergo the procedure.                           After obtaining informed consent, the colonoscope  was passed under direct vision. Throughout the                            procedure, the patient's blood pressure, pulse, and                            oxygen saturations were monitored continuously. The                            Olympus [id number]-[id number] (#[id number]) Colonoscope was                            introduced through the anus and advanced to the the                            cecum,  identified by appendiceal orifice and                            ileocecal valve. The Olympus [id number]-[id number] (#[id number])                            Colonoscope was introduced through the and advanced                            to the. The colonoscopy was somewhat difficult due                            to multiple diverticula in the colon, a redundant                            colon and a tortuous colon. The patient tolerated                            the procedure well. The quality of the bowel                            preparation was fair (opaque fluid and mostly                            diverticular stool balls that could not be                            cleared). The ileocecal valve, appendiceal orifice,                            and rectum were photographed. Scope In: 1:42:25 PM Scope Out: 2:06:14 PM Scope Withdrawal Time: 0 hours 20 minutes 17 seconds  Total Procedure Duration: 0 hours 23 minutes 49 seconds  Findings:                 The digital rectal exam findings include decreased                            sphincter tone.  Many small and large-mouthed diverticula were found                            in the entire colon.                           Three sessile polyps were found in the ascending                            colon. The polyps were diminutive in size. These                            polyps were removed with a cold snare. Resection                            and retrieval were complete.                           Four sessile polyps were found in the descending                            colon, transverse colon and ascending colon. The                            polyps were diminutive in size. These polyps were                            removed with a cold snare. Resection and retrieval                            were complete.                           The exam was otherwise without abnormality on                            direct and  retroflexion views. Complications:            No immediate complications. Estimated Blood Loss:     Estimated blood loss was minimal. Impression:               - Preparation of the colon was fair, secondary to                            extensive diverticulosis.                           - Decreased sphincter tone found on digital rectal                            exam.                           - Diverticulosis in the entire examined colon.                           -  Three diminutive polyps in the ascending colon,                            removed with a cold snare. Resected and retrieved.                           - Four diminutive polyps in the descending colon,                            in the transverse colon and in the ascending colon,                            removed with a cold snare. Resected and retrieved.                           - The examination was otherwise normal on direct                            and retroflexion views. Recommendation:           - Patient has a contact number available for                            emergencies. The signs and symptoms of potential                            delayed complications were discussed with the                            patient. Return to normal activities tomorrow.                            Written discharge instructions were provided to the                            patient.                           - Resume previous diet.                           - Continue present medications.                           - Await pathology results.                           - Repeat colonoscopy in 1 year for surveillance.                            (multiple polyps and prep quality) 2-DAY PREP FOR                            NEXT COLONOSCOPY Ethelmae Ringel L. Loletha Carrow, MD 03/18/2021 2:14:48 PM This report has been signed electronically.

## 2021-03-20 ENCOUNTER — Telehealth: Payer: Self-pay

## 2021-03-20 NOTE — Telephone Encounter (Signed)
  Follow up Call-  Call back number 03/18/2021 06/14/2020  Post procedure Call Back phone  # [phone]  Permission to leave phone message Yes -  Some recent data might be hidden     Patient questions:  Do you have a fever, pain , or abdominal swelling? No. Pain Score  0 *  Have you tolerated food without any problems? Yes.    Have you been able to return to your normal activities? Yes.    Do you have any questions about your discharge instructions: Diet   No. Medications  No. Follow up visit  No.  Do you have questions or concerns about your Care? No.  Actions: * If pain score is 4 or above: No action needed, pain <4.  1. Have you developed a fever since your procedure? no  2.   Have you had an respiratory symptoms (SOB or cough) since your procedure? no  3.   Have you tested positive for COVID 19 since your procedure no  4.   Have you had any family members/close contacts diagnosed with the COVID 19 since your procedure?  no   If yes to any of these questions please route to Joylene John, RN and Joella Prince, RN

## 2021-03-25 ENCOUNTER — Other Ambulatory Visit: Payer: Self-pay | Admitting: Endocrinology

## 2021-03-26 ENCOUNTER — Encounter: Payer: Self-pay | Admitting: Gastroenterology

## 2021-05-01 ENCOUNTER — Encounter: Payer: Self-pay | Admitting: Endocrinology

## 2021-05-02 ENCOUNTER — Other Ambulatory Visit: Payer: Self-pay | Admitting: Endocrinology

## 2021-05-02 MED ORDER — LEVOTHYROXINE SODIUM 88 MCG PO TABS: 88.0000 ug | ORAL_TABLET | Freq: Every day | ORAL | 3 refills | Status: DC

## 2021-05-16 ENCOUNTER — Telehealth: Payer: Self-pay | Admitting: Internal Medicine

## 2021-05-16 NOTE — Progress Notes (Signed)
  Chronic Care Management   Outreach Note  05/16/2021 Name: Rachel Gardner MRN: [id number] DOB: [birth year]  Referred by: Hoyt Koch, MD Reason for referral : No chief complaint on file.   An unsuccessful telephone outreach was attempted today. The patient was referred to the pharmacist for assistance with care management and care coordination.   Follow Up Plan:    [place]

## 2021-05-28 ENCOUNTER — Telehealth: Payer: Self-pay | Admitting: Internal Medicine

## 2021-05-28 NOTE — Progress Notes (Signed)
  Chronic Care Management   Outreach Note  05/28/2021 Name: ALISA STJAMES MRN: [id number] DOB: 1946/07/22  Referred by: Hoyt Koch, MD Reason for referral : No chief complaint on file.   An unsuccessful telephone outreach was attempted today. The patient was referred to the pharmacist for assistance with care management and care coordination.   Follow Up Plan:   [facility]

## 2021-06-11 ENCOUNTER — Telehealth: Payer: Self-pay | Admitting: Internal Medicine

## 2021-06-11 NOTE — Progress Notes (Signed)
  Chronic Care Management   Outreach Note  06/11/2021 Name: Rachel Gardner MRN: [id number] DOB: 08-07-1946  Referred by: Hoyt Koch, MD Reason for referral : No chief complaint on file.   Third unsuccessful telephone outreach was attempted today. The patient was referred to the pharmacist for assistance with care management and care coordination.   Follow Up Plan:   [facility]

## 2021-06-12 ENCOUNTER — Encounter: Payer: Self-pay | Admitting: Endocrinology

## 2021-06-12 ENCOUNTER — Other Ambulatory Visit: Payer: Self-pay | Admitting: Endocrinology

## 2021-06-12 ENCOUNTER — Other Ambulatory Visit: Payer: Self-pay

## 2021-06-12 ENCOUNTER — Other Ambulatory Visit (INDEPENDENT_AMBULATORY_CARE_PROVIDER_SITE_OTHER): Payer: Medicare Other

## 2021-06-12 DIAGNOSIS — E039 Hypothyroidism, unspecified: Secondary | ICD-10-CM | POA: Diagnosis not present

## 2021-06-12 LAB — T4, FREE: Free T4: 0.85 ng/dL (ref 0.60–1.60)

## 2021-06-12 LAB — TSH: TSH: 4.63 u[IU]/mL — ABNORMAL HIGH (ref 0.35–4.50)

## 2021-06-25 IMAGING — CT CT CHEST W/O CM
2 of 4 series · 11 of 36 positions shown, 13 images · non-contrast
Comparison: Chest CT 01/25/2020.

CLINICAL DATA: 74-year-old female with history of thoracic aortic
aneurysm. Follow-up study.

EXAM:
CT CHEST WITHOUT CONTRAST
TECHNIQUE: Multidetector CT imaging of the chest was performed following the
standard protocol without IV contrast.

[Series 2: chest 2.00 br40 s3 · axial · 0.48mm/px · z∈[+1445,+1681]mm · 8 of 140 slices shown, 10 images (1 of 2)]
[im 11/140  mediastinal]
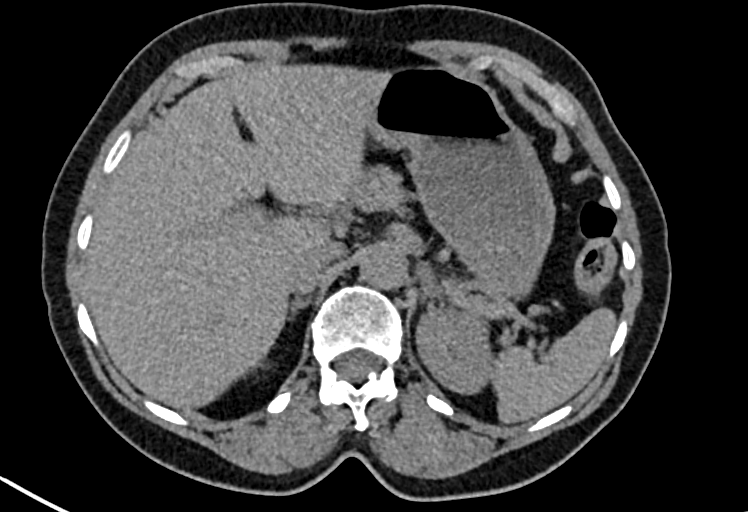
[im 11/140  lung]
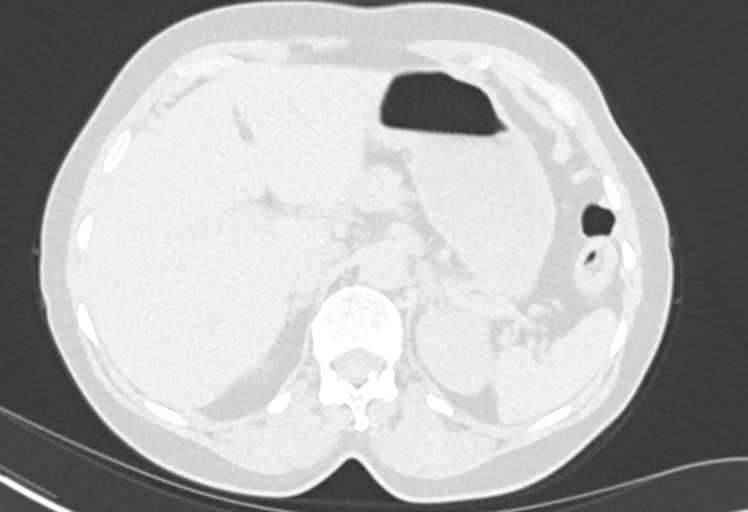
[im 33/140  lung]
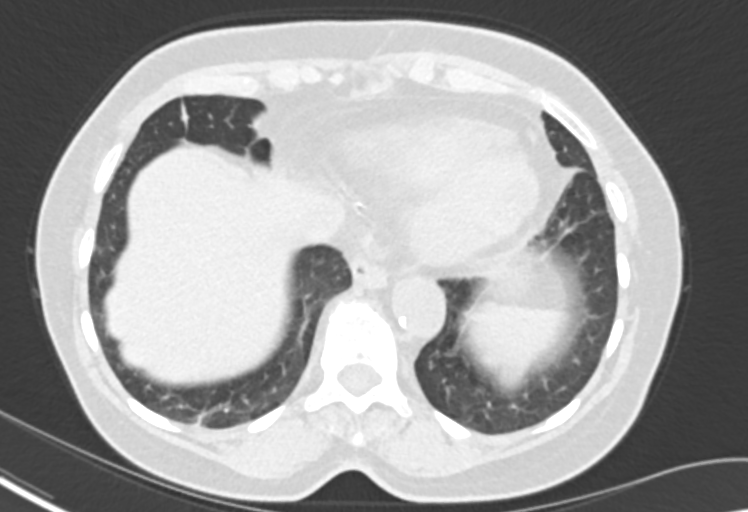
[im 43/140  lung]
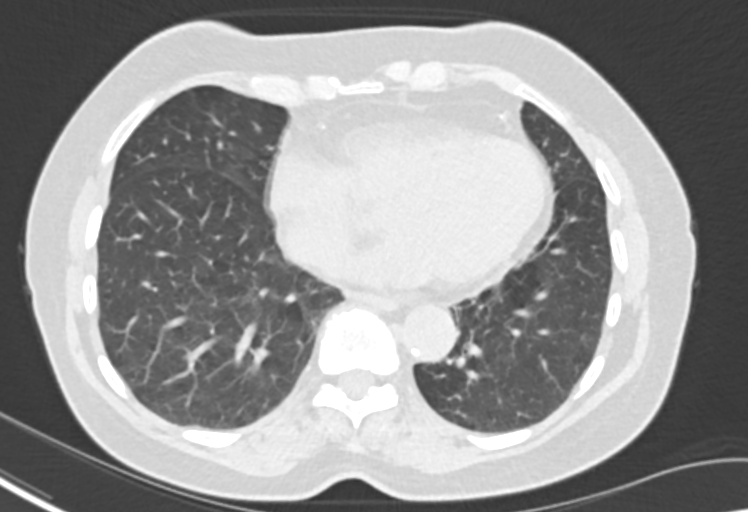
[im 65/140  lung]
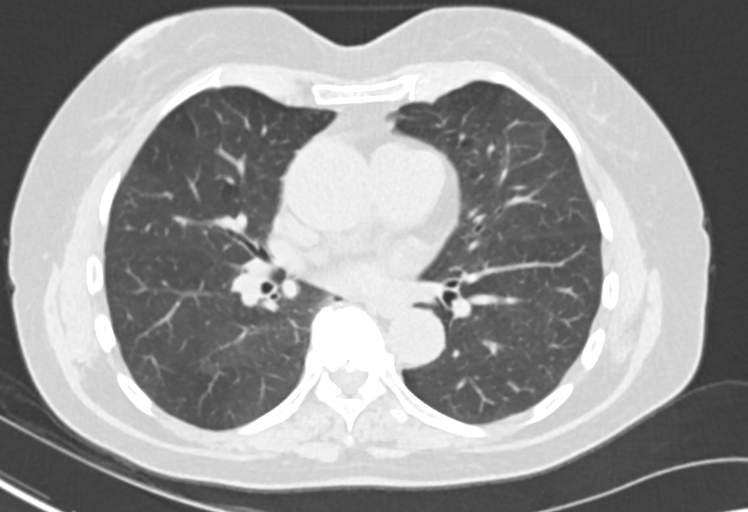
[im 75/140  mediastinal]
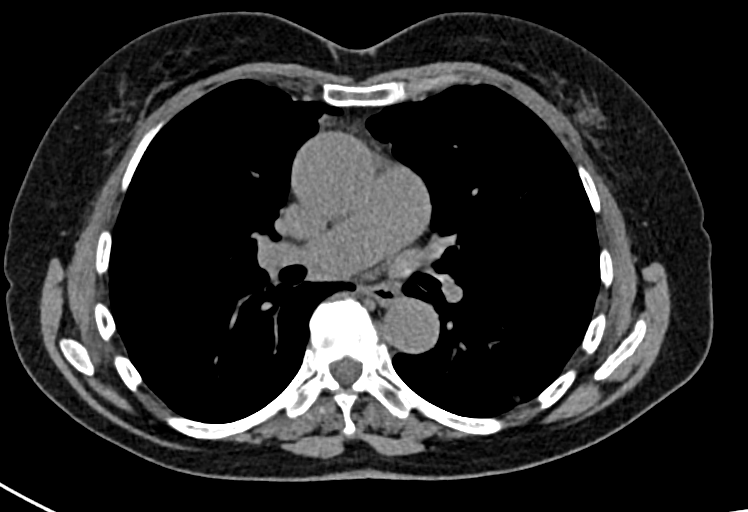
[im 75/140  lung]
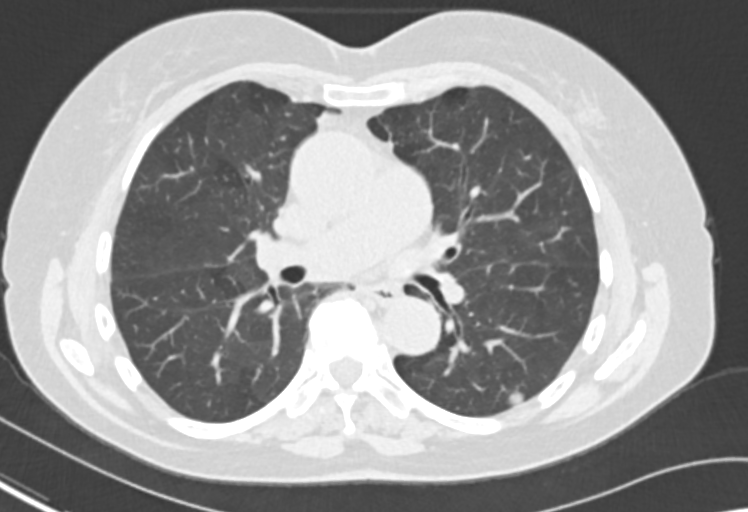
[im 97/140  lung]
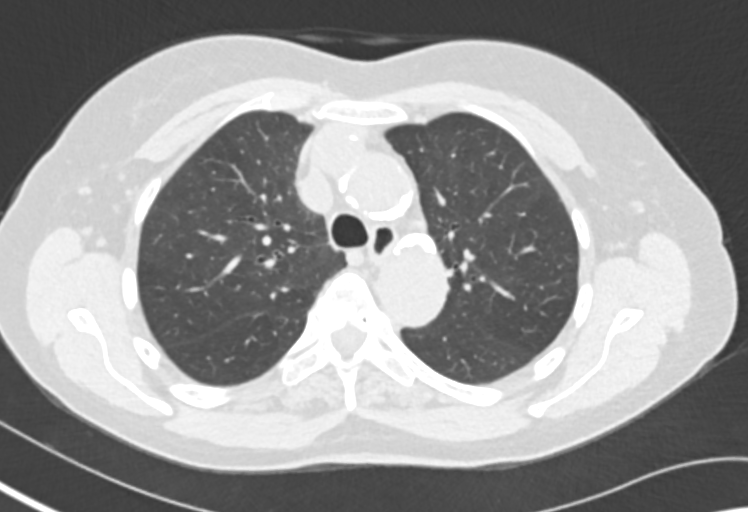
[im 107/140  lung]
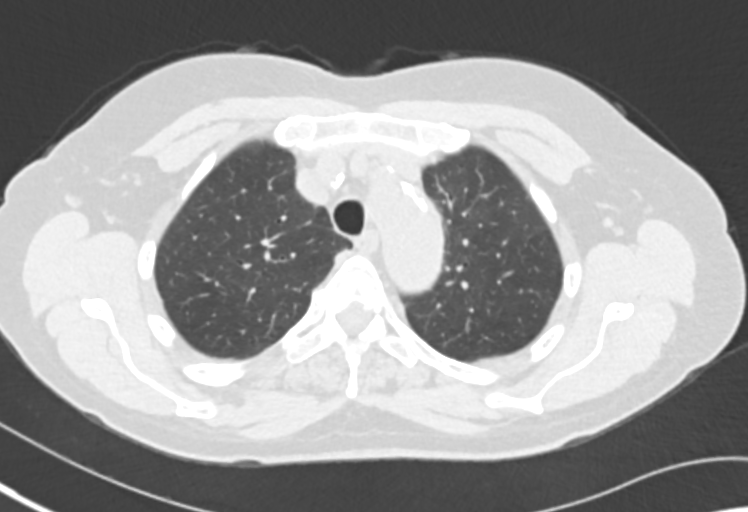
[im 129/140  lung]
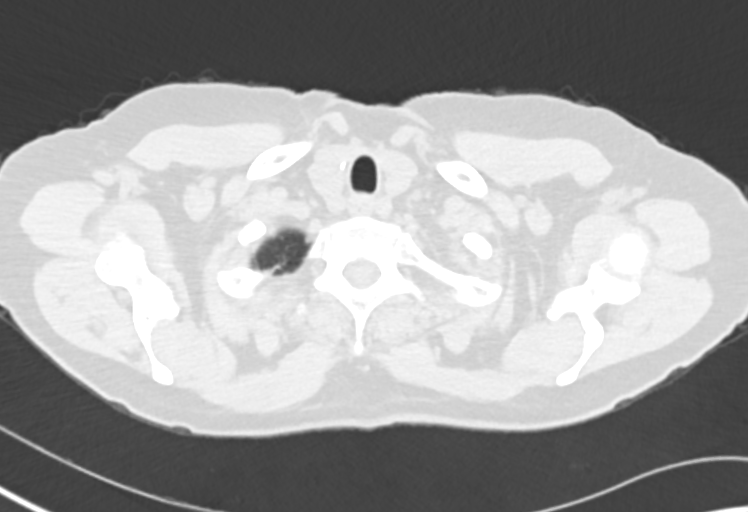

[Series 4: chest 2.00 br40 s3 · coronal · 0.55mm/px · 3 of 122 slices shown (2 of 2)]
[im 25/122  lung]
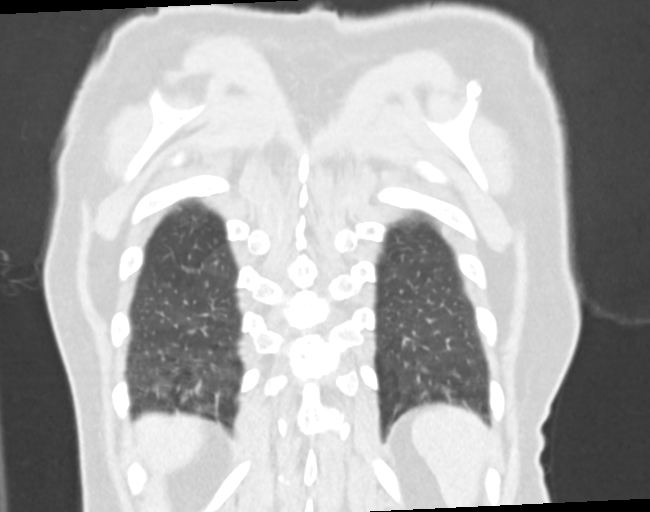
[im 49/122  lung]
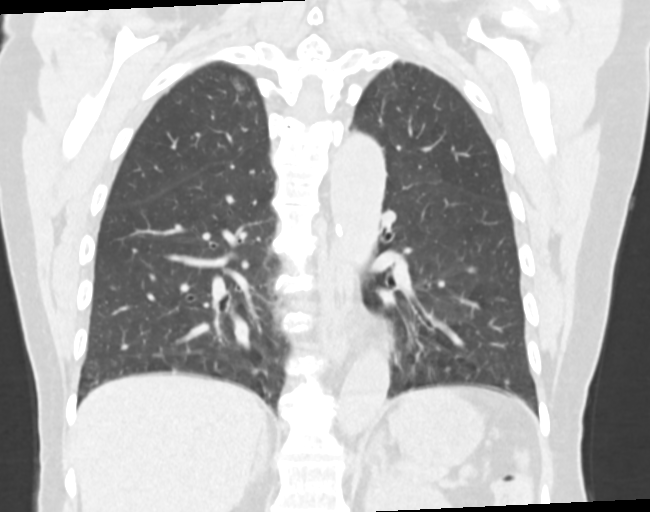
[im 73/122  lung]
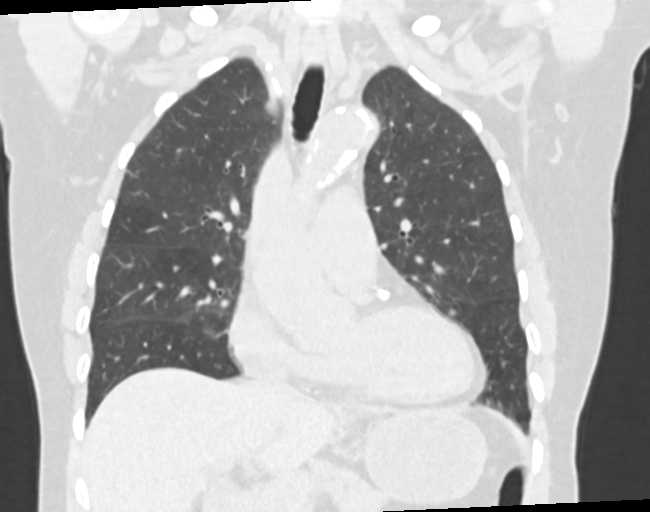

[11 of 36 positions shown; findings below may reference images not displayed]

FINDINGS: Cardiovascular: Heart size is normal. There is no significant
pericardial fluid, thickening or pericardial calcification. There is
aortic atherosclerosis, as well as atherosclerosis of the great
vessels of the mediastinum and the coronary arteries, including
calcified atherosclerotic plaque in the left main, left anterior
descending, left circumflex and right coronary arteries. Ectasia of
ascending thoracic aorta (4.2 cm in diameter), similar to the prior
study.

Mediastinum/Nodes: No pathologically enlarged mediastinal or hilar
lymph nodes. Please note that accurate exclusion of hilar adenopathy
is limited on noncontrast CT scans. Esophagus is unremarkable in
appearance. No axillary lymphadenopathy.

Lungs/Pleura: 9 mm smoothly marginated subpleural pulmonary nodule
in the periphery of the left lower lobe (axial image 65 of series
8), stable compared to prior examinations dating back to at least
December 31, 2017, considered definitively benign. No other suspicious
appearing pulmonary nodules or masses are noted. Mild linear
scarring in the lung bases bilaterally. No acute consolidative
airspace disease. No pleural effusions.

Upper Abdomen: Aortic atherosclerosis. Colonic diverticulosis noted
in the region of the splenic flexure.

Musculoskeletal: There are no aggressive appearing lytic or blastic
lesions noted in the visualized portions of the skeleton.
IMPRESSION: 1. Aortic atherosclerosis, in addition to left main and 3 vessel
coronary artery disease, as well as
similar ectasia of ascending thoracic aorta (4.2 cm in diameter).
Recommend annual imaging followup by CTA or MRA. This recommendation
follows 9525 ACCF/AHA/AATS/ACR/ASA/SCA/TUBA/EDITH/SANCHO/JHOJAN ESTIVEN Guidelines
for the Diagnosis and Management of Patients with Thoracic Aortic
Disease. Circulation. 9525; 121: E266-e369. Aortic aneurysm NOS
(GRW4L-MM2.3)
2. 9 mm smoothly marginated left lower lobe pulmonary nodule, stable
compared to prior examinations, considered definitively benign.
3. Colonic diverticulosis.

Aortic Atherosclerosis (GRW4L-IS1.1).

## 2021-07-10 ENCOUNTER — Other Ambulatory Visit: Payer: Self-pay

## 2021-07-10 ENCOUNTER — Other Ambulatory Visit (INDEPENDENT_AMBULATORY_CARE_PROVIDER_SITE_OTHER): Payer: Medicare Other

## 2021-07-10 DIAGNOSIS — E039 Hypothyroidism, unspecified: Secondary | ICD-10-CM | POA: Diagnosis not present

## 2021-07-10 LAB — TSH: TSH: 0.73 u[IU]/mL (ref 0.35–5.50)

## 2021-07-10 LAB — T4, FREE: Free T4: 1.17 ng/dL (ref 0.60–1.60)

## 2021-08-03 ENCOUNTER — Encounter: Payer: Self-pay | Admitting: Internal Medicine

## 2021-08-06 ENCOUNTER — Ambulatory Visit (INDEPENDENT_AMBULATORY_CARE_PROVIDER_SITE_OTHER): Payer: Medicare Other | Admitting: Internal Medicine

## 2021-08-06 ENCOUNTER — Encounter: Payer: Self-pay | Admitting: Endocrinology

## 2021-08-06 ENCOUNTER — Encounter: Payer: Self-pay | Admitting: Internal Medicine

## 2021-08-06 ENCOUNTER — Ambulatory Visit (INDEPENDENT_AMBULATORY_CARE_PROVIDER_SITE_OTHER): Payer: Medicare Other

## 2021-08-06 ENCOUNTER — Other Ambulatory Visit: Payer: Self-pay

## 2021-08-06 VITALS — BP 124/82 | HR 100 | Temp 98.3°F | Ht 61.0 in | Wt 129.0 lb

## 2021-08-06 DIAGNOSIS — I10 Essential (primary) hypertension: Secondary | ICD-10-CM | POA: Diagnosis not present

## 2021-08-06 DIAGNOSIS — J449 Chronic obstructive pulmonary disease, unspecified: Secondary | ICD-10-CM

## 2021-08-06 DIAGNOSIS — W57XXXA Bitten or stung by nonvenomous insect and other nonvenomous arthropods, initial encounter: Secondary | ICD-10-CM

## 2021-08-06 DIAGNOSIS — E559 Vitamin D deficiency, unspecified: Secondary | ICD-10-CM

## 2021-08-06 DIAGNOSIS — R5383 Other fatigue: Secondary | ICD-10-CM

## 2021-08-06 DIAGNOSIS — J439 Emphysema, unspecified: Secondary | ICD-10-CM | POA: Diagnosis not present

## 2021-08-06 DIAGNOSIS — T148XXA Other injury of unspecified body region, initial encounter: Secondary | ICD-10-CM

## 2021-08-06 DIAGNOSIS — E039 Hypothyroidism, unspecified: Secondary | ICD-10-CM

## 2021-08-06 HISTORY — DX: Chronic obstructive pulmonary disease, unspecified: J44.9

## 2021-08-06 LAB — BASIC METABOLIC PANEL
BUN: 24 mg/dL — ABNORMAL HIGH (ref 6–23)
CO2: 26 mEq/L (ref 19–32)
Calcium: 10.1 mg/dL (ref 8.4–10.5)
Chloride: 103 mEq/L (ref 96–112)
Creatinine, Ser: 0.91 mg/dL (ref 0.40–1.20)
GFR: 62.19 mL/min (ref >60.00)
Glucose, Bld: 84 mg/dL (ref 70–99)
Potassium: 3.6 mEq/L (ref 3.5–5.1)
Sodium: 139 mEq/L (ref 135–145)

## 2021-08-06 LAB — HEPATIC FUNCTION PANEL
ALT: 12 U/L (ref 0–35)
AST: 15 U/L (ref 0–37)
Albumin: 4.2 g/dL (ref 3.5–5.2)
Alkaline Phosphatase: 80 U/L (ref 39–117)
Bilirubin, Direct: 0.1 mg/dL (ref 0.0–0.3)
Total Bilirubin: 0.4 mg/dL (ref 0.2–1.2)
Total Protein: 7.8 g/dL (ref 6.0–8.3)

## 2021-08-06 LAB — URINALYSIS, ROUTINE W REFLEX MICROSCOPIC
Bilirubin Urine: NEGATIVE
Ketones, ur: NEGATIVE
Leukocytes,Ua: NEGATIVE
Nitrite: NEGATIVE
Specific Gravity, Urine: 1.025 (ref 1.000–1.030)
Urine Glucose: NEGATIVE
Urobilinogen, UA: 0.2 (ref 0.0–1.0)
pH: 5.5 (ref 5.0–8.0)

## 2021-08-06 LAB — VITAMIN D 25 HYDROXY (VIT D DEFICIENCY, FRACTURES): VITD: 53.29 ng/mL (ref 30.00–100.00)

## 2021-08-06 LAB — CBC WITH DIFFERENTIAL/PLATELET
Basophils Absolute: 0 10*3/uL (ref 0.0–0.1)
Basophils Relative: 0.4 % (ref 0.0–3.0)
Eosinophils Absolute: 0.2 10*3/uL (ref 0.0–0.7)
Eosinophils Relative: 1.9 % (ref 0.0–5.0)
HCT: 45.3 % (ref 36.0–46.0)
Hemoglobin: 15 g/dL (ref 12.0–15.0)
Lymphocytes Relative: 32.3 % (ref 12.0–46.0)
Lymphs Abs: 3.4 10*3/uL (ref 0.7–4.0)
MCHC: 33.2 g/dL (ref 30.0–36.0)
MCV: 86.2 fl (ref 78.0–100.0)
Monocytes Absolute: 0.9 10*3/uL (ref 0.1–1.0)
Monocytes Relative: 8.2 % (ref 3.0–12.0)
Neutro Abs: 6.1 10*3/uL (ref 1.4–7.7)
Neutrophils Relative %: 57.2 % (ref 43.0–77.0)
Platelets: 206 10*3/uL (ref 150.0–400.0)
RBC: 5.25 Mil/uL — ABNORMAL HIGH (ref 3.87–5.11)
RDW: 13.3 % (ref 11.5–15.5)
WBC: 10.7 10*3/uL — ABNORMAL HIGH (ref 4.0–10.5)

## 2021-08-06 LAB — SEDIMENTATION RATE: Sed Rate: 14 mm/hr (ref 0–30)

## 2021-08-06 LAB — TSH: TSH: 0.29 u[IU]/mL — ABNORMAL LOW (ref 0.35–5.50)

## 2021-08-06 LAB — C-REACTIVE PROTEIN: CRP: <1 mg/dL (ref 0.5–20.0)

## 2021-08-06 NOTE — Progress Notes (Signed)
Patient ID: Rachel Gardner, female   DOB: [birth year], 75 y.o.   MRN: [id number]        Chief Complaint: follow up fatigue and possible lyme dz exposure       HPI:  Rachel Gardner is a 75 y.o. 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  Also had an episode of exposure to numerous insect probaby mostquito bites all at one time when she was swarmed in the garden last wed prior to onset of symptoms.  Bites now results.  Pt denies chest pain, increased sob or doe, wheezing, orthopnea, PND, increased LE swelling, palpitations, dizziness or syncope.   Pt denies polydipsia, polyuria, or new focal neuro s/s.  Denies urinary symptoms such as dysuria, frequency, urgency, flank pain, hematuria or n/v, fever, chills.  Denies worsening reflux, abd pain, dysphagia, n/v, bowel change or blood.   Pt denies fever, wt loss, night sweats, loss of appetite, or other constitutional symptoms but just doesn't feel right. Wt Readings from Last 3 Encounters:  08/06/21 129 lb (58.5 kg)  03/18/21 130 lb (59 kg)  03/11/21 130 lb (59 kg)   BP Readings from Last 3 Encounters:  08/06/21 124/82  03/18/21 115/69  01/17/21 122/74         Past Medical History:  Diagnosis Date   Allergy    seasonal allergies   Ascending aortic aneurysm (HCC)    Blood in stool    BPPV (benign paroxysmal positional vertigo)    Cataract    bilateral-sx   COPD (chronic obstructive pulmonary disease) ([place]) 08/06/2021   CPAP (continuous positive airway pressure) dependence 02/09/2019   Diverticulitis    Fatty liver 05/28/10   per CT at [facility]   Genital herpes     GERD (gastroesophageal reflux disease)    on meds   Glaucoma    eye drops for tx   Helicobacter pylori infection    Hiatal hernia 05/28/10   CT @ [facility]   Hyperlipidemia    on meds   Hypertension    on meds   Hypothyroidism    on meds   Incontinence of urine in female    Kidney stones    Lung nodule seen on imaging study    Meniere's disease    Obstructive sleep apnea    OSA on CPAP 04/13/2019   Moderate with AHI 20/hr now on CPAP at 13cm H2O   Sleep apnea    uses CPAP   Past Surgical History:  Procedure Laterality Date   ABDOMINAL HYSTERECTOMY  1995   ANAL RECTAL MANOMETRY N/A 08/06/2016   Procedure: ANO RECTAL MANOMETRY;  Surgeon: Doran Stabler, MD;  Location: [facility];  Service: Gastroenterology;  Laterality: N/A;   APPENDECTOMY  1989   BPPV     x 4 after L cochlear implant   BREAST SURGERY  1989   breast reduction   CATARACT EXTRACTION, BILATERAL     CHOLECYSTECTOMY  1989   COCHLEAR IMPLANT     Left/2009; Right/2012  COLONOSCOPY  2012   normal   LAPAROSCOPIC SIGMOID COLECTOMY  2012   with takedown of splenic flexure; cystoscopy with bilateral ureteral stent placement   REDUCTION MAMMAPLASTY Bilateral 1989   [place]   TOTAL THYROIDECTOMY  1995   WISDOM TOOTH EXTRACTION      reports that she quit smoking about 6 months ago. Her smoking use included cigarettes. She has a 20.50 pack-year smoking history. She has never used smokeless tobacco. She reports current alcohol use. She reports that she does not use drugs. family history includes Colon cancer in her cousin and maternal aunt; Hyperlipidemia in her brother; Hypertension in her father, mother, and sister; Prostate cancer in her brother and paternal uncle; Thyroid disease in her sister. Allergies  Allergen Reactions   Morphine Nausea And Vomiting   Morphine And Related     Nausea Can tolerate Dilaudid   Statins Other (See Comments)    Joint pain    Tape Other (See Comments)    Causes redness  and will take skin when removed   Wellbutrin [Bupropion] Other (See Comments)    Suicidal intensions   Denture Adhesive Rash   Current Outpatient Medications on File Prior to Visit  Medication Sig Dispense Refill   ascorbic acid (VITAMIN C) 1000 MG tablet Take 500 mg by mouth daily.      aspirin 81 MG chewable tablet Chew 81 mg by mouth daily.      bimatoprost (LUMIGAN) 0.01 % SOLN Place 1 drop into both eyes at bedtime.     CALCIUM PO Take 1 tablet by mouth daily.     Cholecalciferol (VITAMIN D3 PO) Take 1 tablet by mouth daily.     Cyanocobalamin (VITAMIN B-12 PO) Take 1 tablet by mouth daily.     fluticasone (FLONASE) 50 MCG/ACT nasal spray Place 2 sprays into both nostrils at bedtime.     hydrochlorothiazide (HYDRODIURIL) 25 MG tablet Take 1 tablet (25 mg total) by mouth daily. 90 tablet 3   levothyroxine (SYNTHROID) 88 MCG tablet Take 1 tablet (88 mcg total) by mouth daily. 90 tablet 3   losartan (COZAAR) 100 MG tablet Take 1 tablet (100 mg total) by mouth daily. 90 tablet 3   meclizine (ANTIVERT) 25 MG tablet Take 1 tablet (25 mg total) by mouth 3 (three) times daily as needed for dizziness. 90 tablet 9   pantoprazole (PROTONIX) 40 MG tablet TAKE 1 TABLET(40 MG) BY MOUTH TWICE DAILY (Patient taking differently: 2 (two) times daily as needed.) 180 tablet 1   Probiotic Product (PROBIOTIC ADVANCED PO) Take 1 capsule by mouth daily.     BINAXNOW COVID-19 AG HOME TEST KIT TEST AS DIRECTED TODAY     ID NOW COVID-19 KIT TEST AS DIRECTED TODAY     No current facility-administered medications on file prior to visit.        ROS:  All others reviewed and negative.  Objective        PE:  BP 124/82 (BP Location: Left Arm, Patient Position: Sitting, Cuff Size: Normal)   Pulse 100   Temp 98.3 F (36.8 C) (Oral)   Ht 5' 1" (1.549 m)   Wt 129 lb (58.5 kg)   SpO2 98%   BMI 24.37 kg/m                 Constitutional: Pt appears in NAD               HENT: Head: NCAT.  Right  Ear: External ear normal.                 Left Ear: External ear normal.                Eyes: . Pupils are equal, round, and reactive to light. Conjunctivae and EOM are normal               Nose: without d/c or deformity               Neck: Neck supple. Gross normal ROM               Cardiovascular: Normal rate and regular rhythm.                 Pulmonary/Chest: Effort normal and breath sounds without rales or wheezing.                Abd:  Soft, NT, ND, + BS, no organomegaly               Neurological: Pt is alert. At baseline orientation, motor grossly intact               Skin: Skin is warm. No rashes, no other new lesions, LE edema - none               Psychiatric: Pt behavior is normal without agitation   Micro: none  Cardiac tracings I have personally interpreted today:  none  Pertinent Radiological findings (summarize): none   Lab Results  Component Value Date   WBC 10.7 (H) 08/06/2021   HGB 15.0 08/06/2021   HCT 45.3 08/06/2021   PLT 206.0 08/06/2021   GLUCOSE 84 08/06/2021   CHOL 216 (H) 12/17/2020   TRIG 191.0 (H) 12/17/2020   HDL 39.00 (L) 12/17/2020   LDLDIRECT 166.0 02/07/2019   LDLCALC 138 (H) 12/17/2020   ALT 12 08/06/2021   AST 15 08/06/2021   NA 139 08/06/2021   K 3.6 08/06/2021   CL 103 08/06/2021   CREATININE 0.91 08/06/2021   BUN 24 (H) 08/06/2021   CO2 26 08/06/2021   TSH 0.29 (L) 08/06/2021   HGBA1C 5.8 12/17/2020   Assessment/Plan:  Rachel Gardner is a 75 y.o. White or Caucasian [1] female with  has a past medical history of Allergy, Ascending aortic aneurysm ([place]), Blood in stool, BPPV (benign paroxysmal positional vertigo), Cataract, COPD (chronic obstructive pulmonary disease) ([place]) (08/06/2021), CPAP (continuous positive airway pressure) dependence (02/09/2019), Diverticulitis, Fatty liver (05/28/10), Genital herpes, GERD (gastroesophageal reflux disease), Glaucoma, Helicobacter pylori infection, Hiatal hernia (05/28/10), Hyperlipidemia, Hypertension,  Hypothyroidism, Incontinence of urine in female, Kidney stones, Lung nodule seen on imaging study, Meniere's disease, Obstructive sleep apnea, OSA on CPAP (04/13/2019), and Sleep apnea.  Insect bite Signs resolved on exam today, continue to follow  HTN (hypertension) BP Readings from Last 3 Encounters:  08/06/21 124/82  03/18/21 115/69  01/17/21 122/74   Stable, pt to continue medical treatment * - diet, losartan, hct  Fatigue etilogy uncelar, exam bengn, hard to be specific about symptoms, now for labs including cbc and tsh,  to f/u any worsening symptoms or concerns  Vitamin D deficiency Last vitamin D Lab Results  Component Value Date   VD25OH 53.29 08/06/2021   Stable, cont oral replacement   Acquired hypothyroidism Lab Results  Component Value Date   TSH 0.29 (L) 08/06/2021   Stable, pt to continue levothyroxine  Current Outpatient Medications (Endocrine & Metabolic):    levothyroxine (SYNTHROID)  88 MCG tablet, Take 1 tablet (88 mcg total) by mouth daily.  Current Outpatient Medications (Cardiovascular):    hydrochlorothiazide (HYDRODIURIL) 25 MG tablet, Take 1 tablet (25 mg total) by mouth daily.   losartan (COZAAR) 100 MG tablet, Take 1 tablet (100 mg total) by mouth daily.  Current Outpatient Medications (Respiratory):    fluticasone (FLONASE) 50 MCG/ACT nasal spray, Place 2 sprays into both nostrils at bedtime.  Current Outpatient Medications (Analgesics):    aspirin 81 MG chewable tablet, Chew 81 mg by mouth daily.   Current Outpatient Medications (Hematological):    Cyanocobalamin (VITAMIN B-12 PO), Take 1 tablet by mouth daily.  Current Outpatient Medications (Other):    ascorbic acid (VITAMIN C) 1000 MG tablet, Take 500 mg by mouth daily.    bimatoprost (LUMIGAN) 0.01 % SOLN, Place 1 drop into both eyes at bedtime.   CALCIUM PO, Take 1 tablet by mouth daily.   Cholecalciferol (VITAMIN D3 PO), Take 1 tablet by mouth daily.   meclizine (ANTIVERT) 25 MG  tablet, Take 1 tablet (25 mg total) by mouth 3 (three) times daily as needed for dizziness.   pantoprazole (PROTONIX) 40 MG tablet, TAKE 1 TABLET(40 MG) BY MOUTH TWICE DAILY (Patient taking differently: 2 (two) times daily as needed.)   Probiotic Product (PROBIOTIC ADVANCED PO), Take 1 capsule by mouth daily.   BINAXNOW COVID-19 AG HOME TEST KIT, TEST AS DIRECTED TODAY   ID NOW COVID-19 KIT, TEST AS DIRECTED TODAY   COPD (chronic obstructive pulmonary disease) (HCC) For cxr, but o/w Stable, no evidence for exaerbation, ok to follow, continue curent tx  - none, declines need for inhaler  Followup: Return if symptoms worsen or fail to improve.  Cathlean Cower, MD 08/10/2021 1:40 PM [facility]

## 2021-08-06 NOTE — Patient Instructions (Signed)
Please continue all other medications as before, and refills have been done if requested.  Please have the pharmacy call with any other refills you may need.  Please continue your efforts at being more active, low cholesterol diet, and weight control.  You are otherwise up to date with prevention measures today.  Please keep your appointments with your specialists as you may have planned  Please go to the XRAY Department in the first floor for the x-ray testing  Please go to the LAB at the blood drawing area for the tests to be done  You will be contacted by phone if any changes need to be made immediately.  Otherwise, you will receive a letter about your results with an explanation, but please check with MyChart first.  Please remember to sign up for MyChart if you have not done so, as this will be important to you in the future with finding out test results, communicating by private email, and scheduling acute appointments online when needed.

## 2021-08-07 ENCOUNTER — Encounter: Payer: Self-pay | Admitting: Internal Medicine

## 2021-08-07 LAB — B. BURGDORFI ANTIBODIES: B burgdorferi Ab IgG+IgM: <0.9 index

## 2021-08-08 ENCOUNTER — Ambulatory Visit: Payer: Medicare Other | Admitting: Internal Medicine

## 2021-08-10 NOTE — Assessment & Plan Note (Signed)
Lab Results  Component Value Date   TSH 0.29 (L) 08/06/2021   Stable, pt to continue levothyroxine  Current Outpatient Medications (Endocrine & Metabolic):  .  levothyroxine (SYNTHROID) 88 MCG tablet, Take 1 tablet (88 mcg total) by mouth daily.  Current Outpatient Medications (Cardiovascular):  .  hydrochlorothiazide (HYDRODIURIL) 25 MG tablet, Take 1 tablet (25 mg total) by mouth daily. Marland Kitchen  losartan (COZAAR) 100 MG tablet, Take 1 tablet (100 mg total) by mouth daily.  Current Outpatient Medications (Respiratory):  .  fluticasone (FLONASE) 50 MCG/ACT nasal spray, Place 2 sprays into both nostrils at bedtime.  Current Outpatient Medications (Analgesics):  .  aspirin 81 MG chewable tablet, Chew 81 mg by mouth daily.   Current Outpatient Medications (Hematological):  Marland Kitchen  Cyanocobalamin (VITAMIN B-12 PO), Take 1 tablet by mouth daily.  Current Outpatient Medications (Other):  .  ascorbic acid (VITAMIN C) 1000 MG tablet, Take 500 mg by mouth daily.  .  bimatoprost (LUMIGAN) 0.01 % SOLN, Place 1 drop into both eyes at bedtime. Marland Kitchen  CALCIUM PO, Take 1 tablet by mouth daily. .  Cholecalciferol (VITAMIN D3 PO), Take 1 tablet by mouth daily. .  meclizine (ANTIVERT) 25 MG tablet, Take 1 tablet (25 mg total) by mouth 3 (three) times daily as needed for dizziness. .  pantoprazole (PROTONIX) 40 MG tablet, TAKE 1 TABLET(40 MG) BY MOUTH TWICE DAILY (Patient taking differently: 2 (two) times daily as needed.) .  Probiotic Product (PROBIOTIC ADVANCED PO), Take 1 capsule by mouth daily. Marland Kitchen  BINAXNOW COVID-19 AG HOME TEST KIT, TEST AS DIRECTED TODAY .  ID NOW COVID-19 KIT, TEST AS DIRECTED TODAY

## 2021-08-10 NOTE — Assessment & Plan Note (Signed)
Signs resolved on exam today, continue to follow

## 2021-08-10 NOTE — Assessment & Plan Note (Addendum)
For cxr, but o/w Stable, no evidence for exaerbation, ok to follow, continue curent tx  - none, declines need for inhaler

## 2021-08-10 NOTE — Assessment & Plan Note (Signed)
BP Readings from Last 3 Encounters:  08/06/21 124/82  03/18/21 115/69  01/17/21 122/74   Stable, pt to continue medical treatment * - diet, losartan, hct

## 2021-08-10 NOTE — Assessment & Plan Note (Signed)
etilogy uncelar, exam bengn, hard to be specific about symptoms, now for labs including cbc and tsh,  to f/u any worsening symptoms or concerns

## 2021-08-10 NOTE — Assessment & Plan Note (Signed)
Last vitamin D Lab Results  Component Value Date   VD25OH 53.29 08/06/2021   Stable, cont oral replacement

## 2021-08-16 DIAGNOSIS — H401131 Primary open-angle glaucoma, bilateral, mild stage: Secondary | ICD-10-CM | POA: Diagnosis not present

## 2021-08-16 DIAGNOSIS — Z961 Presence of intraocular lens: Secondary | ICD-10-CM | POA: Diagnosis not present

## 2021-10-22 DIAGNOSIS — Z9621 Cochlear implant status: Secondary | ICD-10-CM | POA: Diagnosis not present

## 2021-10-22 DIAGNOSIS — Z45321 Encounter for adjustment and management of cochlear device: Secondary | ICD-10-CM | POA: Diagnosis not present

## 2021-10-22 DIAGNOSIS — H903 Sensorineural hearing loss, bilateral: Secondary | ICD-10-CM | POA: Diagnosis not present

## 2021-10-23 ENCOUNTER — Other Ambulatory Visit: Payer: Self-pay

## 2021-10-23 ENCOUNTER — Encounter: Payer: Self-pay | Admitting: Cardiology

## 2021-10-23 ENCOUNTER — Ambulatory Visit: Payer: Medicare Other | Admitting: Cardiology

## 2021-10-23 VITALS — BP 104/64 | HR 94 | Ht 61.0 in | Wt 138.6 lb

## 2021-10-23 DIAGNOSIS — G4733 Obstructive sleep apnea (adult) (pediatric): Secondary | ICD-10-CM

## 2021-10-23 DIAGNOSIS — I10 Essential (primary) hypertension: Secondary | ICD-10-CM | POA: Diagnosis not present

## 2021-10-23 NOTE — Progress Notes (Signed)
Date:  10/23/2021   ID:  Rachel Gardner, DOB 11-07-1946, MRN [id number]   PCP:  Hoyt Koch, MD  Cardiologist:  Candee Furbish, MD Sleep Medicine:  Fransico Him, MD Electrophysiologist:  None   Chief Complaint:  OSa  History of Present Illness:    Rachel Gardner is a 75 y.o. female  with a history of ascending aortic aneurysm, HTN and hyperlipidemia who was referred for sleep study by Dr. Marlou Porch due to snoring.  She was found to have moderate OSA with an AHI of 20.5/hr and O2 desats as low as 80%.  She underwent CPAP titration to 11cm H2O.    She is here today to follow-up on her sleep apnea and CPAP therapy. She is not using her device for the past month because she was in [place]. Before that trip she was using it nightly. She feels the pressure is adequate.  She does not like the FFM she currently is using as it straps across her neck and hurts her cervical spine and numbness in her arms bilaterally.  Since going on CPAP she feels rested in the am and has no significant daytime sleepiness.  She denies any significant mouth or nasal dryness or nasal congestion.  She does not think that he snores.    Prior CV studies:   The following studies were reviewed today:  PAP download  Past Medical History:  Diagnosis Date   Allergy    seasonal allergies   Ascending aortic aneurysm    Blood in stool    BPPV (benign paroxysmal positional vertigo)    Cataract    bilateral-sx   COPD (chronic obstructive pulmonary disease) ([place]) 08/06/2021   CPAP (continuous positive airway pressure) dependence 02/09/2019   Diverticulitis    Fatty liver 05/28/10   per [facility] at [facility]   Genital herpes    GERD (gastroesophageal reflux disease)    on meds   Glaucoma    eye drops for tx   Helicobacter pylori infection    Hiatal hernia 05/28/10   CT @ [facility]   Hyperlipidemia    on meds   Hypertension    on meds   Hypothyroidism    on meds   Incontinence of urine in  female    Kidney stones    Lung nodule seen on imaging study    Meniere's disease    Obstructive sleep apnea    OSA on CPAP 04/13/2019   Moderate with AHI 20/hr now on CPAP at 13cm H2O   Sleep apnea    uses CPAP   Past Surgical History:  Procedure Laterality Date   ABDOMINAL HYSTERECTOMY  1995   ANAL RECTAL MANOMETRY N/A 08/06/2016   Procedure: ANO RECTAL MANOMETRY;  Surgeon: Doran Stabler, MD;  Location: [facility];  Service: Gastroenterology;  Laterality: N/A;   APPENDECTOMY  1989   BPPV     x 4 after L cochlear implant   BREAST SURGERY  1989   breast reduction   CATARACT EXTRACTION, BILATERAL     CHOLECYSTECTOMY  1989   COCHLEAR IMPLANT     Left/2009; Right/2012   COLONOSCOPY  2012   normal   LAPAROSCOPIC SIGMOID COLECTOMY  2012   with takedown of splenic flexure; cystoscopy with bilateral ureteral stent placement   REDUCTION MAMMAPLASTY Bilateral 1989   [place]   TOTAL THYROIDECTOMY  1995   WISDOM TOOTH EXTRACTION       Current Meds  Medication Sig  ascorbic acid (VITAMIN C) 1000 MG tablet Take 500 mg by mouth daily.    aspirin 81 MG chewable tablet Chew 81 mg by mouth daily.    bimatoprost (LUMIGAN) 0.01 % SOLN Place 1 drop into both eyes at bedtime.   CALCIUM PO Take 1 tablet by mouth daily.   Cholecalciferol (VITAMIN D3 PO) Take 1 tablet by mouth daily.   Cyanocobalamin (VITAMIN B-12 PO) Take 1 tablet by mouth daily.   fluticasone (FLONASE) 50 MCG/ACT nasal spray Place 2 sprays into both nostrils at bedtime.   hydrochlorothiazide (HYDRODIURIL) 25 MG tablet Take 1 tablet (25 mg total) by mouth daily.   levothyroxine (SYNTHROID) 88 MCG tablet Take 1 tablet (88 mcg total) by mouth daily.   losartan (COZAAR) 100 MG tablet Take 1 tablet (100 mg total) by mouth daily.   meclizine (ANTIVERT) 25 MG tablet Take 1 tablet (25 mg total) by mouth 3 (three) times daily as needed for dizziness.   pantoprazole (PROTONIX) 40 MG tablet TAKE 1 TABLET(40 MG) BY MOUTH TWICE DAILY  (Patient taking differently: 2 (two) times daily as needed.)   Probiotic Product (PROBIOTIC ADVANCED PO) Take 1 capsule by mouth daily.     Allergies:   Morphine, Morphine and related, Statins, Tape, Wellbutrin [bupropion], and Denture adhesive   Social History   Tobacco Use   Smoking status: Former    Packs/day: 0.50    Years: 41.00    Pack years: 20.50    Types: Cigarettes    Quit date: 01/25/2021    Years since quitting: 0.7   Smokeless tobacco: Never  Vaping Use   Vaping Use: Never used  Substance Use Topics   Alcohol use: Yes    Alcohol/week: 0.0 standard drinks    Comment: very rare   Drug use: No     Family Hx: The patient's family history includes Colon cancer in her cousin and maternal aunt; Hyperlipidemia in her brother; Hypertension in her father, mother, and sister; Prostate cancer in her brother and paternal uncle; Thyroid disease in her sister. There is no history of Colon polyps, Esophageal cancer, Stomach cancer, or Rectal cancer.  ROS:   Please see the history of present illness.     All other systems reviewed and are negative.   Labs/Other Tests and Data Reviewed:    Recent Labs: 08/06/2021: ALT 12; BUN 24; Creatinine, Ser 0.91; Hemoglobin 15.0; Platelets 206.0; Potassium 3.6; Sodium 139; TSH 0.29   Recent Lipid Panel Lab Results  Component Value Date/Time   CHOL 216 (H) 12/17/2020 08:30 AM   CHOL 222 (H) 12/16/2016 08:26 AM   TRIG 191.0 (H) 12/17/2020 08:30 AM   HDL 39.00 (L) 12/17/2020 08:30 AM   HDL 48 12/16/2016 08:26 AM   CHOLHDL 6 12/17/2020 08:30 AM   LDLCALC 138 (H) 12/17/2020 08:30 AM   LDLCALC 138 (H) 12/16/2016 08:26 AM   LDLDIRECT 166.0 02/07/2019 09:07 AM    Wt Readings from Last 3 Encounters:  10/23/21 138 lb 9.6 oz (62.9 kg)  08/06/21 129 lb (58.5 kg)  03/18/21 130 lb (59 kg)     Objective:    Vital Signs:  BP 104/64   Pulse 94   Ht 5\' 1"  (1.549 m)   Wt 138 lb 9.6 oz (62.9 kg)   SpO2 97%   BMI 26.19 kg/m    GEN: Well  nourished, well developed in no acute distress HEENT: Normal NECK: No JVD; No carotid bruits LYMPHATICS: No lymphadenopathy CARDIAC:RRR, no murmurs, rubs, gallops RESPIRATORY:  Clear  to auscultation without rales, wheezing or rhonchi  ABDOMEN: Soft, non-tender, non-distended MUSCULOSKELETAL:  No edema; No deformity  SKIN: Warm and dry NEUROLOGIC:  Alert and oriented x 3 PSYCHIATRIC:  Normal affect   ASSESSMENT & PLAN:    1.  OSA - the patient is tolerating PAP therapy but is having a lot of numbness in her arms and pain in her cervical spine when she uses it due to the strap coming across her neck.  -Her son will bring her ST card in to get a download to make sure her therapy is adequate -I will set up an appt with the DME to look at different mask options  2.  HTN - -BP is controlled on exam today. -Continue prescription drug management with losartan  100mg  daily and HCTZ 25mg  daily with as needed refills   Medication Adjustments/Labs and Tests Ordered: Current medicines are reviewed at length with the patient today.  Concerns regarding medicines are outlined above.  Tests Ordered: Orders Placed This Encounter  Procedures   EKG 12-Lead    Medication Changes: No orders of the defined types were placed in this encounter.   Disposition:  Follow up in 1 year(s)  Signed, Fransico Him, MD  10/23/2021 10:07 AM    [facility]

## 2021-10-23 NOTE — Patient Instructions (Signed)

## 2021-10-25 ENCOUNTER — Ambulatory Visit (INDEPENDENT_AMBULATORY_CARE_PROVIDER_SITE_OTHER): Payer: Medicare Other | Admitting: Internal Medicine

## 2021-10-25 ENCOUNTER — Other Ambulatory Visit: Payer: Self-pay

## 2021-10-25 ENCOUNTER — Encounter: Payer: Self-pay | Admitting: Internal Medicine

## 2021-10-25 VITALS — BP 122/80 | HR 108 | Ht 61.0 in | Wt 136.4 lb

## 2021-10-25 DIAGNOSIS — R159 Full incontinence of feces: Secondary | ICD-10-CM | POA: Diagnosis not present

## 2021-10-25 DIAGNOSIS — R051 Acute cough: Secondary | ICD-10-CM | POA: Diagnosis not present

## 2021-10-25 DIAGNOSIS — R32 Unspecified urinary incontinence: Secondary | ICD-10-CM | POA: Diagnosis not present

## 2021-10-25 MED ORDER — PROMETHAZINE-DM 6.25-15 MG/5ML PO SYRP: 5.0000 mL | ORAL_SOLUTION | Freq: Four times a day (QID) | ORAL | 0 refills | Status: DC | PRN

## 2021-10-25 NOTE — Assessment & Plan Note (Signed)
Saw urologist in [place] with some workup done she has paperwork. They recommended myrbetriq which she bought there but did not start. Encouraged her to try this for a month. Would like local doctor for this and did not like provider at [facility] urology seen in the past so referral to urogynecology done.

## 2021-10-25 NOTE — Assessment & Plan Note (Signed)
She states she saw a Radiographer, therapeutic in [place] and they wanted to do anal manometry but she was unable to get this completed. She has previously been referred to surgeon here and she could not recall if this was helpful. Decreased spinchter tone noted on recent colonoscopy.

## 2021-10-25 NOTE — Progress Notes (Signed)
   Subjective:   Patient ID: Rachel Gardner, female    DOB: 11-09-1946, 75 y.o.   MRN: [id number]  HPI The patient is a 75 YO female coming in for new cough and several chronic issues.   Review of Systems  Constitutional:  Positive for activity change. Negative for fatigue, fever and unexpected weight change.  HENT:  Positive for congestion. Negative for ear discharge, ear pain, sinus pain, sneezing, sore throat, tinnitus, trouble swallowing and voice change.   Eyes: Negative.   Respiratory:  Positive for cough. Negative for chest tightness, shortness of breath and wheezing.   Cardiovascular: Negative.  Negative for chest pain, palpitations and leg swelling.  Gastrointestinal:  Positive for diarrhea. Negative for abdominal distention, abdominal pain, constipation, nausea and vomiting.  Genitourinary:        Incontinence  Musculoskeletal: Negative.   Skin: Negative.   Neurological: Negative.   Psychiatric/Behavioral: Negative.     Objective:  Physical Exam Constitutional:      Appearance: She is well-developed.  HENT:     Head: Normocephalic and atraumatic.  Cardiovascular:     Rate and Rhythm: Normal rate and regular rhythm.  Pulmonary:     Effort: Pulmonary effort is normal. No respiratory distress.     Breath sounds: Normal breath sounds. No wheezing or rales.     Comments: Coughing during visit with some production Abdominal:     General: Bowel sounds are normal. There is no distension.     Palpations: Abdomen is soft.     Tenderness: There is no abdominal tenderness. There is no rebound.  Musculoskeletal:     Cervical back: Normal range of motion.  Skin:    General: Skin is warm and dry.  Neurological:     Mental Status: She is alert and oriented to person, place, and time.     Coordination: Coordination normal.    Vitals:   10/25/21 0811  BP: 122/80  Pulse: (!) 108  SpO2: 98%  Weight: 136 lb 6.4 oz (61.9 kg)  Height: 5\' 1"  (1.549 m)    This visit occurred  during the SARS-CoV-2 public health emergency.  Safety protocols were in place, including screening questions prior to the visit, additional usage of staff PPE, and extensive cleaning of exam room while observing appropriate contact time as indicated for disinfecting solutions.   Assessment & Plan:  Visit time 20 minutes in face to face communication with patient and coordination of care, additional 10 minutes spent in record review, coordination or care, ordering tests, communicating/referring to other healthcare professionals, documenting in medical records all on the same day of the visit for total time 30 minutes spent on the visit.

## 2021-10-25 NOTE — Patient Instructions (Addendum)
It is okay to start the bladder medicine and if at the end of the month not helping it is okay to stop.  We will get you in with the urogynecologist to get the urine issues checked out.   We have sent in cough medicine to help break up the phlegm.

## 2021-10-25 NOTE — Assessment & Plan Note (Signed)
Rx promethazine/dm cough medicine. Seems to be viral in origin advised of timeline. Can continue otc flonase as well.

## 2021-10-28 DIAGNOSIS — M545 Low back pain, unspecified: Secondary | ICD-10-CM | POA: Diagnosis not present

## 2021-10-28 DIAGNOSIS — M5459 Other low back pain: Secondary | ICD-10-CM | POA: Diagnosis not present

## 2021-10-28 DIAGNOSIS — M6281 Muscle weakness (generalized): Secondary | ICD-10-CM | POA: Diagnosis not present

## 2021-10-28 DIAGNOSIS — R2689 Other abnormalities of gait and mobility: Secondary | ICD-10-CM | POA: Diagnosis not present

## 2021-11-01 ENCOUNTER — Other Ambulatory Visit: Payer: Self-pay

## 2021-11-01 ENCOUNTER — Encounter: Payer: Self-pay | Admitting: Internal Medicine

## 2021-11-01 ENCOUNTER — Ambulatory Visit: Payer: Medicare Other | Admitting: Internal Medicine

## 2021-11-01 VITALS — BP 126/80 | HR 86 | Ht 62.0 in | Wt 138.2 lb

## 2021-11-01 DIAGNOSIS — R2689 Other abnormalities of gait and mobility: Secondary | ICD-10-CM | POA: Diagnosis not present

## 2021-11-01 DIAGNOSIS — J209 Acute bronchitis, unspecified: Secondary | ICD-10-CM | POA: Diagnosis not present

## 2021-11-01 DIAGNOSIS — Z87891 Personal history of nicotine dependence: Secondary | ICD-10-CM | POA: Diagnosis not present

## 2021-11-01 DIAGNOSIS — M6281 Muscle weakness (generalized): Secondary | ICD-10-CM | POA: Diagnosis not present

## 2021-11-01 DIAGNOSIS — M545 Low back pain, unspecified: Secondary | ICD-10-CM | POA: Diagnosis not present

## 2021-11-01 DIAGNOSIS — J439 Emphysema, unspecified: Secondary | ICD-10-CM | POA: Diagnosis not present

## 2021-11-01 DIAGNOSIS — M5459 Other low back pain: Secondary | ICD-10-CM | POA: Diagnosis not present

## 2021-11-01 MED ORDER — AZITHROMYCIN 250 MG PO TABS: 250.0000 mg | ORAL_TABLET | Freq: Every day | ORAL | 0 refills | Status: DC

## 2021-11-01 MED ORDER — ANORO ELLIPTA 62.5-25 MCG/ACT IN AEPB: 1.0000 | INHALATION_SPRAY | Freq: Every day | RESPIRATORY_TRACT | 0 refills | Status: DC

## 2021-11-01 NOTE — Patient Instructions (Addendum)
Multiple lung nodules on CT  - cT chest jan 2021 per Dr Cyndia Bent -> jan 2022 stable without lung cancer   Plan  - CT Jan 2023 per Dr Cyndia Bent   Smoking history with emphysema on CT Jan 2022 and on CXR Aug 2022 Mild acute bronchitis currently    Glad you quit smoking - Z Pak empiric  - Continue anoro scheduled daily -> might simplify to Spiriva at follow-up based on PFT results of the home - check alpha 1 AT phenotype 11/01/2021 - do Full PFT  Followup   - return next available after PFT

## 2021-11-01 NOTE — Progress Notes (Signed)
Subjective:    Patient ID: Rachel Gardner, female    DOB: [birth year], 75 y.o.   MRN: [id number]  IOV 02/03/2019    HPI  Chief Complaint  Patient presents with   Consult    Pt referred due to CT scan that was performed. Pt stated the CT showed that she had a lung nodule. Pt does have complaints of cough with clear mucus. Denies any complaints of SOB or chest tightness.   Rachel Gardner 75 y.o. -is a retired diabetes Investment banker, corporate.  She is from the state of [place] in [place].  She migrated to [place] in the 1990s.  She is a continued smoker.  She follows every year with Dr. Arvid Right for thoracic aorta aneurysm with a CT scan of the chest.  She says she is known to have a left lower lobe subpleural 8 mm nodule since the 1990s.  And the most recent CT chest January 2020 this was stable.  However radiologist reported a right subpleural 4 mm nodule by the minor fissure.  The radiologist also noted that this was stable since 2017.  Patient contends that this was not documented in the prior report which is true.  Therefore she is wondering how the radiologist can reported as being stable for the last 3 years.  I personally visualized the findings and confirmed the presence of the left lower lobe nodule and stability.  Also confirmed and demonstrated to her the current 4 mm nodule that was seen even in the prior scans but probably missed in the prior reports.  She wants to follow-up with me for the nodules once a year but after Dr. Mohammed Kindle does the CT scans for the thoracic aorta aneurysm.  In terms of her respiratory health she is overall asymptomatic.,  She travels widely across the world.  In 2019 she did 8 international trips including annual trips to [place].  She is now planning a trip to [place] because her daughter-in-law is running a marathon.    For the last few days she has a scratchy throat and a cough after picking up her respiratory illness from her grandchild.  Her son lives in  [place] and is a Geophysicist/field seismologist Rachel Gardner).  She says he does not know about her visit to the clinic today.  In addition she had some abdominal symptoms and had CT abdomen and it is noted that she has some intra-abdominal lymphadenopathy that is slightly enlarged compared to 4 years earlier.  She has seen Dr. Loanne Drilling her endocrinologist who is referring her to a hematologist.  Noted she is never had pulmonary function testing before.  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  She uses hearing aids. Results for Rachel Gardner (MRN [id number]) as of 02/03/2019 16:09  Ref. Range 01/14/2014 23:15 05/24/2014 17:18 03/06/2017 00:00 11/30/2018 13:35  Eosinophils Absolute Latest Ref Range: 0.0 - 0.7 K/uL 0.2 0.1  0.2    Review of Systems  Constitutional: Negative for fever and unexpected weight change.  HENT: Positive for hearing loss, postnasal drip and sinus pressure. Negative for congestion, dental problem, ear pain, nosebleeds, rhinorrhea, sneezing, sore throat and trouble swallowing.   Eyes: Negative for redness and itching.  Respiratory: Positive for cough. Negative for chest tightness, shortness of breath and wheezing.   Cardiovascular: Negative for palpitations and leg swelling.  Gastrointestinal: Negative for nausea and vomiting.  Genitourinary: Negative for dysuria.  Musculoskeletal: Negative for joint swelling.  Skin: Negative for rash.  Allergic/Immunologic: Positive for environmental allergies. Negative for food allergies and immunocompromised state.  Neurological: Negative for headaches.  Hematological: Does not bruise/bleed easily.  Psychiatric/Behavioral: Negative for dysphoric mood. The patient is not nervous/anxious.      has a past medical history of Allergy, Ascending aortic aneurysm ([place]), Blood in stool, BPPV (benign paroxysmal positional vertigo), Diverticulitis, Fatty liver (05/28/10), Genital herpes, GERD (gastroesophageal reflux disease),  Glaucoma, Helicobacter pylori infection, Hiatal hernia (05/28/10), Hyperlipidemia, Hypertension, Hypothyroidism, Incontinence of urine in female, Kidney stones, Lung nodule seen on imaging study, Meniere's disease, and Obstructive sleep apnea.   reports that she has been smoking cigarettes. She has a 20.50 pack-year smoking history. She has never used smokeless tobacco.   OV 11/01/2021  Subjective:  Patient ID: Rachel Gardner, female , DOB: 1946-03-02 , age 19 y.o. , MRN: [id number] , ADDRESS: [address] [place] [address] PCP Hoyt Koch, MD Patient Care Team: Hoyt Koch, MD as PCP - General (Internal Medicine) Jerline Pain, MD as PCP - Cardiology (Cardiology) Sueanne Margarita, MD as PCP - Sleep Medicine (Cardiology) Lafayette Dragon, MD (Inactive) as Consulting Physician (Gastroenterology) Suella Broad, MD as Consulting Physician (Physical Medicine and Rehabilitation) Estevan Ryder, MD as Referring Physician (Cardiology) Renato Shin, MD as Consulting Physician (Endocrinology) Vicie Mutters, MD as Consulting Physician (Otolaryngology)  This Provider for this visit: Treatment Team:  Attending Provider: Brand Males, MD    11/01/2021 -   Chief Complaint  Patient presents with   Follow-up    Patient has been having some congestion the week. Productive yellow cough worse at night.      HPI Rachel Gardner 75 y.o. -last seen in February 2020.  After that not seen.  She returns now saying she quit smoking 4 months ago.  She says in August 2022 she had a chest x-ray and this showed emphysema and this really surprised her because in January 2022 she had a CT scan with Dr. Caffie Pinto cardiothoracic surgeon for aorta and emphysema was not reported [I personally visualized that CT scan right now and there is some emphysema].  She has chronic stable benign pulmonary nodules are also on the CT scan.  She says she went to [place] in August/September 2022  after the chest x-ray.  And the pulmonologist that gave her Anoro.  She feels it is not fully helped her symptoms.  Nevertheless she quit.  She came back and then in the last week or so she got exposed to her grandchild with respiratory infection and then she has increased cough with some congestion and occasional yellow sputum.  There is no wheezing or chest tightness or shortness of breath.  2 years ago mention about getting pulmonary function test in the context of heavy smoking.  She wants to get this done.    CT Chest data JAn 2022  Narrative & Impression  CLINICAL DATA:  75 year old female with history of thoracic aortic aneurysm. Follow-up study.   EXAM: CT CHEST WITHOUT CONTRAST   TECHNIQUE: Multidetector CT imaging of the chest was performed following the standard protocol without IV contrast.   COMPARISON:  Chest CT 01/25/2020.   FINDINGS: Cardiovascular: Heart size is normal. There is no significant pericardial fluid, thickening or pericardial calcification. There is aortic atherosclerosis, as well as atherosclerosis of the great vessels of the mediastinum and the coronary arteries, including calcified atherosclerotic plaque in the left main, left anterior descending, left circumflex and right coronary  arteries. Ectasia of ascending thoracic aorta (4.2 cm in diameter), similar to the prior study.   Mediastinum/Nodes: No pathologically enlarged mediastinal or hilar lymph nodes. Please note that accurate exclusion of hilar adenopathy is limited on noncontrast CT scans. Esophagus is unremarkable in appearance. No axillary lymphadenopathy.   Lungs/Pleura: 9 mm smoothly marginated subpleural pulmonary nodule in the periphery of the left lower lobe (axial image 65 of series 8), stable compared to prior examinations dating back to at least December 31, 2017, considered definitively benign. No other suspicious appearing pulmonary nodules or masses are noted. Mild  linear scarring in the lung bases bilaterally. No acute consolidative airspace disease. No pleural effusions.   Upper Abdomen: Aortic atherosclerosis. Colonic diverticulosis noted in the region of the splenic flexure.   Musculoskeletal: There are no aggressive appearing lytic or blastic lesions noted in the visualized portions of the skeleton.   IMPRESSION: 1. Aortic atherosclerosis, in addition to left main and 3 vessel coronary artery disease, as well as similar ectasia of ascending thoracic aorta (4.2 cm in diameter). Recommend annual imaging followup by CTA or MRA. This recommendation follows 2010 ACCF/AHA/AATS/ACR/ASA/SCA/SCAI/SIR/STS/SVM Guidelines for the Diagnosis and Management of Patients with Thoracic Aortic Disease. Circulation. 2010; 121: T888-K800. Aortic aneurysm NOS (ICD10-I71.9) 2. 9 mm smoothly marginated left lower lobe pulmonary nodule, stable compared to prior examinations, considered definitively benign. 3. Colonic diverticulosis.   Aortic Atherosclerosis (ICD10-I70.0).     Electronically Signed   By: Vinnie Langton M.D.   On: 01/16/2021 11:50      No results found.    PFT  No flowsheet data found.     has a past medical history of Allergy, Ascending aortic aneurysm, Blood in stool, BPPV (benign paroxysmal positional vertigo), Cataract, COPD (chronic obstructive pulmonary disease) ([place]) (08/06/2021), CPAP (continuous positive airway pressure) dependence (02/09/2019), Diverticulitis, Fatty liver (05/28/10), Genital herpes, GERD (gastroesophageal reflux disease), Glaucoma, Helicobacter pylori infection, Hiatal hernia (05/28/10), Hyperlipidemia, Hypertension, Hypothyroidism, Incontinence of urine in female, Kidney stones, Lung nodule seen on imaging study, Meniere's disease, Obstructive sleep apnea, OSA on CPAP (04/13/2019), and Sleep apnea.   reports that she quit smoking about 3 months ago. Her smoking use included cigarettes. She has a 20.50 pack-year  smoking history. She has never used smokeless tobacco.  Past Surgical History:  Procedure Laterality Date   ABDOMINAL HYSTERECTOMY  1995   ANAL RECTAL MANOMETRY N/A 08/06/2016   Procedure: ANO RECTAL MANOMETRY;  Surgeon: Doran Stabler, MD;  Location: [facility];  Service: Gastroenterology;  Laterality: N/A;   APPENDECTOMY  1989   BPPV     x 4 after L cochlear implant   BREAST SURGERY  1989   breast reduction   CATARACT EXTRACTION, BILATERAL     CHOLECYSTECTOMY  1989   COCHLEAR IMPLANT     Left/2009; Right/2012   COLONOSCOPY  2012   normal   LAPAROSCOPIC SIGMOID COLECTOMY  2012   with takedown of splenic flexure; cystoscopy with bilateral ureteral stent placement   REDUCTION MAMMAPLASTY Bilateral 1989   [place]   TOTAL THYROIDECTOMY  1995   WISDOM TOOTH EXTRACTION      Allergies  Allergen Reactions   Morphine Nausea And Vomiting   Morphine And Related     Nausea Can tolerate Dilaudid   Statins Other (See Comments)    Joint pain    Tape Other (See Comments)    Causes redness and will take skin when removed   Wellbutrin [Bupropion] Other (See Comments)    Suicidal intensions  Denture Adhesive Rash    Immunization History  Administered Date(s) Administered   Fluad Quad(high Dose 65+) 09/10/2019, 12/14/2020   Influenza, High Dose Seasonal PF 10/08/2017, 10/11/2018   Influenza,inj,Quad PF,6+ Mos 10/03/2013, 11/16/2014, 09/24/2015, 09/19/2016   Moderna Sars-Covid-2 Vaccination 02/10/2020, 03/10/2020, 11/01/2020   Pneumococcal Conjugate-13 10/03/2013   Pneumococcal Polysaccharide-23 04/28/2008, 09/24/2015   Td 08/09/2014    Family History  Problem Relation Age of Onset   Hypertension Mother    Hypertension Father    Hypertension Sister    Prostate cancer Brother    Hyperlipidemia Brother    Colon cancer Maternal Aunt    Prostate cancer Paternal Uncle    Colon cancer Cousin        x 2; paternal   Thyroid disease Sister    Colon polyps Neg Hx    Esophageal  cancer Neg Hx    Stomach cancer Neg Hx    Rectal cancer Neg Hx      Current Outpatient Medications:    ascorbic acid (VITAMIN C) 1000 MG tablet, Take 500 mg by mouth daily. , Disp: , Rfl:    aspirin 81 MG chewable tablet, Chew 81 mg by mouth daily. , Disp: , Rfl:    bimatoprost (LUMIGAN) 0.01 % SOLN, Place 1 drop into both eyes at bedtime., Disp: , Rfl:    BINAXNOW COVID-19 AG HOME TEST KIT, TEST AS DIRECTED TODAY, Disp: , Rfl:    CALCIUM PO, Take 1 tablet by mouth daily., Disp: , Rfl:    Cholecalciferol (VITAMIN D3 PO), Take 1 tablet by mouth daily., Disp: , Rfl:    Cyanocobalamin (VITAMIN B-12 PO), Take 1 tablet by mouth daily., Disp: , Rfl:    fluticasone (FLONASE) 50 MCG/ACT nasal spray, Place 2 sprays into both nostrils at bedtime., Disp: , Rfl:    hydrochlorothiazide (HYDRODIURIL) 25 MG tablet, Take 1 tablet (25 mg total) by mouth daily., Disp: 90 tablet, Rfl: 3   ID NOW COVID-19 KIT, TEST AS DIRECTED TODAY, Disp: , Rfl:    levothyroxine (SYNTHROID) 88 MCG tablet, Take 1 tablet (88 mcg total) by mouth daily., Disp: 90 tablet, Rfl: 3   losartan (COZAAR) 100 MG tablet, Take 1 tablet (100 mg total) by mouth daily., Disp: 90 tablet, Rfl: 3   meclizine (ANTIVERT) 25 MG tablet, Take 1 tablet (25 mg total) by mouth 3 (three) times daily as needed for dizziness., Disp: 90 tablet, Rfl: 9   pantoprazole (PROTONIX) 40 MG tablet, TAKE 1 TABLET(40 MG) BY MOUTH TWICE DAILY (Patient taking differently: 2 (two) times daily as needed.), Disp: 180 tablet, Rfl: 1   Probiotic Product (PROBIOTIC ADVANCED PO), Take 1 capsule by mouth daily., Disp: , Rfl:    promethazine-dextromethorphan (PROMETHAZINE-DM) 6.25-15 MG/5ML syrup, Take 5 mLs by mouth 4 (four) times daily as needed for cough., Disp: 118 mL, Rfl: 0   umeclidinium-vilanterol (ANORO ELLIPTA) 62.5-25 MCG/ACT AEPB, Inhale 1 puff into the lungs daily., Disp: 60 each, Rfl: 0      Objective:   Vitals:   11/01/21 1507  BP: 126/80  Pulse: 86  SpO2:  95%  Weight: 138 lb 3.2 oz (62.7 kg)  Height: 5' 2" (1.575 m)    Estimated body mass index is 25.28 kg/m as calculated from the following:   Height as of this encounter: 5' 2" (1.575 m).   Weight as of this encounter: 138 lb 3.2 oz (62.7 kg).  _0 @  Filed Weights   11/01/21 1507  Weight: 138 lb 3.2 oz (62.7 kg)  Physical Exam   General: No distress. Looks well Neuro: Alert and Oriented x 3. GCS 15. Speech normal Psych: Pleasant Resp:  Barrel Chest - no.  Wheeze - no, Crackles - no, No overt respiratory distress CVS: Normal heart sounds. Murmurs - no Ext: Stigmata of Connective Tissue Disease - no HEENT: Normal upper airway. PEERL +. No post nasal drip        Assessment:       ICD-10-CM   1. Acute bronchitis, unspecified organism  J20.9     2. Pulmonary emphysema, unspecified emphysema type ([place])  J43.9     3. Stopped smoking with greater than 20 pack year history  Z87.891          Plan:     Patient Instructions  Multiple lung nodules on CT  - cT chest jan 2021 per Dr Cyndia Bent -> jan 2022 stable without lung cancer   Plan  - CT Jan 2023 per Dr Cyndia Bent   Smoking history with emphysema on CT Jan 2022 and on CXR Aug 2022 Mild acute bronchitis currently    Glad you quit smoking - Z Pak empiric  - Continue anoro scheduled daily -> might simplify to Spiriva at follow-up based on PFT results of the home - check alpha 1 AT phenotype 11/01/2021 - do Full PFT  Followup   - return next available after PFT     SIGNATURE    Dr. Brand Males, M.D., F.C.C.P,  Pulmonary and Critical Care Medicine Staff Physician, [place] Director - Interstitial Lung Disease  Program  Pulmonary [place] [facility] [place], [place], [address]  Pager: [phone], If no answer or between  15:00h - 7:00h: call [phone] Telephone: [phone]  3:38 PM 11/01/2021

## 2021-11-04 ENCOUNTER — Other Ambulatory Visit: Payer: Medicare Other

## 2021-11-04 DIAGNOSIS — M545 Low back pain, unspecified: Secondary | ICD-10-CM | POA: Diagnosis not present

## 2021-11-04 DIAGNOSIS — Z87891 Personal history of nicotine dependence: Secondary | ICD-10-CM

## 2021-11-04 DIAGNOSIS — J439 Emphysema, unspecified: Secondary | ICD-10-CM | POA: Diagnosis not present

## 2021-11-04 DIAGNOSIS — M6281 Muscle weakness (generalized): Secondary | ICD-10-CM | POA: Diagnosis not present

## 2021-11-04 DIAGNOSIS — J209 Acute bronchitis, unspecified: Secondary | ICD-10-CM | POA: Diagnosis not present

## 2021-11-04 DIAGNOSIS — R2689 Other abnormalities of gait and mobility: Secondary | ICD-10-CM | POA: Diagnosis not present

## 2021-11-04 DIAGNOSIS — M5459 Other low back pain: Secondary | ICD-10-CM | POA: Diagnosis not present

## 2021-11-06 DIAGNOSIS — R2689 Other abnormalities of gait and mobility: Secondary | ICD-10-CM | POA: Diagnosis not present

## 2021-11-06 DIAGNOSIS — M545 Low back pain, unspecified: Secondary | ICD-10-CM | POA: Diagnosis not present

## 2021-11-06 DIAGNOSIS — M6281 Muscle weakness (generalized): Secondary | ICD-10-CM | POA: Diagnosis not present

## 2021-11-06 DIAGNOSIS — M5459 Other low back pain: Secondary | ICD-10-CM | POA: Diagnosis not present

## 2021-11-08 DIAGNOSIS — M6281 Muscle weakness (generalized): Secondary | ICD-10-CM | POA: Diagnosis not present

## 2021-11-08 DIAGNOSIS — M545 Low back pain, unspecified: Secondary | ICD-10-CM | POA: Diagnosis not present

## 2021-11-08 DIAGNOSIS — M5459 Other low back pain: Secondary | ICD-10-CM | POA: Diagnosis not present

## 2021-11-08 DIAGNOSIS — R2689 Other abnormalities of gait and mobility: Secondary | ICD-10-CM | POA: Diagnosis not present

## 2021-11-12 DIAGNOSIS — M545 Low back pain, unspecified: Secondary | ICD-10-CM | POA: Diagnosis not present

## 2021-11-12 DIAGNOSIS — M6281 Muscle weakness (generalized): Secondary | ICD-10-CM | POA: Diagnosis not present

## 2021-11-12 DIAGNOSIS — M5459 Other low back pain: Secondary | ICD-10-CM | POA: Diagnosis not present

## 2021-11-12 DIAGNOSIS — R2689 Other abnormalities of gait and mobility: Secondary | ICD-10-CM | POA: Diagnosis not present

## 2021-11-13 ENCOUNTER — Ambulatory Visit: Payer: Medicare Other | Admitting: Cardiology

## 2021-11-13 ENCOUNTER — Other Ambulatory Visit: Payer: Self-pay

## 2021-11-13 ENCOUNTER — Encounter: Payer: Self-pay | Admitting: Cardiology

## 2021-11-13 VITALS — BP 100/60 | HR 90 | Ht 62.0 in | Wt 137.0 lb

## 2021-11-13 DIAGNOSIS — I2510 Atherosclerotic heart disease of native coronary artery without angina pectoris: Secondary | ICD-10-CM | POA: Diagnosis not present

## 2021-11-13 DIAGNOSIS — R0602 Shortness of breath: Secondary | ICD-10-CM

## 2021-11-13 DIAGNOSIS — I700 Atherosclerosis of aorta: Secondary | ICD-10-CM

## 2021-11-13 DIAGNOSIS — I7121 Aneurysm of the ascending aorta, without rupture: Secondary | ICD-10-CM | POA: Diagnosis not present

## 2021-11-13 DIAGNOSIS — I2584 Coronary atherosclerosis due to calcified coronary lesion: Secondary | ICD-10-CM

## 2021-11-13 DIAGNOSIS — E782 Mixed hyperlipidemia: Secondary | ICD-10-CM

## 2021-11-13 NOTE — Assessment & Plan Note (Signed)
Continue to encourage lifestyle modification.  Not interested in statin.  Nuclear stress test 2020 reassuring.

## 2021-11-13 NOTE — Assessment & Plan Note (Signed)
4.2 cm stable.  CT scan 01/25/2020.  01/16/2021.

## 2021-11-13 NOTE — Assessment & Plan Note (Signed)
Encourage statin therapy in the past.  She is not interested.

## 2021-11-13 NOTE — Assessment & Plan Note (Signed)
Checking echocardiogram.  She has felt poor after her viral episode.  Non-COVID.

## 2021-11-13 NOTE — Patient Instructions (Signed)
Medication Instructions:   Your physician recommends that you continue on your current medications as directed. Please refer to the Current Medication list given to you today.  *If you need a refill on your cardiac medications before your next appointment, please call your pharmacy*   Testing/Procedures:  Your physician has requested that you have an echocardiogram. Echocardiography is a painless test that uses sound waves to create images of your heart. It provides your doctor with information about the size and shape of your heart and how well your heart's chambers and valves are working. This procedure takes approximately one hour. There are no restrictions for this procedure.    Follow-Up: At [facility], you and your health needs are our priority.  As part of our continuing mission to provide you with exceptional heart care, we have created designated Provider Care Teams.  These Care Teams include your primary Cardiologist (physician) and Advanced Practice Providers (APPs -  Physician Assistants and Nurse Practitioners) who all work together to provide you with the care you need, when you need it.  We recommend signing up for the patient portal called "MyChart".  Sign up information is provided on this After Visit Summary.  MyChart is used to connect with patients for Virtual Visits (Telemedicine).  Patients are able to view lab/test results, encounter notes, upcoming appointments, etc.  Non-urgent messages can be sent to your provider as well.   To learn more about what you can do with MyChart, go to [email or web address].    Your next appointment:   1 year(s)  The format for your next appointment:   In Person  Provider:   Candee Furbish, MD

## 2021-11-13 NOTE — Assessment & Plan Note (Signed)
Not interested in statin.  Discussed.

## 2021-11-13 NOTE — Progress Notes (Signed)
Cardiology Office Note:    Date:  11/13/2021   ID:  HOLLIN CREWE, DOB Dec 01, 1946, MRN [id number]  PCP:  Hoyt Koch, MD  [facility] Cardiologist:  Candee Furbish, MD  [facility] Electrophysiologist:  None   Referring MD: Hoyt Koch, *     History of Present Illness:    Rachel Gardner is a 75 y.o. female here for the follow-up of coronary artery calcification dilated aortic root aortic atherosclerosis.  4.4 cm previously described but upon closer inspection 4 cm on 01/25/20 CT scan.  Left lower lobe nodule 7 mm stable.  Extensive coronary calcification also seen on CT.  Nuclear stress test was reassuring in 2017 when this was discovered with prior exertional tiredness and neck tightness.  2013 had BPPV in [place]. Has a cochlear implant. Has had extensive neurologic workup as well as all reassuring.    Statin intolerance- Lipitor, crestor, pravastatin, zocor,  - muscle pain. LDL 115. Stopped taking Zetia, trying to eat- celery, juice.    Quit smoking 12/2015. Retired Marine scientist. She enjoyed traveling from [place] to [name], [place], [place] for her 32 birthday.  At last visit, had breast pain and was anxious with this. Reported stomach discomfort. Took PPI, pantoprazole and felt better.  She planned to travel to [place] to help a friend out for 2 weeks.  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  She has also been to [place].  Today, she is doing well.   On 10/22, she was staying with her granddaughter after arriving from [place]. On 10/25, she reports a burning pain along her back localized to between her shoulders with associated cough. Her blood pressure initially was 170/114 and her oxygen saturation was 93. She used Flonase and gargled water to try and relieve the cough. The next day, she was feeling better and her blood pressure went down to 130/96. A few days later, she went to see her PCP.   She reports being in bed for 4 days  with a bag by her bed for the coughing due to the infection. The coughing and infection has since resolved. However, she endorses fatigue ever since she became sick. Recently, she has needed to lie down in the middle of the day because of lack of energy. The fatigue also has associated shortness of breath.  Of note, she quit smoking. While she was in [place], she went to see several specialists like a dentist because the service was cheaper. She also wants to find a new PCP because she is not satisfied with her current PCP.   She denies any palpitations. No lightheadedness, headaches, syncope, orthopnea, PND, lower extremity edema or exertional symptoms.   Past Medical History:  Diagnosis Date   Allergy    seasonal allergies   Ascending aortic aneurysm    Blood in stool    BPPV (benign paroxysmal positional vertigo)    Cataract    bilateral-sx   COPD (chronic obstructive pulmonary disease) ([place]) 08/06/2021   CPAP (continuous positive airway pressure) dependence 02/09/2019   Diverticulitis    Fatty liver 05/28/10   per [facility] at [facility]   Genital herpes    GERD (gastroesophageal reflux disease)    on meds   Glaucoma    eye drops for tx   Helicobacter pylori infection    Hiatal hernia 05/28/10   CT @ [facility]   Hyperlipidemia    on meds   Hypertension  on meds   Hypothyroidism    on meds   Incontinence of urine in female    Kidney stones    Lung nodule seen on imaging study    Meniere's disease    Obstructive sleep apnea    OSA on CPAP 04/13/2019   Moderate with AHI 20/hr now on CPAP at 13cm H2O   Sleep apnea    uses CPAP    Past Surgical History:  Procedure Laterality Date   ABDOMINAL HYSTERECTOMY  1995   ANAL RECTAL MANOMETRY N/A 08/06/2016   Procedure: ANO RECTAL MANOMETRY;  Surgeon: Doran Stabler, MD;  Location: [facility];  Service: Gastroenterology;  Laterality: N/A;   APPENDECTOMY  1989   BPPV     x 4 after L cochlear implant   BREAST  SURGERY  1989   breast reduction   CATARACT EXTRACTION, BILATERAL     CHOLECYSTECTOMY  1989   COCHLEAR IMPLANT     Left/2009; Right/2012   COLONOSCOPY  2012   normal   LAPAROSCOPIC SIGMOID COLECTOMY  2012   with takedown of splenic flexure; cystoscopy with bilateral ureteral stent placement   REDUCTION MAMMAPLASTY Bilateral 1989   [place]   TOTAL THYROIDECTOMY  1995   WISDOM TOOTH EXTRACTION      Current Medications: Current Meds  Medication Sig   ascorbic acid (VITAMIN C) 1000 MG tablet Take 500 mg by mouth daily.    aspirin 81 MG chewable tablet Chew 81 mg by mouth daily.    azithromycin (ZITHROMAX) 250 MG tablet Take 1 tablet (250 mg total) by mouth daily.   bimatoprost (LUMIGAN) 0.01 % SOLN Place 1 drop into both eyes at bedtime.   BINAXNOW COVID-19 AG HOME TEST KIT TEST AS DIRECTED TODAY   CALCIUM PO Take 1 tablet by mouth daily.   Cholecalciferol (VITAMIN D3 PO) Take 1 tablet by mouth daily.   Cyanocobalamin (VITAMIN B-12 PO) Take 1 tablet by mouth daily.   fluticasone (FLONASE) 50 MCG/ACT nasal spray Place 2 sprays into both nostrils at bedtime.   hydrochlorothiazide (HYDRODIURIL) 25 MG tablet Take 1 tablet (25 mg total) by mouth daily.   ID NOW COVID-19 KIT TEST AS DIRECTED TODAY   levothyroxine (SYNTHROID) 88 MCG tablet Take 1 tablet (88 mcg total) by mouth daily.   losartan (COZAAR) 100 MG tablet Take 1 tablet (100 mg total) by mouth daily.   meclizine (ANTIVERT) 25 MG tablet Take 1 tablet (25 mg total) by mouth 3 (three) times daily as needed for dizziness.   pantoprazole (PROTONIX) 40 MG tablet TAKE 1 TABLET(40 MG) BY MOUTH TWICE DAILY   Probiotic Product (PROBIOTIC ADVANCED PO) Take 1 capsule by mouth daily.   promethazine-dextromethorphan (PROMETHAZINE-DM) 6.25-15 MG/5ML syrup Take 5 mLs by mouth 4 (four) times daily as needed for cough.   umeclidinium-vilanterol (ANORO ELLIPTA) 62.5-25 MCG/ACT AEPB Inhale 1 puff into the lungs daily.     Allergies:   Morphine,  Morphine and related, Statins, Tape, Wellbutrin [bupropion], and Denture adhesive   Social History   Socioeconomic History   Marital status: Divorced    Spouse name: Not on file   Number of children: 3   Years of education: Not on file   Highest education level: Not on file  Occupational History   Occupation: retired Marine scientist  Tobacco Use   Smoking status: Former    Packs/day: 0.50    Years: 41.00    Pack years: 20.50    Types: Cigarettes    Quit date: 07/26/2021  Years since quitting: 0.3   Smokeless tobacco: Never   Tobacco comments:    Quit 4 months ago, 1/2 pack a day [facility] 11/01/21  Vaping Use   Vaping Use: Never used  Substance and Sexual Activity   Alcohol use: Yes    Alcohol/week: 0.0 standard drinks    Comment: very rare   Drug use: No   Sexual activity: Never  Other Topics Concern   Not on file  Social History Narrative   Regular exercise: walk; 1 mile a day / walk 5 miles weekend;    update 02/22/2020 not exercising   Caffeine use: 1-2 cups of coffee per day   3 children   Lives alone with her dogs, son lives next door   Social Determinants of Health   Financial Resource Strain: Not on file  Food Insecurity: Not on file  Transportation Needs: Not on file  Physical Activity: Not on file  Stress: Not on file  Social Connections: Not on file     Family History: The patient's family history includes Colon cancer in her cousin and maternal aunt; Hyperlipidemia in her brother; Hypertension in her father, mother, and sister; Prostate cancer in her brother and paternal uncle; Thyroid disease in her sister. There is no history of Colon polyps, Esophageal cancer, Stomach cancer, or Rectal cancer.  ROS:   Please see the history of present illness.    (+) Fatigue (+) Shortness of breath (+) Back pain  All other systems reviewed and are negative.  EKGs/Labs/Other Studies Reviewed:    The following studies were reviewed today: CT Chest 01/16/21 1. Aortic  atherosclerosis, in addition to left main and 3 vessel coronary artery disease, as well as similar ectasia of ascending thoracic aorta (4.2 cm in diameter). Recommend annual imaging followup by CTA or MRA. This recommendation follows 2010 ACCF/AHA/AATS/ACR/ASA/SCA/SCAI/SIR/STS/SVM Guidelines for the Diagnosis and Management of Patients with Thoracic Aortic Disease. Circulation. 2010; 121: I338-S505. Aortic aneurysm NOS (ICD10-I71.9) 2. 9 mm smoothly marginated left lower lobe pulmonary nodule, stable compared to prior examinations, considered definitively benign. 3. Colonic diverticulosis. Aortic Atherosclerosis (ICD10-I70.0).  CT Chest 01/25/20 1. Stable 4.1 cm ascending thoracic aortic aneurysm. Recommend annual imaging followup by CTA or MRA. This recommendation follows 2010 ACCF/AHA/AATS/ACR/ASA/SCA/SCAI/SIR/STS/SVM Guidelines for the Diagnosis and Management of Patients with Thoracic Aortic Disease. Circulation. 2010; 121: e266-e369 2. 9 mm left lower lobe pulmonary nodule, minimally increased since 2016, probably benign. 3. Coronary calcifications. The severity of coronary artery disease and any potential stenosis cannot be assessed on this non-gated CT examination.  Nuclear stress test 11/02/2019 Nuclear stress EF: 67%. There were no wall motion abnormalities The left ventricular ejection fraction is normal (55-65%). There was no ST segment deviation noted during stress. The study is normal. This is a low risk study  EKG:  EKG was personally reviewed 11/13/21: Sinus rhythm, rate 90 bpm  11/12/20: sinus rhythm 92 no ischemia  Recent Labs: 08/06/2021: ALT 12; BUN 24; Creatinine, Ser 0.91; Hemoglobin 15.0; Platelets 206.0; Potassium 3.6; Sodium 139; TSH 0.29  Recent Lipid Panel    Component Value Date/Time   CHOL 216 (H) 12/17/2020 0830   CHOL 222 (H) 12/16/2016 0826   TRIG 191.0 (H) 12/17/2020 0830   HDL 39.00 (L) 12/17/2020 0830   HDL 48 12/16/2016 0826   CHOLHDL 6  12/17/2020 0830   VLDL 38.2 12/17/2020 0830   LDLCALC 138 (H) 12/17/2020 0830   LDLCALC 138 (H) 12/16/2016 0826   LDLDIRECT 166.0 02/07/2019 0907     Risk  Assessment/Calculations:       Physical Exam:    VS:  BP 100/60 (BP Location: Left Arm, Patient Position: Sitting, Cuff Size: Normal)   Pulse 90   Ht 5' 2"  (1.575 m)   Wt 137 lb (62.1 kg)   SpO2 94%   BMI 25.06 kg/m     Wt Readings from Last 3 Encounters:  11/13/21 137 lb (62.1 kg)  11/01/21 138 lb 3.2 oz (62.7 kg)  10/25/21 136 lb 6.4 oz (61.9 kg)     GEN:  Well nourished, well developed in no acute distress HEENT: Normal NECK: No JVD; No carotid bruits LYMPHATICS: No lymphadenopathy CARDIAC: RRR, no murmurs, rubs, gallops RESPIRATORY:  Clear to auscultation without rales, wheezing or rhonchi  ABDOMEN: Soft, non-tender, non-distended MUSCULOSKELETAL:  No edema; No deformity  SKIN: Warm and dry NEUROLOGIC:  Alert and oriented x 3 PSYCHIATRIC:  Normal affect   ASSESSMENT:    1. SOB (shortness of breath)   2. Aneurysm of ascending aorta without rupture   3. Aortic atherosclerosis ([place])   4. Coronary artery calcification   5. Mixed hyperlipidemia   6. Shortness of breath     PLAN:    Thoracic ascending aortic aneurysm (H[facility]) 4.2 cm stable.  CT scan 01/25/2020.  01/16/2021.  Aortic atherosclerosis ([facility]) Encourage statin therapy in the past.  She is not interested.  Coronary artery calcification Continue to encourage lifestyle modification.  Not interested in statin.  Nuclear stress test 2020 reassuring.  Mixed hyperlipidemia Not interested in statin.  Discussed.  Shortness of breath Checking echocardiogram.  She has felt poor after her viral episode.  Non-COVID.  Shared Decision Making/Informed Consent        Medication Adjustments/Labs and Tests Ordered: Current medicines are reviewed at length with the patient today.  Concerns regarding medicines are outlined above.  Orders Placed This Encounter   Procedures   EKG 12-Lead   ECHOCARDIOGRAM COMPLETE    No orders of the defined types were placed in this encounter.  Patient Instructions  Medication Instructions:   Your physician recommends that you continue on your current medications as directed. Please refer to the Current Medication list given to you today.  *If you need a refill on your cardiac medications before your next appointment, please call your pharmacy*   Testing/Procedures:  Your physician has requested that you have an echocardiogram. Echocardiography is a painless test that uses sound waves to create images of your heart. It provides your doctor with information about the size and shape of your heart and how well your heart's chambers and valves are working. This procedure takes approximately one hour. There are no restrictions for this procedure.    Follow-Up: At [facility], you and your health needs are our priority.  As part of our continuing mission to provide you with exceptional heart care, we have created designated Provider Care Teams.  These Care Teams include your primary Cardiologist (physician) and Advanced Practice Providers (APPs -  Physician Assistants and Nurse Practitioners) who all work together to provide you with the care you need, when you need it.  We recommend signing up for the patient portal called "MyChart".  Sign up information is provided on this After Visit Summary.  MyChart is used to connect with patients for Virtual Visits (Telemedicine).  Patients are able to view lab/test results, encounter notes, upcoming appointments, etc.  Non-urgent messages can be sent to your provider as well.   To learn more about what you can do with MyChart,  go to [email or web address].    Your next appointment:   1 year(s)  The format for your next appointment:   In Person  Provider:   Candee Furbish, MD        Wilhemina Bonito as a scribe for Candee Furbish, MD.,have documented all  relevant documentation on the behalf of Candee Furbish, MD,as directed by  Candee Furbish, MD while in the presence of Candee Furbish, MD.  I, Candee Furbish, MD, have reviewed all documentation for this visit. The documentation on 11/13/21 for the exam, diagnosis, procedures, and orders are all accurate and complete.   Signed, Candee Furbish, MD  11/13/2021 1:57 PM    [facility]

## 2021-11-14 DIAGNOSIS — M6281 Muscle weakness (generalized): Secondary | ICD-10-CM | POA: Diagnosis not present

## 2021-11-14 DIAGNOSIS — R2689 Other abnormalities of gait and mobility: Secondary | ICD-10-CM | POA: Diagnosis not present

## 2021-11-14 DIAGNOSIS — M5459 Other low back pain: Secondary | ICD-10-CM | POA: Diagnosis not present

## 2021-11-14 DIAGNOSIS — M545 Low back pain, unspecified: Secondary | ICD-10-CM | POA: Diagnosis not present

## 2021-11-15 LAB — ALPHA-1 ANTITRYPSIN PHENOTYPE: A-1 Antitrypsin, Ser: 174 mg/dL (ref 83–199)

## 2021-11-18 DIAGNOSIS — M545 Low back pain, unspecified: Secondary | ICD-10-CM | POA: Diagnosis not present

## 2021-11-18 DIAGNOSIS — M5459 Other low back pain: Secondary | ICD-10-CM | POA: Diagnosis not present

## 2021-11-18 DIAGNOSIS — M6281 Muscle weakness (generalized): Secondary | ICD-10-CM | POA: Diagnosis not present

## 2021-11-18 DIAGNOSIS — R2689 Other abnormalities of gait and mobility: Secondary | ICD-10-CM | POA: Diagnosis not present

## 2021-11-20 DIAGNOSIS — R2689 Other abnormalities of gait and mobility: Secondary | ICD-10-CM | POA: Diagnosis not present

## 2021-11-20 DIAGNOSIS — M6281 Muscle weakness (generalized): Secondary | ICD-10-CM | POA: Diagnosis not present

## 2021-11-20 DIAGNOSIS — M545 Low back pain, unspecified: Secondary | ICD-10-CM | POA: Diagnosis not present

## 2021-11-20 DIAGNOSIS — M5459 Other low back pain: Secondary | ICD-10-CM | POA: Diagnosis not present

## 2021-11-25 DIAGNOSIS — M5459 Other low back pain: Secondary | ICD-10-CM | POA: Diagnosis not present

## 2021-11-25 DIAGNOSIS — R2689 Other abnormalities of gait and mobility: Secondary | ICD-10-CM | POA: Diagnosis not present

## 2021-11-25 DIAGNOSIS — M545 Low back pain, unspecified: Secondary | ICD-10-CM | POA: Diagnosis not present

## 2021-11-25 DIAGNOSIS — M6281 Muscle weakness (generalized): Secondary | ICD-10-CM | POA: Diagnosis not present

## 2021-11-27 DIAGNOSIS — M545 Low back pain, unspecified: Secondary | ICD-10-CM | POA: Diagnosis not present

## 2021-11-27 DIAGNOSIS — R2689 Other abnormalities of gait and mobility: Secondary | ICD-10-CM | POA: Diagnosis not present

## 2021-11-27 DIAGNOSIS — M5459 Other low back pain: Secondary | ICD-10-CM | POA: Diagnosis not present

## 2021-11-27 DIAGNOSIS — M6281 Muscle weakness (generalized): Secondary | ICD-10-CM | POA: Diagnosis not present

## 2021-11-29 DIAGNOSIS — M6281 Muscle weakness (generalized): Secondary | ICD-10-CM | POA: Diagnosis not present

## 2021-11-29 DIAGNOSIS — M545 Low back pain, unspecified: Secondary | ICD-10-CM | POA: Diagnosis not present

## 2021-11-29 DIAGNOSIS — R2689 Other abnormalities of gait and mobility: Secondary | ICD-10-CM | POA: Diagnosis not present

## 2021-11-29 DIAGNOSIS — M5459 Other low back pain: Secondary | ICD-10-CM | POA: Diagnosis not present

## 2021-12-02 ENCOUNTER — Other Ambulatory Visit (HOSPITAL_COMMUNITY): Payer: Medicare Other

## 2021-12-03 ENCOUNTER — Ambulatory Visit (HOSPITAL_COMMUNITY): Payer: Medicare Other | Attending: Cardiology

## 2021-12-03 ENCOUNTER — Other Ambulatory Visit: Payer: Self-pay | Admitting: *Deleted

## 2021-12-03 ENCOUNTER — Other Ambulatory Visit: Payer: Self-pay

## 2021-12-03 DIAGNOSIS — R0602 Shortness of breath: Secondary | ICD-10-CM | POA: Diagnosis not present

## 2021-12-03 DIAGNOSIS — I712 Thoracic aortic aneurysm, without rupture, unspecified: Secondary | ICD-10-CM

## 2021-12-03 LAB — ECHOCARDIOGRAM COMPLETE
Area-P 1/2: 2.71 cm2
S' Lateral: 1.85 cm

## 2021-12-04 DIAGNOSIS — R2689 Other abnormalities of gait and mobility: Secondary | ICD-10-CM | POA: Diagnosis not present

## 2021-12-04 DIAGNOSIS — M545 Low back pain, unspecified: Secondary | ICD-10-CM | POA: Diagnosis not present

## 2021-12-04 DIAGNOSIS — M6281 Muscle weakness (generalized): Secondary | ICD-10-CM | POA: Diagnosis not present

## 2021-12-04 DIAGNOSIS — M5459 Other low back pain: Secondary | ICD-10-CM | POA: Diagnosis not present

## 2021-12-05 ENCOUNTER — Other Ambulatory Visit: Payer: Self-pay | Admitting: *Deleted

## 2021-12-05 DIAGNOSIS — I10 Essential (primary) hypertension: Secondary | ICD-10-CM

## 2021-12-05 DIAGNOSIS — I77810 Thoracic aortic ectasia: Secondary | ICD-10-CM

## 2021-12-05 NOTE — Progress Notes (Signed)
Ejection fraction normal, hyperdynamic.  Reassuring.  Ascending aorta 40 mm which is same as prior CT scan.   Recommend repeating echocardiogram in 1 year to make sure that there is no change in ascending aorta dimensions.   Overall reassuring echocardiogram from a pump standpoint.   Candee Furbish, MD   Order placed to repeat Echo in 1 year.  Pt has reviewed results and Dr Marlou Porch comments/orders via [employer]

## 2021-12-09 ENCOUNTER — Other Ambulatory Visit: Payer: Self-pay

## 2021-12-09 ENCOUNTER — Encounter: Payer: Self-pay | Admitting: Obstetrics and Gynecology

## 2021-12-09 ENCOUNTER — Other Ambulatory Visit (HOSPITAL_COMMUNITY)
Admission: RE | Admit: 2021-12-09 | Discharge: 2021-12-09 | Disposition: A | Discharge status: Home / Self Care | Payer: Medicare Other | Source: Ambulatory Visit | Attending: Obstetrics and Gynecology | Admitting: Obstetrics and Gynecology

## 2021-12-09 ENCOUNTER — Ambulatory Visit: Payer: Medicare Other | Admitting: Obstetrics and Gynecology

## 2021-12-09 VITALS — BP 122/76 | HR 86 | Ht 61.0 in | Wt 131.7 lb

## 2021-12-09 DIAGNOSIS — R350 Frequency of micturition: Secondary | ICD-10-CM

## 2021-12-09 DIAGNOSIS — R319 Hematuria, unspecified: Secondary | ICD-10-CM

## 2021-12-09 DIAGNOSIS — N3281 Overactive bladder: Secondary | ICD-10-CM

## 2021-12-09 DIAGNOSIS — N393 Stress incontinence (female) (male): Secondary | ICD-10-CM

## 2021-12-09 DIAGNOSIS — R159 Full incontinence of feces: Secondary | ICD-10-CM

## 2021-12-09 LAB — POCT URINALYSIS DIPSTICK
Appearance: NORMAL
Bilirubin, UA: NEGATIVE
Glucose, UA: NEGATIVE
Ketones, UA: NEGATIVE
Leukocytes, UA: NEGATIVE
Nitrite, UA: NEGATIVE
Protein, UA: NEGATIVE
Spec Grav, UA: 1.025 (ref 1.010–1.025)
Urobilinogen, UA: 0.2 E.U./dL
pH, UA: 5.5 (ref 5.0–8.0)

## 2021-12-09 MED ORDER — MIRABEGRON ER 50 MG PO TB24: 50.0000 mg | ORAL_TABLET | Freq: Every day | ORAL | 5 refills | Status: DC

## 2021-12-09 NOTE — Patient Instructions (Addendum)
Today we talked about ways to manage bladder urgency such as altering your diet to avoid irritative beverages and foods (bladder diet) as well as attempting to decrease stress and other exacerbating factors.    The Most Bothersome Foods* The Least Bothersome Foods*  Coffee - Regular & Decaf Tea - caffeinated Carbonated beverages - cola, non-colas, diet & caffeine-free Alcohols - Beer, Red Wine, White Wine, Champagne Fruits - Grapefruit, [place], Orange, [employer] - Cranberry, Grapefruit, Orange, Pineapple Vegetables - Tomato & Tomato Products Flavor Enhancers - Hot peppers, Spicy foods, Chili, Horseradish, Vinegar, Monosodium glutamate (MSG) Artificial Sweeteners - NutraSweet, Sweet 'N Low, Equal (sweetener), Saccharin Ethnic foods - [place], [place], [place] food [employer] - low-fat & whole Fruits - Bananas, Blueberries, [place]w melon, Pears, Raisins, Watermelon Vegetables - Broccoli, Brussels Sprouts, [place], Carrots, Cauliflower, [place], Cucumber, Mushrooms, Peas, Radishes, Squash, Zucchini, White potatoes, Sweet potatoes & yams Poultry - Chicken, Eggs, [place], [employer] - Beef, Programmer, multimedia, Lamb Seafood - Shrimp, [place] fish, Salmon Grains - Oat, Rice Snacks - Pretzels, Popcorn  *Lissa Morales et al. Diet and its role in interstitial cystitis/bladder pain syndrome (IC/BPS) and comorbid conditions. [place] 2012 Jan 11.   Accidental Bowel Leakage: Our goal is to achieve formed bowel movements daily or every-other-day without leakage.  You may need to try different combinations of the following options to find what works best for you.  Some management options include: Dietary changes (more leafy greens, vegetables and fruits; less processed foods) Fiber supplementation (Metamucil or something with psyllium as active ingredient) Over-the-counter imodium (tablets or liquid) to help solidify the stool and prevent leakage of stool.

## 2021-12-09 NOTE — Progress Notes (Signed)
[facility] New Patient Evaluation and Consultation  Referring Provider: Hoyt Gardner, * PCP: Rachel Koch, MD Date of Service: 12/09/2021  SUBJECTIVE Chief Complaint: New Patient (Initial Visit) - urinary and fecal incontinence  History of Present Illness: Rachel Gardner is a 75 y.o. White or Caucasian female seen in consultation at the request of Dr. Sharlet Gardner for evaluation of incontinence.    Review of records significant for: - Previously tried pelvic floor physical therapy  -Records reviewed from Urologist in [place] (scanned) which had Urodynamic testing. This showed stress incontinence with ISD and overactive bladder.   Urinary Symptoms: Leaks urine with cough/ sneeze, going from sitting to standing, and without sensation Leaks 4-5 time(s) per day.  Pad use: 5-7 adult diapers per day.   She is bothered by her UI symptoms. Has attended pelvic PT. Was prescribed myrbetriq 50mg  by a Urologist in [place] but did not take it because she would not have a return appt with him. Has glaucoma so cannot take anticholinergics.   Day time voids 10-12.  Nocturia: 1-2 times per night to void. Voiding dysfunction: she empties her bladder well.  does not use a catheter to empty bladder.  When urinating, she feels dribbling after finishing Drinks: 2 cups coffee, iced tea, otherwise water per day  UTIs:  0  UTI's in the last year.   Denies history of blood in urine and kidney or bladder stones  Pelvic Organ Prolapse Symptoms:                  She Denies a feeling of a bulge the vaginal area.   Bowel Symptom: Bowel movements: daily- gets urge and has to manually hold to not leak urine.  Stool consistency: soft  Straining: yes.  Splinting: yes.  Incomplete evacuation: yes.  She Admits to accidental bowel leakage / fecal incontinence  Occurs: when she gets a strong urge  Consistency with leakage: soft  Bowel regimen: fiber Last colonoscopy: Date 2022,  Results- diverticula and polyps removed   Sexual Function Sexually active: no.    Pelvic Pain Denies pelvic pain    Past Medical History:  Past Medical History:  Diagnosis Date   Allergy    seasonal allergies   Ascending aortic aneurysm    Blood in stool    BPPV (benign paroxysmal positional vertigo)    Cataract    bilateral-sx   COPD (chronic obstructive pulmonary disease) ([place]) 08/06/2021   CPAP (continuous positive airway pressure) dependence 02/09/2019   Diverticulitis    Fatty liver 05/28/10   per [facility] at [facility]   Genital herpes    GERD (gastroesophageal reflux disease)    on meds   Glaucoma    eye drops for tx   Helicobacter pylori infection    Hiatal hernia 05/28/10   CT @ [facility]   Hyperlipidemia    on meds   Hypertension    on meds   Hypothyroidism    on meds   Incontinence of urine in female    Kidney stones    Lung nodule seen on imaging study    Meniere's disease    Obstructive sleep apnea    OSA on CPAP 04/13/2019   Moderate with AHI 20/hr now on CPAP at 13cm H2O   Sleep apnea    uses CPAP     Past Surgical History:   Past Surgical History:  Procedure Laterality Date   ABDOMINAL HYSTERECTOMY  1995   ANAL RECTAL MANOMETRY  N/A 08/06/2016   Procedure: ANO RECTAL MANOMETRY;  Surgeon: Doran Stabler, MD;  Location: [facility];  Service: Gastroenterology;  Laterality: N/A;   APPENDECTOMY  1989   BPPV     x 4 after L cochlear implant   BREAST SURGERY  1989   breast reduction   CATARACT EXTRACTION, BILATERAL     CHOLECYSTECTOMY  1989   COCHLEAR IMPLANT     Left/2009; Right/2012   COLONOSCOPY  2012   normal   LAPAROSCOPIC SIGMOID COLECTOMY  2012   with takedown of splenic flexure; cystoscopy with bilateral ureteral stent placement   REDUCTION MAMMAPLASTY Bilateral 1989   [place]   TOTAL THYROIDECTOMY  1995   WISDOM TOOTH EXTRACTION       Past OB/GYN History: OB History  Gravida Para Term Preterm AB Living  3          3  SAB IAB Ectopic Multiple Live Births          3    # Outcome Date GA Lbr Len/2nd Weight Sex Delivery Anes PTL Lv  3 Gravida           2 Gravida           1 Gravida             Vaginal deliveries: 3,  Forceps/ Vacuum deliveries: 0, Cesarean section: 0 S/p hysterectomy   Medications: She has a current medication list which includes the following prescription(s): ascorbic acid, aspirin, bimatoprost, binaxnow covid-19 ag home test, calcium, cholecalciferol, cyanocobalamin, fluticasone, hydrochlorothiazide, id now covid-19, levothyroxine, losartan, meclizine, mirabegron er, pantoprazole, probiotic product, promethazine-dextromethorphan, and anoro ellipta.   Allergies: Patient is allergic to morphine, morphine and related, statins, tape, wellbutrin [bupropion], and denture adhesive.   Social History:  Social History   Tobacco Use   Smoking status: Former    Packs/day: 0.50    Years: 41.00    Pack years: 20.50    Types: Cigarettes    Quit date: 07/26/2021    Years since quitting: 0.3   Smokeless tobacco: Never   Tobacco comments:    Quit 4 months ago, 1/2 pack a day [facility] 11/01/21  Vaping Use   Vaping Use: Never used  Substance Use Topics   Alcohol use: Yes    Alcohol/week: 0.0 standard drinks    Comment: very rare   Drug use: No    Relationship status: single (has new boyfriend) She lives with alone.   She is not employed. Regular exercise: Yes:   History of abuse: No  Family History:   Family History  Problem Relation Age of Onset   Hypertension Mother    Hypertension Father    Hypertension Sister    Prostate cancer Brother    Hyperlipidemia Brother    Colon cancer Maternal Aunt    Prostate cancer Paternal Uncle    Colon cancer Cousin        x 2; paternal   Thyroid disease Sister    Colon polyps Neg Hx    Esophageal cancer Neg Hx    Stomach cancer Neg Hx    Rectal cancer Neg Hx      Review of Systems: Review of Systems  Constitutional:  Positive  for malaise/fatigue. Negative for fever and weight loss.  Respiratory:  Negative for cough, shortness of breath and wheezing.   Cardiovascular:  Negative for chest pain, palpitations and leg swelling.  Gastrointestinal:  Negative for abdominal pain and blood in stool.  Genitourinary:  Negative for dysuria.  Musculoskeletal:  Negative for myalgias.  Skin:  Negative for rash.  Neurological:  Negative for dizziness and headaches.  Endo/Heme/Allergies:  Does not bruise/bleed easily.  Psychiatric/Behavioral:  Negative for depression. The patient is not nervous/anxious.     OBJECTIVE Physical Exam: Vitals:   12/09/21 1421  BP: 122/76  Pulse: 86  Weight: 131 lb 11.2 oz (59.7 kg)  Height: 5\' 1"  (1.549 m)    Physical Exam Constitutional:      General: She is not in acute distress. Pulmonary:     Effort: Pulmonary effort is normal.  Abdominal:     General: There is no distension.     Palpations: Abdomen is soft.     Tenderness: There is no abdominal tenderness. There is no rebound.  Musculoskeletal:        General: No swelling. Normal range of motion.  Skin:    General: Skin is warm and dry.     Findings: No rash.  Neurological:     Mental Status: She is alert and oriented to person, place, and time.  Psychiatric:        Mood and Affect: Mood normal.        Behavior: Behavior normal.     GU / Detailed Urogynecologic Evaluation:  Pelvic Exam: Normal external female genitalia; Bartholin's and Skene's glands normal in appearance; urethral meatus normal in appearance, no urethral masses or discharge.   CST: negative   s/p hysterectomy: Speculum exam reveals normal vaginal mucosa with  atrophy and normal vaginal cuff.  Adnexa no mass, fullness, tenderness.    Pelvic floor strength III/V, puborectalis II/V external anal sphincter III/V  Pelvic floor musculature: Right levator non-tender, Right obturator non-tender, Left levator non-tender, Left obturator non-tender  POP-Q:    POP-Q  -2                                            Aa   -2                                           Ba  -7                                              C   2                                            Gh  4                                            Pb  7                                            tvl   -3  Ap  -3                                            Bp                                                 D     Rectal Exam:  Normal external rectum  Post-Void Residual (PVR) by Bladder Scan: In order to evaluate bladder emptying, we discussed obtaining a postvoid residual and she agreed to this procedure.  Procedure: The ultrasound unit was placed on the patient's abdomen in the suprapubic region after the patient had voided. A PVR of 12 ml was obtained by bladder scan.  Laboratory Results: POC urine: small blood   ASSESSMENT AND PLAN Ms. Kudo is a 75 y.o. with:  1. Overactive bladder   2. Urinary frequency   3. SUI (stress urinary incontinence, female)   4. Hematuria, unspecified type   5. Incontinence of feces, unspecified fecal incontinence type    OAB - We discussed the symptoms of overactive bladder (OAB), which include urinary urgency, urinary frequency, nocturia, with or without urge incontinence.  While we do not know the exact etiology of OAB, several treatment options exist. We discussed management including behavioral therapy (decreasing bladder irritants, urge suppression strategies, timed voids, bladder retraining), physical therapy, medication; for refractory cases posterior tibial nerve stimulation, sacral neuromodulation, and intravesical botulinum toxin injection.  - Prescribed Myrbetriq 50mg  daily. For Beta-3 agonist medication, we discussed the potential side effect of elevated blood pressure which is more likely to occur in individuals with uncontrolled hypertension. Unable to prescribe  anticholinergics due to history of glaucoma.   2. SUI - For treatment of stress urinary incontinence,  non-surgical options include expectant management, weight loss, physical therapy, as well as a pessary.  Surgical options include a midurethral sling, Burch urethropexy, and transurethral injection of a bulking agent. - She is not as bothered by SUI so will focus on urge incontinence first then reevaluate symptoms  3. Hematuria - blood in urine noted on POC urine. Will send for micro UA and culture to confirm.   4. Fecal incontinence - Treatment options include anti-diarrhea medication (loperamide/ Imodium OTC or prescription lomotil), fiber supplements, physical therapy, and possible sacral neuromodulation or surgery.   - She will start with metamucil daily for stool bulking.   Return 6 weeks for follow up  [name], MD   Medical Decision Making:  - Reviewed/ ordered a clinical laboratory test - Review and summation of prior records

## 2021-12-10 ENCOUNTER — Encounter: Payer: Self-pay | Admitting: Obstetrics and Gynecology

## 2021-12-10 DIAGNOSIS — R3129 Other microscopic hematuria: Secondary | ICD-10-CM

## 2021-12-10 LAB — URINALYSIS, ROUTINE W REFLEX MICROSCOPIC
Bilirubin Urine: NEGATIVE
Glucose, UA: NEGATIVE mg/dL
Ketones, ur: NEGATIVE mg/dL
Leukocytes,Ua: NEGATIVE
Nitrite: NEGATIVE
Protein, ur: NEGATIVE mg/dL
Specific Gravity, Urine: 1.025 (ref 1.005–1.030)
pH: 6 (ref 5.0–8.0)

## 2021-12-10 LAB — URINALYSIS, MICROSCOPIC (REFLEX)

## 2021-12-11 ENCOUNTER — Telehealth: Payer: Self-pay

## 2021-12-11 LAB — URINE CULTURE: Culture: NO GROWTH

## 2021-12-11 NOTE — Telephone Encounter (Signed)
Rachel Gardner, [facility] Phone Number: [phone]   I just received an additional results of the urine. I would like your evaluation and one guidelines that i should follow  [name]   ---  I called the patient but she could not hear. I will send a patient secure message.

## 2021-12-27 ENCOUNTER — Encounter: Payer: Medicare Other | Admitting: Internal Medicine

## 2022-01-03 NOTE — Progress Notes (Signed)
[employer] called [phone] to check authorization for a Cystoscopt and CT ABD and Pelvis w and w/o contrast PA not needed for both procedures Ref # [id number]

## 2022-01-06 ENCOUNTER — Other Ambulatory Visit: Payer: Self-pay | Admitting: Internal Medicine

## 2022-01-06 DIAGNOSIS — I10 Essential (primary) hypertension: Secondary | ICD-10-CM | POA: Diagnosis not present

## 2022-01-06 DIAGNOSIS — M4316 Spondylolisthesis, lumbar region: Secondary | ICD-10-CM | POA: Diagnosis not present

## 2022-01-06 DIAGNOSIS — Z6826 Body mass index (BMI) 26.0-26.9, adult: Secondary | ICD-10-CM | POA: Diagnosis not present

## 2022-01-06 DIAGNOSIS — Z1231 Encounter for screening mammogram for malignant neoplasm of breast: Secondary | ICD-10-CM

## 2022-01-07 ENCOUNTER — Other Ambulatory Visit: Payer: Self-pay

## 2022-01-07 ENCOUNTER — Encounter: Payer: Self-pay | Admitting: Internal Medicine

## 2022-01-07 ENCOUNTER — Ambulatory Visit: Payer: Medicare Other | Admitting: Internal Medicine

## 2022-01-07 ENCOUNTER — Ambulatory Visit (INDEPENDENT_AMBULATORY_CARE_PROVIDER_SITE_OTHER): Payer: Medicare Other | Admitting: Internal Medicine

## 2022-01-07 VITALS — BP 128/70 | HR 81 | Temp 98.0°F | Ht 61.0 in | Wt 138.0 lb

## 2022-01-07 DIAGNOSIS — Z23 Encounter for immunization: Secondary | ICD-10-CM | POA: Diagnosis not present

## 2022-01-07 DIAGNOSIS — Z87891 Personal history of nicotine dependence: Secondary | ICD-10-CM | POA: Diagnosis not present

## 2022-01-07 DIAGNOSIS — J439 Emphysema, unspecified: Secondary | ICD-10-CM

## 2022-01-07 DIAGNOSIS — J209 Acute bronchitis, unspecified: Secondary | ICD-10-CM | POA: Diagnosis not present

## 2022-01-07 LAB — PULMONARY FUNCTION TEST
DL/VA % pred: 79 %
DL/VA: 3.34 ml/min/mmHg/L
DLCO cor % pred: 75 %
DLCO cor: 12.97 ml/min/mmHg
DLCO unc % pred: 75 %
DLCO unc: 12.97 ml/min/mmHg
FEF 25-75 Post: 2.07 L/sec
FEF 25-75 Pre: 2.1 L/sec
FEF2575-%Change-Post: -1 %
FEF2575-%Pred-Post: 134 %
FEF2575-%Pred-Pre: 136 %
FEV1-%Change-Post: 1 %
FEV1-%Pred-Post: 101 %
FEV1-%Pred-Pre: 100 %
FEV1-Post: 1.89 L
FEV1-Pre: 1.87 L
FEV1FVC-%Change-Post: 1 %
FEV1FVC-%Pred-Pre: 111 %
FEV6-%Change-Post: 0 %
FEV6-%Pred-Post: 93 %
FEV6-%Pred-Pre: 94 %
FEV6-Post: 2.21 L
FEV6-Pre: 2.23 L
FEV6FVC-%Pred-Post: 105 %
FEV6FVC-%Pred-Pre: 105 %
FVC-%Change-Post: 0 %
FVC-%Pred-Post: 88 %
FVC-%Pred-Pre: 89 %
FVC-Post: 2.21 L
FVC-Pre: 2.23 L
Post FEV1/FVC ratio: 85 %
Post FEV6/FVC ratio: 100 %
Pre FEV1/FVC ratio: 84 %
Pre FEV6/FVC Ratio: 100 %
RV % pred: 110 %
RV: 2.34 L
TLC % pred: 101 %
TLC: 4.67 L

## 2022-01-07 MED ORDER — SPIRIVA RESPIMAT 2.5 MCG/ACT IN AERS: 2.0000 | INHALATION_SPRAY | Freq: Every day | RESPIRATORY_TRACT | 0 refills | Status: DC

## 2022-01-07 NOTE — Patient Instructions (Signed)
Full PFT performed today. °

## 2022-01-07 NOTE — Progress Notes (Signed)
Subjective:    Patient ID: Rachel Gardner, female    DOB: March 07, 1946, 76 y.o.   MRN: [id number]  IOV 02/03/2019    HPI  Chief Complaint  Patient presents with   Consult    Pt referred due to CT scan that was performed. Pt stated the CT showed that she had a lung nodule. Pt does have complaints of cough with clear mucus. Denies any complaints of SOB or chest tightness.   Rachel Gardner 76 y.o. -is a retired diabetes Investment banker, corporate.  She is from the state of [place] in [place].  She migrated to [place] in the 1990s.  She is a continued smoker.  She follows every year with Dr. Arvid Right for thoracic aorta aneurysm with a CT scan of the chest.  She says she is known to have a left lower lobe subpleural 8 mm nodule since the 1990s.  And the most recent CT chest January 2020 this was stable.  However radiologist reported a right subpleural 4 mm nodule by the minor fissure.  The radiologist also noted that this was stable since 2017.  Patient contends that this was not documented in the prior report which is true.  Therefore she is wondering how the radiologist can reported as being stable for the last 3 years.  I personally visualized the findings and confirmed the presence of the left lower lobe nodule and stability.  Also confirmed and demonstrated to her the current 4 mm nodule that was seen even in the prior scans but probably missed in the prior reports.  She wants to follow-up with me for the nodules once a year but after Dr. Mohammed Kindle does the CT scans for the thoracic aorta aneurysm.  In terms of her respiratory health she is overall asymptomatic.,  She travels widely across the world.  In 2019 she did 8 international trips including annual trips to [place].  She is now planning a trip to [place] because her daughter-in-law is running a marathon.    For the last few days she has a scratchy throat and a cough after picking up her respiratory illness from her grandchild.  Her son lives in  [place] and is a Geophysicist/field seismologist [name]).  She says he does not know about her visit to the clinic today.  In addition she had some abdominal symptoms and had CT abdomen and it is noted that she has some intra-abdominal lymphadenopathy that is slightly enlarged compared to 4 years earlier.  She has seen Dr. Loanne Drilling her endocrinologist who is referring her to a hematologist.  Noted she is never had pulmonary function testing before.  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  She uses hearing aids. Results for LADONNA, VANORDER (MRN [id number]) as of 02/03/2019 16:09  Ref. Range 01/14/2014 23:15 05/24/2014 17:18 03/06/2017 00:00 11/30/2018 13:35  Eosinophils Absolute Latest Ref Range: 0.0 - 0.7 K/uL 0.2 0.1  0.2    Review of Systems  Constitutional: Negative for fever and unexpected weight change.  HENT: Positive for hearing loss, postnasal drip and sinus pressure. Negative for congestion, dental problem, ear pain, nosebleeds, rhinorrhea, sneezing, sore throat and trouble swallowing.   Eyes: Negative for redness and itching.  Respiratory: Positive for cough. Negative for chest tightness, shortness of breath and wheezing.   Cardiovascular: Negative for palpitations and leg swelling.  Gastrointestinal: Negative for nausea and vomiting.  Genitourinary: Negative for dysuria.  Musculoskeletal: Negative for joint  swelling.  Skin: Negative for rash.  Allergic/Immunologic: Positive for environmental allergies. Negative for food allergies and immunocompromised state.  Neurological: Negative for headaches.  Hematological: Does not bruise/bleed easily.  Psychiatric/Behavioral: Negative for dysphoric mood. The patient is not nervous/anxious.      has a past medical history of Allergy, Ascending aortic aneurysm ([place]), Blood in stool, BPPV (benign paroxysmal positional vertigo), Diverticulitis, Fatty liver (05/28/10), Genital herpes, GERD (gastroesophageal reflux disease),  Glaucoma, Helicobacter pylori infection, Hiatal hernia (05/28/10), Hyperlipidemia, Hypertension, Hypothyroidism, Incontinence of urine in female, Kidney stones, Lung nodule seen on imaging study, Meniere's disease, and Obstructive sleep apnea.   reports that she has been smoking cigarettes. She has a 20.50 pack-year smoking history. She has never used smokeless tobacco.   OV 11/01/2021  Subjective:  Patient ID: Rachel Gardner, female , DOB: [birth year] , age 76 y.o. , MRN: [id number] , ADDRESS: [address] [place] [address] PCP Hoyt Koch, MD Patient Care Team: Hoyt Koch, MD as PCP - General (Internal Medicine) Jerline Pain, MD as PCP - Cardiology (Cardiology) Sueanne Margarita, MD as PCP - Sleep Medicine (Cardiology) Lafayette Dragon, MD (Inactive) as Consulting Physician (Gastroenterology) Suella Broad, MD as Consulting Physician (Physical Medicine and Rehabilitation) Estevan Ryder, MD as Referring Physician (Cardiology) Renato Shin, MD as Consulting Physician (Endocrinology) Vicie Mutters, MD as Consulting Physician (Otolaryngology)  This Provider for this visit: Treatment Team:  Attending Provider: Brand Males, MD    11/01/2021 -   Chief Complaint  Patient presents with   Follow-up    Patient has been having some congestion the week. Productive yellow cough worse at night.      HPI [name] 76 y.o. -last seen in February 2020.  After that not seen.  She returns now saying she quit smoking 4 months ago.  She says in August 2022 she had a chest x-ray and this showed emphysema and this really surprised her because in January 2022 she had a CT scan with Dr. Caffie Pinto cardiothoracic surgeon for aorta and emphysema was not reported [I personally visualized that CT scan right now and there is some emphysema].  She has chronic stable benign pulmonary nodules are also on the CT scan.  She says she went to [place] country in August/September 2022  after the chest x-ray.  And the pulmonologist that gave her Anoro.  She feels it is not fully helped her symptoms.  Nevertheless she quit.  She came back and then in the last week or so she got exposed to her grandchild with respiratory infection and then she has increased cough with some congestion and occasional yellow sputum.  There is no wheezing or chest tightness or shortness of breath.  2 years ago mention about getting pulmonary function test in the context of heavy smoking.  She wants to get this done.    CT Chest data JAn 2022  Narrative & Impression  CLINICAL DATA:  76 year old female with history of thoracic aortic aneurysm. Follow-up study.   EXAM: CT CHEST WITHOUT CONTRAST   TECHNIQUE: Multidetector CT imaging of the chest was performed following the standard protocol without IV contrast.   COMPARISON:  Chest CT 01/25/2020.   FINDINGS: Cardiovascular: Heart size is normal. There is no significant pericardial fluid, thickening or pericardial calcification. There is aortic atherosclerosis, as well as atherosclerosis of the great vessels of the mediastinum and the coronary arteries, including calcified atherosclerotic plaque in the left main, left anterior descending, left circumflex and  right coronary arteries. Ectasia of ascending thoracic aorta (4.2 cm in diameter), similar to the prior study.   Mediastinum/Nodes: No pathologically enlarged mediastinal or hilar lymph nodes. Please note that accurate exclusion of hilar adenopathy is limited on noncontrast CT scans. Esophagus is unremarkable in appearance. No axillary lymphadenopathy.   Lungs/Pleura: 9 mm smoothly marginated subpleural pulmonary nodule in the periphery of the left lower lobe (axial image 65 of series 8), stable compared to prior examinations dating back to at least December 31, 2017, considered definitively benign. No other suspicious appearing pulmonary nodules or masses are noted. Mild  linear scarring in the lung bases bilaterally. No acute consolidative airspace disease. No pleural effusions.   Upper Abdomen: Aortic atherosclerosis. Colonic diverticulosis noted in the region of the splenic flexure.   Musculoskeletal: There are no aggressive appearing lytic or blastic lesions noted in the visualized portions of the skeleton.   IMPRESSION: 1. Aortic atherosclerosis, in addition to left main and 3 vessel coronary artery disease, as well as similar ectasia of ascending thoracic aorta (4.2 cm in diameter). Recommend annual imaging followup by CTA or MRA. This recommendation follows 2010 ACCF/AHA/AATS/ACR/ASA/SCA/SCAI/SIR/STS/SVM Guidelines for the Diagnosis and Management of Patients with Thoracic Aortic Disease. Circulation. 2010; 121: F790-W409. Aortic aneurysm NOS (ICD10-I71.9) 2. 9 mm smoothly marginated left lower lobe pulmonary nodule, stable compared to prior examinations, considered definitively benign. 3. Colonic diverticulosis.   Aortic Atherosclerosis (ICD10-I70.0).     Electronically Signed   By: Vinnie Langton M.D.   On: 01/16/2021 11:50         OV 01/07/2022  Subjective:  Patient ID: Rachel Gardner, female , DOB: 06/12/1946 , age 19 y.o. , MRN: [id number] , ADDRESS: [address] [place] [address] PCP Hoyt Koch, MD Patient Care Team: Hoyt Koch, MD as PCP - General (Internal Medicine) Jerline Pain, MD as PCP - Cardiology (Cardiology) Sueanne Margarita, MD as PCP - Sleep Medicine (Cardiology) Lafayette Dragon, MD (Inactive) as Consulting Physician (Gastroenterology) Suella Broad, MD as Consulting Physician (Physical Medicine and Rehabilitation) Estevan Ryder, MD as Referring Physician (Cardiology) Renato Shin, MD as Consulting Physician (Endocrinology) Vicie Mutters, MD as Consulting Physician (Otolaryngology)  This Provider for this visit: Treatment Team:  Attending Provider: Brand Males,  MD    01/07/2022 -   Chief Complaint  Patient presents with   Follow-up    PFT performed today. Pt states she has been doing okay since last visit and denies any concerns with breathing.     HPI FRANCELY CRAW 75 y.o. -returns for follow-up.  In the setting of former smoking and trip related obstructive airways not otherwise specified flareup.  She continues to do well from a respiratory standpoint no problems.  She had pulmonary function test is essentially normal except for slight reduction in DLCO which would be consistent with my personal impression that she might have a slight amount of emphysema on the 2022 CT scan of the chest (based on my professional impression but not reported by radiology].  She has done well with Anoro.  She is wondering if she should still take it.  Her alpha-1 antitrypsin levels are normal and its MM phenotype.  In terms of her lung nodule she is going to have a CT scan of the chest later this month with Dr. Caffie Pinto.  She is interested in having a flu shot today  Her main issue right now is she does not have primary care physician and she is dealing with  back issues ever since her [place] trip.  She has seen Dr. Duffy Rhody of neurosurgery for this.  She has also become a grandmother for the 10th time and she showed me the pictures    CT Chest data  No results found.    PFT  PFT Results Latest Ref Rng & Units 01/07/2022  FVC-Pre L 2.23  FVC-Predicted Pre % 89  FVC-Post L 2.21  FVC-Predicted Post % 88  Pre FEV1/FVC % % 84  Post FEV1/FCV % % 85  FEV1-Pre L 1.87  FEV1-Predicted Pre % 100  FEV1-Post L 1.89  DLCO uncorrected ml/min/mmHg 12.97  DLCO UNC% % 75  DLCO corrected ml/min/mmHg 12.97  DLCO COR %Predicted % 75  DLVA Predicted % 79  TLC L 4.67  TLC % Predicted % 101  RV % Predicted % 110       has a past medical history of Allergy, Ascending aortic aneurysm, Blood in stool, BPPV (benign paroxysmal positional vertigo), Cataract, COPD  (chronic obstructive pulmonary disease) (HCC) (08/06/2021), CPAP (continuous positive airway pressure) dependence (02/09/2019), Diverticulitis, Fatty liver (05/28/10), Genital herpes, GERD (gastroesophageal reflux disease), Glaucoma, Helicobacter pylori infection, Hiatal hernia (05/28/10), Hyperlipidemia, Hypertension, Hypothyroidism, Incontinence of urine in female, Kidney stones, Lung nodule seen on imaging study, Meniere's disease, Obstructive sleep apnea, OSA on CPAP (04/13/2019), and Sleep apnea.   reports that she quit smoking about 5 months ago. Her smoking use included cigarettes. She has a 20.50 pack-year smoking history. She has never used smokeless tobacco.  Past Surgical History:  Procedure Laterality Date   ABDOMINAL HYSTERECTOMY  1995   ANAL RECTAL MANOMETRY N/A 08/06/2016   Procedure: ANO RECTAL MANOMETRY;  Surgeon: Doran Stabler, MD;  Location: [facility];  Service: Gastroenterology;  Laterality: N/A;   APPENDECTOMY  1989   BPPV     x 4 after L cochlear implant   BREAST SURGERY  1989   breast reduction   CATARACT EXTRACTION, BILATERAL     CHOLECYSTECTOMY  1989   COCHLEAR IMPLANT     Left/2009; Right/2012   COLONOSCOPY  2012   normal   LAPAROSCOPIC SIGMOID COLECTOMY  2012   with takedown of splenic flexure; cystoscopy with bilateral ureteral stent placement   REDUCTION MAMMAPLASTY Bilateral 1989   [place]   TOTAL THYROIDECTOMY  1995   WISDOM TOOTH EXTRACTION      Allergies  Allergen Reactions   Morphine Nausea And Vomiting   Morphine And Related     Nausea Can tolerate Dilaudid   Statins Other (See Comments)    Joint pain    Tape Other (See Comments)    Causes redness and will take skin when removed   Wellbutrin [Bupropion] Other (See Comments)    Suicidal intensions   Denture Adhesive Rash    Immunization History  Administered Date(s) Administered   Fluad Quad(high Dose 65+) 09/10/2019, 12/14/2020   Influenza, High Dose Seasonal PF 10/08/2017, 10/11/2018    Influenza,inj,Quad PF,6+ Mos 10/03/2013, 11/16/2014, 09/24/2015, 09/19/2016   Moderna Sars-Covid-2 Vaccination 02/10/2020, 03/10/2020, 11/01/2020   Pneumococcal Conjugate-13 10/03/2013   Pneumococcal Polysaccharide-23 04/28/2008, 09/24/2015   Td 08/09/2014    Family History  Problem Relation Age of Onset   Hypertension Mother    Hypertension Father    Hypertension Sister    Prostate cancer Brother    Hyperlipidemia Brother    Colon cancer Maternal Aunt    Prostate cancer Paternal Uncle    Colon cancer Cousin        x 2; paternal  Thyroid disease Sister    Colon polyps Neg Hx    Esophageal cancer Neg Hx    Stomach cancer Neg Hx    Rectal cancer Neg Hx      Current Outpatient Medications:    ascorbic acid (VITAMIN C) 1000 MG tablet, Take 500 mg by mouth daily. , Disp: , Rfl:    aspirin 81 MG chewable tablet, Chew 81 mg by mouth daily. , Disp: , Rfl:    bimatoprost (LUMIGAN) 0.01 % SOLN, Place 1 drop into both eyes at bedtime., Disp: , Rfl:    CALCIUM PO, Take 1 tablet by mouth daily., Disp: , Rfl:    Cholecalciferol (VITAMIN D3 PO), Take 1 tablet by mouth daily., Disp: , Rfl:    Cyanocobalamin (VITAMIN B-12 PO), Take 1 tablet by mouth daily., Disp: , Rfl:    fluticasone (FLONASE) 50 MCG/ACT nasal spray, Place 2 sprays into both nostrils at bedtime., Disp: , Rfl:    hydrochlorothiazide (HYDRODIURIL) 25 MG tablet, Take 1 tablet (25 mg total) by mouth daily., Disp: 90 tablet, Rfl: 3   levothyroxine (SYNTHROID) 88 MCG tablet, Take 1 tablet (88 mcg total) by mouth daily., Disp: 90 tablet, Rfl: 3   losartan (COZAAR) 100 MG tablet, Take 1 tablet (100 mg total) by mouth daily., Disp: 90 tablet, Rfl: 3   meclizine (ANTIVERT) 25 MG tablet, Take 1 tablet (25 mg total) by mouth 3 (three) times daily as needed for dizziness., Disp: 90 tablet, Rfl: 9   mirabegron ER (MYRBETRIQ) 50 MG TB24 tablet, Take 1 tablet (50 mg total) by mouth daily., Disp: 30 tablet, Rfl: 5   pantoprazole (PROTONIX) 40  MG tablet, TAKE 1 TABLET(40 MG) BY MOUTH TWICE DAILY, Disp: 180 tablet, Rfl: 1   Probiotic Product (PROBIOTIC ADVANCED PO), Take 1 capsule by mouth daily., Disp: , Rfl:    promethazine-dextromethorphan (PROMETHAZINE-DM) 6.25-15 MG/5ML syrup, Take 5 mLs by mouth 4 (four) times daily as needed for cough., Disp: 118 mL, Rfl: 0   Tiotropium Bromide Monohydrate (SPIRIVA RESPIMAT) 2.5 MCG/ACT AERS, Inhale 2 puffs into the lungs daily., Disp: 4 g, Rfl: 0   umeclidinium-vilanterol (ANORO ELLIPTA) 62.5-25 MCG/ACT AEPB, Inhale 1 puff into the lungs daily., Disp: 60 each, Rfl: 0      Objective:   Vitals:   01/07/22 1111  BP: 128/70  Pulse: 81  Temp: 98 F (36.7 C)  TempSrc: Oral  SpO2: 97%  Weight: 138 lb (62.6 kg)  Height: 5\' 1"  (1.549 m)    Estimated body mass index is 26.07 kg/m as calculated from the following:   Height as of this encounter: 5\' 1"  (1.549 m).   Weight as of this encounter: 138 lb (62.6 kg).  @WEIGHTCHANGE @  [employer]   01/07/22 1111  Weight: 138 lb (62.6 kg)     Physical Exam  General: No distress. Looks well Neuro: Alert and Oriented x 3. GCS 15. Speech normal Psych: Pleasant Resp:  Barrel Chest - no.  Wheeze - no, Crackles - no, No overt respiratory distress CVS: Normal heart sounds. Murmurs - no Ext: Stigmata of Connective Tissue Disease - no HEENT: Normal upper airway. PEERL +. No post nasal drip        Assessment:       ICD-10-CM   1. Pulmonary emphysema, unspecified emphysema type ([place])  J43.9     2. Stopped smoking with greater than 20 pack year history  Z87.891          Plan:     Patient  Instructions  Multiple lung nodules on CT  - cT chest jan 2021 per Dr Cyndia Bent -> jan 2022 stable without lung cancer   Plan  - CT Jan 2023 per Dr Cyndia Bent   Smoking history with emphysema on CT Jan 2022 and on CXR Aug 2022   Glad you are stable and well from breathing standpoint PFT is near normal - consistent with super mild trace emphysema  seen on CT 2022 Alpha 1 genetic test for copd is normal Glad you quit smoking  Plan  - change anoro to spiriva respimat 2 puff once daily (1.25 strength)  - use albuterol as needed - high dose flu shot 01/07/2022    Followup   - 6-9 months - if doing well look at reducing to albuterol as needed    SIGNATURE    Dr. Brand Males, M.D., F.C.C.P,  Pulmonary and Critical Care Medicine Staff Physician, [place] Director - Interstitial Lung Disease  Program  [facility] [place] [facility] [place], [place], [address]  Pager: [phone], If no answer or between  15:00h - 7:00h: call [phone] Telephone: [phone]  11:47 AM 01/07/2022

## 2022-01-07 NOTE — Progress Notes (Signed)
Full PFT performed today. °

## 2022-01-07 NOTE — Patient Instructions (Addendum)
Multiple lung nodules on CT  - cT chest jan 2021 per Dr Cyndia Bent -> jan 2022 stable without lung cancer   Plan  - CT Jan 2023 per Dr Cyndia Bent   Smoking history with emphysema on CT Jan 2022 and on CXR Aug 2022   Glad you are stable and well from breathing standpoint PFT is near normal - consistent with super mild trace emphysema seen on CT 2022 Alpha 1 genetic test for copd is normal Glad you quit smoking  Plan  - change anoro to spiriva respimat 2 puff once daily (1.25 strength)  - use albuterol as needed - high dose flu shot 01/07/2022    Followup   - 6-9 months - if doing well look at reducing to albuterol as needed

## 2022-01-07 NOTE — Addendum Note (Signed)
Addended by: Lorretta Harp on: 01/07/2022 03:14 PM   Modules accepted: Orders

## 2022-01-16 ENCOUNTER — Ambulatory Visit
Admission: RE | Admit: 2022-01-16 | Discharge: 2022-01-16 | Disposition: A | Discharge status: Home / Self Care | Payer: Medicare Other | Source: Ambulatory Visit | Attending: Obstetrics and Gynecology | Admitting: Obstetrics and Gynecology

## 2022-01-16 ENCOUNTER — Other Ambulatory Visit: Payer: Self-pay

## 2022-01-16 DIAGNOSIS — K5730 Diverticulosis of large intestine without perforation or abscess without bleeding: Secondary | ICD-10-CM | POA: Diagnosis not present

## 2022-01-16 DIAGNOSIS — N289 Disorder of kidney and ureter, unspecified: Secondary | ICD-10-CM | POA: Diagnosis not present

## 2022-01-16 DIAGNOSIS — R3129 Other microscopic hematuria: Secondary | ICD-10-CM | POA: Diagnosis not present

## 2022-01-16 DIAGNOSIS — R32 Unspecified urinary incontinence: Secondary | ICD-10-CM | POA: Diagnosis not present

## 2022-01-16 MED ORDER — IOPAMIDOL (ISOVUE-300) INJECTION 61%
100.0000 mL | Freq: Once | INTRAVENOUS | Status: AC | PRN
  Administered 2022-01-16: 100 mL via INTRAVENOUS

## 2022-01-21 ENCOUNTER — Other Ambulatory Visit: Payer: Medicare Other

## 2022-01-21 ENCOUNTER — Other Ambulatory Visit: Payer: Self-pay

## 2022-01-21 ENCOUNTER — Ambulatory Visit
Admission: RE | Admit: 2022-01-21 | Discharge: 2022-01-21 | Disposition: A | Discharge status: Home / Self Care | Payer: Medicare Other | Source: Ambulatory Visit | Attending: Surgery | Admitting: Surgery

## 2022-01-21 ENCOUNTER — Ambulatory Visit
Admission: RE | Admit: 2022-01-21 | Discharge: 2022-01-21 | Disposition: A | Discharge status: Home / Self Care | Payer: Medicare Other | Source: Ambulatory Visit | Attending: Internal Medicine | Admitting: Internal Medicine

## 2022-01-21 ENCOUNTER — Ambulatory Visit: Payer: Medicare Other | Admitting: Surgical

## 2022-01-21 VITALS — BP 148/93 | HR 98 | Resp 20 | Ht 61.0 in | Wt 137.8 lb

## 2022-01-21 DIAGNOSIS — Z1231 Encounter for screening mammogram for malignant neoplasm of breast: Secondary | ICD-10-CM | POA: Diagnosis not present

## 2022-01-21 DIAGNOSIS — I712 Thoracic aortic aneurysm, without rupture, unspecified: Secondary | ICD-10-CM

## 2022-01-21 DIAGNOSIS — I7121 Aneurysm of the ascending aorta, without rupture: Secondary | ICD-10-CM

## 2022-01-21 NOTE — Patient Instructions (Signed)
Emphasize lifestyle and medical management in particular for hypertension.

## 2022-01-21 NOTE — Progress Notes (Signed)
Subjective:    Patient ID: Rachel Gardner, female    DOB: 1946-11-08, 76 y.o.   MRN: [id number]  Chief Complaint  Patient presents with   Thoracic Aortic Aneurysm    Yearly f/u with CT chest    HPI Patient is in today for ongoing surveillance of her thoracic aneurysm.  Currently she reports that she is doing fairly well but has a lot of "stress" in her life.  She is also being worked up for some urological issues which include both bowel and bladder incontinence.  She denies chest pain, palpitations, shortness of breath and states that she functions pretty well.  She does a lot of travel.  She is known to have some emphysema having smoked for more than 40 years.  She has coronary calcifications and hypertension and is closely followed by cardiology.  Her most recent echocardiogram in 2022 showed no aortic valvular disease.  Past Medical History:  Diagnosis Date   Allergy    seasonal allergies   Ascending aortic aneurysm    Blood in stool    BPPV (benign paroxysmal positional vertigo)    Cataract    bilateral-sx   COPD (chronic obstructive pulmonary disease) ([place]) 08/06/2021   CPAP (continuous positive airway pressure) dependence 02/09/2019   Diverticulitis    Fatty liver 05/28/10   per CT at [facility]   Genital herpes    GERD (gastroesophageal reflux disease)    on meds   Glaucoma    eye drops for tx   Helicobacter pylori infection    Hiatal hernia 05/28/10   CT @ [facility]   Hyperlipidemia    on meds   Hypertension    on meds   Hypothyroidism    on meds   Incontinence of urine in female    Kidney stones    Lung nodule seen on imaging study    Meniere's disease    Obstructive sleep apnea    OSA on CPAP 04/13/2019   Moderate with AHI 20/hr now on CPAP at 13cm H2O   Sleep apnea    uses CPAP    Past Surgical History:  Procedure Laterality Date   ABDOMINAL HYSTERECTOMY  1995   ANAL RECTAL MANOMETRY N/A 08/06/2016   Procedure: ANO RECTAL  MANOMETRY;  Surgeon: Doran Stabler, MD;  Location: [facility];  Service: Gastroenterology;  Laterality: N/A;   APPENDECTOMY  1989   BPPV     x 4 after L cochlear implant   BREAST SURGERY  1989   breast reduction   CATARACT EXTRACTION, BILATERAL     CHOLECYSTECTOMY  1989   COCHLEAR IMPLANT     Left/2009; Right/2012   COLONOSCOPY  2012   normal   LAPAROSCOPIC SIGMOID COLECTOMY  2012   with takedown of splenic flexure; cystoscopy with bilateral ureteral stent placement   REDUCTION MAMMAPLASTY Bilateral 1989   [place]   TOTAL THYROIDECTOMY  1995   WISDOM TOOTH EXTRACTION      Family History  Problem Relation Age of Onset   Hypertension Mother    Hypertension Father    Hypertension Sister    Thyroid disease Sister    Colon cancer Maternal Aunt    Prostate cancer Paternal Uncle    Colon cancer Cousin        x 2; paternal   Prostate cancer Brother    Hyperlipidemia Brother    Colon polyps Neg Hx    Esophageal cancer Neg Hx    Stomach cancer  Neg Hx    Rectal cancer Neg Hx    Breast cancer Neg Hx     Social History   Socioeconomic History   Marital status: Divorced    Spouse name: Not on file   Number of children: 3   Years of education: Not on file   Highest education level: Not on file  Occupational History   Occupation: retired Marine scientist  Tobacco Use   Smoking status: Former    Packs/day: 0.50    Years: 41.00    Pack years: 20.50    Types: Cigarettes    Quit date: 07/26/2021    Years since quitting: 0.4   Smokeless tobacco: Never   Tobacco comments:    Quit 4 months ago, 1/2 pack a day [facility] 11/01/21  Vaping Use   Vaping Use: Never used  Substance and Sexual Activity   Alcohol use: Yes    Alcohol/week: 0.0 standard drinks    Comment: very rare   Drug use: No   Sexual activity: Not Currently  Other Topics Concern   Not on file  Social History Narrative   Regular exercise: walk; 1 mile a day / walk 5 miles weekend;    update 02/22/2020 not exercising    Caffeine use: 1-2 cups of coffee per day   3 children   Lives alone with her dogs, son lives next door   Social Determinants of Health   Financial Resource Strain: Not on file  Food Insecurity: Not on file  Transportation Needs: Not on file  Physical Activity: Not on file  Stress: Not on file  Social Connections: Not on file  Intimate Partner Violence: Not on file    Outpatient Medications Prior to Visit  Medication Sig Dispense Refill   ascorbic acid (VITAMIN C) 1000 MG tablet Take 500 mg by mouth daily.      aspirin 81 MG chewable tablet Chew 81 mg by mouth daily.      bimatoprost (LUMIGAN) 0.01 % SOLN Place 1 drop into both eyes at bedtime.     CALCIUM PO Take 1 tablet by mouth daily.     Cholecalciferol (VITAMIN D3 PO) Take 1 tablet by mouth daily.     Cyanocobalamin (VITAMIN B-12 PO) Take 1 tablet by mouth daily.     fluticasone (FLONASE) 50 MCG/ACT nasal spray Place 2 sprays into both nostrils at bedtime.     hydrochlorothiazide (HYDRODIURIL) 25 MG tablet Take 1 tablet (25 mg total) by mouth daily. 90 tablet 3   levothyroxine (SYNTHROID) 88 MCG tablet Take 1 tablet (88 mcg total) by mouth daily. 90 tablet 3   losartan (COZAAR) 100 MG tablet Take 1 tablet (100 mg total) by mouth daily. 90 tablet 3   meclizine (ANTIVERT) 25 MG tablet Take 1 tablet (25 mg total) by mouth 3 (three) times daily as needed for dizziness. 90 tablet 9   mirabegron ER (MYRBETRIQ) 50 MG TB24 tablet Take 1 tablet (50 mg total) by mouth daily. 30 tablet 5   pantoprazole (PROTONIX) 40 MG tablet TAKE 1 TABLET(40 MG) BY MOUTH TWICE DAILY 180 tablet 1   Probiotic Product (PROBIOTIC ADVANCED PO) Take 1 capsule by mouth daily.     Tiotropium Bromide Monohydrate (SPIRIVA RESPIMAT) 2.5 MCG/ACT AERS Inhale 2 puffs into the lungs daily. 4 g 0   promethazine-dextromethorphan (PROMETHAZINE-DM) 6.25-15 MG/5ML syrup Take 5 mLs by mouth 4 (four) times daily as needed for cough. 118 mL 0   No facility-administered  medications prior to visit.  Allergies  Allergen Reactions   Morphine Nausea And Vomiting   Morphine And Related     Nausea Can tolerate Dilaudid   Statins Other (See Comments)    Joint pain    Tape Other (See Comments)    Causes redness and will take skin when removed   Wellbutrin [Bupropion] Other (See Comments)    Suicidal intensions   Denture Adhesive Rash        Objective:    Physical Exam Vitals reviewed.  Constitutional:      General: She is not in acute distress.    Appearance: She is normal weight. She is not ill-appearing.  HENT:     Head: Normocephalic and atraumatic.  Eyes:     General: No scleral icterus. Cardiovascular:     Rate and Rhythm: Normal rate and regular rhythm.     Heart sounds: No murmur heard. Pulmonary:     Effort: Pulmonary effort is normal.     Breath sounds: Normal breath sounds. No wheezing or rhonchi.  Abdominal:     General: There is no distension.     Palpations: Abdomen is soft.  Skin:    General: Skin is warm and dry.     Coloration: Skin is not jaundiced or pale.  Neurological:     General: No focal deficit present.     Mental Status: She is alert.  Psychiatric:        Mood and Affect: Mood normal.        Behavior: Behavior normal.    BP (!) 148/93 (BP Location: Right Arm, Patient Position: Sitting, Cuff Size: Normal)    Pulse 98    Resp 20    Ht 5\' 1"  (1.549 m)    Wt 137 lb 12.8 oz (62.5 kg)    SpO2 97% Comment: RA   BMI 26.04 kg/m  Wt Readings from Last 3 Encounters:  01/21/22 137 lb 12.8 oz (62.5 kg)  01/07/22 138 lb (62.6 kg)  12/09/21 131 lb 11.2 oz (59.7 kg)    Health Maintenance Due  Topic Date Due   Zoster Vaccines- Shingrix (1 of 2) Never done   COVID-19 Vaccine (4 - Booster for Moderna series) 12/27/2020    There are no preventive care reminders to display for this patient.   Lab Results  Component Value Date   TSH 0.29 (L) 08/06/2021   Lab Results  Component Value Date   WBC 10.7 (H)  08/06/2021   HGB 15.0 08/06/2021   HCT 45.3 08/06/2021   MCV 86.2 08/06/2021   PLT 206.0 08/06/2021   Lab Results  Component Value Date   NA 139 08/06/2021   K 3.6 08/06/2021   CO2 26 08/06/2021   GLUCOSE 84 08/06/2021   BUN 24 (H) 08/06/2021   CREATININE 0.91 08/06/2021   BILITOT 0.4 08/06/2021   ALKPHOS 80 08/06/2021   AST 15 08/06/2021   ALT 12 08/06/2021   PROT 7.8 08/06/2021   ALBUMIN 4.2 08/06/2021   CALCIUM 10.1 08/06/2021   ANIONGAP 9 08/15/2019   GFR 62.19 08/06/2021   Lab Results  Component Value Date   CHOL 216 (H) 12/17/2020   Lab Results  Component Value Date   HDL 39.00 (L) 12/17/2020   Lab Results  Component Value Date   LDLCALC 138 (H) 12/17/2020   Lab Results  Component Value Date   TRIG 191.0 (H) 12/17/2020   Lab Results  Component Value Date   CHOLHDL 6 12/17/2020   Lab Results  Component Value Date  HGBA1C 5.8 12/17/2020   CT ABDOMEN PELVIS W WO CONTRAST  Result Date: 01/17/2022 CLINICAL DATA:  Microscopic hematuria. Urinary and bowel incontinence. Back pain. History of hysterectomy, cholecystectomy, appendectomy and partial colon resection. EXAM: CT ABDOMEN AND PELVIS WITHOUT AND WITH CONTRAST TECHNIQUE: Multidetector CT imaging of the abdomen and pelvis was performed following the standard protocol before and following the bolus administration of intravenous contrast. RADIATION DOSE REDUCTION: This exam was performed according to the departmental dose-optimization program which includes automated exposure control, adjustment of the mA and/or kV according to patient size and/or use of iterative reconstruction technique. CONTRAST:  190mL ISOVUE-300 IOPAMIDOL (ISOVUE-300) INJECTION 61% COMPARISON:  Noncontrast abdominopelvic CT 05/23/2020 FINDINGS: Lower chest: Clear lung bases. No significant pleural or pericardial effusion. Diffuse atherosclerosis of the aorta and coronary arteries. There is a small hiatal hernia. Hepatobiliary: The liver is  normal in density without suspicious focal abnormality. No significant biliary dilatation status post cholecystectomy. Pancreas: Unremarkable. No pancreatic ductal dilatation or surrounding inflammatory changes. Spleen: Normal in size without focal abnormality. Adrenals/Urinary Tract: Both adrenal glands appear normal. Pre-contrast images demonstrate no renal, ureteral or bladder calculi. Post-contrast, both kidneys enhance normally. There is no evidence of enhancing renal mass. There are low-density renal lesions bilaterally, measuring up to 1.5 cm in the posterior interpolar region of the right kidney, likely all cysts. Delayed images result in segmental visualization of the ureters. No focal upper tract urothelial abnormalities are identified. The bladder appears unremarkable, although is nearly empty on the pre and early postcontrast images. Stomach/Bowel: Enteric contrast was administered and has passed to the mid colon. Aside from the hiatal hernia, the stomach appears unremarkable for its degree of distention. The stomach appears unremarkable for its degree of distension. No evidence of bowel wall thickening, distention or surrounding inflammatory change. There are diverticular changes throughout the colon, greatest within the sigmoid region. Mildly prominent colonic stool. Probable sigmoid anastomosis. Vascular/Lymphatic: There are no enlarged abdominal or pelvic lymph nodes. Aortic and branch vessel atherosclerosis without evidence of large vessel occlusion or aneurysm. The portal, superior mesenteric and splenic veins are patent. Reproductive: Status post hysterectomy. No evidence of adnexal mass. Other: No evidence of abdominal wall hernia or ascites. Probable mild pelvic floor laxity. Musculoskeletal: No acute or significant osseous findings. Multilevel lumbar spondylosis with a degenerative anterolisthesis at L4-5. Moderate sacroiliac degenerative changes bilaterally. IMPRESSION: 1. No clear explanation  for hematuria identified. There are low-density renal lesions bilaterally which are likely all cysts. The bladder is suboptimally evaluated by this study, and further bladder evaluation may be warranted if there is persistent unexplained hematuria. 2. Diffuse colonic diverticulosis without evidence of acute inflammation. 3. Lumbar spondylosis and sacroiliac degenerative changes. 4.  Aortic Atherosclerosis (ICD10-I70.0). Electronically Signed   By: Richardean Sale M.D.   On: 01/17/2022 10:29   CT CHEST WO CONTRAST  Result Date: 01/21/2022 CLINICAL DATA:  follow-up of thoracic aortic aneurysm. History of breast reduction. No history of cancer. Hypertension. COPD. Ex-smoker. EXAM: CT CHEST WITHOUT CONTRAST TECHNIQUE: Multidetector CT imaging of the chest was performed following the standard protocol without IV contrast. RADIATION DOSE REDUCTION: This exam was performed according to the departmental dose-optimization program which includes automated exposure control, adjustment of the mA and/or kV according to patient size and/or use of iterative reconstruction technique. COMPARISON:  01/16/2021 FINDINGS: Cardiovascular: Advanced aortic and branch vessel atherosclerosis. Normal caliber of the great vessels. Mid ascending aortic maximal diameter of 4.1 cm on 71/2 is similar to 4.2 cm at the same level on  the prior exam. Based on coronal reformats, the aorta measures 3.6 cm at the sinuses of Valsalva, 3.0 cm at the sinotubular junction, and 3.9 cm in the mid ascending segment. Similar. Tortuous descending thoracic aorta. Mild cardiomegaly. Left main and 3 vessel coronary artery calcification. Mediastinum/Nodes: Thyroidectomy. No mediastinal or definite hilar adenopathy, given limitations of unenhanced CT. Small hiatal hernia. Lungs/Pleura: No pleural fluid. Mild centrilobular and paraseptal emphysema. Bibasilar scarring. Left upper lobe 5 mm calcified granuloma. Minimal motion degradation in the lower chest.  Well-circumscribed superior segment left lower lobe pulmonary nodule of maximally 8 mm is similar to on the prior exam (when remeasured). Upper Abdomen: Motion degradation continuing into the upper abdomen. Normal imaged portions of the spleen, pancreas, adrenal glands, kidneys. Extensive colonic diverticulosis. Musculoskeletal: No acute osseous abnormality. IMPRESSION: 1. Similar mild ascending aortic dilatation, maximally 4.1 cm. Recommend annual imaging followup by CTA or MRA. This recommendation follows 2010 ACCF/AHA/AATS/ACR/ASA/SCA/SCAI/SIR/STS/SVM Guidelines for the Diagnosis and Management of Patients with Thoracic Aortic Disease. Circulation. 2010; 121: X458-P929. Aortic aneurysm NOS (ICD10-I71.9) 2.  No acute process in the chest. 3. Aortic atherosclerosis (ICD10-I70.0), coronary artery atherosclerosis and emphysema (ICD10-J43.9). 4. Benign 8 mm left lower lobe pulmonary nodule, as before. Electronically Signed   By: Abigail Miyamoto M.D.   On: 01/21/2022 13:56       Assessment & Plan:   Problem List Items Addressed This Visit     Thoracic ascending aortic aneurysm ([place]) - Primary  A/P: Her thoracic aneurysm is stable in appearance.  Her lung nodule has also stable in appearance and also benign in appearance.  Her blood pressure is noted to be elevated today but she reports that under normal circumstances it is in the 1 20-1 30 range on her current medication regimen.  She does feel that stress has been much worse recently in her life.  We discussed the importance of close follow-up with her primary and cardiology providers.  She has also been seen by pulmonology.  Has quit smoking.  She understands that medication and lifestyle are very important to prevent enlargement of the aneurysm and should her blood pressure become less controlled she needs to contact her provider.  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  From our perspective as the  aneurysm is stable we will continue to monitor on a yearly basis with CTA of the chest.   No orders of the defined types were placed in this encounter.    John Giovanni, PA-C

## 2022-01-23 ENCOUNTER — Encounter: Payer: Self-pay | Admitting: Endocrinology

## 2022-01-23 ENCOUNTER — Ambulatory Visit (INDEPENDENT_AMBULATORY_CARE_PROVIDER_SITE_OTHER): Payer: Medicare Other | Admitting: Endocrinology

## 2022-01-23 ENCOUNTER — Other Ambulatory Visit: Payer: Self-pay

## 2022-01-23 VITALS — BP 124/78 | HR 67 | Ht 61.0 in | Wt 130.2 lb

## 2022-01-23 DIAGNOSIS — E039 Hypothyroidism, unspecified: Secondary | ICD-10-CM | POA: Diagnosis not present

## 2022-01-23 LAB — TSH: TSH: 15.59 u[IU]/mL — ABNORMAL HIGH (ref 0.35–5.50)

## 2022-01-23 LAB — T4, FREE: Free T4: 0.8 ng/dL (ref 0.60–1.60)

## 2022-01-23 MED ORDER — LEVOTHYROXINE SODIUM 112 MCG PO TABS: 112.0000 ug | ORAL_TABLET | Freq: Every day | ORAL | 3 refills | Status: DC

## 2022-01-23 NOTE — Patient Instructions (Addendum)
Blood tests are requested for you today.  We'll let you know about the results.  Please come back for a follow-up appointment in 1 year.    

## 2022-01-23 NOTE — Progress Notes (Signed)
Subjective:    Patient ID: Rachel Gardner, female    DOB: Jul 09, 1946, 76 y.o.   MRN: [id number]  HPI Pt returns for f/u of postsurgical hypothyroidism (she had thyroidectomy in 2005, for a goiter--was benign on pathology.  she has been on prescribed thyroid hormone therapy since then; synthroid dosage has varied from 125 to 200 mcg/d).  she denies palpitations and tremor.  She says she never misses the synthroid.   Past Medical History:  Diagnosis Date   Allergy    seasonal allergies   Ascending aortic aneurysm    Blood in stool    BPPV (benign paroxysmal positional vertigo)    Cataract    bilateral-sx   COPD (chronic obstructive pulmonary disease) ([place]) 08/06/2021   CPAP (continuous positive airway pressure) dependence 02/09/2019   Diverticulitis    Fatty liver 05/28/10   per CT at [facility]   Genital herpes    GERD (gastroesophageal reflux disease)    on meds   Glaucoma    eye drops for tx   Helicobacter pylori infection    Hiatal hernia 05/28/10   CT @ [facility]   Hyperlipidemia    on meds   Hypertension    on meds   Hypothyroidism    on meds   Incontinence of urine in female    Kidney stones    Lung nodule seen on imaging study    Meniere's disease    Obstructive sleep apnea    OSA on CPAP 04/13/2019   Moderate with AHI 20/hr now on CPAP at 13cm H2O   Sleep apnea    uses CPAP    Past Surgical History:  Procedure Laterality Date   ABDOMINAL HYSTERECTOMY  1995   ANAL RECTAL MANOMETRY N/A 08/06/2016   Procedure: ANO RECTAL MANOMETRY;  Surgeon: Doran Stabler, MD;  Location: [facility];  Service: Gastroenterology;  Laterality: N/A;   APPENDECTOMY  1989   BPPV     x 4 after L cochlear implant   BREAST SURGERY  1989   breast reduction   CATARACT EXTRACTION, BILATERAL     CHOLECYSTECTOMY  1989   COCHLEAR IMPLANT     Left/2009; Right/2012   COLONOSCOPY  2012   normal   LAPAROSCOPIC SIGMOID COLECTOMY  2012   with takedown of splenic  flexure; cystoscopy with bilateral ureteral stent placement   REDUCTION MAMMAPLASTY Bilateral 1989   [place]   TOTAL THYROIDECTOMY  1995   WISDOM TOOTH EXTRACTION      Social History   Socioeconomic History   Marital status: Divorced    Spouse name: Not on file   Number of children: 3   Years of education: Not on file   Highest education level: Not on file  Occupational History   Occupation: retired Marine scientist  Tobacco Use   Smoking status: Former    Packs/day: 0.50    Years: 41.00    Pack years: 20.50    Types: Cigarettes    Quit date: 07/26/2021    Years since quitting: 0.5   Smokeless tobacco: Never   Tobacco comments:    Quit 4 months ago, 1/2 pack a day [facility] 11/01/21  Vaping Use   Vaping Use: Never used  Substance and Sexual Activity   Alcohol use: Yes    Alcohol/week: 0.0 standard drinks    Comment: very rare   Drug use: No   Sexual activity: Not Currently  Other Topics Concern   Not on file  Social History  Narrative   Regular exercise: walk; 1 mile a day / walk 5 miles weekend;    update 02/22/2020 not exercising   Caffeine use: 1-2 cups of coffee per day   3 children   Lives alone with her dogs, son lives next door   Social Determinants of Health   Financial Resource Strain: Not on file  Food Insecurity: Not on file  Transportation Needs: Not on file  Physical Activity: Not on file  Stress: Not on file  Social Connections: Not on file  Intimate Partner Violence: Not on file    Current Outpatient Medications on File Prior to Visit  Medication Sig Dispense Refill   ascorbic acid (VITAMIN C) 1000 MG tablet Take 500 mg by mouth daily.      aspirin 81 MG chewable tablet Chew 81 mg by mouth daily.      bimatoprost (LUMIGAN) 0.01 % SOLN Place 1 drop into both eyes at bedtime.     CALCIUM PO Take 1 tablet by mouth daily.     Cholecalciferol (VITAMIN D3 PO) Take 1 tablet by mouth daily.     Cyanocobalamin (VITAMIN B-12 PO) Take 1 tablet by mouth daily.      fluticasone (FLONASE) 50 MCG/ACT nasal spray Place 2 sprays into both nostrils at bedtime.     hydrochlorothiazide (HYDRODIURIL) 25 MG tablet Take 1 tablet (25 mg total) by mouth daily. 90 tablet 3   losartan (COZAAR) 100 MG tablet Take 1 tablet (100 mg total) by mouth daily. 90 tablet 3   meclizine (ANTIVERT) 25 MG tablet Take 1 tablet (25 mg total) by mouth 3 (three) times daily as needed for dizziness. 90 tablet 9   mirabegron ER (MYRBETRIQ) 50 MG TB24 tablet Take 1 tablet (50 mg total) by mouth daily. 30 tablet 5   pantoprazole (PROTONIX) 40 MG tablet TAKE 1 TABLET(40 MG) BY MOUTH TWICE DAILY 180 tablet 1   Probiotic Product (PROBIOTIC ADVANCED PO) Take 1 capsule by mouth daily.     Tiotropium Bromide Monohydrate (SPIRIVA RESPIMAT) 2.5 MCG/ACT AERS Inhale 2 puffs into the lungs daily. 4 g 0   promethazine-dextromethorphan (PROMETHAZINE-DM) 6.25-15 MG/5ML syrup Take 5 mLs by mouth 4 (four) times daily as needed for cough. 118 mL 0   No current facility-administered medications on file prior to visit.    Allergies  Allergen Reactions   Morphine Nausea And Vomiting   Morphine And Related     Nausea Can tolerate Dilaudid   Statins Other (See Comments)    Joint pain    Tape Other (See Comments)    Causes redness and will take skin when removed   Wellbutrin [Bupropion] Other (See Comments)    Suicidal intensions   Denture Adhesive Rash    Family History  Problem Relation Age of Onset   Hypertension Mother    Hypertension Father    Hypertension Sister    Thyroid disease Sister    Colon cancer Maternal Aunt    Prostate cancer Paternal Uncle    Colon cancer Cousin        x 2; paternal   Prostate cancer Brother    Hyperlipidemia Brother    Colon polyps Neg Hx    Esophageal cancer Neg Hx    Stomach cancer Neg Hx    Rectal cancer Neg Hx    Breast cancer Neg Hx     BP 124/78    Pulse 67    Ht 5\' 1"  (1.549 m)    Wt 130 lb 3.2 oz (  59.1 kg)    SpO2 98%    BMI 24.60 kg/m     Review of Systems     Objective:   Physical Exam VITAL SIGNS:  See vs page GENERAL: no distress    Lab Results  Component Value Date   TSH 15.59 (H) 01/23/2022      Assessment & Plan:  Hypothyroidism: uncontrolled. Plan is to increase synthroid after we confirm pt is taking as rx'ed.

## 2022-01-24 DIAGNOSIS — I10 Essential (primary) hypertension: Secondary | ICD-10-CM | POA: Diagnosis not present

## 2022-01-24 DIAGNOSIS — D369 Benign neoplasm, unspecified site: Secondary | ICD-10-CM | POA: Diagnosis not present

## 2022-01-24 DIAGNOSIS — J439 Emphysema, unspecified: Secondary | ICD-10-CM | POA: Diagnosis not present

## 2022-01-24 DIAGNOSIS — E890 Postprocedural hypothyroidism: Secondary | ICD-10-CM | POA: Diagnosis not present

## 2022-01-29 ENCOUNTER — Other Ambulatory Visit: Payer: Self-pay

## 2022-01-29 ENCOUNTER — Encounter: Payer: Self-pay | Admitting: Obstetrics and Gynecology

## 2022-01-29 ENCOUNTER — Ambulatory Visit: Payer: Medicare Other | Admitting: Obstetrics and Gynecology

## 2022-01-29 VITALS — BP 112/79 | HR 94 | Wt 130.0 lb

## 2022-01-29 DIAGNOSIS — N3281 Overactive bladder: Secondary | ICD-10-CM

## 2022-01-29 DIAGNOSIS — R3129 Other microscopic hematuria: Secondary | ICD-10-CM | POA: Diagnosis not present

## 2022-01-29 DIAGNOSIS — R159 Full incontinence of feces: Secondary | ICD-10-CM | POA: Diagnosis not present

## 2022-01-29 NOTE — Progress Notes (Signed)
[facility] Return Visit  SUBJECTIVE  History of Present Illness: Rachel Gardner is a 76 y.o. female seen in follow-up for OAB and fecal incontinence. Plan at last visit was to start Myrbetriq and metamucil.   Still having some leakage with standing on the way to the bathroom. But she feels that her symptoms have overall improved.   Has been using metamucil twice a day. Does not need to take immodium but has it at home. Symptoms have improved with the bowel leakage overall (had one bad episode with incontinence after drinking oral contrast).   She has completed a CT Abdomen and Pelvis for hematuria which was negative (records scanned into epic).   Past Medical History: Patient  has a past medical history of Allergy, Ascending aortic aneurysm, Blood in stool, BPPV (benign paroxysmal positional vertigo), Cataract, COPD (chronic obstructive pulmonary disease) ([place]) (08/06/2021), CPAP (continuous positive airway pressure) dependence (02/09/2019), Diverticulitis, Fatty liver (05/28/10), Genital herpes, GERD (gastroesophageal reflux disease), Glaucoma, Helicobacter pylori infection, Hiatal hernia (05/28/10), Hyperlipidemia, Hypertension, Hypothyroidism, Incontinence of urine in female, Kidney stones, Lung nodule seen on imaging study, Meniere's disease, Obstructive sleep apnea, OSA on CPAP (04/13/2019), and Sleep apnea.   Past Surgical History: She  has a past surgical history that includes Cholecystectomy (1989); Appendectomy (1989); Abdominal hysterectomy (1995); Total thyroidectomy (1995); Breast surgery (1989); Cochlear implant; Laparoscopic sigmoid colectomy (2012); BPPV; Anal Rectal manometry (N/A, 08/06/2016); Reduction mammaplasty (Bilateral, 1989); Wisdom tooth extraction; Colonoscopy (2012); and Cataract extraction, bilateral.   Medications: She has a current medication list which includes the following prescription(s): ascorbic acid, aspirin, bimatoprost, calcium, cholecalciferol,  cyanocobalamin, fluticasone, hydrochlorothiazide, levothyroxine, losartan, meclizine, mirabegron er, pantoprazole, probiotic product, promethazine-dextromethorphan, and spiriva respimat.   Allergies: Patient is allergic to morphine, morphine and related, statins, tape, wellbutrin [bupropion], and denture adhesive.   Social History: Patient  reports that she quit smoking about 6 months ago. Her smoking use included cigarettes. She has a 20.50 pack-year smoking history. She has never used smokeless tobacco. She reports current alcohol use. She reports that she does not use drugs.      OBJECTIVE     Physical Exam: Vitals:   01/29/22 1021  BP: 112/79  Pulse: 94  Weight: 130 lb (59 kg)   Gen: No apparent distress, A&O x 3.  Detailed Urogynecologic Evaluation:  Deferred.    ASSESSMENT AND PLAN    [name] is a 76 y.o. with:  1. Overactive bladder   2. Microscopic hematuria   3. Incontinence of feces, unspecified fecal incontinence type    OAB - continue with Myrbetriq 50mg   2. Microscopic hematuria - CT A/P clear - Will schedule cystoscopy to complete workup  3. Fecal incontinence - continue with psyllium fiber supplement  Time spent: I spent 20 minutes dedicated to the care of this patient on the date of this encounter to include pre-visit review of records, face-to-face time with the patient  and post visit documentation.

## 2022-02-03 ENCOUNTER — Encounter: Payer: Self-pay | Admitting: Obstetrics and Gynecology

## 2022-02-05 DIAGNOSIS — H401131 Primary open-angle glaucoma, bilateral, mild stage: Secondary | ICD-10-CM | POA: Diagnosis not present

## 2022-02-20 DIAGNOSIS — G4733 Obstructive sleep apnea (adult) (pediatric): Secondary | ICD-10-CM | POA: Diagnosis not present

## 2022-02-20 DIAGNOSIS — D369 Benign neoplasm, unspecified site: Secondary | ICD-10-CM | POA: Diagnosis not present

## 2022-02-20 DIAGNOSIS — E7800 Pure hypercholesterolemia, unspecified: Secondary | ICD-10-CM | POA: Diagnosis not present

## 2022-02-20 DIAGNOSIS — Z0000 Encounter for general adult medical examination without abnormal findings: Secondary | ICD-10-CM | POA: Diagnosis not present

## 2022-02-20 DIAGNOSIS — Z1389 Encounter for screening for other disorder: Secondary | ICD-10-CM | POA: Diagnosis not present

## 2022-02-20 DIAGNOSIS — J439 Emphysema, unspecified: Secondary | ICD-10-CM | POA: Diagnosis not present

## 2022-02-20 DIAGNOSIS — I10 Essential (primary) hypertension: Secondary | ICD-10-CM | POA: Diagnosis not present

## 2022-02-20 DIAGNOSIS — E890 Postprocedural hypothyroidism: Secondary | ICD-10-CM | POA: Diagnosis not present

## 2022-02-21 ENCOUNTER — Encounter: Payer: Self-pay | Admitting: Obstetrics and Gynecology

## 2022-02-21 ENCOUNTER — Other Ambulatory Visit: Payer: Self-pay

## 2022-02-21 ENCOUNTER — Other Ambulatory Visit (HOSPITAL_COMMUNITY)
Admission: RE | Admit: 2022-02-21 | Discharge: 2022-02-21 | Disposition: A | Discharge status: Home / Self Care | Payer: Medicare Other | Source: Ambulatory Visit | Attending: Obstetrics and Gynecology | Admitting: Obstetrics and Gynecology

## 2022-02-21 ENCOUNTER — Ambulatory Visit: Payer: Medicare Other | Admitting: Obstetrics and Gynecology

## 2022-02-21 VITALS — BP 130/86 | HR 93

## 2022-02-21 DIAGNOSIS — M5416 Radiculopathy, lumbar region: Secondary | ICD-10-CM | POA: Diagnosis not present

## 2022-02-21 DIAGNOSIS — N329 Bladder disorder, unspecified: Secondary | ICD-10-CM | POA: Diagnosis not present

## 2022-02-21 DIAGNOSIS — N3289 Other specified disorders of bladder: Secondary | ICD-10-CM | POA: Insufficient documentation

## 2022-02-21 DIAGNOSIS — D303 Benign neoplasm of bladder: Secondary | ICD-10-CM

## 2022-02-21 DIAGNOSIS — R3129 Other microscopic hematuria: Secondary | ICD-10-CM | POA: Diagnosis not present

## 2022-02-21 DIAGNOSIS — M4316 Spondylolisthesis, lumbar region: Secondary | ICD-10-CM | POA: Diagnosis not present

## 2022-02-21 NOTE — Patient Instructions (Signed)
Taking Care of Yourself after Urodynamics, Cystoscopy, Coaptite Injection, or Botox Injection   Drink plenty of water for a day or two following your procedure. Try to have about 8 ounces (one cup) at a time, and do this 6 times or more per day unless you have fluid restrictitons AVOID irritative beverages such as coffee, tea, soda, alcoholic or citrus drinks for a day or two, as this may cause burning with urination.  For the first 1-2 days after the procedure, your urine may be pink or red in color. You may have some blood in your urine as a normal side effect of the procedure. Large amounts of bleeding or difficulty urinating are NOT normal. Call the nurse line if this happens or go to the nearest Emergency Room if the bleeding is heavy or you cannot urinate at all and it is after hours. You may experience some discomfort or a burning sensation with urination after having this procedure. You can use over the counter Azo or pyridium to help with burning and follow the instructions on the packaging. If it does not improve within 1-2 days, or other symptoms appear (fever, chills, or difficulty urinating) call the office to speak to a nurse.  You may return to normal daily activities such as work, school, driving, exercising and housework on the day of the procedure.  

## 2022-02-21 NOTE — Progress Notes (Signed)
CYSTOSCOPY  CC:  This is a 76 y.o. with microscopic hematuria who presents today for cystoscopy.  CT abdomen and pelvis was performed on 01/17/22:  IMPRESSION: 1. No clear explanation for hematuria identified. There are low-density renal lesions bilaterally which are likely all cysts. The bladder is suboptimally evaluated by this study, and further bladder evaluation may be warranted if there is persistent unexplained hematuria. 2. Diffuse colonic diverticulosis without evidence of acute inflammation. 3. Lumbar spondylosis and sacroiliac degenerative changes. 4.  Aortic Atherosclerosis (ICD10-I70.0).  POC urine: moderate blood, otherwise negative  BP 130/86    Pulse 93   CYSTOSCOPY: A time out was performed.  The periurethral area was prepped and draped in a sterile manner.  2% lidocaine jetpack was inserted at the urethral meatus and the urethra and bladder visualized with 0- and 70-degree scopes.  She had normal urethral coaptation and normal urethral mucosa.  Squamous metaplasia was present in the trigone with a small frondular appearing mass present near the bladder neck. Bladder trabeculations were also present throughout. She had bilateral clear efflux from both ureteral orifices.  She had cellules or diverticuli.     ASSESSMENT:  76 y.o. with microscopic hematuria.   PLAN:  - bladder mass present in trigone. Will send urine cytology. Plan for cystoscopy and biopsy in the OR.   All questions answered and post-procedure instructions were given  [name], MD

## 2022-02-24 LAB — CYTOLOGY - NON PAP

## 2022-03-03 ENCOUNTER — Encounter (HOSPITAL_BASED_OUTPATIENT_CLINIC_OR_DEPARTMENT_OTHER): Payer: Self-pay | Admitting: Obstetrics and Gynecology

## 2022-03-04 ENCOUNTER — Encounter (HOSPITAL_BASED_OUTPATIENT_CLINIC_OR_DEPARTMENT_OTHER): Payer: Self-pay | Admitting: Obstetrics and Gynecology

## 2022-03-04 ENCOUNTER — Other Ambulatory Visit: Payer: Self-pay

## 2022-03-04 NOTE — H&P (Signed)
[facility] ?Pre-Operative H&P ? ?Subjective ?Chief Complaint: Rachel Gardner presents for a preoperative encounter.  ? ?History of Present Illness: ?Rachel Gardner is a 76 y.o. female who presents for preoperative visit.  She is scheduled to undergo cystoscopy with biopsy on 03/06/22.  She had a cystoscopy in the office for microscopic hematuria which showed a small bladder lesion.  ?.  ? ? ?Past Medical History:  ?Diagnosis Date  ? Ascending aortic aneurysm   ? 4.1 cm per 01-21-2022 chest ct  ? BPPV (benign paroxysmal positional vertigo)   ? COPD (chronic obstructive pulmonary disease) ([place]) 08/06/2021  ? Diverticulitis   ? Fatty liver 05/28/2010  ? per CT at [facility]  ? Fecal incontinence   ? weras depends  ? Genital herpes   ? GERD (gastroesophageal reflux disease)   ? on meds  ? Glaucoma   ? eye drops for tx both eyes  ? Helicobacter pylori infection   ? Hiatal hernia 05/28/2010  ? CT @ [facility]  ? History of kidney stones   ? Hyperlipidemia   ? on meds  ? Hypertension   ? on meds  ? Hypothyroidism   ? on meds  ? Incontinence of urine in female   ? wears depends  ? Lesion of bladder   ? Lung nodule seen on imaging study   ? 9 mm  ? Meniere's disease   ? occ vertigo  ? OSA on CPAP 04/13/2019  ? does not uses cpap mild osa  ? Wears glasses   ? Wears partial dentures upper and lower   ?  ? ?Past Surgical History:  ?Procedure Laterality Date  ? ABDOMINAL HYSTERECTOMY  1995  ? ANAL RECTAL MANOMETRY N/A 08/06/2016  ? Procedure: ANO RECTAL MANOMETRY;  Surgeon: Doran Stabler, MD;  Location: [facility];  Service: Gastroenterology;  Laterality: N/A;  ? APPENDECTOMY  1989  ? BPPV    ? x 4 after L cochlear implant  ? BREAST SURGERY  1989  ? breast reduction  ? CATARACT EXTRACTION, BILATERAL    ? yrs ago  ? CHOLECYSTECTOMY  1989  ? COCHLEAR IMPLANT    ? Left/2009; Right/2012  ? COLONOSCOPY  2012  ? normal 02-2021 also and 02-03-2014  ? LAPAROSCOPIC SIGMOID COLECTOMY  2012  ? with  takedown of splenic flexure; cystoscopy with bilateral ureteral stent placement  ? REDUCTION MAMMAPLASTY Bilateral 1989  ? [place]  ? TOTAL THYROIDECTOMY  1995  ? WISDOM TOOTH EXTRACTION    ? yrs ago  ? ? ?is allergic to morphine, morphine and related, statins, tape, wellbutrin [bupropion], and denture adhesive.  ? ?Family History  ?Problem Relation Age of Onset  ? Hypertension Mother   ? Hypertension Father   ? Hypertension Sister   ? Thyroid disease Sister   ? Colon cancer Maternal Aunt   ? Prostate cancer Paternal Uncle   ? Colon cancer Cousin   ?     x 2; paternal  ? Prostate cancer Brother   ? Hyperlipidemia Brother   ? Colon polyps Neg Hx   ? Esophageal cancer Neg Hx   ? Stomach cancer Neg Hx   ? Rectal cancer Neg Hx   ? Breast cancer Neg Hx   ? ? ?Social History  ? ?Tobacco Use  ? Smoking status: Former  ?  Packs/day: 0.50  ?  Years: 41.00  ?  Pack years: 20.50  ?  Types: Cigarettes  ?  Quit date:  07/26/2021  ?  Years since quitting: 0.6  ? Smokeless tobacco: Never  ? Tobacco comments:  ?  Quit 4 months ago, 1/2 pack a day [facility] 11/01/21  ?Vaping Use  ? Vaping Use: Never used  ?Substance Use Topics  ? Alcohol use: Yes  ?  Alcohol/week: 0.0 standard drinks  ?  Comment: very rare  ? Drug use: No  ? ? ? ?Review of Systems was negative for a full 10 system review except as noted in the History of Present Illness. ? ?No current facility-administered medications for this encounter. ? ?Current Outpatient Medications:  ?  aspirin 81 MG chewable tablet, Chew 81 mg by mouth daily. , Disp: , Rfl:  ?  ibuprofen (ADVIL) 600 MG tablet, Take 600 mg by mouth every 6 (six) hours as needed., Disp: , Rfl:  ?  VITAMIN E PO, Take by mouth., Disp: , Rfl:  ?  ascorbic acid (VITAMIN C) 1000 MG tablet, Take 500 mg by mouth daily. , Disp: , Rfl:  ?  bimatoprost (LUMIGAN) 0.01 % SOLN, Place 1 drop into both eyes at bedtime., Disp: , Rfl:  ?  CALCIUM PO, Take 1 tablet by mouth daily., Disp: , Rfl:  ?  Cholecalciferol (VITAMIN D3 PO), Take 1  tablet by mouth daily., Disp: , Rfl:  ?  Cyanocobalamin (VITAMIN B-12 PO), Take 1 tablet by mouth daily., Disp: , Rfl:  ?  fluticasone (FLONASE) 50 MCG/ACT nasal spray, Place 2 sprays into both nostrils at bedtime., Disp: , Rfl:  ?  hydrochlorothiazide (HYDRODIURIL) 25 MG tablet, Take 1 tablet (25 mg total) by mouth daily., Disp: 90 tablet, Rfl: 3 ?  levothyroxine (SYNTHROID) 112 MCG tablet, Take 1 tablet (112 mcg total) by mouth daily., Disp: 90 tablet, Rfl: 3 ?  losartan (COZAAR) 100 MG tablet, Take 1 tablet (100 mg total) by mouth daily., Disp: 90 tablet, Rfl: 3 ?  meclizine (ANTIVERT) 25 MG tablet, Take 1 tablet (25 mg total) by mouth 3 (three) times daily as needed for dizziness. (Patient not taking: Reported on 03/04/2022), Disp: 90 tablet, Rfl: 9 ?  mirabegron ER (MYRBETRIQ) 50 MG TB24 tablet, Take 1 tablet (50 mg total) by mouth daily., Disp: 30 tablet, Rfl: 5 ?  Probiotic Product (PROBIOTIC ADVANCED PO), Take 1 capsule by mouth daily., Disp: , Rfl:  ?  Tiotropium Bromide Monohydrate (SPIRIVA RESPIMAT) 2.5 MCG/ACT AERS, Inhale 2 puffs into the lungs daily., Disp: 4 g, Rfl: 0  ? ?Objective ?There were no vitals filed for this visit. ? ?Gen: NAD ?CV: S1 S2 RRR ?Lungs: Clear to auscultation bilaterally ?Abd: soft, nontender ? ? ?Assessment/ Plan ?The patient is a 76 y.o. year old with bladder mass on office cystoscopy to undergo Cystoscopy with biopsy.  ? ? ? ?[name], MD ? ? ? ? ?

## 2022-03-04 NOTE — Progress Notes (Signed)
Spoke w/ via phone for pre-op interview---pt and son fabio due to pt hearing impaired on phone, patient  hears well in person ?Lab needs dos----   istat             ?Lab results------see below ?COVID test -----patient states asymptomatic no test needed ?Arrive at -------700 am 03-06-2022 ?NPO after MN NO Solid Food.  Clear liquids from MN until---600 am   ?Medications to take morning of surgery -----Levothyroxine, Spiriva, Mirabegron ?Diabetic medication -----n/a ?Patient instructed no nail polish to be worn day of surgery ?Patient instructed to bring photo id and insurance card day of surgery ?Patient aware to have Driver (ride ) / caregiver  son fabio cell [phone]   for 24 hours after surgery  ?Patient Special Instructions -----none ?Pre-Op special Istructions -----none ?Patient verbalized understanding of instructions that were given at this phone interview. ?Patient denies any cardiac s & s, , shortness of breath, chest pain, fever, cough at this phone interview.  ? ?Anesthesia :htn, thoracic ascending aortic aneurysm (4.1 cm), per 01-21-2022 chest ct epic,  LLL nodule 8, copd, osa no cpap used, copd no oxygen used ? ?PCP:dr s raju ?Cardiologist :dr m skains 11-13-2021 epic ?Chest x-ray :08-06-2021 epic ?EKG : 11-13-2021 chart/epic ?Echo :12-03-2021 epic ef greater than 75% ?Stress test:11-02-2019 ef 67% epic ?Lov pulmonary dr Shaune Pollack 01-07-2022 epic ?Lov [facility] cardiothoracic 01-21-2022 epic ?Chest ct 01-21-2022 epic ?Osa lov dr t turner 10-23-2021 epic ?Pf test 01-07-2022 epic ?Sleep study 10-28-2018 epic ?Cardiac Cath : none ?Activity level: walks independent in home no oxygen use, can climb flight of stairs without sob ?Sleep Study/ CPAP :mild osa no cpap ?:ASA / Instructions/ Last Dose :  81 mg asa last dose 03-03-2022 per dr schroeder instructions ?

## 2022-03-06 ENCOUNTER — Ambulatory Visit (HOSPITAL_BASED_OUTPATIENT_CLINIC_OR_DEPARTMENT_OTHER): Payer: Medicare Other | Admitting: Anesthesiology

## 2022-03-06 ENCOUNTER — Encounter (HOSPITAL_BASED_OUTPATIENT_CLINIC_OR_DEPARTMENT_OTHER): Payer: Self-pay | Admitting: Obstetrics and Gynecology

## 2022-03-06 ENCOUNTER — Telehealth: Payer: Self-pay | Admitting: Obstetrics and Gynecology

## 2022-03-06 ENCOUNTER — Ambulatory Visit (HOSPITAL_BASED_OUTPATIENT_CLINIC_OR_DEPARTMENT_OTHER)
Admission: RE | Admit: 2022-03-06 | Discharge: 2022-03-06 | Disposition: A | Discharge status: Home / Self Care | Payer: Medicare Other | Attending: Obstetrics and Gynecology | Admitting: Obstetrics and Gynecology

## 2022-03-06 ENCOUNTER — Encounter (HOSPITAL_BASED_OUTPATIENT_CLINIC_OR_DEPARTMENT_OTHER)
Admission: RE | Disposition: A | Discharge status: Home / Self Care | Payer: Self-pay | Source: Home / Self Care | Attending: Obstetrics and Gynecology

## 2022-03-06 DIAGNOSIS — K449 Diaphragmatic hernia without obstruction or gangrene: Secondary | ICD-10-CM | POA: Insufficient documentation

## 2022-03-06 DIAGNOSIS — I2510 Atherosclerotic heart disease of native coronary artery without angina pectoris: Secondary | ICD-10-CM | POA: Diagnosis not present

## 2022-03-06 DIAGNOSIS — Z87891 Personal history of nicotine dependence: Secondary | ICD-10-CM | POA: Diagnosis not present

## 2022-03-06 DIAGNOSIS — K219 Gastro-esophageal reflux disease without esophagitis: Secondary | ICD-10-CM | POA: Diagnosis not present

## 2022-03-06 DIAGNOSIS — J449 Chronic obstructive pulmonary disease, unspecified: Secondary | ICD-10-CM | POA: Diagnosis not present

## 2022-03-06 DIAGNOSIS — R3129 Other microscopic hematuria: Secondary | ICD-10-CM

## 2022-03-06 DIAGNOSIS — I10 Essential (primary) hypertension: Secondary | ICD-10-CM | POA: Insufficient documentation

## 2022-03-06 DIAGNOSIS — G4733 Obstructive sleep apnea (adult) (pediatric): Secondary | ICD-10-CM | POA: Diagnosis not present

## 2022-03-06 DIAGNOSIS — N329 Bladder disorder, unspecified: Secondary | ICD-10-CM

## 2022-03-06 DIAGNOSIS — N3289 Other specified disorders of bladder: Secondary | ICD-10-CM | POA: Diagnosis not present

## 2022-03-06 DIAGNOSIS — Z79899 Other long term (current) drug therapy: Secondary | ICD-10-CM | POA: Insufficient documentation

## 2022-03-06 DIAGNOSIS — R3121 Asymptomatic microscopic hematuria: Secondary | ICD-10-CM

## 2022-03-06 DIAGNOSIS — E039 Hypothyroidism, unspecified: Secondary | ICD-10-CM | POA: Insufficient documentation

## 2022-03-06 HISTORY — DX: Personal history of urinary calculi: Z87.442

## 2022-03-06 HISTORY — DX: Full incontinence of feces: R15.9

## 2022-03-06 HISTORY — DX: Presence of dental prosthetic device (complete) (partial): Z97.2

## 2022-03-06 HISTORY — DX: Bladder disorder, unspecified: N32.9

## 2022-03-06 HISTORY — PX: CYSTOSCOPY: SHX5120

## 2022-03-06 HISTORY — DX: Presence of spectacles and contact lenses: Z97.3

## 2022-03-06 HISTORY — DX: Unspecified urinary incontinence: R32

## 2022-03-06 LAB — POCT I-STAT, CHEM 8
BUN: 27 mg/dL — ABNORMAL HIGH (ref 8–23)
Calcium, Ion: 1.33 mmol/L (ref 1.15–1.40)
Chloride: 103 mmol/L (ref 98–111)
Creatinine, Ser: 1.1 mg/dL — ABNORMAL HIGH (ref 0.44–1.00)
Glucose, Bld: 95 mg/dL (ref 70–99)
HCT: 42 % (ref 36.0–46.0)
Hemoglobin: 14.3 g/dL (ref 12.0–15.0)
Potassium: 3.8 mmol/L (ref 3.5–5.1)
Sodium: 140 mmol/L (ref 135–145)
TCO2: 27 mmol/L (ref 22–32)

## 2022-03-06 SURGERY — CYSTOSCOPY
Anesthesia: Monitor Anesthesia Care | Site: Bladder

## 2022-03-06 MED ORDER — LIDOCAINE HCL (PF) 2 % IJ SOLN
INTRAMUSCULAR | Status: AC
  Filled 2022-03-06: qty 5

## 2022-03-06 MED ORDER — PROPOFOL 10 MG/ML IV BOLUS
INTRAVENOUS | Status: AC
  Filled 2022-03-06: qty 20

## 2022-03-06 MED ORDER — ACETAMINOPHEN 10 MG/ML IV SOLN: 1000.0000 mg | Freq: Once | INTRAVENOUS | Status: DC | PRN

## 2022-03-06 MED ORDER — LIDOCAINE HCL (CARDIAC) PF 100 MG/5ML IV SOSY
PREFILLED_SYRINGE | INTRAVENOUS | Status: DC | PRN
  Administered 2022-03-06: 50 mg via INTRAVENOUS

## 2022-03-06 MED ORDER — ONDANSETRON HCL 4 MG/2ML IJ SOLN
INTRAMUSCULAR | Status: AC
  Filled 2022-03-06: qty 2

## 2022-03-06 MED ORDER — PROPOFOL 500 MG/50ML IV EMUL
INTRAVENOUS | Status: DC | PRN
  Administered 2022-03-06: 75 ug/kg/min via INTRAVENOUS

## 2022-03-06 MED ORDER — CEFAZOLIN SODIUM-DEXTROSE 2-4 GM/100ML-% IV SOLN
2.0000 g | INTRAVENOUS | Status: AC
  Administered 2022-03-06: 09:00:00 2 g via INTRAVENOUS

## 2022-03-06 MED ORDER — MIDAZOLAM HCL 2 MG/2ML IJ SOLN
INTRAMUSCULAR | Status: AC
  Filled 2022-03-06: qty 2

## 2022-03-06 MED ORDER — PHENYLEPHRINE HCL (PRESSORS) 10 MG/ML IV SOLN
INTRAVENOUS | Status: DC | PRN
  Administered 2022-03-06 (×3): 80 ug via INTRAVENOUS

## 2022-03-06 MED ORDER — KETOROLAC TROMETHAMINE 30 MG/ML IJ SOLN
INTRAMUSCULAR | Status: AC
  Filled 2022-03-06: qty 1

## 2022-03-06 MED ORDER — DEXAMETHASONE SODIUM PHOSPHATE 4 MG/ML IJ SOLN
INTRAMUSCULAR | Status: DC | PRN
Start: 2022-03-06 — End: 2022-03-06
  Administered 2022-03-06: 5 mg via INTRAVENOUS

## 2022-03-06 MED ORDER — DEXAMETHASONE SODIUM PHOSPHATE 10 MG/ML IJ SOLN
INTRAMUSCULAR | Status: AC
  Filled 2022-03-06: qty 1

## 2022-03-06 MED ORDER — STERILE WATER FOR IRRIGATION IR SOLN
Status: DC | PRN
  Administered 2022-03-06: 3000 mL via INTRAVESICAL

## 2022-03-06 MED ORDER — FENTANYL CITRATE (PF) 100 MCG/2ML IJ SOLN: 25.0000 ug | INTRAMUSCULAR | Status: DC | PRN

## 2022-03-06 MED ORDER — ONDANSETRON HCL 4 MG/2ML IJ SOLN
INTRAMUSCULAR | Status: DC | PRN
  Administered 2022-03-06: 4 mg via INTRAVENOUS

## 2022-03-06 MED ORDER — LACTATED RINGERS IV SOLN: INTRAVENOUS | Status: DC

## 2022-03-06 MED ORDER — PROPOFOL 10 MG/ML IV BOLUS
INTRAVENOUS | Status: DC | PRN
  Administered 2022-03-06 (×3): 10 mg via INTRAVENOUS

## 2022-03-06 MED ORDER — PROPOFOL 500 MG/50ML IV EMUL
INTRAVENOUS | Status: AC
  Filled 2022-03-06: qty 50

## 2022-03-06 MED ORDER — CEFAZOLIN SODIUM-DEXTROSE 2-4 GM/100ML-% IV SOLN
INTRAVENOUS | Status: AC
  Filled 2022-03-06: qty 100

## 2022-03-06 SURGICAL SUPPLY — 9 items
ELECT REM PT RETURN 9FT ADLT (ELECTROSURGICAL) ×2
ELECTRODE REM PT RTRN 9FT ADLT (ELECTROSURGICAL) IMPLANT
GLOVE SURG ENC MOIS LTX SZ6 (GLOVE) ×2 IMPLANT
GLOVE SURG UNDER POLY LF SZ6.5 (GLOVE) ×2 IMPLANT
GOWN STRL REUS W/TWL LRG LVL3 (GOWN DISPOSABLE) ×2 IMPLANT
KIT TURNOVER CYSTO (KITS) ×2 IMPLANT
MANIFOLD NEPTUNE II (INSTRUMENTS) ×2 IMPLANT
PACK CYSTO (CUSTOM PROCEDURE TRAY) ×2 IMPLANT
WATER STERILE IRR 3000ML UROMA (IV SOLUTION) ×1 IMPLANT

## 2022-03-06 NOTE — Op Note (Signed)
Operative Note ? ?Preoperative Diagnosis:  microscopic hematuria, bladder lesion ? ?Postoperative Diagnosis: same ? ?Procedures performed:  ?Cystoscopy with biopsy ? ? ?Attending Surgeon: Sherlene Shams, MD ? ?Anesthesia: MAC ? ?Findings: 0.5cm frondular lesion in midline trigone, approximately 1 cm from the bladder neck.   ?  ? ?Specimens:  ?ID Type Source Tests Collected by Time Destination  ?1 : bladder lesion Tissue PATH GU biopsy SURGICAL PATHOLOGY [name], MD 03/06/2022 [phone]   ? ? ?Estimated blood loss: minimal ? ?IV fluids: 750 mL ? ?Urine output: not recorded ? ?Complications: none ? ?Indications: This is a 76yo F with a history of microscopic hematuria. Cystoscopy in the office revealed a small frondular lesion in the trigone. Urine cytology was performed which was negative for high grade urothelial carcinoma. Therefore, decision was made to biopsy the lesion in the operating room.  ? ?Procedure in Detail: ? ?After informed consent was obtained the patient, the patient was taken to the operating room where MAC anesthesia was placed and adequate. She was placed in dorsal lithotomy position taking care to avoid any nerve injury, and prepped and draped in the usual sterile fashion. A 30 degree 30F cystoscope was inserted with sterile water. A 360 degree view of the bladder was obtained. Mucosa was normal except for a 0.5cm frondular lesion in the midline trigone, close to the bladder neck. This was grasped with a rigid cup forcep and sent to pathology. A bugbee was inserted and the base of the lesion was cauterized. Good hemostasis was noted. The bladder was drained and all instruments were removed.  ? ?The patient tolerated the procedure well and was taken to recovery in stable condition. All counts were correct x2. ? ?[name], MD ? ? ?

## 2022-03-06 NOTE — Telephone Encounter (Signed)
Rachel Gardner underwent cystoscopy with biopsy on 03/06/22.  ? ?She did not require a voiding trial. She was discharged without a catheter. Please call her for a routine post op check. Thanks! ? ?[name], MD ? ?

## 2022-03-06 NOTE — Interval H&P Note (Signed)
History and Physical Interval Note: ? ?03/06/2022 ?8:38 AM ? ?Rachel Gardner  has presented today for surgery, with the diagnosis of bladder lesion.  The various methods of treatment have been discussed with the patient and family. After consideration of risks, benefits and other options for treatment, the patient has consented to  Procedure(s) with comments: ?CYSTOSCOPY With biopsy (N/A) as a surgical intervention.  The patient's history has been reviewed, patient examined, no change in status, stable for surgery.  I have reviewed the patient's chart and labs.  Questions were answered to the patient's satisfaction.   ? ? ?[name] ? ? ?

## 2022-03-06 NOTE — Anesthesia Preprocedure Evaluation (Signed)
Anesthesia Evaluation  ?Patient identified by MRN, date of birth, ID band ?Patient awake ? ? ? ?Reviewed: ?Allergy & Precautions, NPO status , Patient's Chart, lab work & pertinent test results ? ?Airway ?Mallampati: II ? ? ? ? ? ? Dental ? ?(+) Partial Lower, Partial Upper ?  ?Pulmonary ?sleep apnea , COPD, former smoker,  ?  ?Pulmonary exam normal ? ? ? ? ? ? ? Cardiovascular ?hypertension, Pt. on medications ?+ CAD  ? ?Rhythm:Regular Rate:Normal ? ? ?  ?Neuro/Psych ?negative neurological ROS ? negative psych ROS  ? GI/Hepatic ?Neg liver ROS, hiatal hernia, GERD  ,  ?Endo/Other  ?Hypothyroidism  ? Renal/GU ?negative Renal ROS Bladder dysfunction ? ? ? ?  ?Musculoskeletal ? ? Abdominal ?Normal abdominal exam  (+)   ?Peds ? Hematology ?  ?Anesthesia Other Findings ? ? Reproductive/Obstetrics ? ?  ? ? ? ? ? ? ? ? ? ? ? ? ? ?  ?  ? ? ? ? ? ? ? ? ?Anesthesia Physical ?Anesthesia Plan ? ?ASA: 3 ? ?Anesthesia Plan: MAC  ? ?Post-op Pain Management:   ? ?Induction:  ? ?PONV Risk Score and Plan: 2 and Ondansetron, Dexamethasone, Treatment may vary due to age or medical condition and Propofol infusion ? ?Airway Management Planned: Simple Face Mask, Natural Airway and Nasal Cannula ? ?Additional Equipment: None ? ?Intra-op Plan:  ? ?Post-operative Plan:  ? ?Informed Consent: I have reviewed the patients History and Physical, chart, labs and discussed the procedure including the risks, benefits and alternatives for the proposed anesthesia with the patient or authorized representative who has indicated his/her understanding and acceptance.  ? ? ? ?Dental advisory given ? ?Plan Discussed with:  ? ?Anesthesia Plan Comments: (ECHO 12/22: ??1. Left ventricular ejection fraction, by estimation, is >75%. The left  ?ventricle has hyperdynamic function. The left ventricle has no regional  ?wall motion abnormalities. Left ventricular diastolic parameters are  ?consistent with Grade I diastolic   ?dysfunction (impaired relaxation). The average left ventricular global  ?longitudinal strain is 21.9 %. The global longitudinal strain is normal.  ??2. Right ventricular systolic function is normal. The right ventricular  ?size is normal.  ??3. The mitral valve is normal in structure. No evidence of mitral valve  ?regurgitation. No evidence of mitral stenosis.  ??4. The aortic valve is normal in structure. Aortic valve regurgitation is  ?not visualized. No aortic stenosis is present.  ??5. Aortic dilatation noted. There is borderline dilatation of the aortic  ?root, measuring 38 mm. There is mild dilatation of the ascending aorta,  ?measuring 40 mm.  ??6. The inferior vena cava is normal in size with greater than 50%  ?respiratory variability, suggesting right atrial pressure of 3 mmHg. )  ? ? ? ? ? ? ?Anesthesia Quick Evaluation ? ?

## 2022-03-06 NOTE — Anesthesia Postprocedure Evaluation (Signed)
Anesthesia Post Note ? ?Patient: Rachel Gardner ? ?Procedure(s) Performed: CYSTOSCOPY With biopsy (Bladder) ? ?  ? ?Patient location during evaluation: PACU ?Anesthesia Type: MAC ?Level of consciousness: awake and alert ?Pain management: pain level controlled ?Vital Signs Assessment: post-procedure vital signs reviewed and stable ?Respiratory status: spontaneous breathing, nonlabored ventilation, respiratory function stable and patient connected to nasal cannula oxygen ?Cardiovascular status: stable and blood pressure returned to baseline ?Postop Assessment: no apparent nausea or vomiting ?Anesthetic complications: no ? ? ?No notable events documented. ? ?Last Vitals:  ?Vitals:  ? 03/06/22 1015 03/06/22 1030  ?BP: 116/70 (!) 144/88  ?Pulse: 66 73  ?Resp: 13 14  ?Temp:  (!) 36.3 ?C  ?SpO2: 94% 97%  ?  ?Last Pain:  ?Vitals:  ? 03/06/22 1030  ?TempSrc:   ?PainSc: 0-No pain  ? ? ?  ?  ?  ?  ?  ?  ? ?March Rummage Brenden Rudman ? ? ? ? ?

## 2022-03-06 NOTE — Transfer of Care (Signed)
Immediate Anesthesia Transfer of Care Note ? ?Patient: Rachel Gardner ? ?Procedure(s) Performed: CYSTOSCOPY With biopsy (Bladder) ? ?Patient Location: PACU ? ?Anesthesia Type:MAC ? ?Level of Consciousness: awake, alert  and oriented ? ?Airway & Oxygen Therapy: Patient Spontanous Breathing and Patient connected to nasal cannula oxygen ? ?Post-op Assessment: Report given to RN and Post -op Vital signs reviewed and stable ? ?Post vital signs: Reviewed and stable ? ?Last Vitals:  ?Vitals Value Taken Time  ?BP 82/49 03/06/22 0932  ?Temp    ?Pulse 71 03/06/22 0935  ?Resp 13 03/06/22 0935  ?SpO2 100 % 03/06/22 0935  ?Vitals shown include unvalidated device data. ? ?Last Pain:  ?Vitals:  ? 03/06/22 0721  ?TempSrc: Oral  ?PainSc: 0-No pain  ?   ? ?Patients Stated Pain Goal: 4 (03/06/22 4784) ? ?Complications: No notable events documented. ?

## 2022-03-06 NOTE — Discharge Instructions (Addendum)
Taking Care of Yourself Cystoscopy ? ?Drink plenty of water for a day or two following your procedure. Try to have about 8 ounces (one cup) at a time, and do this 6 times or more per day unless you have fluid restrictitons ?AVOID irritative beverages such as coffee, tea, soda, alcoholic or citrus drinks for a day or two, as this may cause burning with urination. ?You can take ibuprofen or tylenol over the counter for pain as needed ? For the first 1-2 days after the procedure, your urine may be pink or red in color. You may have some blood in your urine as a normal side effect of the procedure. Large amounts of bleeding or difficulty urinating are NOT normal. Call the nurse line if this happens or go to the nearest Emergency Room if the bleeding is heavy or you cannot urinate at all and it is after hours.  ?You may experience some discomfort or a burning sensation with urination after having this procedure. You can use over the counter Azo or pyridium to help with burning and follow the instructions on the packaging. If it does not improve within 1-2 days, or other symptoms appear (fever, chills, or difficulty urinating) call the office to speak to a nurse.  ?You may return to normal daily activities such as work, school, driving, exercising and housework on the day after the procedure. ? ? ?Post Anesthesia Home Care Instructions ? ?Activity: ?Get plenty of rest for the remainder of the day. A responsible individual must stay with you for 24 hours following the procedure.  ?For the next 24 hours, DO NOT: ?-Drive a car ?-Paediatric nurse ?-Drink alcoholic beverages ?-Take any medication unless instructed by your physician ?-Make any legal decisions or sign important papers. ? ?Meals: ?Start with liquid foods such as gelatin or soup. Progress to regular foods as tolerated. Avoid greasy, spicy, heavy foods. If nausea and/or vomiting occur, drink only clear liquids until the nausea and/or vomiting subsides. Call your  physician if vomiting continues. ? ?Special Instructions/Symptoms: ?Your throat may feel dry or sore from the anesthesia or the breathing tube placed in your throat during surgery. If this causes discomfort, gargle with warm salt water. The discomfort should disappear within 24 hours. ? ?   ? ?

## 2022-03-07 ENCOUNTER — Encounter: Payer: Self-pay | Admitting: Cardiology

## 2022-03-07 ENCOUNTER — Encounter: Payer: Self-pay | Admitting: Obstetrics and Gynecology

## 2022-03-07 ENCOUNTER — Other Ambulatory Visit: Payer: Self-pay | Admitting: Cardiology

## 2022-03-07 ENCOUNTER — Encounter (HOSPITAL_BASED_OUTPATIENT_CLINIC_OR_DEPARTMENT_OTHER): Payer: Self-pay | Admitting: Obstetrics and Gynecology

## 2022-03-07 NOTE — Telephone Encounter (Signed)
Post- Op Call ? ?Rachel Gardner underwent cystoscopy with biopsy on 03/06/22 with Dr Wannetta Sender. The patient reports that her pain is controlled. She is not taking any medication for pain. She reports slight burning with urination and some blood tinged urine. She was discharged without a catheter. Advised to call if burning worsens or any new symptoms arise.  ? ?Blenda Nicely, RN  ?

## 2022-03-10 ENCOUNTER — Other Ambulatory Visit: Payer: Self-pay | Admitting: Cardiology

## 2022-03-10 LAB — SURGICAL PATHOLOGY

## 2022-03-12 ENCOUNTER — Encounter (HOSPITAL_BASED_OUTPATIENT_CLINIC_OR_DEPARTMENT_OTHER): Payer: Self-pay | Admitting: *Deleted

## 2022-03-13 ENCOUNTER — Encounter: Payer: Self-pay | Admitting: Gastroenterology

## 2022-03-19 NOTE — Telephone Encounter (Signed)
Spoke with patient's son-notified him of the new appointment time for tomorrow at 11 am and that the [place] message was incorrect. He will let his mother know. I resent Mychart message with correct information.  ?

## 2022-03-20 ENCOUNTER — Other Ambulatory Visit: Payer: Self-pay

## 2022-03-20 ENCOUNTER — Ambulatory Visit (AMBULATORY_SURGERY_CENTER): Payer: Self-pay | Admitting: *Deleted

## 2022-03-20 VITALS — Ht 61.0 in | Wt 134.0 lb

## 2022-03-20 DIAGNOSIS — Z86010 Personal history of colonic polyps: Secondary | ICD-10-CM

## 2022-03-20 MED ORDER — PEG 3350-KCL-NA BICARB-NACL 420 G PO SOLR: 4000.0000 mL | Freq: Once | ORAL | 0 refills | Status: AC

## 2022-03-20 NOTE — Progress Notes (Signed)
Patient is here in-person for PV. HOH. Patient denies any allergies to eggs or soy. Patient denies any problems with anesthesia/sedation. Patient is not on any oxygen at home. Patient is not taking any diet/weight loss medications or blood thinners. Patient is aware of our care-partner policy and AQVOH-20 safety protocol. Patient is going to follow up with surgeon tomorrow-she is aware to get clearance for colonoscopy.  ? ?EMMI education assigned to the patient for the procedure, sent to [place].  ? ?Patient is COVID-19 vaccinated.  ?

## 2022-03-21 ENCOUNTER — Ambulatory Visit (INDEPENDENT_AMBULATORY_CARE_PROVIDER_SITE_OTHER): Payer: Medicare Other | Admitting: Obstetrics and Gynecology

## 2022-03-21 ENCOUNTER — Encounter: Payer: Self-pay | Admitting: Obstetrics and Gynecology

## 2022-03-21 VITALS — BP 110/81 | HR 86

## 2022-03-21 DIAGNOSIS — N3281 Overactive bladder: Secondary | ICD-10-CM

## 2022-03-21 DIAGNOSIS — R159 Full incontinence of feces: Secondary | ICD-10-CM

## 2022-03-21 DIAGNOSIS — R319 Hematuria, unspecified: Secondary | ICD-10-CM

## 2022-03-21 MED ORDER — VIBEGRON 75 MG PO TABS: 1.0000 | ORAL_TABLET | Freq: Every day | ORAL | 5 refills | Status: DC

## 2022-03-21 NOTE — Progress Notes (Unsigned)
Submitted PA for Gemtesa '75mg'$  on Cover my Meds. ?Key: [id number]  ?  ?Received instant approval ?Prior Auth: Coverage Start Date: 03/21/2022; Coverage End Date:03/22/2023  ? ?Pt's son [name] was notified of the approval. ?

## 2022-03-21 NOTE — Progress Notes (Signed)
[facility] ? ?Date of Visit: 03/21/2022 ? ?History of Present Illness: Ms. Fountaine is a 76 y.o. female scheduled today for a post-operative visit.  ?? Surgery: s/p cystoscopy with biopsy on 03/06/22 ? ?Today she reports bladder has not been well controlled. She has large leakage of her pads, especially in the morning. She is also urinating frequently. Has been on the Myrbetriq for 3 months and is not seeing improvement.  ? ?She got norovirus and had 3 days of diarrhea. But otherwise she will still have bowel leakage a few times a week. Sometimes has no control. Has done physical therapy and continues to do the exercises. ? ?Has colonoscopy scheduled for next month.  ? ?Pathology results: BLADDER LESION, BIOPSY:  ?- Scant residual mucosa with mild atypia and squamous metaplasia  ? ?She has been taking Myrbetriq for overactive bladder and psyllium for bowel leakage.  ? ?Medications: She has a current medication list which includes the following prescription(s): ascorbic acid, aspirin, bimatoprost, calcium, cholecalciferol, cyanocobalamin, fluticasone, hydrochlorothiazide, ibuprofen, levothyroxine, losartan, meclizine, mirabegron er, probiotic product, spiriva respimat, and vibegron.  ? ?Allergies: Patient is allergic to morphine, morphine and related, statins, tape, wellbutrin [bupropion], and denture adhesive.  ? ?Physical Exam: ?BP 110/81   Pulse 86   ? ?AAO x3 ? ? ?--------------------------------------------------------- ? ?Assessment and Plan:  ?1. Overactive bladder   ?2. Incontinence of feces, unspecified fecal incontinence type   ?3. Hematuria, unspecified type   ? ? ?- Reviewed pathology results that they were benign. For microscopic hematuria, will need repeat UA in 1 year.  ?- For OAB and FI, she is interested in the [employer] Interstim sacral neuromodulation. Will have her fill out a 3 day bladder and bowel diary.  ?- She wants to wait about a month after having anesthesia for her colonscopy  before pursuing another procedure. Therefore would like to try another medication in the meantime. She is unable to take anticholinergics due to glaucoma. Prescribed Gemtesa '75mg'$  daily and samples provided.  ? ?Will send request for scheduling Interstim ? ?[name], MD ? ? ? ?

## 2022-04-01 ENCOUNTER — Encounter: Payer: Self-pay | Admitting: Gastroenterology

## 2022-04-07 NOTE — Progress Notes (Signed)
error 

## 2022-04-09 ENCOUNTER — Encounter: Payer: Self-pay | Admitting: Obstetrics and Gynecology

## 2022-04-10 ENCOUNTER — Ambulatory Visit (AMBULATORY_SURGERY_CENTER): Payer: Medicare Other | Admitting: Gastroenterology

## 2022-04-10 ENCOUNTER — Encounter: Payer: Self-pay | Admitting: Gastroenterology

## 2022-04-10 VITALS — BP 122/62 | HR 80 | Temp 98.2°F | Resp 17 | Ht 61.0 in | Wt 134.0 lb

## 2022-04-10 DIAGNOSIS — Z86010 Personal history of colonic polyps: Secondary | ICD-10-CM | POA: Diagnosis not present

## 2022-04-10 HISTORY — PX: COLONOSCOPY WITH PROPOFOL: SHX5780

## 2022-04-10 MED ORDER — SODIUM CHLORIDE 0.9 % IV SOLN: 500.0000 mL | Freq: Once | INTRAVENOUS | Status: DC

## 2022-04-10 NOTE — Op Note (Signed)
[facility] ?Patient Name: Rachel Gardner ?Procedure Date: 04/10/2022 3:06 PM ?MRN: [id number] ?Endoscopist: Estill Cotta. Loletha Carrow , MD ?Age: 76 ?Referring MD:  ?Date of Birth: Aug 31, 1946 ?Gender: Female ?Account #: [id number] ?Procedure:                Colonoscopy ?Indications:              Surveillance: History of adenomatous polyps,  ?                          inadequate prep on last exam (<78yr ?                          TA < 152mx 6; 02/2021 (poor prep) ?                          No polyps 2012 ?Medicines:                Monitored Anesthesia Care ?Procedure:                Pre-Anesthesia Assessment: ?                          - Prior to the procedure, a History and Physical  ?                          was performed, and patient medications and  ?                          allergies were reviewed. The patient's tolerance of  ?                          previous anesthesia was also reviewed. The risks  ?                          and benefits of the procedure and the sedation  ?                          options and risks were discussed with the patient.  ?                          All questions were answered, and informed consent  ?                          was obtained. Prior Anticoagulants: The patient has  ?                          taken no previous anticoagulant or antiplatelet  ?                          agents except for aspirin. ASA Grade Assessment:  ?                          III - A patient with severe systemic disease. After  ?  reviewing the risks and benefits, the patient was  ?                          deemed in satisfactory condition to undergo the  ?                          procedure. ?                          After obtaining informed consent, the colonoscope  ?                          was passed under direct vision. Throughout the  ?                          procedure, the patient's blood pressure, pulse, and  ?                          oxygen saturations were monitored  continuously. The  ?                          PCF-HQ190L Colonoscope was introduced through the  ?                          anus and advanced to the the cecum, identified by  ?                          appendiceal orifice and ileocecal valve. The  ?                          colonoscopy was performed without difficulty. The  ?                          patient tolerated the procedure well. The quality  ?                          of the bowel preparation was good. The ileocecal  ?                          valve, appendiceal orifice, and rectum were  ?                          photographed. The bowel preparation used was 2 day  ?                          Suprep/Miralax. ?Scope In: 3:15:11 PM ?Scope Out: 3:25:05 PM ?Scope Withdrawal Time: 0 hours 6 minutes 47 seconds  ?Total Procedure Duration: 0 hours 9 minutes 54 seconds  ?Findings:                 The digital rectal exam findings include decreased  ?                          sphincter tone. ?  Many diverticula were found in the left colon. ?                          The exam was otherwise without abnormality on  ?                          direct and retroflexion views. ?Complications:            No immediate complications. ?Estimated Blood Loss:     Estimated blood loss: none. ?Impression:               - Decreased sphincter tone found on digital rectal  ?                          exam. ?                          - Diverticulosis in the left colon. ?                          - The examination was otherwise normal on direct  ?                          and retroflexion views. ?                          - No specimens collected. ?Recommendation:           - Patient has a contact number available for  ?                          emergencies. The signs and symptoms of potential  ?                          delayed complications were discussed with the  ?                          patient. Return to normal activities tomorrow.  ?                           Written discharge instructions were provided to the  ?                          patient. ?                          - Resume previous diet. ?                          - Continue present medications. ?                          - No repeat routine surveillance colonoscopy  ?                          recommended due to age, today's findings and  ?  current guidelines. ?Shailee Foots L. Loletha Carrow, MD ?04/10/2022 3:29:56 PM ?This report has been signed electronically. ?

## 2022-04-10 NOTE — Progress Notes (Signed)
History and Physical: ? This patient presents for endoscopic testing for: ?Encounter Diagnosis  ?Name Primary?  ? Personal history of colonic polyps Yes  ? ? ?TA < 42m x 03 March 2021 (poor prep) ? ?ROS: ?Patient denies chest pain or shortness of breath ? ? ?Past Medical History: ?Past Medical History:  ?Diagnosis Date  [place]   ? 4.1 cm per 01-21-2022 chest ct  ? BPPV (benign paroxysmal positional vertigo)   ? COPD (ch[place] obstructive pulmonary disease) ([facility] 08/06/[place]  ? Diverticulitis   ? Fatty liver 05/28/2010  ? [facility] ? Fecal incontinence   ? weras depends  ? Genital herpes   ? GERD (gastroesophageal reflux disease)   ? on meds  ? Glaucoma   ? eye drops for tx both eyes  ? Helicobacter pylori infection   ? Hiatal hernia 05/28/2010  [facility] ? History of kidney stones   ? Hyperlipidemia   ? on meds  ? Hypertension   ? on meds  ? Hypothyroidism   ? on meds  ? Incontinence of urine in female   ? wears depends  ? Lesion of bladder   ? Lung nodule seen on imaging study   ? 9 mm  ? Meniere's disease   ? occ vertigo  ? OSA on CPAP 04/13/2019  ? does not uses cpap mild osa  ? Wears glasses   ? Wears partial dentures upper and lower   ? ? ? ?Past Surgical History: ?Past Surgical History:  ?Procedure Laterality Date  ? ABDOMINAL HYSTERECTOMY  1995  ? ANAL RECTAL MANOMETRY N/A 08/06/2016  ? Procedure: ANO RECTAL MANOMETRY;  Surgeon: HDoran Stabler MD;  [facility];  Service: Gastroenterology;  Laterality: N/A;  ? APPENDECTOMY  1989  ? BPPV    ? x 4 after L cochlear implant  ? BREAST SURGERY  1989  ? breast reduction  ? CATARACT EXTRACTION, BILATERAL    ? yrs ago  ? CHOLECYSTECTOMY  1989  ? COCHLEAR IMPLANT    ? Left/2009; Right/2012  ? COLONOSCOPY  2012  ? normal 02-2021 also and 02-03-2014  ? CYSTOSCOPY N/A 03/06/2022  ? Procedure: CYSTOSCOPY With biopsy;  Surgeon: SJaquita Folds MD;  [facility]   Service: Gynecology;  Laterality: N/A;  total time requested is 30 min  ? LAPAROSCOPIC SIGMOID COLECTOMY  2012  ? with takedown of splenic flexure; cystoscopy with bilateral ureteral stent placement  ? REDUCTION MAMMAPLASTY Bilateral 1989  ? [place] ? TOTAL THYROIDECTOMY  1995  ? WISDOM TOOTH EXTRACTION    ? yrs ago  ? ? ?Allergies: ?Allergies  ?Allergen Reactions  ? Morphine Nausea And Vomiting  ? Morphine And Related   ?  Nausea ?Can tolerate Dilaudid  ? Statins Other (See Comments)  ?  Joint pain   ? Tape Other (See Comments)  ?  Causes redness and will take skin when removed  ? Wellbutrin [Bupropion] Other (See Comments)  ?  Suicidal intensions  ? Denture Adhesive Rash  ? ? ?Outpatient Meds: ?Current Outpatient Medications  ?Medication Sig Dispense Refill  ? aspirin 81 MG chewable tablet Chew 81 mg by mouth daily.     ? bimatoprost (LUMIGAN) 0.01 % SOLN Place 1 drop into both eyes at bedtime.    ? CALCIUM PO Take 1 tablet by mouth daily.    ? Cholecalciferol (VITAMIN D3 PO) Take 1 tablet by  mouth daily.    ? Cyanocobalamin (VITAMIN B-12 PO) Take 1 tablet by mouth daily.    ? fluticasone (FLONASE) 50 MCG/ACT nasal spray Place 2 sprays into both nostrils at bedtime.    ? hydrochlorothiazide (HYDRODIURIL) 25 MG tablet TAKE 1 TABLET(25 MG) BY MOUTH DAILY 90 tablet 2  ? ibuprofen (ADVIL) 600 MG tablet Take 600 mg by mouth every 6 (six) hours as needed.    ? levothyroxine (SYNTHROID) 112 MCG tablet Take 1 tablet (112 mcg total) by mouth daily. 90 tablet 3  ? losartan (COZAAR) 100 MG tablet TAKE 1 TABLET(100 MG) BY MOUTH DAILY 90 tablet 2  ? Probiotic Product (PROBIOTIC ADVANCED PO) Take 1 capsule by mouth daily.    ? Tiotropium Bromide Monohydrate (SPIRIVA RESPIMAT) 2.5 MCG/ACT AERS Inhale 2 puffs into the lungs daily. 4 g 0  ? Vibegron 75 MG TABS Take 1 tablet by mouth daily. 30 tablet 5  ? ascorbic acid (VITAMIN C) 1000 MG tablet Take 500 mg by mouth daily.     ? meclizine (ANTIVERT) 25 MG tablet Take 1 tablet (25  mg total) by mouth 3 (three) times daily as needed for dizziness. 90 tablet 9  ? ?Current Facility-Administered Medications  ?Medication Dose Route Frequency Provider Last Rate Last Admin  ? 0.9 %  sodium chloride infusion  500 mL Intravenous Once Doran Stabler, MD      ? ? ? ? ?___________________________________________________________________ ?Objective  ? ?Exam: ? ?BP 108/60   Pulse 100   Temp 98.2 ?F (36.8 ?C) (Temporal)   Ht '5\' 1"'$  (1.549 m)   Wt 134 lb (60.8 kg)   SpO2 97%   BMI 25.32 kg/m?  ? ?CV: RRR without murmur, S1/S2 ?Resp: clear to auscultation bilaterally, normal RR and effort noted ?GI: soft, no tenderness, with active bowel sounds. ? ? ?Assessment: ?Encounter Diagnosis  ?Name Primary?  ? Personal history of colonic polyps Yes  ? ? ? ?Plan: ?Colonoscopy ? The benefits and risks of the planned procedure were described in detail with the patient or (when appropriate) their health care proxy.  Risks were outlined as including, but not limited to, bleeding, infection, perforation, adverse medication reaction leading to cardiac or pulmonary decompensation, pancreatitis (if ERCP).  The limitation of incomplete mucosal visualization was also discussed.  No guarantees or warranties were given. ? ? ? ?The patient is appropriate for an endoscopic procedure in the ambulatory setting. ? ? - Wilfrid Lund, MD ? ? ? ? ?

## 2022-04-10 NOTE — Progress Notes (Signed)
Pt non-responsive, VVS, Report to RN  °

## 2022-04-10 NOTE — Progress Notes (Signed)
Patient insists on dressing herself. ?

## 2022-04-10 NOTE — Patient Instructions (Signed)
Read all of the handouts given to you by your recovery room nurse. ? ?YOU HAD AN ENDOSCOPIC PROCEDURE TODAY AT [facility]:   Refer to the procedure report that was given to you for any specific questions about what was found during the examination.  If the procedure report does not answer your questions, please call your gastroenterologist to clarify.  If you requested that your care partner not be given the details of your procedure findings, then the procedure report has been included in a sealed envelope for you to review at your convenience later. ? ?YOU SHOULD EXPECT: Some feelings of bloating in the abdomen. Passage of more gas than usual.  Walking can help get rid of the air that was put into your GI tract during the procedure and reduce the bloating. If you had a lower endoscopy (such as a colonoscopy or flexible sigmoidoscopy) you may notice spotting of blood in your stool or on the toilet paper. If you underwent a bowel prep for your procedure, you may not have a normal bowel movement for a few days. ? ?Please Note:  You might notice some irritation and congestion in your nose or some drainage.  This is from the oxygen used during your procedure.  There is no need for concern and it should clear up in a day or so. ? ?SYMPTOMS TO REPORT IMMEDIATELY: ? ?Following lower endoscopy (colonoscopy or flexible sigmoidoscopy): ? Excessive amounts of blood in the stool ? Significant tenderness or worsening of abdominal pains ? Swelling of the abdomen that is new, acute ? Fever of 100?F or higher ? ? ?For urgent or emergent issues, a gastroenterologist can be reached at any hour by calling [phone]. ?Do not use MyChart messaging for urgent concerns.  ? ? ?DIET:  We do recommend a small meal at first, but then you may proceed to your regular diet.  Drink plenty of fluids but you should avoid alcoholic beverages for 24 hours. ? ?ACTIVITY:  You should plan to take it easy for the rest of today and  you should NOT DRIVE or use heavy machinery until tomorrow (because of the sedation medicines used during the test).   ? ?FOLLOW UP: ?Our staff will call the number listed on your records 48-72 hours following your procedure to check on you and address any questions or concerns that you may have regarding the information given to you following your procedure. If we do not reach you, we will leave a message.  We will attempt to reach you two times.  During this call, we will ask if you have developed any symptoms of COVID 19. If you develop any symptoms (ie: fever, flu-like symptoms, shortness of breath, cough etc.) before then, please call [phone].  If you test positive for Covid 19 in the 2 weeks post procedure, please call and report this information to [name].   ? ? ?SIGNATURES/CONFIDENTIALITY: ?You and/or your care partner have signed paperwork which will be entered into your electronic medical record.  These signatures attest to the fact that that the information above on your After Visit Summary has been reviewed and is understood.  Full responsibility of the confidentiality of this discharge information lies with you and/or your care-partner.  ?

## 2022-04-10 NOTE — Progress Notes (Signed)
Pt's states no medical or surgical changes since previsit or office visit. 

## 2022-04-14 ENCOUNTER — Telehealth: Payer: Self-pay

## 2022-04-14 ENCOUNTER — Other Ambulatory Visit: Payer: Self-pay | Admitting: Neurosurgery

## 2022-04-14 DIAGNOSIS — M5416 Radiculopathy, lumbar region: Secondary | ICD-10-CM

## 2022-04-14 NOTE — Telephone Encounter (Signed)
?  Follow up Call- ? ? ?  04/10/2022  ?  2:21 PM 03/18/2021  ? 12:40 PM 06/14/2020  ?  9:30 AM  ?Call back number  ?Post procedure Call Back phone  # [phone] (son) pt is [facility] [phone]  ?Permission to leave phone message Yes Yes   ?  ? ?Patient questions: ? ?Do you have a fever, pain , or abdominal swelling? No. ?Pain Score  0 * ? ?Have you tolerated food without any problems? Yes.   ? ?Have you been able to return to your normal activities? Yes.   ? ?Do you have any questions about your discharge instructions: ?Diet   No. ?Medications  No. ?Follow up visit  No. ? ?Do you have questions or concerns about your Care? No. ? ?Actions: ?* If pain score is 4 or above: ?No action needed, pain <4. ? ?I spoke with pt's son, [name] [phone].  He reports his mother is doing well. maw ? ? ?

## 2022-04-18 ENCOUNTER — Other Ambulatory Visit: Payer: Medicare Other

## 2022-04-21 ENCOUNTER — Other Ambulatory Visit: Payer: Medicare Other

## 2022-04-25 DIAGNOSIS — U071 COVID-19: Secondary | ICD-10-CM | POA: Diagnosis not present

## 2022-04-30 ENCOUNTER — Other Ambulatory Visit: Payer: Medicare Other

## 2022-05-12 ENCOUNTER — Ambulatory Visit
Admission: RE | Admit: 2022-05-12 | Discharge: 2022-05-12 | Disposition: A | Discharge status: Home / Self Care | Payer: Medicare Other | Source: Ambulatory Visit | Attending: Neurosurgery | Admitting: Neurosurgery

## 2022-05-12 DIAGNOSIS — M4316 Spondylolisthesis, lumbar region: Secondary | ICD-10-CM | POA: Diagnosis not present

## 2022-05-12 DIAGNOSIS — M5416 Radiculopathy, lumbar region: Secondary | ICD-10-CM

## 2022-05-12 DIAGNOSIS — M4326 Fusion of spine, lumbar region: Secondary | ICD-10-CM | POA: Diagnosis not present

## 2022-05-12 DIAGNOSIS — M48061 Spinal stenosis, lumbar region without neurogenic claudication: Secondary | ICD-10-CM | POA: Diagnosis not present

## 2022-05-12 DIAGNOSIS — M5126 Other intervertebral disc displacement, lumbar region: Secondary | ICD-10-CM | POA: Diagnosis not present

## 2022-05-12 MED ORDER — DIAZEPAM 5 MG PO TABS
5.0000 mg | ORAL_TABLET | Freq: Once | ORAL | Status: AC
  Administered 2022-05-12: 5 mg via ORAL

## 2022-05-12 MED ORDER — MEPERIDINE HCL 50 MG/ML IJ SOLN: 50.0000 mg | Freq: Once | INTRAMUSCULAR | Status: DC | PRN

## 2022-05-12 MED ORDER — IOPAMIDOL (ISOVUE-M 200) INJECTION 41%
15.0000 mL | Freq: Once | INTRAMUSCULAR | Status: AC
  Administered 2022-05-12: 15 mL via INTRATHECAL

## 2022-05-12 MED ORDER — ONDANSETRON HCL 4 MG/2ML IJ SOLN: 4.0000 mg | Freq: Once | INTRAMUSCULAR | Status: DC | PRN

## 2022-05-12 NOTE — Discharge Instructions (Signed)

## 2022-05-15 ENCOUNTER — Encounter (HOSPITAL_BASED_OUTPATIENT_CLINIC_OR_DEPARTMENT_OTHER): Payer: Self-pay | Admitting: Obstetrics and Gynecology

## 2022-05-15 ENCOUNTER — Encounter: Payer: Self-pay | Admitting: Obstetrics and Gynecology

## 2022-05-15 ENCOUNTER — Ambulatory Visit (INDEPENDENT_AMBULATORY_CARE_PROVIDER_SITE_OTHER): Payer: Medicare Other | Admitting: Obstetrics and Gynecology

## 2022-05-15 VITALS — BP 159/97 | HR 87

## 2022-05-15 DIAGNOSIS — Z01818 Encounter for other preprocedural examination: Secondary | ICD-10-CM

## 2022-05-15 DIAGNOSIS — R159 Full incontinence of feces: Secondary | ICD-10-CM

## 2022-05-15 DIAGNOSIS — N3281 Overactive bladder: Secondary | ICD-10-CM | POA: Diagnosis not present

## 2022-05-15 MED ORDER — SULFAMETHOXAZOLE-TRIMETHOPRIM 800-160 MG PO TABS: 1.0000 | ORAL_TABLET | Freq: Every day | ORAL | 0 refills | Status: AC

## 2022-05-15 MED ORDER — IBUPROFEN 600 MG PO TABS: 600.0000 mg | ORAL_TABLET | Freq: Four times a day (QID) | ORAL | 0 refills | Status: AC | PRN

## 2022-05-15 MED ORDER — ACETAMINOPHEN 500 MG PO TABS: 500.0000 mg | ORAL_TABLET | Freq: Four times a day (QID) | ORAL | 0 refills | Status: DC | PRN

## 2022-05-15 MED ORDER — TRAMADOL HCL 50 MG PO TABS: 50.0000 mg | ORAL_TABLET | Freq: Three times a day (TID) | ORAL | 0 refills | Status: AC | PRN

## 2022-05-15 NOTE — Progress Notes (Signed)
[facility] Pre-Operative visit  Subjective Chief Complaint: Rachel Gardner presents for a preoperative encounter.   History of Present Illness: Rachel Gardner is a 76 y.o. female who presents for preoperative visit.  She is scheduled to undergo Stage I sacral neuromodulation- Incision for implant of neurostimulator with fluroroscopic guidance on 05/19/22 and Stage II sacral neuromodulation- placement of implantable pulse generator on 06/02/22.  Her symptoms include urge incontinence and fecal incontinence.   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   Coronary artery calcification    cardiologist--- dr Marlou Porch   DDD (degenerative disc disease), lumbosacral    Fecal incontinence    intermittant , wears depends   Genital herpes    GERD (gastroesophageal reflux disease)    Glaucoma, both eyes    Hiatal hernia    History of adenomatous polyp of colon    History of diverticulitis of colon    w/ hx sugical intervention hemicolectomy in 7591   History of Helicobacter pylori infection    History of kidney stones    Hyperlipidemia, mixed    Hypertension    Hypothyroidism, postsurgical    endocrinologist-- dr Loanne Drilling;   s/p total thyroidectomy for goiter, per dr Loanne Drilling note benign   Incontinence of urine in female    wears depends   Lung nodule seen on imaging study    followed by dr Chase Caller;  per lov note chronic stable, LLL   Lymphadenopathy    followed by pcp;  evaluated by oncologist-- dr Cassell Smiles note 08-22-2019 in epic   Meniere's disease    OAB (overactive bladder)    OSA on CPAP    followed by dr t. Radford Pax;   sleep study in epic 10-22-2018  moderate osa   Pulmonary emphysema [facility])    pulmonology--- dr Jerilynn Mages. Chase Caller   Sensorineural hearing loss (SNHL) of both ears    s/p cochlear implant's   SOB (shortness of breath)    Thoracic ascending aortic  aneurysm ([place])    followed by dr Cyndia Bent--- 4.1 cm per 01-21-2022 chest ct   Wears glasses    Wears partial dentures upper and lower      Past Surgical History:  Procedure Laterality Date   ABDOMINAL HYSTERECTOMY  1995   ANAL RECTAL MANOMETRY N/A 08/06/2016   Procedure: ANO RECTAL MANOMETRY;  Surgeon: Doran Stabler, MD;  Location: [facility];  Service: Gastroenterology;  Laterality: N/A;   CATARACT EXTRACTION W/ INTRAOCULAR LENS IMPLANT     yrs ago   CHOLECYSTECTOMY OPEN  1989   W/ APPENDECTOMY   COCHLEAR IMPLANT Bilateral    Left/  2009;  Right / 2012   COLONOSCOPY WITH PROPOFOL  04/10/2022   by dr h. danis   CYSTOSCOPY N/A 03/06/2022   Procedure: CYSTOSCOPY With biopsy;  Surgeon: [name], MD;  Location: [facility];  Service: Gynecology;  Laterality: N/A;  total time requested is 30 min   ESOPHAGOGASTRODUODENOSCOPY  02/03/2014   by dr d. Olevia Perches   LAPAROSCOPIC SIGMOID COLECTOMY  02/2011   in [place];    with takedown of splenic flexure; cystoscopy with bilateral ureteral stent placement   REDUCTION MAMMAPLASTY Bilateral 1989   [place]   TOTAL THYROIDECTOMY Bilateral 1995   WISDOM TOOTH EXTRACTION     yrs ago    is allergic to morphine, morphine and related, statins, tape, wellbutrin [  bupropion], and denture adhesive.   Family History  Problem Relation Age of Onset   Hypertension Mother    Hypertension Father    Hypertension Sister    Thyroid disease Sister    Colon cancer Maternal Aunt    Prostate cancer Paternal Uncle    Colon cancer Cousin        x 2; paternal   Prostate cancer Brother    Hyperlipidemia Brother    Colon polyps Neg Hx    Esophageal cancer Neg Hx    Stomach cancer Neg Hx    Rectal cancer Neg Hx    Breast cancer Neg Hx     Social History   Tobacco Use   Smoking status: Former    Packs/day: 0.50    Years: 41.00    Pack years: 20.50    Types: Cigarettes    Quit date: 07/26/2021    Years since quitting: 0.8    Smokeless tobacco: Never   Tobacco comments:    Quit 4 months ago, 1/2 pack a day [facility] 11/01/21  Vaping Use   Vaping Use: Never used  Substance Use Topics   Alcohol use: Not Currently    Comment: very rare   Drug use: Never     Review of Systems was negative for a full 10 system review except as noted in the History of Present Illness.   Current Outpatient Medications:    ascorbic acid (VITAMIN C) 1000 MG tablet, Take 500 mg by mouth daily. , Disp: , Rfl:    aspirin 81 MG chewable tablet, Chew 81 mg by mouth daily. , Disp: , Rfl:    bimatoprost (LUMIGAN) 0.01 % SOLN, Place 1 drop into both eyes at bedtime., Disp: , Rfl:    CALCIUM PO, Take 1 tablet by mouth daily., Disp: , Rfl:    Cholecalciferol (VITAMIN D3 PO), Take 1 tablet by mouth daily., Disp: , Rfl:    Cyanocobalamin (VITAMIN B-12 PO), Take 1 tablet by mouth daily., Disp: , Rfl:    fluticasone (FLONASE) 50 MCG/ACT nasal spray, Place 2 sprays into both nostrils at bedtime., Disp: , Rfl:    hydrochlorothiazide (HYDRODIURIL) 25 MG tablet, TAKE 1 TABLET(25 MG) BY MOUTH DAILY, Disp: 90 tablet, Rfl: 2   ibuprofen (ADVIL) 600 MG tablet, Take 600 mg by mouth every 6 (six) hours as needed., Disp: , Rfl:    levothyroxine (SYNTHROID) 112 MCG tablet, Take 1 tablet (112 mcg total) by mouth daily., Disp: 90 tablet, Rfl: 3   losartan (COZAAR) 100 MG tablet, TAKE 1 TABLET(100 MG) BY MOUTH DAILY, Disp: 90 tablet, Rfl: 2   meclizine (ANTIVERT) 25 MG tablet, Take 1 tablet (25 mg total) by mouth 3 (three) times daily as needed for dizziness., Disp: 90 tablet, Rfl: 9   Probiotic Product (PROBIOTIC ADVANCED PO), Take 1 capsule by mouth daily., Disp: , Rfl:    Tiotropium Bromide Monohydrate (SPIRIVA RESPIMAT) 2.5 MCG/ACT AERS, Inhale 2 puffs into the lungs daily., Disp: 4 g, Rfl: 0   Objective Vitals:   05/15/22 1538  BP: (!) 159/97  Pulse: 87    Gen: NAD CV: S1 S2 RRR Lungs: Clear to auscultation bilaterally Abd: soft,  nontender  Assessment/ Plan  Assessment: The patient is a 76 y.o. year old scheduled to undergo Stage I sacral neuromodulation- Incision for implant of neurostimulator with fluroroscopic guidance on 05/19/22 and Stage II sacral neuromodulation- placement of implantable pulse generator on 06/02/22. . Verbal consent was obtained for these procedures.  Plan: General Surgical Consent:  The patient has previously been counseled on alternative treatments, and the decision by the patient and provider was to proceed with the procedure listed above.  For all procedures, there are risks of bleeding, infection, damage to surrounding organs including but not limited to bowel, bladder, blood vessels, ureters and nerves, and need for further surgery if an injury were to occur. These risks are all low with minimally invasive surgery.   There are risks of numbness and weakness at any body site or buttock/rectal pain.  It is possible that baseline pain can be worsened by surgery, either with or without mesh. If surgery is vaginal, there is also a low risk of possible conversion to laparoscopy or open abdominal incision where indicated. Very rare risks include blood transfusion, blood clot, heart attack, pneumonia, or death.   Pre-operative instructions:  She was instructed to not take Aspirin/NSAIDs x 7days prior to surgery. Antibiotic prophylaxis was ordered as indicated.  Post-operative instructions:  She was provided with specific post-operative instructions, including precautions and signs/symptoms for which we would recommend contacting us, in addition to daytime and after-hours contact phone numbers. This was provided on a handout.   Post-operative medications: Prescriptions for motrin, tylenol, bactrim and oxycodone were sent to her pharmacy. Discussed using ibuprofen and tylenol on a schedule to limit use of narcotics.   Laboratory testing:  none required  Preoperative clearance:  She does not require  surgical clearance.    Post-operative follow-up:  A post-operative appointment will be made for 6 weeks from the date of surgery. If she needs a post-operative nurse visit for a voiding trial, that will be set up after she leaves the hospital.    Patient will call the clinic or use MyChart should anything change or any new issues arise.   [name], MD  Time spent: I spent 25 minutes dedicated to the care of this patient on the date of this encounter to include pre-visit review of records, face-to-face time with the patient and post visit documentation and ordering medication/ testing.

## 2022-05-16 ENCOUNTER — Other Ambulatory Visit: Payer: Self-pay

## 2022-05-16 ENCOUNTER — Encounter (HOSPITAL_BASED_OUTPATIENT_CLINIC_OR_DEPARTMENT_OTHER): Payer: Self-pay | Admitting: Obstetrics and Gynecology

## 2022-05-16 ENCOUNTER — Encounter: Payer: Self-pay | Admitting: Obstetrics and Gynecology

## 2022-05-16 NOTE — H&P (Signed)
[facility] Pre-Operative H&P  Subjective Chief Complaint: Rachel Gardner presents for a preoperative encounter.   History of Present Illness: Rachel Gardner is a 76 y.o. female who presents for preoperative visit.  She is scheduled to undergo Stage I sacral neuromodulation- Incision for implant of neurostimulator with fluroroscopic guidance on 05/19/22 and Stage II sacral neuromodulation- placement of implantable pulse generator on 06/02/22.  Her symptoms include urge incontinence and fecal incontinence.   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   Coronary artery calcification    cardiologist--- dr Marlou Porch   DDD (degenerative disc disease), lumbosacral    Fecal incontinence    intermittant , wears depends   Genital herpes    GERD (gastroesophageal reflux disease)    Glaucoma, both eyes    Hiatal hernia    History of adenomatous polyp of colon    History of diverticulitis of colon    w/ hx sugical intervention hemicolectomy in 1610   History of Helicobacter pylori infection    History of kidney stones    Hyperlipidemia, mixed    Hypertension    Hypothyroidism, postsurgical    endocrinologist-- dr Loanne Drilling;   s/p total thyroidectomy for goiter, per dr Loanne Drilling note benign   Incontinence of urine in female    wears depends   Lung nodule seen on imaging study    followed by dr Chase Caller;  per lov note chronic stable, LLL   Lymphadenopathy    followed by pcp;  evaluated by oncologist-- dr Cassell Smiles note 08-22-2019 in epic   Meniere's disease    OAB (overactive bladder)    OSA on CPAP    followed by dr t. Radford Pax;   sleep study in epic 10-22-2018  moderate osa   Pulmonary emphysema [facility])    pulmonology--- dr Jerilynn Mages. Chase Caller   Sensorineural hearing loss (SNHL) of both ears    s/p cochlear implant's   SOB (shortness of breath)    Thoracic ascending aortic  aneurysm ([place])    followed by dr Cyndia Bent--- 4.1 cm per 01-21-2022 chest ct   Wears glasses    Wears partial dentures upper and lower      Past Surgical History:  Procedure Laterality Date   ABDOMINAL HYSTERECTOMY  1995   ANAL RECTAL MANOMETRY N/A 08/06/2016   Procedure: ANO RECTAL MANOMETRY;  Surgeon: Doran Stabler, MD;  Location: [facility];  Service: Gastroenterology;  Laterality: N/A;   CATARACT EXTRACTION W/ INTRAOCULAR LENS IMPLANT Bilateral    yrs ago   CHOLECYSTECTOMY OPEN  1989   W/ APPENDECTOMY   COCHLEAR IMPLANT Bilateral    Left/  2009;  Right / 2012   COLONOSCOPY WITH PROPOFOL  04/10/2022   by dr h. danis   CYSTOSCOPY N/A 03/06/2022   Procedure: CYSTOSCOPY With biopsy;  Surgeon: [name], MD;  Location: [facility];  Service: Gynecology;  Laterality: N/A;  total time requested is 30 min   ESOPHAGOGASTRODUODENOSCOPY  02/03/2014   by dr d. Olevia Perches   LAPAROSCOPIC SIGMOID COLECTOMY  02/2011   in [place];    with takedown of splenic flexure; cystoscopy with bilateral ureteral stent placement   REDUCTION MAMMAPLASTY Bilateral 1989   [place]   TOTAL THYROIDECTOMY Bilateral 1995   WISDOM TOOTH EXTRACTION     yrs ago    is allergic to morphine, morphine and related, statins, tape, wellbutrin [  bupropion], and denture adhesive.   Family History  Problem Relation Age of Onset   Hypertension Mother    Hypertension Father    Hypertension Sister    Thyroid disease Sister    Colon cancer Maternal Aunt    Prostate cancer Paternal Uncle    Colon cancer Cousin        x 2; paternal   Prostate cancer Brother    Hyperlipidemia Brother    Colon polyps Neg Hx    Esophageal cancer Neg Hx    Stomach cancer Neg Hx    Rectal cancer Neg Hx    Breast cancer Neg Hx     Social History   Tobacco Use   Smoking status: Former    Packs/day: 0.50    Years: 41.00    Pack years: 20.50    Types: Cigarettes    Quit date: 07/26/2021    Years since quitting: 0.8    Smokeless tobacco: Never   Tobacco comments:    Quit 4 months ago, 1/2 pack a day [facility] 11/01/21  Vaping Use   Vaping Use: Never used  Substance Use Topics   Alcohol use: Not Currently    Comment: very rare   Drug use: Never     Review of Systems was negative for a full 10 system review except as noted in the History of Present Illness.  No current facility-administered medications for this encounter.  Current Outpatient Medications:    ascorbic acid (VITAMIN C) 1000 MG tablet, Take 500 mg by mouth daily. , Disp: , Rfl:    aspirin 81 MG chewable tablet, Chew 81 mg by mouth daily. , Disp: , Rfl:    bimatoprost (LUMIGAN) 0.01 % SOLN, Place 1 drop into both eyes at bedtime., Disp: , Rfl:    CALCIUM PO, Take 1 tablet by mouth daily., Disp: , Rfl:    Cholecalciferol (VITAMIN D3 PO), Take 1 tablet by mouth daily., Disp: , Rfl:    Cyanocobalamin (VITAMIN B-12 PO), Take 1 tablet by mouth daily., Disp: , Rfl:    fluticasone (FLONASE) 50 MCG/ACT nasal spray, Place 2 sprays into both nostrils at bedtime., Disp: , Rfl:    hydrochlorothiazide (HYDRODIURIL) 25 MG tablet, TAKE 1 TABLET(25 MG) BY MOUTH DAILY, Disp: 90 tablet, Rfl: 2   ibuprofen (ADVIL) 600 MG tablet, Take 600 mg by mouth every 6 (six) hours as needed., Disp: , Rfl:    levothyroxine (SYNTHROID) 112 MCG tablet, Take 1 tablet (112 mcg total) by mouth daily., Disp: 90 tablet, Rfl: 3   losartan (COZAAR) 100 MG tablet, TAKE 1 TABLET(100 MG) BY MOUTH DAILY, Disp: 90 tablet, Rfl: 2   meclizine (ANTIVERT) 25 MG tablet, Take 1 tablet (25 mg total) by mouth 3 (three) times daily as needed for dizziness., Disp: 90 tablet, Rfl: 9   Probiotic Product (PROBIOTIC ADVANCED PO), Take 1 capsule by mouth daily., Disp: , Rfl:    Tiotropium Bromide Monohydrate (SPIRIVA RESPIMAT) 2.5 MCG/ACT AERS, Inhale 2 puffs into the lungs daily., Disp: 4 g, Rfl: 0   acetaminophen (TYLENOL) 500 MG tablet, Take 1 tablet (500 mg total) by mouth every 6 (six) hours as  needed (pain). (Patient not taking: Reported on 05/16/2022), Disp: 30 tablet, Rfl: 0   ibuprofen (ADVIL) 600 MG tablet, Take 1 tablet (600 mg total) by mouth every 6 (six) hours as needed. (Patient not taking: Reported on 05/16/2022), Disp: 30 tablet, Rfl: 0   sulfamethoxazole-trimethoprim (BACTRIM DS) 800-160 MG tablet, Take 1 tablet by mouth daily for  14 days. (Patient not taking: Reported on 05/16/2022), Disp: 14 tablet, Rfl: 0   traMADol (ULTRAM) 50 MG tablet, Take 1 tablet (50 mg total) by mouth every 8 (eight) hours as needed for up to 5 days. (Patient not taking: Reported on 05/16/2022), Disp: 5 tablet, Rfl: 0   Objective There were no vitals filed for this visit.   Gen: NAD CV: S1 S2 RRR Lungs: Clear to auscultation bilaterally Abd: soft, nontender  Assessment/ Plan  The patient is a 76 y.o. year old scheduled to undergo Stage I sacral neuromodulation- Incision for implant of neurostimulator with fluroroscopic guidance on 05/19/22 and Stage II sacral neuromodulation- placement of implantable pulse generator on 06/02/22. [name], MD

## 2022-05-16 NOTE — H&P (View-Only) (Signed)
[facility] Pre-Operative H&P  Subjective Chief Complaint: Rachel Gardner presents for a preoperative encounter.   History of Present Illness: Rachel Gardner is a 76 y.o. female who presents for preoperative visit.  She is scheduled to undergo Stage I sacral neuromodulation- Incision for implant of neurostimulator with fluroroscopic guidance on 05/19/22 and Stage II sacral neuromodulation- placement of implantable pulse generator on 06/02/22.  Her symptoms include urge incontinence and fecal incontinence.   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   Coronary artery calcification    cardiologist--- dr Marlou Porch   DDD (degenerative disc disease), lumbosacral    Fecal incontinence    intermittant , wears depends   Genital herpes    GERD (gastroesophageal reflux disease)    Glaucoma, both eyes    Hiatal hernia    History of adenomatous polyp of colon    History of diverticulitis of colon    w/ hx sugical intervention hemicolectomy in 2706   History of Helicobacter pylori infection    History of kidney stones    Hyperlipidemia, mixed    Hypertension    Hypothyroidism, postsurgical    endocrinologist-- dr Loanne Drilling;   s/p total thyroidectomy for goiter, per dr Loanne Drilling note benign   Incontinence of urine in female    wears depends   Lung nodule seen on imaging study    followed by dr Chase Caller;  per lov note chronic stable, LLL   Lymphadenopathy    followed by pcp;  evaluated by oncologist-- dr Cassell Smiles note 08-22-2019 in epic   Meniere's disease    OAB (overactive bladder)    OSA on CPAP    followed by dr t. Radford Pax;   sleep study in epic 10-22-2018  moderate osa   Pulmonary emphysema [facility])    pulmonology--- dr Jerilynn Mages. Chase Caller   Sensorineural hearing loss (SNHL) of both ears    s/p cochlear implant's   SOB (shortness of breath)    Thoracic ascending aortic  aneurysm ([place])    followed by dr Cyndia Bent--- 4.1 cm per 01-21-2022 chest ct   Wears glasses    Wears partial dentures upper and lower      Past Surgical History:  Procedure Laterality Date   ABDOMINAL HYSTERECTOMY  1995   ANAL RECTAL MANOMETRY N/A 08/06/2016   Procedure: ANO RECTAL MANOMETRY;  Surgeon: Doran Stabler, MD;  Location: [facility];  Service: Gastroenterology;  Laterality: N/A;   CATARACT EXTRACTION W/ INTRAOCULAR LENS IMPLANT Bilateral    yrs ago   CHOLECYSTECTOMY OPEN  1989   W/ APPENDECTOMY   COCHLEAR IMPLANT Bilateral    Left/  2009;  Right / 2012   COLONOSCOPY WITH PROPOFOL  04/10/2022   by dr h. danis   CYSTOSCOPY N/A 03/06/2022   Procedure: CYSTOSCOPY With biopsy;  Surgeon: [name], MD;  Location: [facility];  Service: Gynecology;  Laterality: N/A;  total time requested is 30 min   ESOPHAGOGASTRODUODENOSCOPY  02/03/2014   by dr d. Olevia Perches   LAPAROSCOPIC SIGMOID COLECTOMY  02/2011   in [place];    with takedown of splenic flexure; cystoscopy with bilateral ureteral stent placement   REDUCTION MAMMAPLASTY Bilateral 1989   [place]   TOTAL THYROIDECTOMY Bilateral 1995   WISDOM TOOTH EXTRACTION     yrs ago    is allergic to morphine, morphine and related, statins, tape, wellbutrin [  bupropion], and denture adhesive.   Family History  Problem Relation Age of Onset   Hypertension Mother    Hypertension Father    Hypertension Sister    Thyroid disease Sister    Colon cancer Maternal Aunt    Prostate cancer Paternal Uncle    Colon cancer Cousin        x 2; paternal   Prostate cancer Brother    Hyperlipidemia Brother    Colon polyps Neg Hx    Esophageal cancer Neg Hx    Stomach cancer Neg Hx    Rectal cancer Neg Hx    Breast cancer Neg Hx     Social History   Tobacco Use   Smoking status: Former    Packs/day: 0.50    Years: 41.00    Pack years: 20.50    Types: Cigarettes    Quit date: 07/26/2021    Years since quitting: 0.8    Smokeless tobacco: Never   Tobacco comments:    Quit 4 months ago, 1/2 pack a day [facility] 11/01/21  Vaping Use   Vaping Use: Never used  Substance Use Topics   Alcohol use: Not Currently    Comment: very rare   Drug use: Never     Review of Systems was negative for a full 10 system review except as noted in the History of Present Illness.  No current facility-administered medications for this encounter.  Current Outpatient Medications:    ascorbic acid (VITAMIN C) 1000 MG tablet, Take 500 mg by mouth daily. , Disp: , Rfl:    aspirin 81 MG chewable tablet, Chew 81 mg by mouth daily. , Disp: , Rfl:    bimatoprost (LUMIGAN) 0.01 % SOLN, Place 1 drop into both eyes at bedtime., Disp: , Rfl:    CALCIUM PO, Take 1 tablet by mouth daily., Disp: , Rfl:    Cholecalciferol (VITAMIN D3 PO), Take 1 tablet by mouth daily., Disp: , Rfl:    Cyanocobalamin (VITAMIN B-12 PO), Take 1 tablet by mouth daily., Disp: , Rfl:    fluticasone (FLONASE) 50 MCG/ACT nasal spray, Place 2 sprays into both nostrils at bedtime., Disp: , Rfl:    hydrochlorothiazide (HYDRODIURIL) 25 MG tablet, TAKE 1 TABLET(25 MG) BY MOUTH DAILY, Disp: 90 tablet, Rfl: 2   ibuprofen (ADVIL) 600 MG tablet, Take 600 mg by mouth every 6 (six) hours as needed., Disp: , Rfl:    levothyroxine (SYNTHROID) 112 MCG tablet, Take 1 tablet (112 mcg total) by mouth daily., Disp: 90 tablet, Rfl: 3   losartan (COZAAR) 100 MG tablet, TAKE 1 TABLET(100 MG) BY MOUTH DAILY, Disp: 90 tablet, Rfl: 2   meclizine (ANTIVERT) 25 MG tablet, Take 1 tablet (25 mg total) by mouth 3 (three) times daily as needed for dizziness., Disp: 90 tablet, Rfl: 9   Probiotic Product (PROBIOTIC ADVANCED PO), Take 1 capsule by mouth daily., Disp: , Rfl:    Tiotropium Bromide Monohydrate (SPIRIVA RESPIMAT) 2.5 MCG/ACT AERS, Inhale 2 puffs into the lungs daily., Disp: 4 g, Rfl: 0   acetaminophen (TYLENOL) 500 MG tablet, Take 1 tablet (500 mg total) by mouth every 6 (six) hours as  needed (pain). (Patient not taking: Reported on 05/16/2022), Disp: 30 tablet, Rfl: 0   ibuprofen (ADVIL) 600 MG tablet, Take 1 tablet (600 mg total) by mouth every 6 (six) hours as needed. (Patient not taking: Reported on 05/16/2022), Disp: 30 tablet, Rfl: 0   sulfamethoxazole-trimethoprim (BACTRIM DS) 800-160 MG tablet, Take 1 tablet by mouth daily for  14 days. (Patient not taking: Reported on 05/16/2022), Disp: 14 tablet, Rfl: 0   traMADol (ULTRAM) 50 MG tablet, Take 1 tablet (50 mg total) by mouth every 8 (eight) hours as needed for up to 5 days. (Patient not taking: Reported on 05/16/2022), Disp: 5 tablet, Rfl: 0   Objective There were no vitals filed for this visit.   Gen: NAD CV: S1 S2 RRR Lungs: Clear to auscultation bilaterally Abd: soft, nontender  Assessment/ Plan  The patient is a 76 y.o. year old scheduled to undergo Stage I sacral neuromodulation- Incision for implant of neurostimulator with fluroroscopic guidance on 05/19/22 and Stage II sacral neuromodulation- placement of implantable pulse generator on 06/02/22. [name], MD

## 2022-05-16 NOTE — Progress Notes (Signed)
Spoke w/ via phone for pre-op interview--- pt and pt's son, Rachel Gardner, due to pt hearing impaired on the phone Lab needs dos----   [employer] results------ current ekg in epic/ chart COVID test -----patient states asymptomatic no test needed Arrive at ------- 0730 on 05-19-2022 NPO after MN NO Solid Food.  Clear liquids from MN until--- 0630 Med rec completed Medications to take morning of surgery ----- spirivia inhaler, synthroid Diabetic medication ----- n/a Patient instructed no nail polish to be worn day of surgery Patient instructed to bring photo id and insurance card day of surgery Patient aware to have Driver (ride ) son, Rachel Gardner/ caregiver for 24 hours after surgery --- pt's sister-n-law, Rachel Gardner Patient Special Instructions ----- n/a Pre-Op special Istructions -----  pt speaks very good english, uses cochlear hearing aids to hear Patient verbalized understanding of instructions that were given at this phone interview. Patient denies shortness of breath, chest pain, fever, cough at this phone interview.

## 2022-05-19 ENCOUNTER — Ambulatory Visit (HOSPITAL_BASED_OUTPATIENT_CLINIC_OR_DEPARTMENT_OTHER): Payer: Medicare Other | Admitting: Anesthesiology

## 2022-05-19 ENCOUNTER — Other Ambulatory Visit: Payer: Self-pay

## 2022-05-19 ENCOUNTER — Encounter (HOSPITAL_BASED_OUTPATIENT_CLINIC_OR_DEPARTMENT_OTHER): Payer: Self-pay | Admitting: Obstetrics and Gynecology

## 2022-05-19 ENCOUNTER — Ambulatory Visit (HOSPITAL_BASED_OUTPATIENT_CLINIC_OR_DEPARTMENT_OTHER)
Admission: RE | Admit: 2022-05-19 | Discharge: 2022-05-19 | Disposition: A | Discharge status: Home / Self Care | Payer: Medicare Other | Attending: Obstetrics and Gynecology | Admitting: Obstetrics and Gynecology

## 2022-05-19 ENCOUNTER — Ambulatory Visit (HOSPITAL_COMMUNITY): Payer: Medicare Other

## 2022-05-19 ENCOUNTER — Encounter (HOSPITAL_BASED_OUTPATIENT_CLINIC_OR_DEPARTMENT_OTHER)
Admission: RE | Disposition: A | Discharge status: Home / Self Care | Payer: Self-pay | Source: Home / Self Care | Attending: Obstetrics and Gynecology

## 2022-05-19 ENCOUNTER — Telehealth: Payer: Self-pay | Admitting: Obstetrics and Gynecology

## 2022-05-19 DIAGNOSIS — I2510 Atherosclerotic heart disease of native coronary artery without angina pectoris: Secondary | ICD-10-CM | POA: Diagnosis not present

## 2022-05-19 DIAGNOSIS — E039 Hypothyroidism, unspecified: Secondary | ICD-10-CM | POA: Diagnosis not present

## 2022-05-19 DIAGNOSIS — N3941 Urge incontinence: Secondary | ICD-10-CM

## 2022-05-19 DIAGNOSIS — Z9621 Cochlear implant status: Secondary | ICD-10-CM | POA: Diagnosis not present

## 2022-05-19 DIAGNOSIS — Z87891 Personal history of nicotine dependence: Secondary | ICD-10-CM | POA: Diagnosis not present

## 2022-05-19 DIAGNOSIS — I7121 Aneurysm of the ascending aorta, without rupture: Secondary | ICD-10-CM | POA: Insufficient documentation

## 2022-05-19 DIAGNOSIS — G4730 Sleep apnea, unspecified: Secondary | ICD-10-CM | POA: Diagnosis not present

## 2022-05-19 DIAGNOSIS — J449 Chronic obstructive pulmonary disease, unspecified: Secondary | ICD-10-CM | POA: Insufficient documentation

## 2022-05-19 DIAGNOSIS — R159 Full incontinence of feces: Secondary | ICD-10-CM | POA: Diagnosis not present

## 2022-05-19 DIAGNOSIS — Z01818 Encounter for other preprocedural examination: Secondary | ICD-10-CM

## 2022-05-19 DIAGNOSIS — I10 Essential (primary) hypertension: Secondary | ICD-10-CM

## 2022-05-19 DIAGNOSIS — G4733 Obstructive sleep apnea (adult) (pediatric): Secondary | ICD-10-CM | POA: Diagnosis not present

## 2022-05-19 DIAGNOSIS — Z9989 Dependence on other enabling machines and devices: Secondary | ICD-10-CM | POA: Diagnosis not present

## 2022-05-19 HISTORY — DX: Personal history of adenomatous and serrated colon polyps: Z86.0101

## 2022-05-19 HISTORY — DX: Postprocedural hypothyroidism: E89.0

## 2022-05-19 HISTORY — DX: Generalized enlarged lymph nodes: R59.1

## 2022-05-19 HISTORY — DX: Other intervertebral disc degeneration, lumbosacral region: M51.37

## 2022-05-19 HISTORY — DX: Personal history of other diseases of the digestive system: Z87.19

## 2022-05-19 HISTORY — DX: Emphysema, unspecified: J43.9

## 2022-05-19 HISTORY — DX: Mixed hyperlipidemia: E78.2

## 2022-05-19 HISTORY — DX: Personal history of other infectious and parasitic diseases: Z86.19

## 2022-05-19 HISTORY — DX: Other intervertebral disc degeneration, lumbosacral region without mention of lumbar back pain or lower extremity pain: M51.379

## 2022-05-19 HISTORY — PX: INTERSTIM IMPLANT PLACEMENT: SHX5130

## 2022-05-19 HISTORY — DX: Overactive bladder: N32.81

## 2022-05-19 HISTORY — DX: Atherosclerotic heart disease of native coronary artery without angina pectoris: I25.10

## 2022-05-19 HISTORY — DX: Aneurysm of the ascending aorta, without rupture: I71.21

## 2022-05-19 HISTORY — DX: Shortness of breath: R06.02

## 2022-05-19 HISTORY — DX: Sensorineural hearing loss, bilateral: H90.3

## 2022-05-19 HISTORY — DX: Unspecified glaucoma: H40.9

## 2022-05-19 HISTORY — DX: Cochlear implant status: Z96.21

## 2022-05-19 HISTORY — DX: Personal history of colonic polyps: Z86.010

## 2022-05-19 LAB — POCT I-STAT, CHEM 8
BUN: 22 mg/dL (ref 8–23)
Calcium, Ion: 1.28 mmol/L (ref 1.15–1.40)
Chloride: 105 mmol/L (ref 98–111)
Creatinine, Ser: 0.9 mg/dL (ref 0.44–1.00)
Glucose, Bld: 102 mg/dL — ABNORMAL HIGH (ref 70–99)
HCT: 38 % (ref 36.0–46.0)
Hemoglobin: 12.9 g/dL (ref 12.0–15.0)
Potassium: 3.5 mmol/L (ref 3.5–5.1)
Sodium: 143 mmol/L (ref 135–145)
TCO2: 24 mmol/L (ref 22–32)

## 2022-05-19 SURGERY — INSERTION, SACRAL NERVE STIMULATOR, INTERSTIM, STAGE 1
Anesthesia: Monitor Anesthesia Care

## 2022-05-19 MED ORDER — ONDANSETRON HCL 4 MG/2ML IJ SOLN
INTRAMUSCULAR | Status: DC | PRN
  Administered 2022-05-19: 4 mg via INTRAVENOUS

## 2022-05-19 MED ORDER — CEFAZOLIN SODIUM-DEXTROSE 2-4 GM/100ML-% IV SOLN
2.0000 g | INTRAVENOUS | Status: AC
  Administered 2022-05-19: 2 g via INTRAVENOUS

## 2022-05-19 MED ORDER — GENTAMICIN SULFATE 40 MG/ML IJ SOLN
1.5000 mg/kg | INTRAVENOUS | Status: AC
  Administered 2022-05-19: 92 mg via INTRAVENOUS
  Filled 2022-05-19: qty 2.25

## 2022-05-19 MED ORDER — EPHEDRINE 5 MG/ML INJ
INTRAVENOUS | Status: AC
  Filled 2022-05-19: qty 5

## 2022-05-19 MED ORDER — PROPOFOL 1000 MG/100ML IV EMUL
INTRAVENOUS | Status: AC
  Filled 2022-05-19: qty 100

## 2022-05-19 MED ORDER — ACETAMINOPHEN 500 MG PO TABS
1000.0000 mg | ORAL_TABLET | Freq: Once | ORAL | Status: AC
  Administered 2022-05-19: 1000 mg via ORAL

## 2022-05-19 MED ORDER — ONDANSETRON HCL 4 MG/2ML IJ SOLN
INTRAMUSCULAR | Status: AC
  Filled 2022-05-19: qty 2

## 2022-05-19 MED ORDER — PROPOFOL 500 MG/50ML IV EMUL
INTRAVENOUS | Status: DC | PRN
  Administered 2022-05-19: 50 ug/kg/min via INTRAVENOUS

## 2022-05-19 MED ORDER — EPHEDRINE SULFATE (PRESSORS) 50 MG/ML IJ SOLN
INTRAMUSCULAR | Status: DC | PRN
  Administered 2022-05-19: 10 mg via INTRAVENOUS

## 2022-05-19 MED ORDER — CEFAZOLIN SODIUM-DEXTROSE 2-4 GM/100ML-% IV SOLN
INTRAVENOUS | Status: AC
  Filled 2022-05-19: qty 100

## 2022-05-19 MED ORDER — LIDOCAINE HCL (PF) 2 % IJ SOLN
INTRAMUSCULAR | Status: AC
  Filled 2022-05-19: qty 5

## 2022-05-19 MED ORDER — BUPIVACAINE HCL 0.25 % IJ SOLN
INTRAMUSCULAR | Status: DC | PRN
  Administered 2022-05-19: 34 mL

## 2022-05-19 MED ORDER — WATER FOR IRRIGATION, STERILE IR SOLN
Status: DC | PRN
  Administered 2022-05-19: 500 mL via SURGICAL_CAVITY

## 2022-05-19 MED ORDER — FENTANYL CITRATE (PF) 100 MCG/2ML IJ SOLN
INTRAMUSCULAR | Status: AC
  Filled 2022-05-19: qty 2

## 2022-05-19 MED ORDER — LACTATED RINGERS IV SOLN: INTRAVENOUS | Status: DC

## 2022-05-19 MED ORDER — FENTANYL CITRATE (PF) 100 MCG/2ML IJ SOLN: 25.0000 ug | INTRAMUSCULAR | Status: DC | PRN

## 2022-05-19 MED ORDER — ACETAMINOPHEN 500 MG PO TABS
ORAL_TABLET | ORAL | Status: AC
  Filled 2022-05-19: qty 2

## 2022-05-19 SURGICAL SUPPLY — 46 items
APL PRP STRL LF DISP 70% ISPRP (MISCELLANEOUS) ×1
BLADE SURG 11 STRL SS (BLADE) ×2 IMPLANT
BLADE SURG 15 STRL LF DISP TIS (BLADE) ×1 IMPLANT
BLADE SURG 15 STRL SS (BLADE) ×2
CABLE EXTEN 4.32 INTERSTIM (NEUROSURGERY SUPPLIES) ×1 IMPLANT
CHLORAPREP W/TINT 26 (MISCELLANEOUS) ×2 IMPLANT
COVER BACK TABLE 60X90IN (DRAPES) ×2 IMPLANT
COVER MAYO STAND STRL (DRAPES) ×2 IMPLANT
COVER PROBE U/S 5X48 (MISCELLANEOUS) IMPLANT
DRAPE 3/4 80X56 (DRAPES) ×2 IMPLANT
DRAPE C-ARM 42X120 X-RAY (DRAPES) ×2 IMPLANT
DRAPE LAPAROSCOPIC ABDOMINAL (DRAPES) ×2 IMPLANT
DRAPE UTILITY 15X26 TOWEL STRL (DRAPES) ×2 IMPLANT
DRSG TEGADERM 4X4.75 (GAUZE/BANDAGES/DRESSINGS) ×1 IMPLANT
DRSG TEGADERM 8X12 (GAUZE/BANDAGES/DRESSINGS) ×1 IMPLANT
DRSG TELFA 3X8 NADH (GAUZE/BANDAGES/DRESSINGS) ×2 IMPLANT
DRSG TELFA 3X8 NADH STRL (GAUZE/BANDAGES/DRESSINGS) ×1 IMPLANT
ELECT REM PT RETURN 9FT ADLT (ELECTROSURGICAL) ×2
ELECTRODE REM PT RTRN 9FT ADLT (ELECTROSURGICAL) ×1 IMPLANT
GAUZE 4X4 16PLY ~~LOC~~+RFID DBL (SPONGE) ×2 IMPLANT
GAUZE SPONGE 2X2 12PLY NS (GAUZE/BANDAGES/DRESSINGS) ×2 IMPLANT
GLOVE BIO SURGEON STRL SZ 6 (GLOVE) ×2 IMPLANT
GLOVE BIOGEL PI IND STRL 6.5 (GLOVE) ×1 IMPLANT
GLOVE BIOGEL PI INDICATOR 6.5 (GLOVE) ×1
GOWN STRL REUS W/TWL LRG LVL3 (GOWN DISPOSABLE) ×2 IMPLANT
INTERSTIM PERCUTANOUS EXTENSION ×1 IMPLANT
KIT HANDSET INTERSTIM COMM (NEUROSURGERY SUPPLIES) ×1 IMPLANT
KIT TURNOVER CYSTO (KITS) ×2 IMPLANT
LEAD INTERSTIM 4.32 28 L (Lead) ×1 IMPLANT
NEEDLE HYPO 22GX1.5 SAFETY (NEEDLE) ×2 IMPLANT
NEUROSTIMULATOR 1.7X2X.06 (UROLOGICAL SUPPLIES) ×1 IMPLANT
PACK BASIN DAY SURGERY FS (CUSTOM PROCEDURE TRAY) ×2 IMPLANT
PAD DRESSING TELFA 3X8 NADH (GAUZE/BANDAGES/DRESSINGS) ×1 IMPLANT
PENCIL SMOKE EVACUATOR (MISCELLANEOUS) ×2 IMPLANT
SUT MNCRL AB 4-0 PS2 18 (SUTURE) IMPLANT
SUT SILK 2 0 SH (SUTURE) ×2 IMPLANT
SUT VIC AB 2-0 SH 27 (SUTURE) ×2
SUT VIC AB 2-0 SH 27XBRD (SUTURE) ×1 IMPLANT
SYR BULB IRRIG 60ML STRL (SYRINGE) ×2 IMPLANT
SYR CONTROL 10ML LL (SYRINGE) ×2 IMPLANT
TAPE CLOTH SURG 4X10 WHT LF (GAUZE/BANDAGES/DRESSINGS) ×1 IMPLANT
TOWEL OR 17X26 10 PK STRL BLUE (TOWEL DISPOSABLE) ×2 IMPLANT
TUBE CONNECTING 12X1/4 (SUCTIONS) ×2 IMPLANT
UNDERPAD 30X36 HEAVY ABSORB (UNDERPADS AND DIAPERS) ×2 IMPLANT
WATER STERILE IRR 500ML POUR (IV SOLUTION) ×2 IMPLANT
YANKAUER SUCT BULB TIP NO VENT (SUCTIONS) ×2 IMPLANT

## 2022-05-19 NOTE — Anesthesia Preprocedure Evaluation (Addendum)
Anesthesia Evaluation  Patient identified by MRN, date of birth, ID band Patient awake    Reviewed: Allergy & Precautions, NPO status , Patient's Chart, lab work & pertinent test results  Airway Mallampati: II  TM Distance: >3 FB Neck ROM: Full    Dental  (+) Partial Upper, Partial Lower, Dental Advisory Given   Pulmonary sleep apnea and Continuous Positive Airway Pressure Ventilation , COPD, former smoker,    Pulmonary exam normal breath sounds clear to auscultation       Cardiovascular hypertension, + CAD  Normal cardiovascular exam Rhythm:Regular Rate:Normal  Thoracic ascending aortic aneurysm 4.1cm  TTE 2022 1. Left ventricular ejection fraction, by estimation, is >75%. The left  ventricle has hyperdynamic function. The left ventricle has no regional  wall motion abnormalities. Left ventricular diastolic parameters are  consistent with Grade I diastolic  dysfunction (impaired relaxation). The average left ventricular global  longitudinal strain is 21.9 %. The global longitudinal strain is normal.  2. Right ventricular systolic function is normal. The right ventricular  size is normal.  3. The mitral valve is normal in structure. No evidence of mitral valve  regurgitation. No evidence of mitral stenosis.  4. The aortic valve is normal in structure. Aortic valve regurgitation is  not visualized. No aortic stenosis is present.  5. Aortic dilatation noted. There is borderline dilatation of the aortic  root, measuring 38 mm. There is mild dilatation of the ascending aorta,  measuring 40 mm.  6. The inferior vena cava is normal in size with greater than 50%  respiratory variability, suggesting right atrial pressure of 3 mmHg.   Stress Test 2020 Nuclear stress EF: 67%. There were no wall motion abnormalities The left ventricular ejection fraction is normal (55-65%). There was no ST segment deviation noted during  stress. The study is normal. This is a low risk study.    Neuro/Psych BPPV, sensorineural hearing loss s/p cochlear implants  Neuromuscular disease negative psych ROS   GI/Hepatic Neg liver ROS, hiatal hernia, GERD  ,  Endo/Other  Hypothyroidism   Renal/GU negative Renal ROS  negative genitourinary   Musculoskeletal  (+) Arthritis ,   Abdominal   Peds  Hematology negative hematology ROS (+)   Anesthesia Other Findings   Reproductive/Obstetrics                            Anesthesia Physical Anesthesia Plan  ASA: 3  Anesthesia Plan: MAC   Post-op Pain Management: Tylenol PO (pre-op)*   Induction: Intravenous  PONV Risk Score and Plan: 2 and Propofol infusion, Treatment may vary due to age or medical condition and Ondansetron  Airway Management Planned: Natural Airway  Additional Equipment:   Intra-op Plan:   Post-operative Plan:   Informed Consent: I have reviewed the patients History and Physical, chart, labs and discussed the procedure including the risks, benefits and alternatives for the proposed anesthesia with the patient or authorized representative who has indicated his/her understanding and acceptance.     Dental advisory given  Plan Discussed with: CRNA  Anesthesia Plan Comments:         Anesthesia Quick Evaluation

## 2022-05-19 NOTE — Interval H&P Note (Signed)
History and Physical Interval Note:  05/19/2022 9:32 AM  Rachel Gardner  has presented today for surgery, with the diagnosis of urge incontinence; fecal incontinence.  The various methods of treatment have been discussed with the patient and family. After consideration of risks, benefits and other options for treatment, the patient has consented to  Procedure(s): INTERSTIM IMPLANT FIRST STAGE (N/A) as a surgical intervention.  The patient's history has been reviewed, patient examined, no change in status, stable for surgery.  I have reviewed the patient's chart and labs.  Questions were answered to the patient's satisfaction.     [name]

## 2022-05-19 NOTE — Telephone Encounter (Signed)
Rachel Gardner underwent Interstim Stage 1 on 05/19/22.   She did not require a voiding trial.   Please call her for a routine post op check. Thanks!  [name], MD

## 2022-05-19 NOTE — Anesthesia Postprocedure Evaluation (Signed)
Anesthesia Post Note  Patient: Nanetta Batty  Procedure(s) Performed: Barrie Lyme IMPLANT FIRST STAGE     Patient location during evaluation: PACU Anesthesia Type: MAC Level of consciousness: awake and alert Pain management: pain level controlled Vital Signs Assessment: post-procedure vital signs reviewed and stable Respiratory status: spontaneous breathing, nonlabored ventilation, respiratory function stable and patient connected to nasal cannula oxygen Cardiovascular status: stable and blood pressure returned to baseline Postop Assessment: no apparent nausea or vomiting Anesthetic complications: no   No notable events documented.  Last Vitals:  Vitals:   05/19/22 1145 05/19/22 1200  BP: (!) 149/92 (!) 151/84  Pulse: 73 71  Resp: 16 18  Temp:  (!) 36.4 C  SpO2: 92% 94%    Last Pain:  Vitals:   05/19/22 1200  TempSrc:   PainSc: 0-No pain                 Isabelle Matt L [place]

## 2022-05-19 NOTE — Transfer of Care (Signed)
Immediate Anesthesia Transfer of Care Note  Patient: Rachel Gardner  Procedure(s) Performed: Procedure(s) (LRB): INTERSTIM IMPLANT FIRST STAGE (N/A)  Patient Location: PACU  Anesthesia Type: MAC  Level of Consciousness: awake, sedated, patient cooperative and responds to stimulation  Airway & Oxygen Therapy: Patient Spontanous Breathing and Patient on RA   Post-op Assessment: Report given to PACU RN, Post -op Vital signs reviewed and stable and Patient moving all extremities  Post vital signs: Reviewed and stable  Complications: No apparent anesthesia complications

## 2022-05-19 NOTE — Discharge Instructions (Addendum)
POST OPERATIVE SACRAL NEUROMODULATION INSTRUCTIONS  General Instructions Recovery (not bed rest) will last approximately 1 weeks Walking is encouraged, but refrain from strenuous exercise/ housework/ heavy lifting. Bathing:  Do not submerge in water (NO swimming, bath, hot tub, etc) until after your postop visit.  Stage 1 You can sponge bath around the incision or shower only on the front without wetting the back.  Do not remove bandages. Ensure they are well taped and secured on the back. If bandages start to come off, please call the office Stage 2 Wait 48 hours to remove bandages You may shower after bandages are removed. You can wash with soap and water but do not scrub the incision.  No driving until you are not taking narcotic pain medicine and until your pain is well enough controlled that you can slam on the breaks or make sudden movements if needed.   Taking your medications Please take your acetaminophen and ibuprofen on a schedule for the first 48 hours. Take 629m ibuprofen, then take 5036macetaminophen 3 hours later, then continue to alternate ibuprofen and acetaminophen. That way you are taking each type of medication every 6 hours. Take the prescribed narcotic (oxycodone, tramadol, etc) as needed, with a maximum being every 4 hours.  Antibiotics will be prescribed during the test period (after stage 1).   Reasons to Call the Nurse (see last page for phone numbers) Persistent nausea/vomiting Fever (100.4 degrees or more) Incision problems (pus, blood or other fluid coming out, redness, warmth, increased pain)  Things to Expect After Surgery Mild to Moderate pain is normal during the first day or two after surgery. If prescribed, take Ibuprofen or Tylenol first and use the stronger medicine for "break-through" pain. You can overlap these medicines because they work differently.   Constipation   To Prevent Constipation:  Eat a well-balanced diet including protein, grains,  fresh fruit and vegetables.  Drink plenty of fluids. Walk regularly.  Depending on specific instructions from your physician: take Miralax daily and additionally you can add a stool softener (colace/ docusate) and fiber supplement. Continue as long as you're on pain medications.   To Treat Constipation:  If you do not have a bowel movement in 2 days after surgery, you can take 2 Tbs of Milk of Magnesia 1-2 times a day until you have a bowel movement. If diarrhea occurs, decrease the amount or stop the laxative. If no results with Milk of Magnesia, you can drink a bottle of magnesium citrate which you can purchase over the counter.  Fatigue:  This is a normal response to surgery and will improve with time.  Plan frequent rest periods throughout the day.   Return to Work  As work demands and recovery times vary widely, it is hard to predict when you will want to return to work. If you have a desk job with no strenuous physical activity, and if you would like to return sooner than generally recommended, discuss this with your provider or call our office.   Post op concerns  For non-emergent issues, please call the Urogynecology Nurse. Please leave a message and someone will contact you within one business day.  You can also send a message through [facility]  AFTER HOURS (After 5:00 PM and on weekends):  For urgent matters that cannot wait until the next business day. Call our office [phone] connect to the doctor on call.  Please reserve this for important issues.   **FOR ANY TRUE EMERGENCY ISSUES CALL 911  OR GO TO THE NEAREST EMERGENCY ROOM.** Please inform our office or the doctor on call of any emergency.     APPOINTMENTS: Call [phone]    Post Anesthesia Home Care Instructions  Activity: Get plenty of rest for the remainder of the day. A responsible individual must stay with you for 24 hours following the procedure.  For the next 24 hours, DO NOT: -Drive a car -Operate  machinery -Drink alcoholic beverages -Take any medication unless instructed by your physician -Make any legal decisions or sign important papers.  Meals: Start with liquid foods such as gelatin or soup. Progress to regular foods as tolerated. Avoid greasy, spicy, heavy foods. If nausea and/or vomiting occur, drink only clear liquids until the nausea and/or vomiting subsides. Call your physician if vomiting continues.  Special Instructions/Symptoms: Your throat may feel dry or sore from the anesthesia or the breathing tube placed in your throat during surgery. If this causes discomfort, gargle with warm salt water. The discomfort should disappear within 24 hours.       

## 2022-05-19 NOTE — Op Note (Signed)
Operative Note  Preoperative Diagnosis: urgency incontinence and fecal incontinence  Postoperative Diagnosis: same  Procedures performed:  Stage 1 sacral neuromodulation (Interstim)- Incision for implant of neuroelectrode with fluoroscopic guidance.   Implants:  Implant Name Type Inv. Item Serial No. Manufacturer Lot No. LRB No. Used Action  LEAD INTERSTIM 4.32 28 L - [id number] Lead LEAD INTERSTIM 4.32 28 L [id number] MEDTRONIC NEUROMOD PAIN MGMT [id number] Left 1 Implanted    Attending Surgeon: Sherlene Shams, MD  Anesthesia: MAC  Findings: Bellow and flexion of toe with stimulation of S3 on the left.    Specimens: * No specimens in log *  Estimated blood loss: 25 mL  IV fluids: 600 mL  Urine output: not recorded  Complications: none  Procedure in Detail:  After informed consent was obtained, the patient was taken to the operating room where anesthesia was induced and found to be adequate. Patient was placed in the prone position with adequate padding to flatten the sacrum and under the shins to allow the toes to dangle freely. She was then prepped and draped in the usual sterile fashion. The C-arm was positioned in the AP and lateral positions to provide fluoroscopic mapping of the sacral region. Spinal needles in laid in parallel were used to identify the foramen landmarks.The S3 foramen was identified. Local injection of 0.25% bupivacaine was injected down each side to the level of the sacrum. The foramen needle was placed at a 60 degree angle into the S3 foramen on the left. Proper needle depth was confirmed with fluoroscopy. This was repeated on the right side.  Position was confirmed by direct observation of bellowing and plantar flexion of the toe utilizing the external test stimulator. Optimal response was noted on the left side, so the right side needle was removed.   The foramen needle stylet was removed and guidewire was placed. An incision was made with an 11-blade scalpel  and the introducer was placed over the guidewire. Proper depth was confirmed with fluoroscopy until the opaque mark of the dilator was seen on the anterior rim of the sacrum with fluoroscopy.  The obturator was unlocked and removed and the lead was placed through the introducer sheath to the first white line.  Position was checked fluoroscopically.  The lead was then introduced until three electrodes were visible below the sacrum.  Each electrode was tested for visualization of the bellows and plantar flexion of the great toe.  After satisfactory positioning was confirmed under fluoroscopy, the introducer sheath was retracted deploying the lead ties into the perisacral tissues under live fluoroscopy.    Attention was turned to the patient's right where anesthetic was injected at a point previously marked on the buttocks, below the iliac crest and below the belt line. The skin was injected with 0.25% bupivacaine and an incision was made with a 15 blade scalpel. Further dissection was made into the subcutaneous tissue, allowing for a sufficient pocket for the generator to later be placed.  A tunneling tool and tube were placed from the sacral lead subcutaneously to the incised pocket site.  The tunneling tool was removed and the lead was sent through the tube and pulled out at the pocket site. The tunneler was then used to make a second tunnel and brought out of the skin superolateral to the gluteal incision. The extension wire was placed in the tunneller from the external site and brought back into the gluteal incision. The lead was cleansed of bodily fluids and attached to the extension wire  and screwed in place. The connector with wire was secured by making a loose knot in it to prevent migration through the external tunneling site.  The wires and connector were buried in the pocket. Smaller skin incisions were closed with interrupted 2-0 vicryl. The pocket with closed with 2-0 vicryl subcutaneously and the skin  was closed with 2-0 silk in an interrupted fashion. Telfa and tegaderm were placed over the incisions. The patient tolerated the procedure well and was taken to the recovery room in stable condition. Needle and sponge count was correct x2.

## 2022-05-20 ENCOUNTER — Encounter: Payer: Self-pay | Admitting: *Deleted

## 2022-05-20 ENCOUNTER — Encounter (HOSPITAL_BASED_OUTPATIENT_CLINIC_OR_DEPARTMENT_OTHER): Payer: Self-pay | Admitting: Obstetrics and Gynecology

## 2022-05-20 NOTE — Telephone Encounter (Signed)
Post- Op Call  Rachel Gardner underwent Interstim Stage 1 on 05/19/22 with Dr Wannetta Sender. The patient reports that her pain is controlled. She is taking tylenol. She reports vaginal bleeding that is small. She was encouraged to take miralax for a bowel regimen. She was discharged without a catheter.  Advised to call with any problems or concerns. Blenda Nicely, RN

## 2022-05-22 ENCOUNTER — Encounter (HOSPITAL_BASED_OUTPATIENT_CLINIC_OR_DEPARTMENT_OTHER): Payer: Self-pay | Admitting: Obstetrics and Gynecology

## 2022-05-23 ENCOUNTER — Encounter (HOSPITAL_BASED_OUTPATIENT_CLINIC_OR_DEPARTMENT_OTHER): Payer: Self-pay | Admitting: Obstetrics and Gynecology

## 2022-05-27 ENCOUNTER — Encounter (HOSPITAL_BASED_OUTPATIENT_CLINIC_OR_DEPARTMENT_OTHER): Payer: Self-pay | Admitting: Obstetrics and Gynecology

## 2022-05-27 NOTE — Progress Notes (Signed)
Spoke w/ via phone for pre-op interview--- pt and pt's son, [name], due to pt hearing impaired on the phone Lab needs dos----   [employer] results------ current ekg in epic/ chart COVID test -----patient states asymptomatic no test needed Arrive at ------- 0530 on 06-02-2022 NPO after MN NO Solid Food.  Clear liquids from MN until--- 0430 Med rec completed Medications to take morning of surgery ----- spirivia inhaler, synthroid Diabetic medication ----- n/a Patient instructed no nail polish to be worn day of surgery Patient instructed to bring photo id and insurance card day of surgery Patient aware to have Driver (ride ) son, Fabio/ caregiver for 24 hours after surgery --- pt's daughter-n-law, paula Bosshart Patient Special Instructions ----- n/a Pre-Op special Istructions -----  pt speaks very good english, uses cochlear hearing aids to hear Patient verbalized understanding of instructions that were given at this phone interview. Patient denies shortness of breath, chest pain, fever, cough at this phone interview.

## 2022-05-29 ENCOUNTER — Encounter (HOSPITAL_BASED_OUTPATIENT_CLINIC_OR_DEPARTMENT_OTHER): Payer: Self-pay | Admitting: *Deleted

## 2022-06-01 MED ORDER — GENTAMICIN SULFATE 40 MG/ML IJ SOLN
5.0000 mg/kg | INTRAVENOUS | Status: AC
  Administered 2022-06-02: 260 mg via INTRAVENOUS
  Filled 2022-06-01: qty 6.5

## 2022-06-01 NOTE — Anesthesia Preprocedure Evaluation (Addendum)
Anesthesia Evaluation  Patient identified by MRN, date of birth, ID band Patient awake    Reviewed: Allergy & Precautions, NPO status , Patient's Chart, lab work & pertinent test results  Airway Mallampati: II  TM Distance: >3 FB Neck ROM: Full    Dental no notable dental hx. (+) Dental Advisory Given, Partial Lower, Partial Upper   Pulmonary sleep apnea and Continuous Positive Airway Pressure Ventilation , COPD, former smoker,    Pulmonary exam normal breath sounds clear to auscultation       Cardiovascular hypertension, Normal cardiovascular exam Rhythm:Regular Rate:Normal  11/2021 TTE 1. Left ventricular ejection fraction, by estimation, is >75%. The left  ventricle has hyperdynamic function. The left ventricle has no regional  wall motion abnormalities. Left ventricular diastolic parameters are  consistent with Grade I diastolic  dysfunction (impaired relaxation). The average left ventricular global  longitudinal strain is 21.9 %. The global longitudinal strain is normal.  2. Right ventricular systolic function is normal. The right ventricular  size is normal.  3. The mitral valve is normal in structure. No evidence of mitral valve  regurgitation. No evidence of mitral stenosis.  4. The aortic valve is normal in structure. Aortic valve regurgitation is  not visualized. No aortic stenosis is present.  5. Aortic dilatation noted. There is borderline dilatation of the aortic  root, measuring 38 mm. There is mild dilatation of the ascending aorta,  measuring 40 mm.  6. The inferior vena cava is normal in size with greater than 50%  respiratory variability, suggesting right atrial pressure of 3 mmHg.    Neuro/Psych    GI/Hepatic hiatal hernia, GERD  ,  Endo/Other  Hypothyroidism   Renal/GU Lab Results      Component                Value               Date                      CREATININE               1.30 (H)             06/02/2022              K                        3.7                 06/02/2022                      Musculoskeletal  (+) Arthritis ,   Abdominal   Peds  Hematology Lab Results      Component                Value               Date                            HGB                      12.6                06/02/2022                HCT  37.0                06/02/2022                 Anesthesia Other Findings All: Wellbutrin, statins  Reproductive/Obstetrics                            Anesthesia Physical Anesthesia Plan  ASA: 3  Anesthesia Plan: MAC   Post-op Pain Management:    Induction:   PONV Risk Score and Plan: 2 and Treatment may vary due to age or medical condition and Midazolam  Airway Management Planned: Natural Airway and Nasal Cannula  Additional Equipment: None  Intra-op Plan:   Post-operative Plan:   Informed Consent: I have reviewed the patients History and Physical, chart, labs and discussed the procedure including the risks, benefits and alternatives for the proposed anesthesia with the patient or authorized representative who has indicated his/her understanding and acceptance.     Dental advisory given  Plan Discussed with: CRNA  Anesthesia Plan Comments:        Anesthesia Quick Evaluation

## 2022-06-02 ENCOUNTER — Ambulatory Visit (HOSPITAL_BASED_OUTPATIENT_CLINIC_OR_DEPARTMENT_OTHER): Payer: Medicare Other | Admitting: Anesthesiology

## 2022-06-02 ENCOUNTER — Other Ambulatory Visit: Payer: Self-pay

## 2022-06-02 ENCOUNTER — Ambulatory Visit (HOSPITAL_BASED_OUTPATIENT_CLINIC_OR_DEPARTMENT_OTHER)
Admission: RE | Admit: 2022-06-02 | Discharge: 2022-06-02 | Disposition: A | Discharge status: Home / Self Care | Payer: Medicare Other | Attending: Obstetrics and Gynecology | Admitting: Obstetrics and Gynecology

## 2022-06-02 ENCOUNTER — Encounter (HOSPITAL_BASED_OUTPATIENT_CLINIC_OR_DEPARTMENT_OTHER): Payer: Self-pay | Admitting: Obstetrics and Gynecology

## 2022-06-02 ENCOUNTER — Telehealth: Payer: Self-pay | Admitting: Obstetrics and Gynecology

## 2022-06-02 ENCOUNTER — Encounter (HOSPITAL_BASED_OUTPATIENT_CLINIC_OR_DEPARTMENT_OTHER)
Admission: RE | Disposition: A | Discharge status: Home / Self Care | Payer: Self-pay | Source: Home / Self Care | Attending: Obstetrics and Gynecology

## 2022-06-02 DIAGNOSIS — R159 Full incontinence of feces: Secondary | ICD-10-CM | POA: Insufficient documentation

## 2022-06-02 DIAGNOSIS — G4730 Sleep apnea, unspecified: Secondary | ICD-10-CM | POA: Insufficient documentation

## 2022-06-02 DIAGNOSIS — I10 Essential (primary) hypertension: Secondary | ICD-10-CM

## 2022-06-02 DIAGNOSIS — R32 Unspecified urinary incontinence: Secondary | ICD-10-CM | POA: Insufficient documentation

## 2022-06-02 DIAGNOSIS — E039 Hypothyroidism, unspecified: Secondary | ICD-10-CM | POA: Insufficient documentation

## 2022-06-02 DIAGNOSIS — J449 Chronic obstructive pulmonary disease, unspecified: Secondary | ICD-10-CM | POA: Diagnosis not present

## 2022-06-02 DIAGNOSIS — Z87891 Personal history of nicotine dependence: Secondary | ICD-10-CM | POA: Diagnosis not present

## 2022-06-02 DIAGNOSIS — Z01818 Encounter for other preprocedural examination: Secondary | ICD-10-CM

## 2022-06-02 DIAGNOSIS — N3281 Overactive bladder: Secondary | ICD-10-CM | POA: Insufficient documentation

## 2022-06-02 HISTORY — PX: INTERSTIM IMPLANT PLACEMENT: SHX5130

## 2022-06-02 LAB — POCT I-STAT, CHEM 8
BUN: 34 mg/dL — ABNORMAL HIGH (ref 8–23)
Calcium, Ion: 1.22 mmol/L (ref 1.15–1.40)
Chloride: 107 mmol/L (ref 98–111)
Creatinine, Ser: 1.3 mg/dL — ABNORMAL HIGH (ref 0.44–1.00)
Glucose, Bld: 99 mg/dL (ref 70–99)
HCT: 37 % (ref 36.0–46.0)
Hemoglobin: 12.6 g/dL (ref 12.0–15.0)
Potassium: 3.7 mmol/L (ref 3.5–5.1)
Sodium: 137 mmol/L (ref 135–145)
TCO2: 22 mmol/L (ref 22–32)

## 2022-06-02 SURGERY — INSERTION, SACRAL NERVE STIMULATOR, INTERSTIM, STAGE 2
Anesthesia: Monitor Anesthesia Care | Site: Back

## 2022-06-02 MED ORDER — PROPOFOL 10 MG/ML IV BOLUS
INTRAVENOUS | Status: DC | PRN
  Administered 2022-06-02: 20 mg via INTRAVENOUS

## 2022-06-02 MED ORDER — EPHEDRINE SULFATE (PRESSORS) 50 MG/ML IJ SOLN
INTRAMUSCULAR | Status: DC | PRN
  Administered 2022-06-02 (×2): 5 mg via INTRAVENOUS

## 2022-06-02 MED ORDER — FENTANYL CITRATE (PF) 250 MCG/5ML IJ SOLN
INTRAMUSCULAR | Status: DC | PRN
  Administered 2022-06-02: 50 ug via INTRAVENOUS

## 2022-06-02 MED ORDER — FENTANYL CITRATE (PF) 100 MCG/2ML IJ SOLN
INTRAMUSCULAR | Status: AC
  Filled 2022-06-02: qty 2

## 2022-06-02 MED ORDER — BUPIVACAINE HCL 0.25 % IJ SOLN
INTRAMUSCULAR | Status: DC | PRN
  Administered 2022-06-02: 16 mL

## 2022-06-02 MED ORDER — LACTATED RINGERS IV SOLN: INTRAVENOUS | Status: DC

## 2022-06-02 MED ORDER — WATER FOR IRRIGATION, STERILE IR SOLN
Status: DC | PRN
  Administered 2022-06-02: 500 mL

## 2022-06-02 MED ORDER — CEFAZOLIN SODIUM-DEXTROSE 2-4 GM/100ML-% IV SOLN
INTRAVENOUS | Status: AC
  Filled 2022-06-02: qty 100

## 2022-06-02 MED ORDER — FENTANYL CITRATE (PF) 100 MCG/2ML IJ SOLN: 25.0000 ug | INTRAMUSCULAR | Status: DC | PRN

## 2022-06-02 MED ORDER — ONDANSETRON HCL 4 MG/2ML IJ SOLN: 4.0000 mg | Freq: Once | INTRAMUSCULAR | Status: DC | PRN

## 2022-06-02 MED ORDER — ACETAMINOPHEN 10 MG/ML IV SOLN: 1000.0000 mg | Freq: Once | INTRAVENOUS | Status: DC | PRN

## 2022-06-02 MED ORDER — PROPOFOL 500 MG/50ML IV EMUL
INTRAVENOUS | Status: DC | PRN
  Administered 2022-06-02: 100 ug/kg/min via INTRAVENOUS

## 2022-06-02 MED ORDER — CEFAZOLIN SODIUM-DEXTROSE 2-4 GM/100ML-% IV SOLN
2.0000 g | INTRAVENOUS | Status: AC
  Administered 2022-06-02: 2 g via INTRAVENOUS

## 2022-06-02 MED ORDER — PHENYLEPHRINE 80 MCG/ML (10ML) SYRINGE FOR IV PUSH (FOR BLOOD PRESSURE SUPPORT)
PREFILLED_SYRINGE | INTRAVENOUS | Status: DC | PRN
  Administered 2022-06-02 (×5): 80 ug via INTRAVENOUS
  Administered 2022-06-02: 160 ug via INTRAVENOUS

## 2022-06-02 MED ORDER — PROPOFOL 10 MG/ML IV BOLUS
INTRAVENOUS | Status: AC
  Filled 2022-06-02: qty 20

## 2022-06-02 MED ORDER — ONDANSETRON HCL 4 MG/2ML IJ SOLN
INTRAMUSCULAR | Status: DC | PRN
  Administered 2022-06-02: 4 mg via INTRAVENOUS

## 2022-06-02 MED ORDER — LIDOCAINE 2% (20 MG/ML) 5 ML SYRINGE
INTRAMUSCULAR | Status: DC | PRN
  Administered 2022-06-02: 40 mg via INTRAVENOUS

## 2022-06-02 SURGICAL SUPPLY — 46 items
APL PRP STRL LF DISP 70% ISPRP (MISCELLANEOUS) ×2
BLADE SURG 11 STRL SS (BLADE) ×2 IMPLANT
BLADE SURG 15 STRL LF DISP TIS (BLADE) ×1 IMPLANT
BLADE SURG 15 STRL SS (BLADE) ×2
CHLORAPREP W/TINT 26 (MISCELLANEOUS) ×3 IMPLANT
COVER BACK TABLE 60X90IN (DRAPES) ×2 IMPLANT
COVER MAYO STAND STRL (DRAPES) ×2 IMPLANT
COVER PROBE U/S 5X48 (MISCELLANEOUS) ×1 IMPLANT
DRAPE 3/4 80X56 (DRAPES) ×2 IMPLANT
DRAPE LAPAROSCOPIC ABDOMINAL (DRAPES) ×2 IMPLANT
DRAPE UTILITY 15X26 TOWEL STRL (DRAPES) ×2 IMPLANT
DRSG TEGADERM 4X4.75 (GAUZE/BANDAGES/DRESSINGS) ×2 IMPLANT
DRSG TEGADERM 8X12 (GAUZE/BANDAGES/DRESSINGS) ×2 IMPLANT
DRSG TELFA 3X8 NADH (GAUZE/BANDAGES/DRESSINGS) ×2 IMPLANT
ELECT REM PT RETURN 9FT ADLT (ELECTROSURGICAL) ×2
ELECTRODE REM PT RTRN 9FT ADLT (ELECTROSURGICAL) ×1 IMPLANT
GAUZE 4X4 16PLY ~~LOC~~+RFID DBL (SPONGE) ×2 IMPLANT
GAUZE SPONGE 4X4 12PLY STRL (GAUZE/BANDAGES/DRESSINGS) ×1 IMPLANT
GLOVE BIO SURGEON STRL SZ 6 (GLOVE) ×2 IMPLANT
GLOVE BIO SURGEON STRL SZ 6.5 (GLOVE) ×1 IMPLANT
GLOVE BIOGEL PI IND STRL 6.5 (GLOVE) ×1 IMPLANT
GLOVE BIOGEL PI IND STRL 7.0 (GLOVE) IMPLANT
GLOVE BIOGEL PI INDICATOR 6.5 (GLOVE) ×1
GLOVE BIOGEL PI INDICATOR 7.0 (GLOVE) ×3
GOWN STRL REUS W/ TWL LRG LVL3 (GOWN DISPOSABLE) IMPLANT
GOWN STRL REUS W/TWL LRG LVL3 (GOWN DISPOSABLE) ×4 IMPLANT
KIT HANDSET INTERSTIM COMM (NEUROSURGERY SUPPLIES) ×2 IMPLANT
KIT TURNOVER CYSTO (KITS) ×2 IMPLANT
LEAD INTERSTIM 4.32 28 L (Lead) ×1 IMPLANT
MRI Lead Kit ×1 IMPLANT
NEEDLE HYPO 22GX1.5 SAFETY (NEEDLE) ×2 IMPLANT
PACK BASIN DAY SURGERY FS (CUSTOM PROCEDURE TRAY) ×2 IMPLANT
PAD DRESSING TELFA 3X8 NADH (GAUZE/BANDAGES/DRESSINGS) ×1 IMPLANT
PENCIL SMOKE EVACUATOR (MISCELLANEOUS) ×2 IMPLANT
SPIKE FLUID TRANSFER (MISCELLANEOUS) ×1 IMPLANT
STIMULATOR INTERSTIM 2X1.7X.3 (Miscellaneous) ×2 IMPLANT
SUT MNCRL AB 4-0 PS2 18 (SUTURE) ×1 IMPLANT
SUT SILK 2 0 SH (SUTURE) ×2 IMPLANT
SUT VIC AB 2-0 SH 27 (SUTURE) ×4
SUT VIC AB 2-0 SH 27XBRD (SUTURE) ×1 IMPLANT
SYR BULB IRRIG 60ML STRL (SYRINGE) ×2 IMPLANT
SYR CONTROL 10ML LL (SYRINGE) ×2 IMPLANT
TOWEL OR 17X26 10 PK STRL BLUE (TOWEL DISPOSABLE) ×2 IMPLANT
TUBE CONNECTING 12X1/4 (SUCTIONS) ×2 IMPLANT
WATER STERILE IRR 500ML POUR (IV SOLUTION) ×2 IMPLANT
YANKAUER SUCT BULB TIP NO VENT (SUCTIONS) ×2 IMPLANT

## 2022-06-02 NOTE — Transfer of Care (Signed)
Immediate Anesthesia Transfer of Care Note  Patient: Rachel Gardner  Procedure(s) Performed: Barrie Lyme IMPLANT SECOND STAGE (Back)  Patient Location: PACU  Anesthesia Type:General  Level of Consciousness: awake, alert  and oriented  Airway & Oxygen Therapy: Patient Spontanous Breathing  Post-op Assessment: Report given to RN and Post -op Vital signs reviewed and stable  Post vital signs: Reviewed and stable  Last Vitals:  Vitals Value Taken Time  BP 114/81 06/02/22 0845  Temp    Pulse 84 06/02/22 0841  Resp 19 06/02/22 0847  SpO2 93 % 06/02/22 0841  Vitals shown include unvalidated device data.  Last Pain:  Vitals:   06/02/22 0614  TempSrc: Oral  PainSc: 0-No pain      Patients Stated Pain Goal: 3 (51/02/58 5277)  Complications: No notable events documented.

## 2022-06-02 NOTE — Discharge Instructions (Addendum)
POST OPERATIVE SACRAL NEUROMODULATION INSTRUCTIONS  General Instructions Recovery (not bed rest) will last approximately 1 weeks Walking is encouraged, but refrain from strenuous exercise/ housework/ heavy lifting. Bathing:  Do not submerge in water (NO swimming, bath, hot tub, etc) until after your postop visit.  Stage 1 You can sponge bath around the incision or shower only on the front without wetting the back.  Do not remove bandages. Ensure they are well taped and secured on the back. If bandages start to come off, please call the office Stage 2 Wait 48 hours to remove bandages You may shower after bandages are removed. You can wash with soap and water but do not scrub the incision.  No driving until you are not taking narcotic pain medicine and until your pain is well enough controlled that you can slam on the breaks or make sudden movements if needed.   Taking your medications Please take your acetaminophen and ibuprofen on a schedule for the first 48 hours. Take 629m ibuprofen, then take 5036macetaminophen 3 hours later, then continue to alternate ibuprofen and acetaminophen. That way you are taking each type of medication every 6 hours. Take the prescribed narcotic (oxycodone, tramadol, etc) as needed, with a maximum being every 4 hours.  Antibiotics will be prescribed during the test period (after stage 1).   Reasons to Call the Nurse (see last page for phone numbers) Persistent nausea/vomiting Fever (100.4 degrees or more) Incision problems (pus, blood or other fluid coming out, redness, warmth, increased pain)  Things to Expect After Surgery Mild to Moderate pain is normal during the first day or two after surgery. If prescribed, take Ibuprofen or Tylenol first and use the stronger medicine for "break-through" pain. You can overlap these medicines because they work differently.   Constipation   To Prevent Constipation:  Eat a well-balanced diet including protein, grains,  fresh fruit and vegetables.  Drink plenty of fluids. Walk regularly.  Depending on specific instructions from your physician: take Miralax daily and additionally you can add a stool softener (colace/ docusate) and fiber supplement. Continue as long as you're on pain medications.   To Treat Constipation:  If you do not have a bowel movement in 2 days after surgery, you can take 2 Tbs of Milk of Magnesia 1-2 times a day until you have a bowel movement. If diarrhea occurs, decrease the amount or stop the laxative. If no results with Milk of Magnesia, you can drink a bottle of magnesium citrate which you can purchase over the counter.  Fatigue:  This is a normal response to surgery and will improve with time.  Plan frequent rest periods throughout the day.   Return to Work  As work demands and recovery times vary widely, it is hard to predict when you will want to return to work. If you have a desk job with no strenuous physical activity, and if you would like to return sooner than generally recommended, discuss this with your provider or call our office.   Post op concerns  For non-emergent issues, please call the Urogynecology Nurse. Please leave a message and someone will contact you within one business day.  You can also send a message through [facility]  AFTER HOURS (After 5:00 PM and on weekends):  For urgent matters that cannot wait until the next business day. Call our office [phone] connect to the doctor on call.  Please reserve this for important issues.   **FOR ANY TRUE EMERGENCY ISSUES CALL 911  OR GO TO THE NEAREST EMERGENCY ROOM.** Please inform our office or the doctor on call of any emergency.     APPOINTMENTS: Call [phone]    Post Anesthesia Home Care Instructions  Activity: Get plenty of rest for the remainder of the day. A responsible individual must stay with you for 24 hours following the procedure.  For the next 24 hours, DO NOT: -Drive a car -Operate  machinery -Drink alcoholic beverages -Take any medication unless instructed by your physician -Make any legal decisions or sign important papers.  Meals: Start with liquid foods such as gelatin or soup. Progress to regular foods as tolerated. Avoid greasy, spicy, heavy foods. If nausea and/or vomiting occur, drink only clear liquids until the nausea and/or vomiting subsides. Call your physician if vomiting continues.  Special Instructions/Symptoms: Your throat may feel dry or sore from the anesthesia or the breathing tube placed in your throat during surgery. If this causes discomfort, gargle with warm salt water. The discomfort should disappear within 24 hours.       

## 2022-06-02 NOTE — Telephone Encounter (Signed)
Rachel Gardner underwent Stage II sacral neuromodulation on 06/02/22. She did not require a voiding trial.   She was discharged without a catheter. Please call her for a routine post op check. Thanks!  [name], MD

## 2022-06-02 NOTE — Anesthesia Postprocedure Evaluation (Signed)
Anesthesia Post Note  Patient: Rachel Gardner  Procedure(s) Performed: Barrie Lyme IMPLANT SECOND STAGE (Back)     Patient location during evaluation: PACU Anesthesia Type: MAC Level of consciousness: awake and alert Pain management: pain level controlled Vital Signs Assessment: post-procedure vital signs reviewed and stable Respiratory status: spontaneous breathing, nonlabored ventilation, respiratory function stable and patient connected to nasal cannula oxygen Cardiovascular status: stable and blood pressure returned to baseline Postop Assessment: no apparent nausea or vomiting Anesthetic complications: no   No notable events documented.  Last Vitals:  Vitals:   06/02/22 0915 06/02/22 0951  BP: 112/67 117/74  Pulse: 74 69  Resp: 13 17  Temp: (!) 36.4 C   SpO2: 97% 100%    Last Pain:  Vitals:   06/02/22 0915  TempSrc:   PainSc: 0-No pain                 Barnet Glasgow

## 2022-06-02 NOTE — Op Note (Signed)
Operative Note  Preoperative Diagnosis: overactive bladder and fecal incontinence  Postoperative Diagnosis: same  Procedures performed:  Stage II Sacral neuromodulation- placement of neurostimulator pulse generator (left)  Implants:  Implant Name Type Inv. Item Serial No. Manufacturer Lot No. LRB No. Used Action  STIMULATOR INTERSTIM 2X1.7X.3 - [id number] H Miscellaneous STIMULATOR INTERSTIM 2X1.7X.3 [id number] H [employer]ONIC NEUROMOD PAIN MGMT [id number] N/A 1 Implanted    Attending Surgeon: Sherlene Shams, MD  Anesthesia: MAC  Findings: Lead in S3 on the left. Battery pocket was changed from the right buttock the the left buttock.   Specimens: none  Estimated blood loss: 5 mL  IV fluids: 600 mL  Urine output: not recorded  Complications: none  Indications: 76yo F with urgency urinary and fecal incontinence. She underwent stage 1 sacral neuromodulation and had great improvement of her symptoms and is no longer wearing pads. She elected to undergo full device implant. She complained of the battery incision on the right causing discomfort due to her sleeping incision, so decision was made to change the battery pocket to the left side.   Procedure in Detail:  The patient was taken to the operating room where anesthesia was induced and found to be adequate. She was placed in the prone position with pressure points padded and prepped and draped in the usual sterile fashion.  0.25% marcaine local anesthetic was injected over the site of her prior right sided buttock incision. Nylon sutures were removed then the incision was opened with a #15 blade scalpel down to the subcutaneous layer.  The lead wires and boot were identified. The sutures holding the boot in place were cut and the boot was removed.  The distal end of the extension wire was cut and this wire was removed through the lateral skin incision. The left side incision from lead placement was injected with marcaine and incised with  a scalpel. A hemostat was used to identify the wire, and gently pulled out through the pocket. A new pocket incision was marked on the left buttock and injected with marcaine. The incision was incised with a 15 blade scalpel. Bovie was used to create a pocket in the subcutaneous tissue. The pocket with irrigated with water and hemostasis was noted. A tunneler was used to tunnel the wire to the left side pocket.  The lead wire was then plugged into the generator and also screwed in x 1. The generator was placed in the pocket. It was noted to have adequate room but also stayed in place. No impedences were noted. All incisions were closed- the subcutaneous layer was closed with running 2-0 Vicryl. The skin was closed with a subcuticular stitch of 4-0 monocryl. Dressings were placed.  Sponge, lap and needle counts were correct x 2.  The patient tolerated the procedure well.  Programming was performed in the recovery room.   [name], MD

## 2022-06-02 NOTE — Interval H&P Note (Signed)
History and Physical Interval Note:  06/02/2022 7:10 AM  Rachel Gardner Ao  has presented today for surgery, with the diagnosis of urge incontinence; fecal incontinence.  The various methods of treatment have been discussed with the patient and family. After consideration of risks, benefits and other options for treatment, the patient has consented to  Procedure(s): INTERSTIM IMPLANT SECOND STAGE (N/A) as a surgical intervention.  The patient's history has been reviewed, patient examined, no change in status, stable for surgery.  I have reviewed the patient's chart and labs.  Questions were answered to the patient's satisfaction.     Rachel Gardner

## 2022-06-03 ENCOUNTER — Encounter (HOSPITAL_BASED_OUTPATIENT_CLINIC_OR_DEPARTMENT_OTHER): Payer: Self-pay | Admitting: Obstetrics and Gynecology

## 2022-06-04 ENCOUNTER — Encounter: Payer: Self-pay | Admitting: *Deleted

## 2022-06-04 NOTE — Telephone Encounter (Signed)
Pt sent message in mychart updated [name] s/p surgery. Message forwarded to Dr. Wannetta Sender.

## 2022-06-12 ENCOUNTER — Encounter: Payer: Self-pay | Admitting: Obstetrics and Gynecology

## 2022-06-12 DIAGNOSIS — R21 Rash and other nonspecific skin eruption: Secondary | ICD-10-CM | POA: Diagnosis not present

## 2022-06-12 DIAGNOSIS — E890 Postprocedural hypothyroidism: Secondary | ICD-10-CM | POA: Diagnosis not present

## 2022-06-24 ENCOUNTER — Encounter: Payer: Self-pay | Admitting: Obstetrics and Gynecology

## 2022-06-24 ENCOUNTER — Ambulatory Visit (INDEPENDENT_AMBULATORY_CARE_PROVIDER_SITE_OTHER): Payer: Medicare Other | Admitting: Obstetrics and Gynecology

## 2022-06-24 VITALS — BP 109/78 | HR 87

## 2022-06-24 DIAGNOSIS — R159 Full incontinence of feces: Secondary | ICD-10-CM

## 2022-06-24 DIAGNOSIS — N3281 Overactive bladder: Secondary | ICD-10-CM

## 2022-07-09 DIAGNOSIS — M48062 Spinal stenosis, lumbar region with neurogenic claudication: Secondary | ICD-10-CM | POA: Diagnosis not present

## 2022-08-01 DIAGNOSIS — M48062 Spinal stenosis, lumbar region with neurogenic claudication: Secondary | ICD-10-CM | POA: Diagnosis not present

## 2022-08-01 DIAGNOSIS — Z6825 Body mass index (BMI) 25.0-25.9, adult: Secondary | ICD-10-CM | POA: Diagnosis not present

## 2022-08-11 DIAGNOSIS — H401131 Primary open-angle glaucoma, bilateral, mild stage: Secondary | ICD-10-CM | POA: Diagnosis not present

## 2022-08-11 DIAGNOSIS — Z961 Presence of intraocular lens: Secondary | ICD-10-CM | POA: Diagnosis not present

## 2022-08-20 DIAGNOSIS — R519 Headache, unspecified: Secondary | ICD-10-CM | POA: Diagnosis not present

## 2022-08-20 DIAGNOSIS — M545 Low back pain, unspecified: Secondary | ICD-10-CM | POA: Diagnosis not present

## 2022-08-20 DIAGNOSIS — I10 Essential (primary) hypertension: Secondary | ICD-10-CM | POA: Diagnosis not present

## 2022-09-16 DIAGNOSIS — M549 Dorsalgia, unspecified: Secondary | ICD-10-CM | POA: Diagnosis not present

## 2022-09-18 ENCOUNTER — Encounter: Payer: Self-pay | Admitting: Obstetrics and Gynecology

## 2022-09-18 DIAGNOSIS — M25651 Stiffness of right hip, not elsewhere classified: Secondary | ICD-10-CM | POA: Diagnosis not present

## 2022-09-18 DIAGNOSIS — M25652 Stiffness of left hip, not elsewhere classified: Secondary | ICD-10-CM | POA: Diagnosis not present

## 2022-09-18 DIAGNOSIS — M4807 Spinal stenosis, lumbosacral region: Secondary | ICD-10-CM | POA: Diagnosis not present

## 2022-09-18 DIAGNOSIS — R262 Difficulty in walking, not elsewhere classified: Secondary | ICD-10-CM | POA: Diagnosis not present

## 2022-09-20 DIAGNOSIS — R109 Unspecified abdominal pain: Secondary | ICD-10-CM | POA: Diagnosis not present

## 2022-09-20 DIAGNOSIS — R319 Hematuria, unspecified: Secondary | ICD-10-CM | POA: Diagnosis not present

## 2022-09-23 DIAGNOSIS — M25651 Stiffness of right hip, not elsewhere classified: Secondary | ICD-10-CM | POA: Diagnosis not present

## 2022-09-23 DIAGNOSIS — M25652 Stiffness of left hip, not elsewhere classified: Secondary | ICD-10-CM | POA: Diagnosis not present

## 2022-09-23 DIAGNOSIS — R262 Difficulty in walking, not elsewhere classified: Secondary | ICD-10-CM | POA: Diagnosis not present

## 2022-09-23 DIAGNOSIS — M4807 Spinal stenosis, lumbosacral region: Secondary | ICD-10-CM | POA: Diagnosis not present

## 2022-09-25 DIAGNOSIS — M4807 Spinal stenosis, lumbosacral region: Secondary | ICD-10-CM | POA: Diagnosis not present

## 2022-09-25 DIAGNOSIS — R262 Difficulty in walking, not elsewhere classified: Secondary | ICD-10-CM | POA: Diagnosis not present

## 2022-09-25 DIAGNOSIS — M25652 Stiffness of left hip, not elsewhere classified: Secondary | ICD-10-CM | POA: Diagnosis not present

## 2022-09-25 DIAGNOSIS — M25651 Stiffness of right hip, not elsewhere classified: Secondary | ICD-10-CM | POA: Diagnosis not present

## 2022-09-30 DIAGNOSIS — M4807 Spinal stenosis, lumbosacral region: Secondary | ICD-10-CM | POA: Diagnosis not present

## 2022-09-30 DIAGNOSIS — R262 Difficulty in walking, not elsewhere classified: Secondary | ICD-10-CM | POA: Diagnosis not present

## 2022-09-30 DIAGNOSIS — M25651 Stiffness of right hip, not elsewhere classified: Secondary | ICD-10-CM | POA: Diagnosis not present

## 2022-09-30 DIAGNOSIS — M25652 Stiffness of left hip, not elsewhere classified: Secondary | ICD-10-CM | POA: Diagnosis not present

## 2022-10-02 DIAGNOSIS — M25651 Stiffness of right hip, not elsewhere classified: Secondary | ICD-10-CM | POA: Diagnosis not present

## 2022-10-02 DIAGNOSIS — M4807 Spinal stenosis, lumbosacral region: Secondary | ICD-10-CM | POA: Diagnosis not present

## 2022-10-02 DIAGNOSIS — M25652 Stiffness of left hip, not elsewhere classified: Secondary | ICD-10-CM | POA: Diagnosis not present

## 2022-10-02 DIAGNOSIS — R262 Difficulty in walking, not elsewhere classified: Secondary | ICD-10-CM | POA: Diagnosis not present

## 2022-10-06 ENCOUNTER — Encounter (HOSPITAL_COMMUNITY): Payer: Self-pay

## 2022-10-06 DIAGNOSIS — Z87442 Personal history of urinary calculi: Secondary | ICD-10-CM | POA: Diagnosis not present

## 2022-10-06 DIAGNOSIS — N281 Cyst of kidney, acquired: Secondary | ICD-10-CM | POA: Diagnosis not present

## 2022-10-07 DIAGNOSIS — M25652 Stiffness of left hip, not elsewhere classified: Secondary | ICD-10-CM | POA: Diagnosis not present

## 2022-10-07 DIAGNOSIS — Z45321 Encounter for adjustment and management of cochlear device: Secondary | ICD-10-CM | POA: Diagnosis not present

## 2022-10-07 DIAGNOSIS — M25651 Stiffness of right hip, not elsewhere classified: Secondary | ICD-10-CM | POA: Diagnosis not present

## 2022-10-07 DIAGNOSIS — H903 Sensorineural hearing loss, bilateral: Secondary | ICD-10-CM | POA: Diagnosis not present

## 2022-10-07 DIAGNOSIS — M4807 Spinal stenosis, lumbosacral region: Secondary | ICD-10-CM | POA: Diagnosis not present

## 2022-10-07 DIAGNOSIS — Z9621 Cochlear implant status: Secondary | ICD-10-CM | POA: Diagnosis not present

## 2022-10-07 DIAGNOSIS — R262 Difficulty in walking, not elsewhere classified: Secondary | ICD-10-CM | POA: Diagnosis not present

## 2022-10-09 DIAGNOSIS — R262 Difficulty in walking, not elsewhere classified: Secondary | ICD-10-CM | POA: Diagnosis not present

## 2022-10-09 DIAGNOSIS — M25652 Stiffness of left hip, not elsewhere classified: Secondary | ICD-10-CM | POA: Diagnosis not present

## 2022-10-09 DIAGNOSIS — M4807 Spinal stenosis, lumbosacral region: Secondary | ICD-10-CM | POA: Diagnosis not present

## 2022-10-09 DIAGNOSIS — M25651 Stiffness of right hip, not elsewhere classified: Secondary | ICD-10-CM | POA: Diagnosis not present

## 2022-10-14 DIAGNOSIS — M25652 Stiffness of left hip, not elsewhere classified: Secondary | ICD-10-CM | POA: Diagnosis not present

## 2022-10-14 DIAGNOSIS — R262 Difficulty in walking, not elsewhere classified: Secondary | ICD-10-CM | POA: Diagnosis not present

## 2022-10-14 DIAGNOSIS — M25651 Stiffness of right hip, not elsewhere classified: Secondary | ICD-10-CM | POA: Diagnosis not present

## 2022-10-14 DIAGNOSIS — M4807 Spinal stenosis, lumbosacral region: Secondary | ICD-10-CM | POA: Diagnosis not present

## 2022-10-16 DIAGNOSIS — M25651 Stiffness of right hip, not elsewhere classified: Secondary | ICD-10-CM | POA: Diagnosis not present

## 2022-10-16 DIAGNOSIS — M25652 Stiffness of left hip, not elsewhere classified: Secondary | ICD-10-CM | POA: Diagnosis not present

## 2022-10-16 DIAGNOSIS — R262 Difficulty in walking, not elsewhere classified: Secondary | ICD-10-CM | POA: Diagnosis not present

## 2022-10-16 DIAGNOSIS — M4807 Spinal stenosis, lumbosacral region: Secondary | ICD-10-CM | POA: Diagnosis not present

## 2022-10-21 DIAGNOSIS — M25652 Stiffness of left hip, not elsewhere classified: Secondary | ICD-10-CM | POA: Diagnosis not present

## 2022-10-21 DIAGNOSIS — M4807 Spinal stenosis, lumbosacral region: Secondary | ICD-10-CM | POA: Diagnosis not present

## 2022-10-21 DIAGNOSIS — R262 Difficulty in walking, not elsewhere classified: Secondary | ICD-10-CM | POA: Diagnosis not present

## 2022-10-21 DIAGNOSIS — M25651 Stiffness of right hip, not elsewhere classified: Secondary | ICD-10-CM | POA: Diagnosis not present

## 2022-10-21 DIAGNOSIS — Z45321 Encounter for adjustment and management of cochlear device: Secondary | ICD-10-CM | POA: Diagnosis not present

## 2022-10-21 DIAGNOSIS — H903 Sensorineural hearing loss, bilateral: Secondary | ICD-10-CM | POA: Diagnosis not present

## 2022-10-21 DIAGNOSIS — Z9621 Cochlear implant status: Secondary | ICD-10-CM | POA: Diagnosis not present

## 2022-10-23 DIAGNOSIS — R262 Difficulty in walking, not elsewhere classified: Secondary | ICD-10-CM | POA: Diagnosis not present

## 2022-10-23 DIAGNOSIS — M4807 Spinal stenosis, lumbosacral region: Secondary | ICD-10-CM | POA: Diagnosis not present

## 2022-10-23 DIAGNOSIS — M25652 Stiffness of left hip, not elsewhere classified: Secondary | ICD-10-CM | POA: Diagnosis not present

## 2022-10-23 DIAGNOSIS — M25651 Stiffness of right hip, not elsewhere classified: Secondary | ICD-10-CM | POA: Diagnosis not present

## 2022-10-28 DIAGNOSIS — M25651 Stiffness of right hip, not elsewhere classified: Secondary | ICD-10-CM | POA: Diagnosis not present

## 2022-10-28 DIAGNOSIS — R262 Difficulty in walking, not elsewhere classified: Secondary | ICD-10-CM | POA: Diagnosis not present

## 2022-10-28 DIAGNOSIS — M4807 Spinal stenosis, lumbosacral region: Secondary | ICD-10-CM | POA: Diagnosis not present

## 2022-10-28 DIAGNOSIS — M25652 Stiffness of left hip, not elsewhere classified: Secondary | ICD-10-CM | POA: Diagnosis not present

## 2022-10-30 DIAGNOSIS — M4807 Spinal stenosis, lumbosacral region: Secondary | ICD-10-CM | POA: Diagnosis not present

## 2022-10-30 DIAGNOSIS — R262 Difficulty in walking, not elsewhere classified: Secondary | ICD-10-CM | POA: Diagnosis not present

## 2022-10-30 DIAGNOSIS — M25651 Stiffness of right hip, not elsewhere classified: Secondary | ICD-10-CM | POA: Diagnosis not present

## 2022-10-30 DIAGNOSIS — M25652 Stiffness of left hip, not elsewhere classified: Secondary | ICD-10-CM | POA: Diagnosis not present

## 2022-10-31 DIAGNOSIS — J040 Acute laryngitis: Secondary | ICD-10-CM | POA: Diagnosis not present

## 2022-10-31 DIAGNOSIS — R221 Localized swelling, mass and lump, neck: Secondary | ICD-10-CM | POA: Diagnosis not present

## 2022-10-31 DIAGNOSIS — Z1152 Encounter for screening for COVID-19: Secondary | ICD-10-CM | POA: Diagnosis not present

## 2022-11-03 DIAGNOSIS — M4807 Spinal stenosis, lumbosacral region: Secondary | ICD-10-CM | POA: Diagnosis not present

## 2022-11-03 DIAGNOSIS — M25652 Stiffness of left hip, not elsewhere classified: Secondary | ICD-10-CM | POA: Diagnosis not present

## 2022-11-03 DIAGNOSIS — M25651 Stiffness of right hip, not elsewhere classified: Secondary | ICD-10-CM | POA: Diagnosis not present

## 2022-11-03 DIAGNOSIS — R262 Difficulty in walking, not elsewhere classified: Secondary | ICD-10-CM | POA: Diagnosis not present

## 2022-11-05 DIAGNOSIS — Z9621 Cochlear implant status: Secondary | ICD-10-CM | POA: Diagnosis not present

## 2022-11-05 DIAGNOSIS — R490 Dysphonia: Secondary | ICD-10-CM | POA: Diagnosis not present

## 2022-11-12 ENCOUNTER — Ambulatory Visit (HOSPITAL_COMMUNITY): Payer: Medicare Other | Attending: Cardiology

## 2022-11-12 DIAGNOSIS — I77810 Thoracic aortic ectasia: Secondary | ICD-10-CM

## 2022-11-12 DIAGNOSIS — I10 Essential (primary) hypertension: Secondary | ICD-10-CM | POA: Diagnosis not present

## 2022-11-12 LAB — ECHOCARDIOGRAM COMPLETE
Area-P 1/2: 4.01 cm2
S' Lateral: 2.5 cm

## 2022-11-13 ENCOUNTER — Telehealth: Payer: Self-pay | Admitting: *Deleted

## 2022-11-13 DIAGNOSIS — I77810 Thoracic aortic ectasia: Secondary | ICD-10-CM

## 2022-11-13 NOTE — Telephone Encounter (Signed)
11/12/2022  4:20 PM EST     Stable ascending aorta. 41 mm Repeat echo in one year to monitor Candee Furbish, MD

## 2022-11-17 DIAGNOSIS — J040 Acute laryngitis: Secondary | ICD-10-CM | POA: Diagnosis not present

## 2022-11-17 DIAGNOSIS — M4807 Spinal stenosis, lumbosacral region: Secondary | ICD-10-CM | POA: Diagnosis not present

## 2022-11-17 DIAGNOSIS — M25651 Stiffness of right hip, not elsewhere classified: Secondary | ICD-10-CM | POA: Diagnosis not present

## 2022-11-17 DIAGNOSIS — M25652 Stiffness of left hip, not elsewhere classified: Secondary | ICD-10-CM | POA: Diagnosis not present

## 2022-11-17 DIAGNOSIS — R262 Difficulty in walking, not elsewhere classified: Secondary | ICD-10-CM | POA: Diagnosis not present

## 2022-11-18 ENCOUNTER — Other Ambulatory Visit (HOSPITAL_COMMUNITY): Payer: Medicare Other

## 2022-11-18 DIAGNOSIS — H903 Sensorineural hearing loss, bilateral: Secondary | ICD-10-CM | POA: Diagnosis not present

## 2022-11-19 DIAGNOSIS — M4807 Spinal stenosis, lumbosacral region: Secondary | ICD-10-CM | POA: Diagnosis not present

## 2022-11-19 DIAGNOSIS — M25652 Stiffness of left hip, not elsewhere classified: Secondary | ICD-10-CM | POA: Diagnosis not present

## 2022-11-19 DIAGNOSIS — M25651 Stiffness of right hip, not elsewhere classified: Secondary | ICD-10-CM | POA: Diagnosis not present

## 2022-11-19 DIAGNOSIS — R262 Difficulty in walking, not elsewhere classified: Secondary | ICD-10-CM | POA: Diagnosis not present

## 2022-11-24 DIAGNOSIS — M25651 Stiffness of right hip, not elsewhere classified: Secondary | ICD-10-CM | POA: Diagnosis not present

## 2022-11-24 DIAGNOSIS — M25652 Stiffness of left hip, not elsewhere classified: Secondary | ICD-10-CM | POA: Diagnosis not present

## 2022-11-24 DIAGNOSIS — R262 Difficulty in walking, not elsewhere classified: Secondary | ICD-10-CM | POA: Diagnosis not present

## 2022-11-24 DIAGNOSIS — M4807 Spinal stenosis, lumbosacral region: Secondary | ICD-10-CM | POA: Diagnosis not present

## 2022-11-26 DIAGNOSIS — R262 Difficulty in walking, not elsewhere classified: Secondary | ICD-10-CM | POA: Diagnosis not present

## 2022-11-26 DIAGNOSIS — M25652 Stiffness of left hip, not elsewhere classified: Secondary | ICD-10-CM | POA: Diagnosis not present

## 2022-11-26 DIAGNOSIS — M4807 Spinal stenosis, lumbosacral region: Secondary | ICD-10-CM | POA: Diagnosis not present

## 2022-11-26 DIAGNOSIS — M25651 Stiffness of right hip, not elsewhere classified: Secondary | ICD-10-CM | POA: Diagnosis not present

## 2022-12-02 DIAGNOSIS — M25652 Stiffness of left hip, not elsewhere classified: Secondary | ICD-10-CM | POA: Diagnosis not present

## 2022-12-02 DIAGNOSIS — M25651 Stiffness of right hip, not elsewhere classified: Secondary | ICD-10-CM | POA: Diagnosis not present

## 2022-12-02 DIAGNOSIS — M4807 Spinal stenosis, lumbosacral region: Secondary | ICD-10-CM | POA: Diagnosis not present

## 2022-12-02 DIAGNOSIS — R262 Difficulty in walking, not elsewhere classified: Secondary | ICD-10-CM | POA: Diagnosis not present

## 2022-12-04 DIAGNOSIS — R262 Difficulty in walking, not elsewhere classified: Secondary | ICD-10-CM | POA: Diagnosis not present

## 2022-12-04 DIAGNOSIS — M25651 Stiffness of right hip, not elsewhere classified: Secondary | ICD-10-CM | POA: Diagnosis not present

## 2022-12-04 DIAGNOSIS — M4807 Spinal stenosis, lumbosacral region: Secondary | ICD-10-CM | POA: Diagnosis not present

## 2022-12-04 DIAGNOSIS — M25652 Stiffness of left hip, not elsewhere classified: Secondary | ICD-10-CM | POA: Diagnosis not present

## 2022-12-09 ENCOUNTER — Other Ambulatory Visit: Payer: Self-pay | Admitting: Internal Medicine

## 2022-12-09 DIAGNOSIS — M25652 Stiffness of left hip, not elsewhere classified: Secondary | ICD-10-CM | POA: Diagnosis not present

## 2022-12-09 DIAGNOSIS — M25651 Stiffness of right hip, not elsewhere classified: Secondary | ICD-10-CM | POA: Diagnosis not present

## 2022-12-09 DIAGNOSIS — R262 Difficulty in walking, not elsewhere classified: Secondary | ICD-10-CM | POA: Diagnosis not present

## 2022-12-09 DIAGNOSIS — Z1231 Encounter for screening mammogram for malignant neoplasm of breast: Secondary | ICD-10-CM

## 2022-12-09 DIAGNOSIS — M4807 Spinal stenosis, lumbosacral region: Secondary | ICD-10-CM | POA: Diagnosis not present

## 2022-12-10 ENCOUNTER — Other Ambulatory Visit: Payer: Self-pay | Admitting: Surgery

## 2022-12-10 DIAGNOSIS — I7121 Aneurysm of the ascending aorta, without rupture: Secondary | ICD-10-CM

## 2022-12-11 DIAGNOSIS — M25651 Stiffness of right hip, not elsewhere classified: Secondary | ICD-10-CM | POA: Diagnosis not present

## 2022-12-11 DIAGNOSIS — M25652 Stiffness of left hip, not elsewhere classified: Secondary | ICD-10-CM | POA: Diagnosis not present

## 2022-12-11 DIAGNOSIS — M4807 Spinal stenosis, lumbosacral region: Secondary | ICD-10-CM | POA: Diagnosis not present

## 2022-12-11 DIAGNOSIS — R262 Difficulty in walking, not elsewhere classified: Secondary | ICD-10-CM | POA: Diagnosis not present

## 2022-12-16 DIAGNOSIS — Z45321 Encounter for adjustment and management of cochlear device: Secondary | ICD-10-CM | POA: Diagnosis not present

## 2022-12-16 DIAGNOSIS — Z9621 Cochlear implant status: Secondary | ICD-10-CM | POA: Diagnosis not present

## 2022-12-16 DIAGNOSIS — H903 Sensorineural hearing loss, bilateral: Secondary | ICD-10-CM | POA: Diagnosis not present

## 2022-12-17 DIAGNOSIS — M25651 Stiffness of right hip, not elsewhere classified: Secondary | ICD-10-CM | POA: Diagnosis not present

## 2022-12-17 DIAGNOSIS — M4807 Spinal stenosis, lumbosacral region: Secondary | ICD-10-CM | POA: Diagnosis not present

## 2022-12-17 DIAGNOSIS — R262 Difficulty in walking, not elsewhere classified: Secondary | ICD-10-CM | POA: Diagnosis not present

## 2022-12-17 DIAGNOSIS — M25652 Stiffness of left hip, not elsewhere classified: Secondary | ICD-10-CM | POA: Diagnosis not present

## 2022-12-19 DIAGNOSIS — M25652 Stiffness of left hip, not elsewhere classified: Secondary | ICD-10-CM | POA: Diagnosis not present

## 2022-12-19 DIAGNOSIS — M4807 Spinal stenosis, lumbosacral region: Secondary | ICD-10-CM | POA: Diagnosis not present

## 2022-12-19 DIAGNOSIS — M25651 Stiffness of right hip, not elsewhere classified: Secondary | ICD-10-CM | POA: Diagnosis not present

## 2022-12-19 DIAGNOSIS — R262 Difficulty in walking, not elsewhere classified: Secondary | ICD-10-CM | POA: Diagnosis not present

## 2022-12-30 DIAGNOSIS — M25652 Stiffness of left hip, not elsewhere classified: Secondary | ICD-10-CM | POA: Diagnosis not present

## 2022-12-30 DIAGNOSIS — R262 Difficulty in walking, not elsewhere classified: Secondary | ICD-10-CM | POA: Diagnosis not present

## 2022-12-30 DIAGNOSIS — M4807 Spinal stenosis, lumbosacral region: Secondary | ICD-10-CM | POA: Diagnosis not present

## 2022-12-30 DIAGNOSIS — M25651 Stiffness of right hip, not elsewhere classified: Secondary | ICD-10-CM | POA: Diagnosis not present

## 2023-01-01 ENCOUNTER — Other Ambulatory Visit: Payer: Self-pay | Admitting: Cardiology

## 2023-01-01 DIAGNOSIS — M25652 Stiffness of left hip, not elsewhere classified: Secondary | ICD-10-CM | POA: Diagnosis not present

## 2023-01-01 DIAGNOSIS — R262 Difficulty in walking, not elsewhere classified: Secondary | ICD-10-CM | POA: Diagnosis not present

## 2023-01-01 DIAGNOSIS — M25651 Stiffness of right hip, not elsewhere classified: Secondary | ICD-10-CM | POA: Diagnosis not present

## 2023-01-01 DIAGNOSIS — M4807 Spinal stenosis, lumbosacral region: Secondary | ICD-10-CM | POA: Diagnosis not present

## 2023-01-06 DIAGNOSIS — M25652 Stiffness of left hip, not elsewhere classified: Secondary | ICD-10-CM | POA: Diagnosis not present

## 2023-01-06 DIAGNOSIS — R262 Difficulty in walking, not elsewhere classified: Secondary | ICD-10-CM | POA: Diagnosis not present

## 2023-01-06 DIAGNOSIS — M4807 Spinal stenosis, lumbosacral region: Secondary | ICD-10-CM | POA: Diagnosis not present

## 2023-01-06 DIAGNOSIS — M25651 Stiffness of right hip, not elsewhere classified: Secondary | ICD-10-CM | POA: Diagnosis not present

## 2023-01-07 ENCOUNTER — Encounter: Payer: Self-pay | Admitting: Obstetrics and Gynecology

## 2023-01-07 ENCOUNTER — Ambulatory Visit: Payer: Medicare Other | Admitting: Obstetrics and Gynecology

## 2023-01-07 VITALS — BP 110/77 | HR 89

## 2023-01-07 DIAGNOSIS — R159 Full incontinence of feces: Secondary | ICD-10-CM

## 2023-01-07 DIAGNOSIS — N3281 Overactive bladder: Secondary | ICD-10-CM

## 2023-01-07 NOTE — Progress Notes (Signed)
[facility]  Date of Visit: 01/07/2023  History of Present Illness: Ms. Blodgett is a 77 y.o. female scheduled today for follow up for OAB and bowel leakage.  Surgery: s/p sacral neuromodulation (Interstim) stage I on 05/19/22 and stage II on 06/02/22 for overactive bladder and fecal incontinence  Overall the Interstim is working well for her. Controls bladder symptoms about 80%. More recently she has been having some issues with bowel leakage when she is stressed and stools are looser. She is taking a fiber supplement as well. Bowel leakage is still improved about 50%. She did increase Program 3 to 1.5 amp  (previously on 1.2). She did not bring her controller today.     Medications: She has a current medication list which includes the following prescription(s): ascorbic acid, aspirin, bimatoprost, calcium, cholecalciferol, cyanocobalamin, fluticasone, hydrochlorothiazide, ibuprofen, levothyroxine, losartan, meclizine, probiotic product, and spiriva respimat.   Allergies: Patient is allergic to morphine, morphine and related, statins, tape, wellbutrin [bupropion], and denture adhesive.   Physical Exam: BP 110/77   Pulse 89    Gen: No distress. AAO x3  ---------------------------------------------------------  Assessment and Plan:  1. Overactive bladder   2. Incontinence of feces, unspecified fecal incontinence type     - Overall symptoms well controlled.  - Will keep current program 3 at amplitude 1.5  Will follow up in 1 year or sooner if needed  [name], MD  Time spent: I spent 30 minutes dedicated to the care of this patient on the date of this encounter to include pre-visit review of records, face-to-face time with the patient  and post visit documentation.

## 2023-01-08 DIAGNOSIS — R262 Difficulty in walking, not elsewhere classified: Secondary | ICD-10-CM | POA: Diagnosis not present

## 2023-01-08 DIAGNOSIS — M25651 Stiffness of right hip, not elsewhere classified: Secondary | ICD-10-CM | POA: Diagnosis not present

## 2023-01-08 DIAGNOSIS — M25652 Stiffness of left hip, not elsewhere classified: Secondary | ICD-10-CM | POA: Diagnosis not present

## 2023-01-08 DIAGNOSIS — M4807 Spinal stenosis, lumbosacral region: Secondary | ICD-10-CM | POA: Diagnosis not present

## 2023-01-13 DIAGNOSIS — M4807 Spinal stenosis, lumbosacral region: Secondary | ICD-10-CM | POA: Diagnosis not present

## 2023-01-13 DIAGNOSIS — M25652 Stiffness of left hip, not elsewhere classified: Secondary | ICD-10-CM | POA: Diagnosis not present

## 2023-01-13 DIAGNOSIS — R262 Difficulty in walking, not elsewhere classified: Secondary | ICD-10-CM | POA: Diagnosis not present

## 2023-01-13 DIAGNOSIS — M25651 Stiffness of right hip, not elsewhere classified: Secondary | ICD-10-CM | POA: Diagnosis not present

## 2023-01-15 DIAGNOSIS — M25652 Stiffness of left hip, not elsewhere classified: Secondary | ICD-10-CM | POA: Diagnosis not present

## 2023-01-15 DIAGNOSIS — M25651 Stiffness of right hip, not elsewhere classified: Secondary | ICD-10-CM | POA: Diagnosis not present

## 2023-01-15 DIAGNOSIS — R262 Difficulty in walking, not elsewhere classified: Secondary | ICD-10-CM | POA: Diagnosis not present

## 2023-01-15 DIAGNOSIS — M4807 Spinal stenosis, lumbosacral region: Secondary | ICD-10-CM | POA: Diagnosis not present

## 2023-02-16 ENCOUNTER — Encounter: Payer: Self-pay | Admitting: *Deleted

## 2023-02-18 DIAGNOSIS — H903 Sensorineural hearing loss, bilateral: Secondary | ICD-10-CM | POA: Diagnosis not present

## 2023-02-19 ENCOUNTER — Ambulatory Visit
Admission: RE | Admit: 2023-02-19 | Discharge: 2023-02-19 | Disposition: A | Discharge status: Home / Self Care | Payer: Medicare Other | Source: Ambulatory Visit | Attending: Internal Medicine | Admitting: Internal Medicine

## 2023-02-19 DIAGNOSIS — Z1231 Encounter for screening mammogram for malignant neoplasm of breast: Secondary | ICD-10-CM

## 2023-02-20 ENCOUNTER — Encounter: Payer: Self-pay | Admitting: Surgery

## 2023-02-23 DIAGNOSIS — R32 Unspecified urinary incontinence: Secondary | ICD-10-CM | POA: Diagnosis not present

## 2023-02-23 DIAGNOSIS — E890 Postprocedural hypothyroidism: Secondary | ICD-10-CM | POA: Diagnosis not present

## 2023-02-23 DIAGNOSIS — I10 Essential (primary) hypertension: Secondary | ICD-10-CM | POA: Diagnosis not present

## 2023-02-23 DIAGNOSIS — Z1331 Encounter for screening for depression: Secondary | ICD-10-CM | POA: Diagnosis not present

## 2023-02-23 DIAGNOSIS — E7800 Pure hypercholesterolemia, unspecified: Secondary | ICD-10-CM | POA: Diagnosis not present

## 2023-02-23 DIAGNOSIS — M8589 Other specified disorders of bone density and structure, multiple sites: Secondary | ICD-10-CM | POA: Diagnosis not present

## 2023-02-23 DIAGNOSIS — J439 Emphysema, unspecified: Secondary | ICD-10-CM | POA: Diagnosis not present

## 2023-02-23 DIAGNOSIS — L821 Other seborrheic keratosis: Secondary | ICD-10-CM | POA: Diagnosis not present

## 2023-02-23 DIAGNOSIS — I7121 Aneurysm of the ascending aorta, without rupture: Secondary | ICD-10-CM | POA: Diagnosis not present

## 2023-02-23 DIAGNOSIS — Z0000 Encounter for general adult medical examination without abnormal findings: Secondary | ICD-10-CM | POA: Diagnosis not present

## 2023-02-24 ENCOUNTER — Other Ambulatory Visit: Payer: Self-pay | Admitting: Internal Medicine

## 2023-02-24 DIAGNOSIS — M8589 Other specified disorders of bone density and structure, multiple sites: Secondary | ICD-10-CM

## 2023-02-24 DIAGNOSIS — M25652 Stiffness of left hip, not elsewhere classified: Secondary | ICD-10-CM | POA: Diagnosis not present

## 2023-02-24 DIAGNOSIS — R262 Difficulty in walking, not elsewhere classified: Secondary | ICD-10-CM | POA: Diagnosis not present

## 2023-02-24 DIAGNOSIS — M25651 Stiffness of right hip, not elsewhere classified: Secondary | ICD-10-CM | POA: Diagnosis not present

## 2023-02-24 DIAGNOSIS — M4807 Spinal stenosis, lumbosacral region: Secondary | ICD-10-CM | POA: Diagnosis not present

## 2023-02-25 ENCOUNTER — Ambulatory Visit: Payer: Medicare Other | Admitting: Surgery

## 2023-02-25 ENCOUNTER — Encounter: Payer: Self-pay | Admitting: Surgery

## 2023-02-25 ENCOUNTER — Ambulatory Visit
Admission: RE | Admit: 2023-02-25 | Discharge: 2023-02-25 | Disposition: A | Discharge status: Home / Self Care | Payer: Medicare Other | Source: Ambulatory Visit | Attending: Surgery | Admitting: Surgery

## 2023-02-25 VITALS — BP 137/83 | HR 78 | Resp 20 | Ht 61.0 in | Wt 137.0 lb

## 2023-02-25 DIAGNOSIS — I359 Nonrheumatic aortic valve disorder, unspecified: Secondary | ICD-10-CM | POA: Diagnosis not present

## 2023-02-25 DIAGNOSIS — I7121 Aneurysm of the ascending aorta, without rupture: Secondary | ICD-10-CM

## 2023-02-25 DIAGNOSIS — I2510 Atherosclerotic heart disease of native coronary artery without angina pectoris: Secondary | ICD-10-CM | POA: Diagnosis not present

## 2023-02-25 DIAGNOSIS — K449 Diaphragmatic hernia without obstruction or gangrene: Secondary | ICD-10-CM | POA: Diagnosis not present

## 2023-02-25 DIAGNOSIS — E890 Postprocedural hypothyroidism: Secondary | ICD-10-CM | POA: Diagnosis not present

## 2023-02-25 MED ORDER — IOPAMIDOL (ISOVUE-370) INJECTION 76%
75.0000 mL | Freq: Once | INTRAVENOUS | Status: AC | PRN
  Administered 2023-02-25: 75 mL via INTRAVENOUS

## 2023-02-25 NOTE — Progress Notes (Signed)
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  I last saw her on 01/16/2021.  She continues to feel well without chest or back pain.  Current Outpatient Medications  Medication Sig Dispense Refill   ascorbic acid (VITAMIN C) 1000 MG tablet Take 500 mg by mouth daily.      aspirin 81 MG chewable tablet Chew 81 mg by mouth daily.      bimatoprost (LUMIGAN) 0.01 % SOLN Place 1 drop into both eyes at bedtime.     CALCIUM PO Take 1 tablet by mouth daily.     Cholecalciferol (VITAMIN D3 PO) Take 1 tablet by mouth daily.     Cyanocobalamin (VITAMIN B-12 PO) Take 1 tablet by mouth daily.     fluticasone (FLONASE) 50 MCG/ACT nasal spray Place 2 sprays into both nostrils at bedtime.     hydrochlorothiazide (HYDRODIURIL) 25 MG tablet Take 1 tablet (25 mg total) by mouth daily. Please contact our office to schedule an overdue appointment with Dr. Marlou Porch for future refills. [phone]. Thank you. 1st attempt. 30 tablet 0   ibuprofen (ADVIL) 600 MG tablet Take 1 tablet (600 mg total) by mouth every 6 (six) hours as needed. 30 tablet 0   levothyroxine (SYNTHROID) 112 MCG tablet Take 1 tablet (112 mcg total) by mouth daily. 90 tablet 3   losartan (COZAAR) 100 MG tablet TAKE 1 TABLET(100 MG) BY MOUTH DAILY 90 tablet 2   meclizine (ANTIVERT) 25 MG tablet Take 1 tablet (25 mg total) by mouth 3 (three) times daily as needed for dizziness. 90 tablet 9   Probiotic Product (PROBIOTIC ADVANCED PO) Take 1 capsule by mouth daily.     Tiotropium Bromide Monohydrate (SPIRIVA RESPIMAT) 2.5 MCG/ACT AERS Inhale 2 puffs into the lungs daily. 4 g 0   No current facility-administered medications for this visit.     Physical Exam: BP 137/83 (BP Location: Left Arm, Patient Position: Sitting)   Pulse 78   Resp 20   Ht '5\' 1"'$  (1.549 m)   Wt 137 lb (62.1 kg)   SpO2 94% Comment: RA  BMI 25.89  kg/m  She looks well. Cardiac exam shows regular rate and rhythm with normal heart sounds.  There is no murmur. Lungs are clear.  Diagnostic Tests:  Narrative & Impression  CLINICAL DATA:  Follow-up of thoracic aortic aneurysm. Prior thyroidectomy. Emphysema. Hiatal hernia.   EXAM: CT ANGIOGRAPHY CHEST WITH CONTRAST   TECHNIQUE: Multidetector CT imaging of the chest was performed using the standard protocol during bolus administration of intravenous contrast. Multiplanar CT image reconstructions and MIPs were obtained to evaluate the vascular anatomy.   RADIATION DOSE REDUCTION: This exam was performed according to the departmental dose-optimization program which includes automated exposure control, adjustment of the mA and/or kV according to patient size and/or use of iterative reconstruction technique.   CONTRAST:  16m ISOVUE-370 IOPAMIDOL (ISOVUE-370) INJECTION 76%   COMPARISON:  01/21/2022   FINDINGS: Cardiovascular: Normal caliber of the great vessels. Advanced aortic atherosclerosis. No dissection.   Ascending aorta measures 4.1 cm on 51/4 versus 4.1 cm at the same level on the prior exam. Based on coronal reformats, the aorta measures 3.1 cm at the sinuses, 2.7 cm at the sinotubular junction, and maximally 4.1 cm in the mid ascending segment on coronal image 57.   Normal transverse and descending thoracic aortic caliber. Mild  cardiomegaly, without pericardial effusion. Three vessel coronary artery calcification. No central pulmonary embolism, on this non-dedicated study.   Mediastinum/Nodes: Thyroidectomy. No mediastinal or hilar adenopathy. Small hiatal hernia.   Lungs/Pleura: No pleural fluid. Mild biapical pleuroparenchymal scarring.   Mild centrilobular emphysema.   No change in a left lower lobe 8 mm pulmonary nodule on 74/11, considered benign.   A 4 mm left upper lobe pulmonary nodule on 58/11 is similar and may represent a granuloma. Can be  presumed benign and do/does not warrant imaging follow-up per Fleischner criteria.   Upper Abdomen: Normal imaged portions of the liver, spleen, pancreas, adrenal glands, left kidney. Incompletely imaged interpolar right renal 1.5 cm fluid density lesion is most consistent with a cyst . In the absence of clinically indicated signs/symptoms require(s) no independent follow-up. Colonic diverticulosis.   Musculoskeletal: Thoracic spondylosis.   Review of the MIP images confirms the above findings.   IMPRESSION: 1. Similar ascending aortic dilatation, mild, at maximally 4.1 cm. Recommend annual imaging followup by CTA or MRA. This recommendation follows 2010 ACCF/AHA/AATS/ACR/ASA/SCA/SCAI/SIR/STS/SVM Guidelines for the Diagnosis and Management of Patients with Thoracic Aortic Disease. Circulation. 2010; [id number]. Aortic aneurysm NOS (ICD10-I71.9) 2.  No acute process in the chest. 3. Aortic atherosclerosis (ICD10-I70.0), coronary artery atherosclerosis and emphysema (ICD10-J43.9). 4. No change in left-sided pulmonary nodules which are considered benign. 5. Small hiatal hernia     Electronically Signed   By: Abigail Miyamoto M.D.   On: 02/25/2023 10:42      Impression:  She has a stable 4.1 cm fusiform ascending aortic aneurysm.  This is well below the surgical threshold of 5.5 cm.  The small left lower lobe pulmonary nodule is unchanged and certainly benign.  I reviewed the CTA images with her and answered her questions.  I stressed the importance of continued good blood pressure control in preventing further enlargement and acute aortic dissection.  Plan:  She will return to see me in 2 years with a CTA of the chest for aortic surveillance.  I spent 15 minutes performing this established patient evaluation and > 50% of this time was spent face to face counseling and coordinating the care of this patient's aortic aneurysm.    Gaye Pollack, MD [facility] [phone]

## 2023-02-26 DIAGNOSIS — M4807 Spinal stenosis, lumbosacral region: Secondary | ICD-10-CM | POA: Diagnosis not present

## 2023-02-26 DIAGNOSIS — R262 Difficulty in walking, not elsewhere classified: Secondary | ICD-10-CM | POA: Diagnosis not present

## 2023-02-26 DIAGNOSIS — M25651 Stiffness of right hip, not elsewhere classified: Secondary | ICD-10-CM | POA: Diagnosis not present

## 2023-02-26 DIAGNOSIS — M25652 Stiffness of left hip, not elsewhere classified: Secondary | ICD-10-CM | POA: Diagnosis not present

## 2023-03-02 ENCOUNTER — Ambulatory Visit: Payer: Medicare Other | Attending: Cardiology | Admitting: Cardiology

## 2023-03-02 ENCOUNTER — Encounter: Payer: Self-pay | Admitting: Cardiology

## 2023-03-02 VITALS — BP 116/78 | HR 95 | Ht 61.0 in | Wt 138.0 lb

## 2023-03-02 DIAGNOSIS — I77810 Thoracic aortic ectasia: Secondary | ICD-10-CM

## 2023-03-02 DIAGNOSIS — I2584 Coronary atherosclerosis due to calcified coronary lesion: Secondary | ICD-10-CM | POA: Diagnosis not present

## 2023-03-02 DIAGNOSIS — E782 Mixed hyperlipidemia: Secondary | ICD-10-CM

## 2023-03-02 DIAGNOSIS — I10 Essential (primary) hypertension: Secondary | ICD-10-CM

## 2023-03-02 DIAGNOSIS — I2510 Atherosclerotic heart disease of native coronary artery without angina pectoris: Secondary | ICD-10-CM | POA: Diagnosis not present

## 2023-03-02 DIAGNOSIS — G4733 Obstructive sleep apnea (adult) (pediatric): Secondary | ICD-10-CM | POA: Diagnosis not present

## 2023-03-02 MED ORDER — HYDROCHLOROTHIAZIDE 25 MG PO TABS
25.0000 mg | ORAL_TABLET | Freq: Every day | ORAL | 3 refills | Status: AC
Start: 2023-03-02 — End: ?

## 2023-03-02 MED ORDER — OMEGA-3-ACID ETHYL ESTERS 1 G PO CAPS: 2.0000 g | ORAL_CAPSULE | Freq: Two times a day (BID) | ORAL | 3 refills | Status: DC

## 2023-03-02 MED ORDER — LOSARTAN POTASSIUM 100 MG PO TABS: ORAL_TABLET | ORAL | 3 refills | Status: AC

## 2023-03-02 NOTE — Progress Notes (Signed)
Cardiology Office Note:    Date:  03/02/2023   ID:  Rachel Gardner, DOB 1946/04/02, MRN [id number]  PCP:  Charlane Ferretti, MD  [facility] Cardiologist:  Candee Furbish, MD  [facility] Electrophysiologist:  None   Referring MD: Charlane Ferretti, MD     History of Present Illness:    Rachel Gardner is a 76 y.o. female here for the follow-up of coronary artery calcification dilated aortic root aortic atherosclerosis, mixed hyperlipidemia.  4.4 cm previously described but upon closer inspection 4 cm on 01/25/20 CT scan.  In fact most recent CT scan showed 4.1 cm.  She has seen Dr. Cyndia Bent in the past.  We can space out imaging.  Left lower lobe nodule 7 mm stable.  Extensive coronary calcification also seen on CT.  Nuclear stress test was reassuring in 2017 when this was discovered with prior exertional tiredness and neck tightness.  2013 had BPPV in [place]. Has a cochlear implant. Has had extensive neurologic workup as well as all reassuring.    Statin intolerance- Lipitor, crestor, pravastatin, zocor,  - muscle pain. LDL 115. Stopped taking Zetia, trying to eat- celery, juice.  She does have elevated triglycerides in the 376 range.  Starting Lovaza.   Quit smoking 12/2015. Retired Marine scientist. She enjoyed traveling from [place] to [name], [place], [place] for her 57 birthday. No fevers chills nausea vomiting syncope bleeding   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   Coronary artery calcification    cardiologist--- dr Marlou Porch   DDD (degenerative disc disease), lumbosacral    Fecal incontinence    intermittant , wears depends   Genital herpes    GERD (gastroesophageal reflux disease)    Glaucoma, both eyes    Hiatal hernia    History of adenomatous polyp of colon    History of diverticulitis of colon    w/ hx sugical intervention hemicolectomy in 2012   History of  Helicobacter pylori infection    History of kidney stones    Hyperlipidemia, mixed    Hypertension    Hypothyroidism, postsurgical    endocrinologist-- dr Loanne Drilling;   s/p total thyroidectomy for goiter, per dr Loanne Drilling note benign   Incontinence of urine in female    wears depends   Lung nodule seen on imaging study    followed by dr Chase Caller;  per lov note chronic stable, LLL   Lymphadenopathy    followed by pcp;  evaluated by oncologist-- dr Cassell Smiles note 08-22-2019 in epic   Meniere's disease    OAB (overactive bladder)    OSA on CPAP    followed by dr t. Radford Pax;   sleep study in epic 10-22-2018  moderate osa   Pulmonary emphysema [facility])    pulmonology--- dr Jerilynn Mages. Chase Caller   Sensorineural hearing loss (SNHL) of both ears    s/p cochlear implant's   SOB (shortness of breath)    Thoracic ascending aortic aneurysm ([place])    followed by dr Cyndia Bent--- 4.1 cm per 01-21-2022 chest ct   Wears glasses    Wears partial dentures upper and lower     Past Surgical History:  Procedure Laterality Date   ABDOMINAL HYSTERECTOMY  1995   ANAL RECTAL MANOMETRY N/A 08/06/2016   Procedure: ANO RECTAL MANOMETRY;  Surgeon: Doran Stabler, MD;  Location: [facility];  Service: Gastroenterology;  Laterality: N/A;  CATARACT EXTRACTION W/ INTRAOCULAR LENS IMPLANT Bilateral    yrs ago   CHOLECYSTECTOMY OPEN  1989   W/ APPENDECTOMY   COCHLEAR IMPLANT Bilateral    Left/  2009;  Right / 2012   COLONOSCOPY WITH PROPOFOL  04/10/2022   by dr h. danis   CYSTOSCOPY N/A 03/06/2022   Procedure: CYSTOSCOPY With biopsy;  Surgeon: [name], MD;  Location: [facility];  Service: Gynecology;  Laterality: N/A;  total time requested is 30 min   ESOPHAGOGASTRODUODENOSCOPY  02/03/2014   by dr d. Leanord Asal IMPLANT PLACEMENT N/A 05/19/2022   Procedure: Barrie Lyme IMPLANT FIRST STAGE;  Surgeon: [name], MD;  Location: [facility];  Service: Gynecology;   Laterality: N/A;   INTERSTIM IMPLANT PLACEMENT N/A 06/02/2022   Procedure: Barrie Lyme IMPLANT SECOND STAGE;  Surgeon: [name], MD;  Location: [facility];  Service: Gynecology;  Laterality: N/A;   LAPAROSCOPIC SIGMOID COLECTOMY  02/2011   in [place];    with takedown of splenic flexure; cystoscopy with bilateral ureteral stent placement   REDUCTION MAMMAPLASTY Bilateral 1989   [place]   TOTAL THYROIDECTOMY Bilateral 1995   WISDOM TOOTH EXTRACTION     yrs ago    Current Medications: Current Meds  Medication Sig   ascorbic acid (VITAMIN C) 1000 MG tablet Take 500 mg by mouth daily.    aspirin 81 MG chewable tablet Chew 81 mg by mouth daily.    bimatoprost (LUMIGAN) 0.01 % SOLN Place 1 drop into both eyes at bedtime.   CALCIUM PO Take 1 tablet by mouth daily.   Cholecalciferol (VITAMIN D3 PO) Take 1 tablet by mouth daily.   Cyanocobalamin (VITAMIN B-12 PO) Take 1 tablet by mouth daily.   fluticasone (FLONASE) 50 MCG/ACT nasal spray Place 2 sprays into both nostrils at bedtime.   ibuprofen (ADVIL) 600 MG tablet Take 1 tablet (600 mg total) by mouth every 6 (six) hours as needed.   levothyroxine (SYNTHROID) 112 MCG tablet Take 1 tablet (112 mcg total) by mouth daily.   meclizine (ANTIVERT) 25 MG tablet Take 1 tablet (25 mg total) by mouth 3 (three) times daily as needed for dizziness.   omega-3 acid ethyl esters (LOVAZA) 1 g capsule Take 2 capsules (2 g total) by mouth 2 (two) times daily.   Probiotic Product (PROBIOTIC ADVANCED PO) Take 1 capsule by mouth daily.   Tiotropium Bromide Monohydrate (SPIRIVA RESPIMAT) 2.5 MCG/ACT AERS Inhale 2 puffs into the lungs daily.   [DISCONTINUED] hydrochlorothiazide (HYDRODIURIL) 25 MG tablet Take 1 tablet (25 mg total) by mouth daily. Please contact our office to schedule an overdue appointment with Dr. Marlou Porch for future refills. [phone]. Thank you. 1st attempt.   [DISCONTINUED] losartan (COZAAR) 100 MG tablet TAKE 1 TABLET(100  MG) BY MOUTH DAILY     Allergies:   Morphine, Morphine and related, Statins, Tape, Wellbutrin [bupropion], and Denture adhesive   Social History   Socioeconomic History   Marital status: Divorced    Spouse name: Not on file   Number of children: 3   Years of education: Not on file   Highest education level: Not on file  Occupational History   Occupation: retired Marine scientist  Tobacco Use   Smoking status: Former    Packs/day: 0.50    Years: 41.00    Total pack years: 20.50    Types: Cigarettes    Quit date: 07/26/2021    Years since quitting: 1.6   Smokeless tobacco: Never  Tobacco comments:    Quit 4 months ago, 1/2 pack a day [facility] 11/01/21  Vaping Use   Vaping Use: Never used  Substance and Sexual Activity   Alcohol use: Not Currently    Comment: very rare   Drug use: Never   Sexual activity: Not on file  Other Topics Concern   Not on file  Social History Narrative   Regular exercise: walk; 1 mile a day / walk 5 miles weekend;    update 02/22/2020 not exercising   Caffeine use: 1-2 cups of coffee per day   3 children   Lives alone with her dogs, son lives next door   Social Determinants of Health   Financial Resource Strain: Not on file  Food Insecurity: Not on file  Transportation Needs: Not on file  Physical Activity: Not on file  Stress: Not on file  Social Connections: Not on file     Family History: The patient's family history includes Colon cancer in her cousin and maternal aunt; Hyperlipidemia in her brother; Hypertension in her father, mother, and sister; Prostate cancer in her brother and paternal uncle; Thyroid disease in her sister. There is no history of Colon polyps, Esophageal cancer, Stomach cancer, Rectal cancer, or Breast cancer.  ROS:   Please see the history of present illness.      All other systems reviewed and are negative.  EKGs/Labs/Other Studies Reviewed:    The following studies were reviewed today: CT of chest 01/17/2022: Ascending  aorta 4.1 cm.  CT Chest 01/16/21 1. Aortic atherosclerosis, in addition to left main and 3 vessel coronary artery disease, as well as similar ectasia of ascending thoracic aorta (4.2 cm in diameter). Recommend annual imaging followup by CTA or MRA. This recommendation follows 2010 ACCF/AHA/AATS/ACR/ASA/SCA/SCAI/SIR/STS/SVM Guidelines for the Diagnosis and Management of Patients with Thoracic Aortic Disease. Circulation. 2010; 121JN:9224643. Aortic aneurysm NOS (ICD10-I71.9) 2. 9 mm smoothly marginated left lower lobe pulmonary nodule, stable compared to prior examinations, considered definitively benign. 3. Colonic diverticulosis. Aortic Atherosclerosis (ICD10-I70.0).  CT Chest 01/25/20 1. Stable 4.1 cm ascending thoracic aortic aneurysm. Recommend annual imaging followup by CTA or MRA. This recommendation follows 2010 ACCF/AHA/AATS/ACR/ASA/SCA/SCAI/SIR/STS/SVM Guidelines for the Diagnosis and Management of Patients with Thoracic Aortic Disease. Circulation. 2010; 121: e266-e369 2. 9 mm left lower lobe pulmonary nodule, minimally increased since 2016, probably benign. 3. Coronary calcifications. The severity of coronary artery disease and any potential stenosis cannot be assessed on this non-gated CT examination.  Nuclear stress test 11/02/2019 Nuclear stress EF: 67%. There were no wall motion abnormalities The left ventricular ejection fraction is normal (55-65%). There was no ST segment deviation noted during stress. The study is normal. This is a low risk study  EKG:  EKG was personally reviewed 03/02/2023: NSR 95 inf inf. 11/13/21: Sinus rhythm, rate 90 bpm  11/12/20: sinus rhythm 92 no ischemia  Recent Labs: 06/02/2022: BUN 34; Creatinine, Ser 1.30; Hemoglobin 12.6; Potassium 3.7; Sodium 137  Recent Lipid Panel    Component Value Date/Time   CHOL 216 (H) 12/17/2020 0830   CHOL 222 (H) 12/16/2016 0826   TRIG 191.0 (H) 12/17/2020 0830   HDL 39.00 (L) 12/17/2020 0830    HDL 48 12/16/2016 0826   CHOLHDL 6 12/17/2020 0830   VLDL 38.2 12/17/2020 0830   LDLCALC 138 (H) 12/17/2020 0830   LDLCALC 138 (H) 12/16/2016 0826   LDLDIRECT 166.0 02/07/2019 0907     Risk Assessment/Calculations:       Physical Exam:    VS:  BP 116/78   Pulse 95   Ht '5\' 1"'$  (1.549 m)   Wt 138 lb (62.6 kg)   SpO2 96%   BMI 26.07 kg/m     Wt Readings from Last 3 Encounters:  03/02/23 138 lb (62.6 kg)  02/25/23 137 lb (62.1 kg)  06/02/22 134 lb 8 oz (61 kg)     GEN: Well nourished, well developed, in no acute distress HEENT: normal Neck: no JVD, carotid bruits, or masses Cardiac: RRR; no murmurs, rubs, or gallops,no edema  Respiratory:  clear to auscultation bilaterally, normal work of breathing GI: soft, nontender, nondistended, + BS MS: no deformity or atrophy Skin: warm and dry, no rash Neuro:  Alert and Oriented x 3, Strength and sensation are intact Psych: euthymic mood, full affect   ASSESSMENT:    1. Dilated aortic root ([place])   2. Primary hypertension   3. Mixed hyperlipidemia   4. OSA (obstructive sleep apnea)   5. Coronary artery calcification      PLAN:      Thoracic ascending aortic aneurysm (HCC) 4.1 - 4.2 cm stable.  Recent Dr. Cyndia Bent visit: She has a stable 4.1 cm fusiform ascending aortic aneurysm. This is well below the surgical threshold of 5.5 cm.   Aortic atherosclerosis (H[facility]) Encourage statin therapy in the past.  She is not interested.  Coronary artery calcification Continue to encourage lifestyle modification.  Not interested in statin.  Nuclear stress test 2020 reassuring.  Mixed hyperlipidemia Discussed.  We will try Lovaza.  Triglycerides were 376.  LDL 94.  Not interested again in statin.  Shortness of breath Echocardiogram.  She has felt poor after her viral episode.  Non-COVID. No Smoking since 2022.  OSA Will get [facility] Appt through Dr. Louis Matte.    Shared Decision Making/Informed Consent        Medication  Adjustments/Labs and Tests Ordered: Current medicines are reviewed at length with the patient today.  Concerns regarding medicines are outlined above.  Orders Placed This Encounter  Procedures   EKG 12-Lead    Meds ordered this encounter  Medications   losartan (COZAAR) 100 MG tablet    Sig: TAKE 1 TABLET(100 MG) BY MOUTH DAILY    Dispense:  90 tablet    Refill:  3   hydrochlorothiazide (HYDRODIURIL) 25 MG tablet    Sig: Take 1 tablet (25 mg total) by mouth daily.    Dispense:  90 tablet    Refill:  3   omega-3 acid ethyl esters (LOVAZA) 1 g capsule    Sig: Take 2 capsules (2 g total) by mouth 2 (two) times daily.    Dispense:  360 capsule    Refill:  3    Patient Instructions  Medication Instructions:  Please start Lovaza 1 gr - take 2 capsules twice a day. Continue all other medications as listed.  *If you need a refill on your cardiac medications before your next appointment, please call your pharmacy*  Follow-Up: At [facility], you and your health needs are our priority.  As part of our continuing mission to provide you with exceptional heart care, we have created designated Provider Care Teams.  These Care Teams include your primary Cardiologist (physician) and Advanced Practice Providers (APPs -  Physician Assistants and Nurse Practitioners) who all work together to provide you with the care you need, when you need it.  We recommend signing up for the patient portal called "MyChart".  Sign up information is provided on this After Visit Summary.  MyChart is used to connect with patients for Virtual Visits (Telemedicine).  Patients are able to view lab/test results, encounter notes, upcoming appointments, etc.  Non-urgent messages can be sent to your provider as well.   To learn more about what you can do with MyChart, go to [email or web address].    Your next appointment:   1 year(s)  Provider:   Candee Furbish, MD      Fish Oil, Omega-3 Fatty Acids Capsules  (Rx) What is this medication? FISH OIL, OMEGA-3 FATTY ACIDS (fish oyl, oh MEH guh three FA tee A suhds) treats high triglyceride levels in your blood. It works by decreasing bad cholesterol and fats (triglycerides) in your blood. Changes to diet and exercise are often combined with this medication. This medicine may be used for other purposes; ask your health care provider or pharmacist if you have questions. COMMON BRAND NAME(S): Lovaza, Magna Omega-3, Ocean Blue Nutritionals Omega-3 1450, Ocean Blue Omega, [place] Professional Omega-3 2100, O[place]or, Omega-3, Ovega-3, Triklo What should I tell my care team before I take this medication? They need to know if you have any of these conditions: Diabetes (high blood sugar) Irregular heartbeat or rhythm Liver disease Low thyroid levels Pancreatic disease An unusual or allergic reaction to fish oil, omega-3 fatty acids, fish, shellfish, other medications, foods, dyes, or preservatives Pregnant or trying to get pregnant Breast-feeding How should I use this medication? Take this medication by mouth. Take it as directed on the prescription label at the same time every day. Do not cut, crush or chew this medication. Swallow the capsules whole. Take it with food. Keep taking it unless your care team tells you to stop. Talk to your care team about the use of this medication in children. Special care may be needed. Overdosage: If you think you have taken too much of this medicine contact a poison control center or emergency room at once. NOTE: This medicine is only for you. Do not share this medicine with others. What if I miss a dose? If you miss a dose, take it as soon as you can. If it is almost time for your next dose, take only that dose. Do not take double or extra doses. What may interact with this medication? Aspirin and aspirin-like medications Herbal products like danshen, dong quai, garlic pills, ginger, ginkgo biloba, horse chestnut, willow  bark, and others Medications that treat or prevent blood clots, such as enoxaparin, heparin, warfarin This list may not describe all possible interactions. Give your health care provider a list of all the medicines, herbs, non-prescription drugs, or dietary supplements you use. Also tell them if you smoke, drink alcohol, or use illegal drugs. Some items may interact with your medicine. What should I watch for while using this medication? Visit your care team for regular checks on your progress. It may be some time before you see the benefit from this medication. This medication may increase your risk to bruise or bleed. Call your care team if you notice any unusual bleeding. You may need blood work while taking this medication. Taking this medication is only part of a total heart healthy program. Ask your care team if there are other changes you can make to improve your overall health. What side effects may I notice from receiving this medication? Side effects that you should report to your care team as soon as possible: Allergic reactions--skin rash, itching, hives, swelling of the face, lips, tongue, or throat Side effects that usually do not require medical  attention (report to your care team if they continue or are bothersome): Bad breath Burping Fishy aftertaste Heartburn Upset stomach This list may not describe all possible side effects. Call your doctor for medical advice about side effects. You may report side effects to FDA at 1-800-FDA-1088. Where should I keep my medication? Keep out of the reach of children and pets. Store at room temperature between 15 and 30 degrees C (59 and 86 degrees F). Do not freeze. Get rid of any unused medication after the expiration date. NOTE: This sheet is a summary. It may not cover all possible information. If you have questions about this medicine, talk to your doctor, pharmacist, or health care provider.  2023 Elsevier/Gold Standard (2022-07-23  00:00:00)     Signed, Candee Furbish, MD  03/02/2023 1:51 PM    [facility]

## 2023-03-02 NOTE — Patient Instructions (Signed)
Medication Instructions:  Please start Lovaza 1 gr - take 2 capsules twice a day. Continue all other medications as listed.  *If you need a refill on your cardiac medications before your next appointment, please call your pharmacy*  Follow-Up: At [facility], you and your health needs are our priority.  As part of our continuing mission to provide you with exceptional heart care, we have created designated Provider Care Teams.  These Care Teams include your primary Cardiologist (physician) and Advanced Practice Providers (APPs -  Physician Assistants and Nurse Practitioners) who all work together to provide you with the care you need, when you need it.  We recommend signing up for the patient portal called "MyChart".  Sign up information is provided on this After Visit Summary.  MyChart is used to connect with patients for Virtual Visits (Telemedicine).  Patients are able to view lab/test results, encounter notes, upcoming appointments, etc.  Non-urgent messages can be sent to your provider as well.   To learn more about what you can do with MyChart, go to [email or web address].    Your next appointment:   1 year(s)  Provider:   Candee Furbish, MD      Fish Oil, Omega-3 Fatty Acids Capsules (Rx) What is this medication? FISH OIL, OMEGA-3 FATTY ACIDS (fish oyl, oh MEH guh three FA tee A suhds) treats high triglyceride levels in your blood. It works by decreasing bad cholesterol and fats (triglycerides) in your blood. Changes to diet and exercise are often combined with this medication. This medicine may be used for other purposes; ask your health care provider or pharmacist if you have questions. COMMON BRAND NAME(S): Lovaza, Magna Omega-3, Ocean Blue Nutritionals Omega-3 1450, Ocean Blue Omega, [place] Professional Omega-3 2100, O[place]or, Omega-3, Ovega-3, Triklo What should I tell my care team before I take this medication? They need to know if you have any of these  conditions: Diabetes (high blood sugar) Irregular heartbeat or rhythm Liver disease Low thyroid levels Pancreatic disease An unusual or allergic reaction to fish oil, omega-3 fatty acids, fish, shellfish, other medications, foods, dyes, or preservatives Pregnant or trying to get pregnant Breast-feeding How should I use this medication? Take this medication by mouth. Take it as directed on the prescription label at the same time every day. Do not cut, crush or chew this medication. Swallow the capsules whole. Take it with food. Keep taking it unless your care team tells you to stop. Talk to your care team about the use of this medication in children. Special care may be needed. Overdosage: If you think you have taken too much of this medicine contact a poison control center or emergency room at once. NOTE: This medicine is only for you. Do not share this medicine with others. What if I miss a dose? If you miss a dose, take it as soon as you can. If it is almost time for your next dose, take only that dose. Do not take double or extra doses. What may interact with this medication? Aspirin and aspirin-like medications Herbal products like danshen, dong quai, garlic pills, ginger, ginkgo biloba, horse chestnut, willow bark, and others Medications that treat or prevent blood clots, such as enoxaparin, heparin, warfarin This list may not describe all possible interactions. Give your health care provider a list of all the medicines, herbs, non-prescription drugs, or dietary supplements you use. Also tell them if you smoke, drink alcohol, or use illegal drugs. Some items may interact with your medicine. What should  I watch for while using this medication? Visit your care team for regular checks on your progress. It may be some time before you see the benefit from this medication. This medication may increase your risk to bruise or bleed. Call your care team if you notice any unusual bleeding. You may  need blood work while taking this medication. Taking this medication is only part of a total heart healthy program. Ask your care team if there are other changes you can make to improve your overall health. What side effects may I notice from receiving this medication? Side effects that you should report to your care team as soon as possible: Allergic reactions--skin rash, itching, hives, swelling of the face, lips, tongue, or throat Side effects that usually do not require medical attention (report to your care team if they continue or are bothersome): Bad breath Burping Fishy aftertaste Heartburn Upset stomach This list may not describe all possible side effects. Call your doctor for medical advice about side effects. You may report side effects to FDA at 1-800-FDA-1088. Where should I keep my medication? Keep out of the reach of children and pets. Store at room temperature between 15 and 30 degrees C (59 and 86 degrees F). Do not freeze. Get rid of any unused medication after the expiration date. NOTE: This sheet is a summary. It may not cover all possible information. If you have questions about this medicine, talk to your doctor, pharmacist, or health care provider.  2023 Elsevier/Gold Standard (2022-07-23 00:00:00)

## 2023-03-03 DIAGNOSIS — M4807 Spinal stenosis, lumbosacral region: Secondary | ICD-10-CM | POA: Diagnosis not present

## 2023-03-03 DIAGNOSIS — M25651 Stiffness of right hip, not elsewhere classified: Secondary | ICD-10-CM | POA: Diagnosis not present

## 2023-03-03 DIAGNOSIS — R262 Difficulty in walking, not elsewhere classified: Secondary | ICD-10-CM | POA: Diagnosis not present

## 2023-03-03 DIAGNOSIS — M25652 Stiffness of left hip, not elsewhere classified: Secondary | ICD-10-CM | POA: Diagnosis not present

## 2023-03-05 DIAGNOSIS — M4807 Spinal stenosis, lumbosacral region: Secondary | ICD-10-CM | POA: Diagnosis not present

## 2023-03-05 DIAGNOSIS — M25652 Stiffness of left hip, not elsewhere classified: Secondary | ICD-10-CM | POA: Diagnosis not present

## 2023-03-05 DIAGNOSIS — M25651 Stiffness of right hip, not elsewhere classified: Secondary | ICD-10-CM | POA: Diagnosis not present

## 2023-03-05 DIAGNOSIS — R262 Difficulty in walking, not elsewhere classified: Secondary | ICD-10-CM | POA: Diagnosis not present

## 2023-03-09 DIAGNOSIS — H401131 Primary open-angle glaucoma, bilateral, mild stage: Secondary | ICD-10-CM | POA: Diagnosis not present

## 2023-03-10 DIAGNOSIS — M25651 Stiffness of right hip, not elsewhere classified: Secondary | ICD-10-CM | POA: Diagnosis not present

## 2023-03-10 DIAGNOSIS — M25652 Stiffness of left hip, not elsewhere classified: Secondary | ICD-10-CM | POA: Diagnosis not present

## 2023-03-10 DIAGNOSIS — M4807 Spinal stenosis, lumbosacral region: Secondary | ICD-10-CM | POA: Diagnosis not present

## 2023-03-10 DIAGNOSIS — R262 Difficulty in walking, not elsewhere classified: Secondary | ICD-10-CM | POA: Diagnosis not present

## 2023-03-12 DIAGNOSIS — M4807 Spinal stenosis, lumbosacral region: Secondary | ICD-10-CM | POA: Diagnosis not present

## 2023-03-12 DIAGNOSIS — M25652 Stiffness of left hip, not elsewhere classified: Secondary | ICD-10-CM | POA: Diagnosis not present

## 2023-03-12 DIAGNOSIS — M25651 Stiffness of right hip, not elsewhere classified: Secondary | ICD-10-CM | POA: Diagnosis not present

## 2023-03-12 DIAGNOSIS — R262 Difficulty in walking, not elsewhere classified: Secondary | ICD-10-CM | POA: Diagnosis not present

## 2023-03-17 DIAGNOSIS — M25651 Stiffness of right hip, not elsewhere classified: Secondary | ICD-10-CM | POA: Diagnosis not present

## 2023-03-17 DIAGNOSIS — M4807 Spinal stenosis, lumbosacral region: Secondary | ICD-10-CM | POA: Diagnosis not present

## 2023-03-17 DIAGNOSIS — R262 Difficulty in walking, not elsewhere classified: Secondary | ICD-10-CM | POA: Diagnosis not present

## 2023-03-17 DIAGNOSIS — M25652 Stiffness of left hip, not elsewhere classified: Secondary | ICD-10-CM | POA: Diagnosis not present

## 2023-03-19 DIAGNOSIS — M4807 Spinal stenosis, lumbosacral region: Secondary | ICD-10-CM | POA: Diagnosis not present

## 2023-03-19 DIAGNOSIS — R262 Difficulty in walking, not elsewhere classified: Secondary | ICD-10-CM | POA: Diagnosis not present

## 2023-03-19 DIAGNOSIS — M25651 Stiffness of right hip, not elsewhere classified: Secondary | ICD-10-CM | POA: Diagnosis not present

## 2023-03-19 DIAGNOSIS — M25652 Stiffness of left hip, not elsewhere classified: Secondary | ICD-10-CM | POA: Diagnosis not present

## 2023-03-30 DIAGNOSIS — G4733 Obstructive sleep apnea (adult) (pediatric): Secondary | ICD-10-CM | POA: Diagnosis not present

## 2023-03-31 DIAGNOSIS — R262 Difficulty in walking, not elsewhere classified: Secondary | ICD-10-CM | POA: Diagnosis not present

## 2023-03-31 DIAGNOSIS — M4807 Spinal stenosis, lumbosacral region: Secondary | ICD-10-CM | POA: Diagnosis not present

## 2023-03-31 DIAGNOSIS — M25652 Stiffness of left hip, not elsewhere classified: Secondary | ICD-10-CM | POA: Diagnosis not present

## 2023-03-31 DIAGNOSIS — M25651 Stiffness of right hip, not elsewhere classified: Secondary | ICD-10-CM | POA: Diagnosis not present

## 2023-04-02 DIAGNOSIS — M25651 Stiffness of right hip, not elsewhere classified: Secondary | ICD-10-CM | POA: Diagnosis not present

## 2023-04-02 DIAGNOSIS — M4807 Spinal stenosis, lumbosacral region: Secondary | ICD-10-CM | POA: Diagnosis not present

## 2023-04-02 DIAGNOSIS — R262 Difficulty in walking, not elsewhere classified: Secondary | ICD-10-CM | POA: Diagnosis not present

## 2023-04-02 DIAGNOSIS — M25652 Stiffness of left hip, not elsewhere classified: Secondary | ICD-10-CM | POA: Diagnosis not present

## 2023-04-06 DIAGNOSIS — H524 Presbyopia: Secondary | ICD-10-CM | POA: Diagnosis not present

## 2023-04-06 DIAGNOSIS — H401131 Primary open-angle glaucoma, bilateral, mild stage: Secondary | ICD-10-CM | POA: Diagnosis not present

## 2023-04-14 DIAGNOSIS — M25652 Stiffness of left hip, not elsewhere classified: Secondary | ICD-10-CM | POA: Diagnosis not present

## 2023-04-14 DIAGNOSIS — M4807 Spinal stenosis, lumbosacral region: Secondary | ICD-10-CM | POA: Diagnosis not present

## 2023-04-14 DIAGNOSIS — R262 Difficulty in walking, not elsewhere classified: Secondary | ICD-10-CM | POA: Diagnosis not present

## 2023-04-14 DIAGNOSIS — M25651 Stiffness of right hip, not elsewhere classified: Secondary | ICD-10-CM | POA: Diagnosis not present

## 2023-04-16 DIAGNOSIS — M25651 Stiffness of right hip, not elsewhere classified: Secondary | ICD-10-CM | POA: Diagnosis not present

## 2023-04-16 DIAGNOSIS — M25652 Stiffness of left hip, not elsewhere classified: Secondary | ICD-10-CM | POA: Diagnosis not present

## 2023-04-16 DIAGNOSIS — M4807 Spinal stenosis, lumbosacral region: Secondary | ICD-10-CM | POA: Diagnosis not present

## 2023-04-16 DIAGNOSIS — R262 Difficulty in walking, not elsewhere classified: Secondary | ICD-10-CM | POA: Diagnosis not present

## 2023-04-21 DIAGNOSIS — M4807 Spinal stenosis, lumbosacral region: Secondary | ICD-10-CM | POA: Diagnosis not present

## 2023-04-21 DIAGNOSIS — R262 Difficulty in walking, not elsewhere classified: Secondary | ICD-10-CM | POA: Diagnosis not present

## 2023-04-21 DIAGNOSIS — M25652 Stiffness of left hip, not elsewhere classified: Secondary | ICD-10-CM | POA: Diagnosis not present

## 2023-04-21 DIAGNOSIS — M25651 Stiffness of right hip, not elsewhere classified: Secondary | ICD-10-CM | POA: Diagnosis not present

## 2023-04-23 DIAGNOSIS — M25651 Stiffness of right hip, not elsewhere classified: Secondary | ICD-10-CM | POA: Diagnosis not present

## 2023-04-23 DIAGNOSIS — M25652 Stiffness of left hip, not elsewhere classified: Secondary | ICD-10-CM | POA: Diagnosis not present

## 2023-04-23 DIAGNOSIS — R262 Difficulty in walking, not elsewhere classified: Secondary | ICD-10-CM | POA: Diagnosis not present

## 2023-04-23 DIAGNOSIS — M4807 Spinal stenosis, lumbosacral region: Secondary | ICD-10-CM | POA: Diagnosis not present

## 2023-05-01 DIAGNOSIS — G4733 Obstructive sleep apnea (adult) (pediatric): Secondary | ICD-10-CM | POA: Diagnosis not present

## 2023-05-01 DIAGNOSIS — M25651 Stiffness of right hip, not elsewhere classified: Secondary | ICD-10-CM | POA: Diagnosis not present

## 2023-05-01 DIAGNOSIS — M25652 Stiffness of left hip, not elsewhere classified: Secondary | ICD-10-CM | POA: Diagnosis not present

## 2023-05-01 DIAGNOSIS — R262 Difficulty in walking, not elsewhere classified: Secondary | ICD-10-CM | POA: Diagnosis not present

## 2023-05-01 DIAGNOSIS — M4807 Spinal stenosis, lumbosacral region: Secondary | ICD-10-CM | POA: Diagnosis not present

## 2023-05-05 DIAGNOSIS — M25651 Stiffness of right hip, not elsewhere classified: Secondary | ICD-10-CM | POA: Diagnosis not present

## 2023-05-05 DIAGNOSIS — M25652 Stiffness of left hip, not elsewhere classified: Secondary | ICD-10-CM | POA: Diagnosis not present

## 2023-05-05 DIAGNOSIS — R262 Difficulty in walking, not elsewhere classified: Secondary | ICD-10-CM | POA: Diagnosis not present

## 2023-05-05 DIAGNOSIS — M4807 Spinal stenosis, lumbosacral region: Secondary | ICD-10-CM | POA: Diagnosis not present

## 2023-05-07 DIAGNOSIS — M25651 Stiffness of right hip, not elsewhere classified: Secondary | ICD-10-CM | POA: Diagnosis not present

## 2023-05-07 DIAGNOSIS — M4807 Spinal stenosis, lumbosacral region: Secondary | ICD-10-CM | POA: Diagnosis not present

## 2023-05-07 DIAGNOSIS — R262 Difficulty in walking, not elsewhere classified: Secondary | ICD-10-CM | POA: Diagnosis not present

## 2023-05-07 DIAGNOSIS — M25652 Stiffness of left hip, not elsewhere classified: Secondary | ICD-10-CM | POA: Diagnosis not present

## 2023-05-12 DIAGNOSIS — M25652 Stiffness of left hip, not elsewhere classified: Secondary | ICD-10-CM | POA: Diagnosis not present

## 2023-05-12 DIAGNOSIS — R262 Difficulty in walking, not elsewhere classified: Secondary | ICD-10-CM | POA: Diagnosis not present

## 2023-05-12 DIAGNOSIS — M4807 Spinal stenosis, lumbosacral region: Secondary | ICD-10-CM | POA: Diagnosis not present

## 2023-05-12 DIAGNOSIS — M25651 Stiffness of right hip, not elsewhere classified: Secondary | ICD-10-CM | POA: Diagnosis not present

## 2023-05-14 DIAGNOSIS — M25651 Stiffness of right hip, not elsewhere classified: Secondary | ICD-10-CM | POA: Diagnosis not present

## 2023-05-14 DIAGNOSIS — M25652 Stiffness of left hip, not elsewhere classified: Secondary | ICD-10-CM | POA: Diagnosis not present

## 2023-05-14 DIAGNOSIS — R262 Difficulty in walking, not elsewhere classified: Secondary | ICD-10-CM | POA: Diagnosis not present

## 2023-05-14 DIAGNOSIS — M4807 Spinal stenosis, lumbosacral region: Secondary | ICD-10-CM | POA: Diagnosis not present

## 2023-05-19 DIAGNOSIS — M4807 Spinal stenosis, lumbosacral region: Secondary | ICD-10-CM | POA: Diagnosis not present

## 2023-05-19 DIAGNOSIS — M25652 Stiffness of left hip, not elsewhere classified: Secondary | ICD-10-CM | POA: Diagnosis not present

## 2023-05-19 DIAGNOSIS — M25651 Stiffness of right hip, not elsewhere classified: Secondary | ICD-10-CM | POA: Diagnosis not present

## 2023-05-19 DIAGNOSIS — R262 Difficulty in walking, not elsewhere classified: Secondary | ICD-10-CM | POA: Diagnosis not present

## 2023-05-21 DIAGNOSIS — R262 Difficulty in walking, not elsewhere classified: Secondary | ICD-10-CM | POA: Diagnosis not present

## 2023-05-21 DIAGNOSIS — M25652 Stiffness of left hip, not elsewhere classified: Secondary | ICD-10-CM | POA: Diagnosis not present

## 2023-05-21 DIAGNOSIS — M25651 Stiffness of right hip, not elsewhere classified: Secondary | ICD-10-CM | POA: Diagnosis not present

## 2023-05-21 DIAGNOSIS — M4807 Spinal stenosis, lumbosacral region: Secondary | ICD-10-CM | POA: Diagnosis not present

## 2023-08-06 ENCOUNTER — Other Ambulatory Visit: Payer: Medicare Other

## 2023-09-08 ENCOUNTER — Other Ambulatory Visit: Payer: Self-pay | Admitting: Internal Medicine

## 2023-09-08 ENCOUNTER — Ambulatory Visit
Admission: RE | Admit: 2023-09-08 | Discharge: 2023-09-08 | Disposition: A | Discharge status: Home / Self Care | Payer: Medicare Other | Source: Ambulatory Visit | Attending: Internal Medicine | Admitting: Internal Medicine

## 2023-09-08 DIAGNOSIS — I10 Essential (primary) hypertension: Secondary | ICD-10-CM | POA: Diagnosis not present

## 2023-09-08 DIAGNOSIS — R0781 Pleurodynia: Secondary | ICD-10-CM

## 2023-09-08 DIAGNOSIS — R079 Chest pain, unspecified: Secondary | ICD-10-CM | POA: Diagnosis not present

## 2023-09-08 DIAGNOSIS — J439 Emphysema, unspecified: Secondary | ICD-10-CM | POA: Diagnosis not present

## 2023-09-08 DIAGNOSIS — I7121 Aneurysm of the ascending aorta, without rupture: Secondary | ICD-10-CM | POA: Diagnosis not present

## 2023-09-08 DIAGNOSIS — E7800 Pure hypercholesterolemia, unspecified: Secondary | ICD-10-CM | POA: Diagnosis not present

## 2023-09-08 DIAGNOSIS — S2241XA Multiple fractures of ribs, right side, initial encounter for closed fracture: Secondary | ICD-10-CM | POA: Diagnosis not present

## 2023-09-10 ENCOUNTER — Other Ambulatory Visit: Payer: Self-pay | Admitting: *Deleted

## 2023-09-10 DIAGNOSIS — E782 Mixed hyperlipidemia: Secondary | ICD-10-CM

## 2023-09-17 DIAGNOSIS — H401131 Primary open-angle glaucoma, bilateral, mild stage: Secondary | ICD-10-CM | POA: Diagnosis not present

## 2023-09-17 DIAGNOSIS — Z961 Presence of intraocular lens: Secondary | ICD-10-CM | POA: Diagnosis not present

## 2023-10-06 ENCOUNTER — Ambulatory Visit: Payer: Medicare Other | Attending: Internal Medicine | Admitting: Pharmacist

## 2023-10-06 ENCOUNTER — Telehealth: Payer: Self-pay | Admitting: Pharmacy Technician

## 2023-10-06 ENCOUNTER — Other Ambulatory Visit (HOSPITAL_COMMUNITY): Payer: Self-pay

## 2023-10-06 ENCOUNTER — Encounter: Payer: Self-pay | Admitting: Pharmacist

## 2023-10-06 DIAGNOSIS — E782 Mixed hyperlipidemia: Secondary | ICD-10-CM | POA: Diagnosis not present

## 2023-10-06 DIAGNOSIS — G3184 Mild cognitive impairment, so stated: Secondary | ICD-10-CM | POA: Diagnosis not present

## 2023-10-06 DIAGNOSIS — Z23 Encounter for immunization: Secondary | ICD-10-CM | POA: Diagnosis not present

## 2023-10-06 NOTE — Assessment & Plan Note (Signed)
Assessment: Patient compliant with medications Diet is ideal Will soon be resuming daily exercise Discussed PCSK9 Intolerant to Lipitor, Crestor, simvastatin and pravastatin (severe muscle pain)  Plan: Will submit prior authorization for Repatha Once I hear back and no cost will contact patient via MyChart or call son (patient cannot hear on the phone) Repeat labs in 2 to 3 months

## 2023-10-06 NOTE — Progress Notes (Signed)
Patient ID: Rachel Gardner                 DOB: 1946/10/07                    MRN: [id number]      HPI: Rachel Gardner is a 77 y.o. female patient referred to lipid clinic by Dr. Anne Fu. PMH is significant for coronary artery calcification, dilated aortic root, aortic atherosclerosis, thoracic ascending aortic aneurysm, mixed hyperlipidemia. She was started on Lovaza in March. TG improved from 376-171 but LDL-C increased to 94-149. Some of this discrepancy is probably from calculation error when TG were high. Extensive coronary calcification also seen on CT.  Patient presents today to lipid clinic.  She is a retired Char[employer] fundraiser.  Originally from [place].  Motivated to get her cholesterol down.  She is very disciplined and organized.  Had significant muscle pains to statins in the past.    Reviewed options for lowering LDL cholesterol- PCSK-9 inhibitors.   Discussed dosing, side effects and potential decreases in LDL cholesterol.  Patient understands co-pay cost might be higher.   Current Medications: lovaza 2g BID Intolerances: Lipitor, crestor, pravastatin, zocor,  - muscle pain  Risk Factors: thoracic ascending aortic aneurysm, CAD, HTN LDL-C goal: <70 ApoB goal: <80  Diet: No sugar, no bread Chicken fish and shrimp Vegetables with all 3 meals eggs twice a week Black coffee, herbal teas No fried foods  Exercise: Used to walk up to  5 miles a day.  Her dog passed away and she had a fall where she cracked some ribs.  Just got a new dog and plans to walk him twice a day starting in November  Family History:  Family History  Problem Relation Age of Onset   Hypertension Mother    Hypertension Father    Hypertension Sister    Thyroid disease Sister    Colon cancer Maternal Aunt    Prostate cancer Paternal Uncle    Colon cancer Cousin        x 2; paternal   Prostate cancer Brother    Hyperlipidemia Brother    Colon polyps Neg Hx    Esophageal cancer Neg Hx    Stomach cancer Neg Hx     Rectal cancer Neg Hx    Breast cancer Neg Hx      Social History:  Social History   Socioeconomic History   Marital status: Divorced    Spouse name: Not on file   Number of children: 3   Years of education: Not on file   Highest education level: Not on file  Occupational History   Occupation: retired Engineer, civil (consulting)  Tobacco Use   Smoking status: Former    Current packs/day: 0.00    Average packs/day: 0.5 packs/day for 41.0 years (20.5 ttl pk-yrs)    Types: Cigarettes    Start date: 07/26/1980    Quit date: 07/26/2021    Years since quitting: 2.1   Smokeless tobacco: Never   Tobacco comments:    Quit 4 months ago, 1/2 pack a day MRC 11/01/21  Vaping Use   Vaping status: Never Used  Substance and Sexual Activity   Alcohol use: Not Currently    Comment: very rare   Drug use: Never   Sexual activity: Not on file  Other Topics Concern   Not on file  Social History Narrative   Regular exercise: walk; 1 mile a day / walk 5 miles weekend;    update  02/22/2020 not exercising   Caffeine use: 1-2 cups of coffee per day   3 children   Lives alone with her dogs, son lives next door   Social Determinants of Health   Financial Resource Strain: Not on file  Food Insecurity: Not on file  Transportation Needs: Not on file  Physical Activity: Not on file  Stress: Not on file  Social Connections: Not on file  Intimate Partner Violence: Not on file     Labs:  09/08/23: TC 225, TG 171, HDL 45, LDL-C 149 TC d Panel     Component Value Date/Time   CHOL 216 (H) 12/17/2020 0830   CHOL 222 (H) 12/16/2016 0826   TRIG 191.0 (H) 12/17/2020 0830   HDL 39.00 (L) 12/17/2020 0830   HDL 48 12/16/2016 0826   CHOLHDL 6 12/17/2020 0830   VLDL 38.2 12/17/2020 0830   LDLCALC 138 (H) 12/17/2020 0830   LDLCALC 138 (H) 12/16/2016 0826   LDLDIRECT 166.0 02/07/2019 0907   LABVLDL 36 12/16/2016 0826    Past Medical History:  Diagnosis Date   BPPV (benign paroxysmal positional vertigo)     neurologist--- dr Lucia Gaskins   Cochlear implant in place    bilateral   Coronary artery calcification    cardiologist--- dr Anne Fu   DDD (degenerative disc disease), lumbosacral    Fecal incontinence    intermittant , wears depends   Genital herpes    GERD (gastroesophageal reflux disease)    Glaucoma, both eyes    Hiatal hernia    History of adenomatous polyp of colon    History of diverticulitis of colon    w/ hx sugical intervention hemicolectomy in 2012   History of Helicobacter pylori infection    History of kidney stones    Hyperlipidemia, mixed    Hypertension    Hypothyroidism, postsurgical    endocrinologist-- dr Everardo All;   s/p total thyroidectomy for goiter, per dr Everardo All note benign   Incontinence of urine in female    wears depends   Lung nodule seen on imaging study    followed by dr Marchelle Gearing;  per lov note chronic stable, LLL   Lymphadenopathy    followed by pcp;  evaluated by oncologist-- dr Lenis Dickinson note 08-22-2019 in epic   Meniere's disease    OAB (overactive bladder)    OSA on CPAP    followed by dr t. Mayford Knife;   sleep study in epic 10-22-2018  moderate osa   Pulmonary emphysema (HCC)    pulmonology--- dr Lavinia Sharps   Sensorineural hearing loss (SNHL) of both ears    s/p cochlear implant's   SOB (shortness of breath)    Thoracic ascending aortic aneurysm (HCC)    followed by dr Laneta Simmers--- 4.1 cm per 01-21-2022 chest ct   Wears glasses    Wears partial dentures upper and lower     Current Outpatient Medications on File Prior to Visit  Medication Sig Dispense Refill   ascorbic acid (VITAMIN C) 1000 MG tablet Take 500 mg by mouth daily.      aspirin 81 MG chewable tablet Chew 81 mg by mouth daily.      bimatoprost (LUMIGAN) 0.01 % SOLN Place 1 drop into both eyes at bedtime.     CALCIUM PO Take 1 tablet by mouth daily.     Cholecalciferol (VITAMIN D3 PO) Take 1 tablet by mouth daily.     Cyanocobalamin (VITAMIN B-12 PO) Take 1 tablet by mouth daily.      fluticasone (  FLONASE) 50 MCG/ACT nasal spray Place 2 sprays into both nostrils at bedtime.     hydrochlorothiazide (HYDRODIURIL) 25 MG tablet Take 1 tablet (25 mg total) by mouth daily. 90 tablet 3   ibuprofen (ADVIL) 600 MG tablet Take 1 tablet (600 mg total) by mouth every 6 (six) hours as needed. 30 tablet 0   levothyroxine (SYNTHROID) 112 MCG tablet Take 1 tablet (112 mcg total) by mouth daily. 90 tablet 3   losartan (COZAAR) 100 MG tablet TAKE 1 TABLET(100 MG) BY MOUTH DAILY 90 tablet 3   meclizine (ANTIVERT) 25 MG tablet Take 1 tablet (25 mg total) by mouth 3 (three) times daily as needed for dizziness. 90 tablet 9   omega-3 acid ethyl esters (LOVAZA) 1 g capsule Take 2 capsules (2 g total) by mouth 2 (two) times daily. 360 capsule 3   Probiotic Product (PROBIOTIC ADVANCED PO) Take 1 capsule by mouth daily.     Tiotropium Bromide Monohydrate (SPIRIVA RESPIMAT) 2.5 MCG/ACT AERS Inhale 2 puffs into the lungs daily. 4 g 0   No current facility-administered medications on file prior to visit.    Allergies  Allergen Reactions   Morphine Nausea And Vomiting   Morphine And Codeine     Nausea Can tolerate Dilaudid   Statins Other (See Comments)    Joint pain    Tape Other (See Comments)    Causes redness and will take skin when removed   Wellbutrin [Bupropion] Other (See Comments)    Suicidal intensions   Denture Adhesive Rash    Assessment/Plan:  1. Hyperlipidemia -  Mixed hyperlipidemia Assessment: Patient compliant with medications Diet is ideal Will soon be resuming daily exercise Discussed PCSK9 Intolerant to Lipitor, Crestor, simvastatin and pravastatin (severe muscle pain)  Plan: Will submit prior authorization for Repatha Once I hear back and no cost will contact patient via MyChart or call son (patient cannot hear on the phone) Repeat labs in 2 to 3 months    Thank you,  Olene Floss, Pharm.D, BCACP, BCPS, CPP [facility] [address], [place], [place] [address]  Phone: [phone]; Fax: [phone]

## 2023-10-06 NOTE — Telephone Encounter (Signed)
Pharmacy Patient Advocate Encounter   Received notification from Pt Calls Messages that prior authorization for repatha is required/requested.   Insurance verification completed.   The patient is insured through [facility] .   Per test claim: PA required; PA started via CoverMyMeds. KEY [id number] . Waiting for clinical questions to populate.

## 2023-10-06 NOTE — Telephone Encounter (Signed)
-----   Message from Olene Floss sent at 10/06/2023 11:32 AM EDT ----- Please submit PA for Repatha. ASCVD-  thoracic ascending aortic aneurysm- intolerant to statins

## 2023-10-06 NOTE — Patient Instructions (Signed)
I will submit a prior authorization for Repatha. I will call you once I hear back. Please call me at [phone] with any questions.   Repatha is a cholesterol medication that improved your body's ability to get rid of "bad cholesterol" known as LDL. It can lower your LDL up to 60%! It is an injection that is given under the skin every 2 weeks. The medication often requires a prior authorization from your insurance company. We will take care of submitting all the necessary information to your insurance company to get it approved. The most common side effects of Repatha include runny nose, symptoms of the common cold, rarely flu or flu-like symptoms, back/muscle pain in about 3-4% of the patients, and redness, pain, or bruising at the injection site. Tell your healthcare provider if you have any side effect that bothers you or that does not go away.     

## 2023-10-07 ENCOUNTER — Other Ambulatory Visit (HOSPITAL_COMMUNITY): Payer: Self-pay

## 2023-10-07 ENCOUNTER — Encounter: Payer: Self-pay | Admitting: Pharmacist

## 2023-10-07 DIAGNOSIS — E782 Mixed hyperlipidemia: Secondary | ICD-10-CM

## 2023-10-07 MED ORDER — REPATHA SURECLICK 140 MG/ML ~~LOC~~ SOAJ: 1.0000 mL | SUBCUTANEOUS | 11 refills | Status: DC

## 2023-10-07 NOTE — Telephone Encounter (Signed)
Called and LVM with patient's son per pt request. I will also send mychart message.

## 2023-10-07 NOTE — Addendum Note (Signed)
Addended by: Malena Peer D on: 10/07/2023 01:12 PM   Modules accepted: Orders

## 2023-10-07 NOTE — Telephone Encounter (Signed)
Pharmacy Patient Advocate Encounter  Received notification from [facility] that Prior Authorization for repatha has been APPROVED from 10/07/23 to 10/05/24. Ran test claim, Copay is $45.00. This test claim was processed through [facility]- copay amounts may vary at other pharmacies due to pharmacy/plan contracts, or as the patient moves through the different stages of their insurance plan.   PA #/Case ID/Reference #: [id number]

## 2023-10-28 DIAGNOSIS — H903 Sensorineural hearing loss, bilateral: Secondary | ICD-10-CM | POA: Diagnosis not present

## 2023-10-30 ENCOUNTER — Ambulatory Visit (HOSPITAL_COMMUNITY): Payer: Medicare Other | Attending: Cardiology

## 2023-10-30 DIAGNOSIS — G4730 Sleep apnea, unspecified: Secondary | ICD-10-CM | POA: Insufficient documentation

## 2023-10-30 DIAGNOSIS — E785 Hyperlipidemia, unspecified: Secondary | ICD-10-CM | POA: Insufficient documentation

## 2023-10-30 DIAGNOSIS — I2510 Atherosclerotic heart disease of native coronary artery without angina pectoris: Secondary | ICD-10-CM

## 2023-10-30 DIAGNOSIS — I10 Essential (primary) hypertension: Secondary | ICD-10-CM | POA: Diagnosis not present

## 2023-10-30 DIAGNOSIS — R0602 Shortness of breath: Secondary | ICD-10-CM | POA: Diagnosis not present

## 2023-10-30 DIAGNOSIS — I7121 Aneurysm of the ascending aorta, without rupture: Secondary | ICD-10-CM | POA: Insufficient documentation

## 2023-10-30 DIAGNOSIS — I77810 Thoracic aortic ectasia: Secondary | ICD-10-CM

## 2023-10-30 LAB — ECHOCARDIOGRAM COMPLETE
Area-P 1/2: 1.76 cm2
Calc EF: 68.7 %
Est EF: >75
P 1/2 time: 660 ms
S' Lateral: 2 cm
Single Plane A2C EF: 68.3 %
Single Plane A4C EF: 68.8 %

## 2023-11-02 ENCOUNTER — Encounter: Payer: Self-pay | Admitting: Surgery

## 2023-11-02 NOTE — Addendum Note (Signed)
Addended by: Malena Peer D on: 11/02/2023 11:26 AM   Modules accepted: Orders

## 2023-11-12 ENCOUNTER — Other Ambulatory Visit (HOSPITAL_COMMUNITY): Payer: Self-pay

## 2023-11-12 ENCOUNTER — Ambulatory Visit: Payer: Medicare Other | Attending: Cardiovascular Disease | Admitting: Pharmacist

## 2023-11-12 ENCOUNTER — Telehealth: Payer: Self-pay | Admitting: Pharmacy Technician

## 2023-11-12 DIAGNOSIS — I2510 Atherosclerotic heart disease of native coronary artery without angina pectoris: Secondary | ICD-10-CM

## 2023-11-12 DIAGNOSIS — I700 Atherosclerosis of aorta: Secondary | ICD-10-CM

## 2023-11-12 DIAGNOSIS — E782 Mixed hyperlipidemia: Secondary | ICD-10-CM | POA: Diagnosis not present

## 2023-11-12 NOTE — Progress Notes (Signed)
Patient ID: Rachel Gardner                 DOB: 24-Jul-1946                    MRN: [id number]      HPI: Rachel Gardner is a 77 y.o. female patient referred to lipid clinic by Dr. Anne Fu. PMH is significant for coronary artery calcification, dilated aortic root, aortic atherosclerosis, thoracic ascending aortic aneurysm, mixed hyperlipidemia, OSA on CPAP, COPD, hearing loss. She was started on Lovaza in March. TG improved from 376>171 but LDL-C increased to 94>149. Some of this discrepancy is probably from calculation error when TG were high. Extensive coronary calcification seen on CT.  Patient initially presented to lipid clinic on 10/06/23. 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 Motivated to ger her cholesterol down. She is very disciplined and organized. Had significant muscle pains to statins in the past. She was started on Repatha. However, patient messaged after 1 dose that she started experiencing the same symptoms she had with statins (body aches, lack of energy, needing a rest in the day). She took her second shot and symptoms became worse. She was checking her vitals at home - which were stable. Denied any changes in diet or stress. She was rescheduled to discuss additional options. Pt had a repeat echo on 11/1 - heart fxn normal, ascending aortic aneurysm stable.   Today she is doing well. Discussed initiating inclisiran Wilber Bihari), though pt is not interested in receiving injection at an infusion clinic. Also discussed initiating Nexlizet - though this may not be as effective as a PCSK9i. Pt was interested in re-trialing mAb PCSK9i Praluent (alirocumab) at the lower dosage 75 mg subcutaneous weekly. 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 Agreed that it would be OK for her to delay initiation until after the wedding if she  desires. Noted that patient took ezetimibe (from ~2017-2020)- pt does not recall why this medication was discontinued, may have been ineffective.   Current Medications: lovaza 2g BID Intolerances: Lipitor, crestor, pravastatin, zocor,  - muscle pain; Repatha - muscle pain (similar to statins)  Risk Factors: thoracic ascending aortic aneurysm, CAD, HTN LDL-C goal: <70 ApoB goal: <80  Diet: No sugar, no bread Chicken fish and shrimp Vegetables with all 3 meals eggs twice a week Black coffee, herbal teas No fried foods  Exercise: Used to walk up to  5 miles a day.  Her dog passed away and she had a fall where she cracked some ribs.  Just got a new dog and plans to walk him twice a day. She has been staying active in her yard, and making dresses.   Family History:  Family History  Problem Relation Age of Onset   Hypertension Mother    Hypertension Father    Hypertension Sister    Thyroid disease Sister    Colon cancer Maternal Aunt    Prostate cancer Paternal Uncle    Colon cancer Cousin        x 2; paternal   Prostate cancer Brother    Hyperlipidemia Brother    Colon polyps Neg Hx    Esophageal cancer Neg Hx    Stomach cancer Neg Hx    Rectal cancer Neg Hx    Breast cancer Neg Hx  Social History:  Social History   Socioeconomic History   Marital status: Divorced    Spouse name: Not on file   Number of children: 3   Years of education: Not on file   Highest education level: Not on file  Occupational History   Occupation: retired Engineer, civil (consulting)  Tobacco Use   Smoking status: Former    Current packs/day: 0.00    Average packs/day: 0.5 packs/day for 41.0 years (20.5 ttl pk-yrs)    Types: Cigarettes    Start date: 07/26/1980    Quit date: 07/26/2021    Years since quitting: 2.2   Smokeless tobacco: Never   Tobacco comments:    Quit 4 months ago, 1/2 pack a day MRC 11/01/21  Vaping Use   Vaping status: Never Used  Substance and Sexual Activity   Alcohol use: Not  Currently    Comment: very rare   Drug use: Never   Sexual activity: Not on file  Other Topics Concern   Not on file  Social History Narrative   Regular exercise: walk; 1 mile a day / walk 5 miles weekend;    update 02/22/2020 not exercising   Caffeine use: 1-2 cups of coffee per day   3 children   Lives alone with her dogs, son lives next door   Social Determinants of Health   Financial Resource Strain: Not on file  Food Insecurity: Not on file  Transportation Needs: Not on file  Physical Activity: Not on file  Stress: Not on file  Social Connections: Not on file  Intimate Partner Violence: Not on file     Labs:  09/08/23: TC 225, TG 171, HDL 45, LDL-C 149 TC d Panel     Component Value Date/Time   CHOL 216 (H) 12/17/2020 0830   CHOL 222 (H) 12/16/2016 0826   TRIG 191.0 (H) 12/17/2020 0830   HDL 39.00 (L) 12/17/2020 0830   HDL 48 12/16/2016 0826   CHOLHDL 6 12/17/2020 0830   VLDL 38.2 12/17/2020 0830   LDLCALC 138 (H) 12/17/2020 0830   LDLCALC 138 (H) 12/16/2016 0826   LDLDIRECT 166.0 02/07/2019 0907   LABVLDL 36 12/16/2016 0826    Past Medical History:  Diagnosis Date   BPPV (benign paroxysmal positional vertigo)    neurologist--- dr Lucia Gaskins   Cochlear implant in place    bilateral   Coronary artery calcification    cardiologist--- dr Anne Fu   DDD (degenerative disc disease), lumbosacral    Fecal incontinence    intermittant , wears depends   Genital herpes    GERD (gastroesophageal reflux disease)    Glaucoma, both eyes    Hiatal hernia    History of adenomatous polyp of colon    History of diverticulitis of colon    w/ hx sugical intervention hemicolectomy in 2012   History of Helicobacter pylori infection    History of kidney stones    Hyperlipidemia, mixed    Hypertension    Hypothyroidism, postsurgical    endocrinologist-- dr Everardo All;   s/p total thyroidectomy for goiter, per dr Everardo All note benign   Incontinence of urine in female    wears  depends   Lung nodule seen on imaging study    followed by dr Marchelle Gearing;  per lov note chronic stable, LLL   Lymphadenopathy    followed by pcp;  evaluated by oncologist-- dr Lenis Dickinson note 08-22-2019 in epic   Meniere's disease    OAB (overactive bladder)    OSA on CPAP  followed by dr t. Mayford Knife;   sleep study in epic 10-22-2018  moderate osa   Pulmonary emphysema [facility])    pulmonology--- dr Judie Petit. Marchelle Gearing   Sensorineural hearing loss (SNHL) of both ears    s/p cochlear implant's   SOB (shortness of breath)    Thoracic ascending aortic aneurysm (HCC)    followed by dr Laneta Simmers--- 4.1 cm per 01-21-2022 chest ct   Wears glasses    Wears partial dentures upper and lower     Current Outpatient Medications on File Prior to Visit  Medication Sig Dispense Refill   ascorbic acid (VITAMIN C) 1000 MG tablet Take 500 mg by mouth daily.      aspirin 81 MG chewable tablet Chew 81 mg by mouth daily.      bimatoprost (LUMIGAN) 0.01 % SOLN Place 1 drop into both eyes at bedtime.     CALCIUM PO Take 1 tablet by mouth daily.     Cholecalciferol (VITAMIN D3 PO) Take 1 tablet by mouth daily.     Cyanocobalamin (VITAMIN B-12 PO) Take 1 tablet by mouth daily.     fluticasone (FLONASE) 50 MCG/ACT nasal spray Place 2 sprays into both nostrils at bedtime.     hydrochlorothiazide (HYDRODIURIL) 25 MG tablet Take 1 tablet (25 mg total) by mouth daily. 90 tablet 3   ibuprofen (ADVIL) 600 MG tablet Take 1 tablet (600 mg total) by mouth every 6 (six) hours as needed. 30 tablet 0   levothyroxine (SYNTHROID) 112 MCG tablet Take 1 tablet (112 mcg total) by mouth daily. 90 tablet 3   losartan (COZAAR) 100 MG tablet TAKE 1 TABLET(100 MG) BY MOUTH DAILY 90 tablet 3   meclizine (ANTIVERT) 25 MG tablet Take 1 tablet (25 mg total) by mouth 3 (three) times daily as needed for dizziness. 90 tablet 9   omega-3 acid ethyl esters (LOVAZA) 1 g capsule Take 2 capsules (2 g total) by mouth 2 (two) times daily. 360 capsule 3    Probiotic Product (PROBIOTIC ADVANCED PO) Take 1 capsule by mouth daily.     Tiotropium Bromide Monohydrate (SPIRIVA RESPIMAT) 2.5 MCG/ACT AERS Inhale 2 puffs into the lungs daily. 4 g 0   No current facility-administered medications on file prior to visit.    Allergies  Allergen Reactions   Morphine Nausea And Vomiting   Morphine And Codeine     Nausea Can tolerate Dilaudid   Repatha [Evolocumab] Other (See Comments)    Body aches   Statins Other (See Comments)    Joint pain    Tape Other (See Comments)    Causes redness and will take skin when removed   Wellbutrin [Bupropion] Other (See Comments)    Suicidal intensions   Denture Adhesive Rash    Assessment/Plan:  1. Hyperlipidemia -  Mixed hyperlipidemia Assessment - LDL-C of 149 mg/dL above goal <56 mg/dL given extensive coronary calcifications and stable aortic aneurysm - Pt unable to tolerate Repatha, with similar symptoms she experienced while taking statins - Pt may have better tolerability with inclisiran (Leqvio) or Nexlizet (bempedoic acid/ezetimibe), however pt preferred to retrial mAb PCSK9i with different agent, Praluent (alirocumab) at the lowest available dose of 75 mg subcutaneous every 2 weeks.  - Pt may decide to delay initiation until after family events in case the new medication induces AE, fatigue - If Praluent is not approved by insurance or becomes intolerable, can pursue Nexlizet. Uric acid 2014 wnl, and no hx of gout recorded in EMR.   Plan - Stop  Repatha - Will submit prior authorization for Praluent 75 mg subcutaneous every other week. If approved will contact patient via MyChart message or her son via telephone with coverage information.  - Obtain repeat lipid panel 2-3 months after starting new medication  Thank you,  Nils Pyle, PharmD PGY1 Pharmacy Resident  Olene Floss, Pharm.D, BCACP, BCPS, CPP [facility] [address],  [place], [place] [address]  Phone: [phone]; Fax: [phone]

## 2023-11-12 NOTE — Telephone Encounter (Signed)
Pharmacy Patient Advocate Encounter   Received notification from Pt Calls Messages that prior authorization for praluent is required/requested.   Insurance verification completed.   The patient is insured through [facility] .   Per test claim: PA required; PA submitted to above mentioned insurance via CoverMyMeds Key/confirmation #/EOC [facility] Status is pending

## 2023-11-12 NOTE — Assessment & Plan Note (Addendum)
Assessment - LDL-C of 149 mg/dL above goal <78 mg/dL given extensive coronary calcifications and stable aortic aneurysm - Pt unable to tolerate Repatha, with similar symptoms she experienced while taking statins - Pt may have better tolerability with inclisiran (Leqvio) or Nexlizet (bempedoic acid/ezetimibe), however pt preferred to retrial mAb PCSK9i with different agent, Praluent (alirocumab) at the lowest available dose of 75 mg subcutaneous every 2 weeks.  - Pt may decide to delay initiation until after family events in case the new medication induces AE, fatigue - If Praluent is not approved by insurance or becomes intolerable, can pursue Nexlizet. Uric acid 2014 wnl, and no hx of gout recorded in EMR.   Plan - Stop Repatha - Will submit prior authorization for Praluent 75 mg subcutaneous every other week. If approved will contact patient via MyChart message or her son via telephone with coverage information.  - Obtain repeat lipid panel 2-3 months after starting new medication

## 2023-11-12 NOTE — Telephone Encounter (Signed)
-----   Message from Particia Lather sent at 11/12/2023 12:32 PM EST ----- Please initiate PA for Praluent 75 mg subcutaneous every 2 weeks (lower dose). Thank you.   Nils Pyle, PharmD PGY1 Pharmacy Resident

## 2023-11-13 ENCOUNTER — Other Ambulatory Visit (HOSPITAL_COMMUNITY): Payer: Self-pay

## 2023-11-13 ENCOUNTER — Telehealth: Payer: Self-pay

## 2023-11-13 DIAGNOSIS — E782 Mixed hyperlipidemia: Secondary | ICD-10-CM

## 2023-11-13 MED ORDER — PRALUENT 75 MG/ML ~~LOC~~ SOAJ: 75.0000 mg | SUBCUTANEOUS | 11 refills | Status: AC

## 2023-11-13 NOTE — Telephone Encounter (Signed)
Hi Melissa!  I held off on sending a message request to triage to have them send the Praluent RX in to [facility] since you had requested PA, routing you in instead. Please let me know once the script is sent in and I will touch base with pharmacy on grant billing and with patient on grant approval.

## 2023-11-13 NOTE — Telephone Encounter (Signed)
-----   Message from Olene Floss sent at 11/12/2023  4:51 PM EST ----- Please do PA for Praluent. I25.10  coronary calcifications and stable aortic aneurysm Failed statins and Repatha

## 2023-11-13 NOTE — Telephone Encounter (Signed)
Patient Advocate Encounter   The patient was approved for a [employer] grant that will help cover the cost of PRALUENT Total amount awarded, $2500 Effective: 10/14/23 - 10/12/24   [id number] FAO:[id number] [id number] [id number]  Haze Rushing, CPhT  Pharmacy Patient Advocate Specialist  Direct Number: [phone] Fax: [phone]

## 2023-11-13 NOTE — Telephone Encounter (Signed)
Pharmacy Patient Advocate Encounter  Received notification from [facility] that Prior Authorization for praluent has been APPROVED from 11/13/23 to 11/11/24. Ran test claim, Copay is $249.00- one month-deductible. This test claim was processed through [facility]- copay amounts may vary at other pharmacies due to pharmacy/plan contracts, or as the patient moves through the different stages of their insurance plan.   PA #/Case ID/Reference #: [id number]

## 2023-11-13 NOTE — Telephone Encounter (Signed)
Of course! Thank you Layna.    Pharmacy has been provided with approval and processing information.

## 2023-11-17 DIAGNOSIS — G4733 Obstructive sleep apnea (adult) (pediatric): Secondary | ICD-10-CM | POA: Diagnosis not present

## 2023-12-04 ENCOUNTER — Encounter: Payer: Self-pay | Admitting: *Deleted

## 2023-12-07 ENCOUNTER — Ambulatory Visit: Payer: Medicare Other

## 2023-12-07 DIAGNOSIS — E782 Mixed hyperlipidemia: Secondary | ICD-10-CM

## 2023-12-08 LAB — APOLIPOPROTEIN B: Apolipoprotein B: 83 mg/dL (ref <90)

## 2023-12-08 LAB — LIPID PANEL
Chol/HDL Ratio: 3 {ratio} (ref 0.0–4.4)
Cholesterol, Total: 164 mg/dL (ref 100–199)
HDL: 54 mg/dL (ref >39)
LDL Chol Calc (NIH): 75 mg/dL (ref 0–99)
Triglycerides: 212 mg/dL — ABNORMAL HIGH (ref 0–149)
VLDL Cholesterol Cal: 35 mg/dL (ref 5–40)

## 2023-12-31 ENCOUNTER — Other Ambulatory Visit: Payer: Self-pay | Admitting: Internal Medicine

## 2023-12-31 ENCOUNTER — Ambulatory Visit
Admission: RE | Admit: 2023-12-31 | Discharge: 2023-12-31 | Disposition: A | Discharge status: Home / Self Care | Payer: Medicare Other | Source: Ambulatory Visit | Attending: Internal Medicine | Admitting: Internal Medicine

## 2023-12-31 DIAGNOSIS — N958 Other specified menopausal and perimenopausal disorders: Secondary | ICD-10-CM | POA: Diagnosis not present

## 2023-12-31 DIAGNOSIS — Z90722 Acquired absence of ovaries, bilateral: Secondary | ICD-10-CM | POA: Diagnosis not present

## 2023-12-31 DIAGNOSIS — M8588 Other specified disorders of bone density and structure, other site: Secondary | ICD-10-CM | POA: Diagnosis not present

## 2023-12-31 DIAGNOSIS — E2839 Other primary ovarian failure: Secondary | ICD-10-CM | POA: Diagnosis not present

## 2023-12-31 DIAGNOSIS — M8589 Other specified disorders of bone density and structure, multiple sites: Secondary | ICD-10-CM

## 2023-12-31 DIAGNOSIS — Z1231 Encounter for screening mammogram for malignant neoplasm of breast: Secondary | ICD-10-CM

## 2024-01-05 DIAGNOSIS — G4733 Obstructive sleep apnea (adult) (pediatric): Secondary | ICD-10-CM | POA: Diagnosis not present

## 2024-01-08 ENCOUNTER — Encounter: Payer: Self-pay | Admitting: Obstetrics and Gynecology

## 2024-01-08 ENCOUNTER — Ambulatory Visit: Payer: Medicare Other | Admitting: Obstetrics and Gynecology

## 2024-01-08 VITALS — BP 137/91 | HR 91

## 2024-01-08 DIAGNOSIS — R159 Full incontinence of feces: Secondary | ICD-10-CM | POA: Diagnosis not present

## 2024-01-08 DIAGNOSIS — N3281 Overactive bladder: Secondary | ICD-10-CM | POA: Diagnosis not present

## 2024-01-08 NOTE — Progress Notes (Signed)
 [facility]  Date of Visit: 01/08/2024  History of Present Illness: Ms. Rachel Gardner is a 78 y.o. female scheduled today for follow up for OAB and bowel leakage.  Surgery: s/p sacral neuromodulation (Interstim) stage I on 05/19/22 and stage II on 06/02/22 for overactive bladder and fecal incontinence  The device is working very well for her. She is usually dry but sometimes wears a pad if she is out for a long time. Currently on program 3 at 1.4 amplitude. Has been very careful with her diet and has rare bowel leakage but sometimes has to push for BM. She takes metamucil but not regularly.   Has a large hemorrhoid and was wondering who to see about this. She has a GI doctor she has not seen in a few years.   Medications: She has a current medication list which includes the following prescription(s): praluent , ascorbic acid, aspirin, bimatoprost, calcium, cholecalciferol, cyanocobalamin , fluticasone, hydrochlorothiazide , ibuprofen , levothyroxine , losartan , meclizine , omega-3 acid ethyl esters, and probiotic product.   Allergies: Patient is allergic to morphine, morphine and codeine, repatha  [evolocumab ], statins, tape, wellbutrin [bupropion], and denture adhesive.   Physical Exam: BP (!) 137/91   Pulse 91    Gen: No distress. AAO x3  ---------------------------------------------------------  Assessment and Plan:  1. Overactive bladder   2. Incontinence of feces, unspecified fecal incontinence type     - Needed to call [employer] tech services to reset the clinician app on her controller. Appears to be working well now.  - recommended f/u with her GI doctor regarding the hemorrhoid.  - Overall symptoms well controlled. Will keep current program 3 at amplitude 1.4  Will follow up in 1 year or sooner if needed  Rachel LOISE Caper, MD  Time spent: I spent 20 minutes dedicated to the care of this patient on the date of this encounter to include pre-visit review of records,  face-to-face time with the patient  and post visit documentation.

## 2024-01-27 ENCOUNTER — Encounter: Payer: Self-pay | Admitting: Neurology

## 2024-01-27 ENCOUNTER — Ambulatory Visit: Payer: Medicare Other | Admitting: Neurology

## 2024-01-27 VITALS — Ht 61.0 in | Wt 126.0 lb

## 2024-01-27 DIAGNOSIS — E538 Deficiency of other specified B group vitamins: Secondary | ICD-10-CM

## 2024-01-27 DIAGNOSIS — R413 Other amnesia: Secondary | ICD-10-CM | POA: Diagnosis not present

## 2024-01-27 DIAGNOSIS — F419 Anxiety disorder, unspecified: Secondary | ICD-10-CM

## 2024-01-27 DIAGNOSIS — E519 Thiamine deficiency, unspecified: Secondary | ICD-10-CM | POA: Diagnosis not present

## 2024-01-27 DIAGNOSIS — E559 Vitamin D deficiency, unspecified: Secondary | ICD-10-CM | POA: Diagnosis not present

## 2024-01-27 DIAGNOSIS — F32A Depression, unspecified: Secondary | ICD-10-CM

## 2024-01-27 DIAGNOSIS — W19XXXA Unspecified fall, initial encounter: Secondary | ICD-10-CM

## 2024-01-27 NOTE — Patient Instructions (Addendum)
CT of the head Blood work Formal Memory Testing - Dr. Eileen Stanford Renfroe  Normal cognitive aging can include: 1 occasional forgetfulness, slower processing speeds, and difficulty multitasking.  2. Forgetting some items of conversations if not persistent or worsening 3. Forgetting: It's normal to forget things like names, where you put your keys, or missing appointments.  4.  Slower processing: It can take longer to think things through or find words.  5.  Multitasking: It can be harder to do more than one thing at a time.  6.  Vocabulary: Vocabulary, reading, and verbal reasoning can improve or stay the same.  7.  Spatial ability: It can be harder to visualize or mentally rotate objects.  8. Forgetting what you got up to do, but you usually remember later   Memory Compensation Strategies  Use "WARM" strategy.  W= write it down  A= associate it  R= repeat it  M= make a mental note  2.   You can keep a Glass blower/designer.  Use a 3-ring notebook with sections for the following: calendar, important names and phone numbers,  medications, doctors' names/phone numbers, lists/reminders, and a section to journal what you did  each day.   3.    Use a calendar to write appointments down.  4.    Write yourself a schedule for the day.  This can be placed on the calendar or in a separate section of the Memory Notebook.  Keeping a  regular schedule can help memory.  5.    Use medication organizer with sections for each day or morning/evening pills.  You may need help loading it  6.    Keep a basket, or pegboard by the door.  Place items that you need to take out with you in the basket or on the pegboard.  You may also want to  include a message board for reminders.  7.    Use sticky notes.  Place sticky notes with reminders in a place where the task is performed.  For example: " turn off the  stove" placed by the stove, "lock the door" placed on the door at eye level, " take your medications" on  the  bathroom mirror or by the place where you normally take your medications.  8.    Use alarms/timers.  Use while cooking to remind yourself to check on food or as a reminder to take your medicine, or as a  reminder to make a call, or as a reminder to perform another task, etc.

## 2024-01-27 NOTE — Progress Notes (Unsigned)
[facility]    Provider:  Dr Lucia Gaskins Referring Provider: Dr. Dorma Russell, ENT Primary Care Physician:  Thana Ates, MD  CC:  Sudden onset Dizziness and Nausea  She went to [place] in July and her sister has FTD. She had a fall and broke 2 ribs, stress, it hurt. One day his some told her he felt she didn't come home from [place] as well as she was she forgot recent conversations, she has left her purse behind 3 times. She has a 29/30 on MoCA. She has OSA and is on cpap, she is compliant. Since she has come back from [place] feel more short-term memory. She lives adjacent to her son. She still drives, still goes grocery shopping, she becomes anxious driving at night so she doesn't so it. She pays her own bills, she is very organized, live independently, she cares for her dogs, not gettig lost. She goes to [employer]. She may forget some items but them remebers them no hx of alzheimers. Sister g3 with FTD. Her sister started with behavioral problems, behavioral varant FTD. She has also been stressed and worried since then. No FHx of dementia.  She can't remember things she gets up to do it and then forgets She has to leave home and write done a task of things to do but still forgets. In [place] she was raining her siblings. She has had a great memory. Denies stress now, no depression, no financial problems, she is anxious about her memory but no other anxieties, she knows there is normal cognitive aging but she has always had a great memory. Siter 3 years older no memory. Parents past away at 63 and 64 no memory problems.   02/23/2023 TSH 0.58   Interval history 02/22/2020: Patient last seen by me in 2018 for BPPV and at that time she was doing extremely well, she was symptoms free, vestibular therapy helped a lot. I do think psychosocial factors were involved with her presentation and when we first saw her she was under stress, crying throughout the whole appointment. Started when eating  breakfast, the room was spinning,   She had another episode, it was severe and she didn't have the medicine. She is doing her epley maneuvers. She got something over the counter whic helped a little. The meclizine helps a lot. She didn't have the meclizine, she stayed all day in bed with vertigo and imbalance and nausea.  She feels dizzy when she bends over, lightheaded. This is chronic and ongoing, no changes to symptoms similar to the past. No focal weakness or focal neuro deficits.   Interval history 10/25/2018:  lovely 78 y.o. female here as a referral from Dr. Dorma Russell  for dizziness, imbalance. She has a past medical history of hypertension, high cholesterol, glaucoma, cataracts, sleep apnea, cochlear implant, bilateral sensorineural hearing loss, tinnitus bilateral, vertiginous syndrome, hypothyroidism, BPPV. She was last seen in 2017 and was under extreme stress which was thought to be contributary, imaging was ordered and she was referred to vestibular therapy. Last month had an episode of vertigo in the middle of the night, the world circled her. She had nausea and dizziness. She could walk. She perfomed the Epley maneuvers with good results. No headache or migraines. A few days later she discovered the position that worsens the symptoms when moving around the fabric. She is completely resolved. Meclizine helped. She is completely better.   CTA head and neck 2017:  1. Calcified aortic atherosclerosis. See Chest CTA reported separately today.  2. Soft and calcified bilateral carotid artery atherosclerosis but no associated stenosis. 3. 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 The dominant right vertebral artery is patent without stenosis despite some calcified plaque, and supplies the basilar. 4. The posterior circulation otherwise is negative. There is a fetal  type origin of the left PCA. 5. Negative CT appearance of the brain. Previous bilateral cochlear implants. Mild chronic right mastoid effusion.  HPI:  ANESSA CHARLEY is a lovely 78 y.o. female here as a referral from Dr. Dorma Russell  for dizziness, imbalance. She has a past medical history of hypertension, high cholesterol, glaucoma, cataracts, sleep apnea, cochlear implant, bilateral sensorineural hearing loss, tinnitus bilateral, vertiginous syndrome, hypothyroidism, BPPV. She is currently under a lot stress. Patient and son have intense conversation today, she cries and admits she is depressed and she is smoking again. Patient was in yoga class  6 weeks ago, her head was below the body and she became dizzy and nauseated. She has a history of bppv. She has tinnitus happens 1-2x a day and lasts a few seconds, the dizziness only started after the yoga class, she describes the dizziness like she is "floating" she loses her balance, like she "misses air", like her body is moving, lasts seconds but happens every day and more associated with getting up quickly or quick movements. She lost her balance when she got up quickly and hit her toe on the corner of her shelf but no fall. Sometime she is taking a shower and if drop something difficult for her to pick it up and difficult to get up. Son is here and provides information. She has not "worked out" in years, she is very complacent, yoga was her attempt to restart physical activity and unfortunately she became dizzy.  She feels nauseated when she bends over, when she moves her head to the right she feels nauseated. The tinnitus started at yoga too. Also balance loss but she attributed it to age and disuse, when she gets up quickly she staggers or when turns her head too quickly. She has two dogs and when she bends down to pick up the dog's food and when she gets up she needs to hold onto somethine. She c/o chronic double vision after cataract removal. She smokes currently.  Son is here and endorses her smoking. She is under a lot of stress, no headaches or migraines, no other associated symptoms or modifying factors.  Reviewed notes, labs and imaging from outside physicians, which showed:  Personally reviewed imaging and agree with the following CT head 2015: FINDINGS: There is artifact bilaterally from cochlear implants. Study is therefore somewhat less than optimal due to susceptibility artifact from the cochlear implants. The ventricles are normal in size and configuration. There is no demonstrable mass, hemorrhage, extra-axial fluid collection, or midline shift. No focal gray-white compartments are appreciable. No acute infarct is evident. The bony calvarium appears intact. There is opacification of several inferior mastoids on the right. Mastoids elsewhere clear. As noted above, cochlear implants are present bilaterally.   IMPRESSION: Study limited due to cochlear implants bilaterally with associated magnetic susceptibility artifact. Allowing for areas of limited visualization due to the artifact from the cochlear implants, no focal intracranial lesion is appreciable. No mass, hemorrhage, or extra-axial fluid collection is appreciable in particular. Note that there is opacification of multiple inferior mastoid air cells on the  right.  Review of Systems: Patient complains of symptoms per HPI as well as the following symptoms: dizziness, vertigo. Pertinent negatives per HPI. All others negative.   Social History   Socioeconomic History   Marital status: Divorced    Spouse name: Not on file   Number of children: 3   Years of education: Not on file   Highest education level: Not on file  Occupational History   Occupation: retired Engineer, civil (consulting)  Tobacco Use   Smoking status: Former    Current packs/day: 0.00    Average packs/day: 0.5 packs/day for 41.0 years (20.5 ttl pk-yrs)    Types: Cigarettes    Start date: 07/26/1980    Quit date: 07/26/2021     Years since quitting: 2.5   Smokeless tobacco: Never   Tobacco comments:    Quit 4 months ago, 1/2 pack a day [facility] 11/01/21  Vaping Use   Vaping status: Never Used  Substance and Sexual Activity   Alcohol use: Not Currently    Comment: very rare   Drug use: Never   Sexual activity: Not on file  Other Topics Concern   Not on file  Social History Narrative   Regular exercise: walk; 1 mile a day / walk 5 miles weekend;    update 02/22/2020 not exercising   Caffeine use: 1-2 cups of coffee per day   3 children   Lives alone with her dogs, son lives next door   Social Drivers of Corporate investment banker Strain: Not on file  Food Insecurity: Not on file  Transportation Needs: Not on file  Physical Activity: Not on file  Stress: Not on file  Social Connections: Not on file  Intimate Partner Violence: Not on file    Family History  Problem Relation Age of Onset   Hypertension Mother    Hypertension Father    Hypertension Sister    Thyroid disease Sister    Colon cancer Maternal Aunt    Prostate cancer Paternal Uncle    Colon cancer Cousin        x 2; paternal   Prostate cancer Brother    Hyperlipidemia Brother    Colon polyps Neg Hx    Esophageal cancer Neg Hx    Stomach cancer Neg Hx    Rectal cancer Neg Hx    Breast cancer Neg Hx     Past Medical History:  Diagnosis Date   BPPV (benign paroxysmal positional vertigo)    neurologist--- dr Lucia Gaskins   Cochlear implant in place    bilateral   Coronary artery calcification    cardiologist--- dr Anne Fu   DDD (degenerative disc disease), lumbosacral    Fecal incontinence    intermittant , wears depends   Genital herpes    GERD (gastroesophageal reflux disease)    Glaucoma, both eyes    Hiatal hernia    History of adenomatous polyp of colon    History of diverticulitis of colon    w/ hx sugical intervention hemicolectomy in 2012   History of Helicobacter pylori infection    History of kidney stones     Hyperlipidemia, mixed    Hypertension    Hypothyroidism, postsurgical    endocrinologist-- dr Everardo All;   s/p total thyroidectomy for goiter, per dr Everardo All note benign   Incontinence of urine in female    wears depends   Lung nodule seen on imaging study    followed by dr Marchelle Gearing;  per lov note chronic stable, LLL   Lymphadenopathy  followed by pcp;  evaluated by oncologist-- dr Lenis Dickinson note 08-22-2019 in epic   Meniere's disease    OAB (overactive bladder)    OSA on CPAP    followed by dr t. Mayford Knife;   sleep study in epic 10-22-2018  moderate osa   Pulmonary emphysema [facility])    pulmonology--- dr Judie Petit. Marchelle Gearing   Sensorineural hearing loss (SNHL) of both ears    s/p cochlear implant's   SOB (shortness of breath)    Thoracic ascending aortic aneurysm (HCC)    followed by dr Laneta Simmers--- 4.1 cm per 01-21-2022 chest ct   Wears glasses    Wears partial dentures upper and lower     Past Surgical History:  Procedure Laterality Date   ABDOMINAL HYSTERECTOMY  1995   ANAL RECTAL MANOMETRY N/A 08/06/2016   Procedure: ANO RECTAL MANOMETRY;  Surgeon: Sherrilyn Rist, MD;  Location: [facility];  Service: Gastroenterology;  Laterality: N/A;   CATARACT EXTRACTION W/ INTRAOCULAR LENS IMPLANT Bilateral    yrs ago   CHOLECYSTECTOMY OPEN  1989   W/ APPENDECTOMY   COCHLEAR IMPLANT Bilateral    Left/  2009;  Right / 2012   COLONOSCOPY WITH PROPOFOL  04/10/2022   by dr h. danis   CYSTOSCOPY N/A 03/06/2022   Procedure: CYSTOSCOPY With biopsy;  Surgeon: Marguerita Beards, MD;  Location: [facility];  Service: Gynecology;  Laterality: N/A;  total time requested is 30 min   ESOPHAGOGASTRODUODENOSCOPY  02/03/2014   by dr d. Dalbert Batman IMPLANT PLACEMENT N/A 05/19/2022   Procedure: Leane Platt IMPLANT FIRST STAGE;  Surgeon: Marguerita Beards, MD;  Location: [facility];  Service: Gynecology;  Laterality: N/A;   INTERSTIM IMPLANT PLACEMENT N/A 06/02/2022    Procedure: Leane Platt IMPLANT SECOND STAGE;  Surgeon: Marguerita Beards, MD;  Location: [facility];  Service: Gynecology;  Laterality: N/A;   LAPAROSCOPIC SIGMOID COLECTOMY  02/2011   in [place];    with takedown of splenic flexure; cystoscopy with bilateral ureteral stent placement   REDUCTION MAMMAPLASTY Bilateral 1989   [place]   TOTAL THYROIDECTOMY Bilateral 1995   WISDOM TOOTH EXTRACTION     yrs ago    Current Outpatient Medications  Medication Sig Dispense Refill   Alirocumab (PRALUENT) 75 MG/ML SOAJ Inject 1 mL (75 mg total) into the skin every 14 (fourteen) days. 2 mL 11   ascorbic acid (VITAMIN C) 1000 MG tablet Take 500 mg by mouth daily.      aspirin 81 MG chewable tablet Chew 81 mg by mouth daily.      bimatoprost (LUMIGAN) 0.01 % SOLN Place 1 drop into both eyes at bedtime.     CALCIUM PO Take 1 tablet by mouth daily.     Cholecalciferol (VITAMIN D3 PO) Take 1 tablet by mouth daily.     Cyanocobalamin (VITAMIN B-12 PO) Take 1 tablet by mouth daily.     fluticasone (FLONASE) 50 MCG/ACT nasal spray Place 2 sprays into both nostrils at bedtime.     hydrochlorothiazide (HYDRODIURIL) 25 MG tablet Take 1 tablet (25 mg total) by mouth daily. 90 tablet 3   ibuprofen (ADVIL) 600 MG tablet Take 1 tablet (600 mg total) by mouth every 6 (six) hours as needed. 30 tablet 0   levothyroxine (SYNTHROID) 112 MCG tablet Take 1 tablet (112 mcg total) by mouth daily. 90 tablet 3   losartan (COZAAR) 100 MG tablet TAKE 1 TABLET(100 MG) BY MOUTH DAILY 90 tablet 3  meclizine (ANTIVERT) 25 MG tablet Take 1 tablet (25 mg total) by mouth 3 (three) times daily as needed for dizziness. 90 tablet 9   omega-3 acid ethyl esters (LOVAZA) 1 g capsule Take 2 capsules (2 g total) by mouth 2 (two) times daily. 360 capsule 3   Probiotic Product (PROBIOTIC ADVANCED PO) Take 1 capsule by mouth daily.     No current facility-administered medications for this visit.    Allergies as of 01/27/2024 -  Review Complete 01/27/2024  Allergen Reaction Noted   Morphine Nausea And Vomiting 07/19/2020   Morphine and codeine  05/24/2014   Repatha [evolocumab] Other (See Comments) 11/02/2023   Statins Other (See Comments) 08/16/2013   Tape Other (See Comments) 04/25/2015   Wellbutrin [bupropion] Other (See Comments) 02/13/2015   Denture adhesive Rash 07/19/2020    Vitals: Ht 5\' 1"  (1.549 m)   Wt 126 lb (57.2 kg)   BMI 23.81 kg/m  Last Weight:  Wt Readings from Last 1 Encounters:  01/27/24 126 lb (57.2 kg)   Last Height:   Ht Readings from Last 1 Encounters:  01/27/24 5\' 1"  (1.549 m)   Physical exam: Exam: Gen: NAD, conversant, well nourised, well groomed                      Neuro: Detailed Neurologic Exam  Speech:    Speech is normal; fluent and spontaneous with normal comprehension.  Cognition:     01/27/2024   10:03 AM 10/08/2017    2:34 PM 04/25/2015    9:38 AM  MMSE - Mini Mental State Exam  Not completed:   Unable to complete  Orientation to time 5 5   Orientation to Place 5 5   Registration 3    Attention/ Calculation 5    Recall 3    Language- name 2 objects 2    Language- repeat 1    Language- follow 3 step command 2    Language- read & follow direction 1    Write a sentence 1    Copy design 1    Total score 29         The patient is oriented to person, place, and time;     recent and remote memory intact;     language fluent;     normal attention, concentration,     fund of knowledge Cranial Nerves:    The pupils are equal, round, and reactive to light. Visual fields are full to finger confrontation. Extraocular movements are intact. Trigeminal sensation is intact and the muscles of mastication are normal. The face is symmetric. The palate elevates in the midline. Hearing intact. Voice is normal. Shoulder shrug is normal. The tongue has normal motion without fasciculations.   Gait:    Normal gait  Motor Observation:    No asymmetry, no atrophy, and  no involuntary movements noted. Tone:    Normal muscle tone.    Posture:    Posture is normal. normal erect    Strength:    Strength is V/V in the upper and lower limbs.      Sensation: intact to LT, pin prick, temperature, vibration, proprioception, neg Romberg     Reflex Exam:  DTR's:    Deep tendon reflexes in the upper and lower extremities are normal bilaterally.          Assessment/Plan:    DAYRA RAPLEY is an absolutely lovely 78  y.o., today returns to clinic with another episode of BPPV.  PMHX BPPV, dizziness, imbalance. Epley maneuvers helped in the past. No focal neurologic deficits. Patient last seen by me in 2019 for BPPV and at that time she was doing extremely well, she was symptom free, vestibular therapy helped a lot as did the epley maneuvers. I do think psychosocial factors were significantly involved with her initial presentation and severity of symptoms - when we first saw her for worsening vertigo she was under stress, crying throughout the whole appointment and stated she was depressed. When I saw her again she felt better, under less stress and her BPPV symptoms were improved.  CTA of the head and neck were ordered and she was referred to vestibular therapy in 2017 and 2019 and she has been successful at performing the Epley maneuvers. We gave her more meclizine, she can do the Epley maneuvers and today she is completely symptom free.     I do not appreciate any neurodegenerative disease. We can order some testing, check in with NP in 9 months and hen yearly or come back as needed. I suspect this is more anxiety and depression which is long-standing. She states its only now since she is forgetting but I saw he rin 2021 and she was crying and exhibited depression and stress and I recommended therapy and treatment  In 2021: Depression, stress: She is crying in the office today, admits she is depressed and stressed. Discussed with son and patient that she should seek  therapy and discuss with primary care possible medication treatment for her depression such as an SSRI.   Would appreciate formal memory testing and if Dr. Rueben Bash sees a component of anxiety and stress woukd be great to have patient get therapy as well  Vascular risk factors for stroke: She should stop smoking. She should take a daily baby aspirin for stroke prevention. She needs to follow with primary care for management of vascular risk factors for stroke such as smoking cessation, cholesterol and blood pressure management, monitoring for diabetes. Patient has a history of sleep apnea, she should follow with her sleep doctor in the past or we can discuss a new study in the future if needed.  Dizziness, imbalance: We'll order physical therapy for both safety and gait, balance and vestibular exercises. Discussed regular exercise and looking into Silver Sneakers. Will order CT head.  CTA of the head and neck to evaluate for intracranial stenosis or carotid stenosis or other intracranial or vascular etiologies for her dizziness, postural dizziness with pre-syncope, lightheadedness. Will check labs today.   Orthostatic vital signs are normal in the office today. Discussed hydration and good diet.   Patient will follow-up with me after physical therapy.  Cc: Dr. Lenore Cordia, MD  [facility] [address] [place], [place] [address]  Phone [phone] Fax [phone]  A total of 15 minutes was spent face-to-face with this patient. Over half this time was spent on counseling patient on the  No diagnosis found.   diagnosis and different diagnostic and therapeutic options, counseling and coordination of care, risks and benefits of management, compliance, or risk factor reduction and education.

## 2024-01-28 NOTE — Addendum Note (Signed)
Addended by: Naomie Dean B on: 01/28/2024 05:29 PM   Modules accepted: Level of Service

## 2024-01-31 ENCOUNTER — Encounter: Payer: Self-pay | Admitting: Neurology

## 2024-02-01 ENCOUNTER — Other Ambulatory Visit: Payer: Self-pay | Admitting: Neurology

## 2024-02-01 ENCOUNTER — Telehealth: Payer: Self-pay | Admitting: Neurology

## 2024-02-01 ENCOUNTER — Ambulatory Visit
Admission: RE | Admit: 2024-02-01 | Discharge: 2024-02-01 | Disposition: A | Discharge status: Home / Self Care | Payer: Medicare Other | Source: Ambulatory Visit | Attending: Neurology

## 2024-02-01 DIAGNOSIS — R413 Other amnesia: Secondary | ICD-10-CM

## 2024-02-01 DIAGNOSIS — W19XXXA Unspecified fall, initial encounter: Secondary | ICD-10-CM | POA: Diagnosis not present

## 2024-02-01 LAB — COMPREHENSIVE METABOLIC PANEL
ALT: 12 [IU]/L (ref 0–32)
AST: 18 [IU]/L (ref 0–40)
Albumin: 4.3 g/dL (ref 3.8–4.8)
Alkaline Phosphatase: 95 [IU]/L (ref 44–121)
BUN/Creatinine Ratio: 26 (ref 12–28)
BUN: 24 mg/dL (ref 8–27)
Bilirubin Total: 0.4 mg/dL (ref 0.0–1.2)
CO2: 26 mmol/L (ref 20–29)
Calcium: 10.5 mg/dL — ABNORMAL HIGH (ref 8.7–10.3)
Chloride: 103 mmol/L (ref 96–106)
Creatinine, Ser: 0.93 mg/dL (ref 0.57–1.00)
Globulin, Total: 3.3 g/dL (ref 1.5–4.5)
Glucose: 79 mg/dL (ref 70–99)
Potassium: 3.9 mmol/L (ref 3.5–5.2)
Sodium: 145 mmol/L — ABNORMAL HIGH (ref 134–144)
Total Protein: 7.6 g/dL (ref 6.0–8.5)
eGFR: 64 mL/min/{1.73_m2} (ref >59)

## 2024-02-01 LAB — CBC WITH DIFFERENTIAL/PLATELET
Basophils Absolute: 0 10*3/uL (ref 0.0–0.2)
Basos: 0 %
EOS (ABSOLUTE): 0.2 10*3/uL (ref 0.0–0.4)
Eos: 2 %
Hematocrit: 44.8 % (ref 34.0–46.6)
Hemoglobin: 14.7 g/dL (ref 11.1–15.9)
Immature Grans (Abs): 0 10*3/uL (ref 0.0–0.1)
Immature Granulocytes: 0 %
Lymphocytes Absolute: 2.6 10*3/uL (ref 0.7–3.1)
Lymphs: 33 %
MCH: 29.1 pg (ref 26.6–33.0)
MCHC: 32.8 g/dL (ref 31.5–35.7)
MCV: 89 fL (ref 79–97)
Monocytes Absolute: 0.8 10*3/uL (ref 0.1–0.9)
Monocytes: 10 %
Neutrophils Absolute: 4.3 10*3/uL (ref 1.4–7.0)
Neutrophils: 55 %
Platelets: 246 10*3/uL (ref 150–450)
RBC: 5.06 x10E6/uL (ref 3.77–5.28)
RDW: 12.6 % (ref 11.7–15.4)
WBC: 7.9 10*3/uL (ref 3.4–10.8)

## 2024-02-01 LAB — METHYLMALONIC ACID, SERUM: Methylmalonic Acid: 168 nmol/L (ref 0–378)

## 2024-02-01 LAB — B12 AND FOLATE PANEL
Folate: 11.7 ng/mL (ref >3.0)
Vitamin B-12: >2000 pg/mL — ABNORMAL HIGH (ref 232–1245)

## 2024-02-01 LAB — VITAMIN B1: Thiamine: 113.7 nmol/L (ref 66.5–200.0)

## 2024-02-01 LAB — VITAMIN D 25 HYDROXY (VIT D DEFICIENCY, FRACTURES): Vit D, 25-Hydroxy: 60.8 ng/mL (ref 30.0–100.0)

## 2024-02-01 NOTE — Telephone Encounter (Signed)
Neuropsych referral faxed to [facility] (fax#[phone], phone# [phone])

## 2024-02-02 ENCOUNTER — Encounter: Payer: Self-pay | Admitting: Neurology

## 2024-02-22 ENCOUNTER — Ambulatory Visit: Payer: Medicare Other

## 2024-03-07 DIAGNOSIS — H903 Sensorineural hearing loss, bilateral: Secondary | ICD-10-CM | POA: Diagnosis not present

## 2024-03-10 DIAGNOSIS — E7800 Pure hypercholesterolemia, unspecified: Secondary | ICD-10-CM | POA: Diagnosis not present

## 2024-03-10 DIAGNOSIS — E890 Postprocedural hypothyroidism: Secondary | ICD-10-CM | POA: Diagnosis not present

## 2024-03-10 DIAGNOSIS — G4733 Obstructive sleep apnea (adult) (pediatric): Secondary | ICD-10-CM | POA: Diagnosis not present

## 2024-03-10 DIAGNOSIS — I7121 Aneurysm of the ascending aorta, without rupture: Secondary | ICD-10-CM | POA: Diagnosis not present

## 2024-03-10 DIAGNOSIS — Z0000 Encounter for general adult medical examination without abnormal findings: Secondary | ICD-10-CM | POA: Diagnosis not present

## 2024-03-10 DIAGNOSIS — I10 Essential (primary) hypertension: Secondary | ICD-10-CM | POA: Diagnosis not present

## 2024-03-10 DIAGNOSIS — Z23 Encounter for immunization: Secondary | ICD-10-CM | POA: Diagnosis not present

## 2024-03-10 DIAGNOSIS — R32 Unspecified urinary incontinence: Secondary | ICD-10-CM | POA: Diagnosis not present

## 2024-03-16 DIAGNOSIS — H401131 Primary open-angle glaucoma, bilateral, mild stage: Secondary | ICD-10-CM | POA: Diagnosis not present

## 2024-03-16 DIAGNOSIS — H04122 Dry eye syndrome of left lacrimal gland: Secondary | ICD-10-CM | POA: Diagnosis not present

## 2024-03-18 DIAGNOSIS — J040 Acute laryngitis: Secondary | ICD-10-CM | POA: Diagnosis not present

## 2024-03-18 DIAGNOSIS — J019 Acute sinusitis, unspecified: Secondary | ICD-10-CM | POA: Diagnosis not present

## 2024-03-22 ENCOUNTER — Ambulatory Visit
Admission: RE | Admit: 2024-03-22 | Discharge: 2024-03-22 | Disposition: A | Discharge status: Home / Self Care | Payer: Medicare Other | Source: Ambulatory Visit | Attending: Internal Medicine | Admitting: Internal Medicine

## 2024-03-22 DIAGNOSIS — Z1231 Encounter for screening mammogram for malignant neoplasm of breast: Secondary | ICD-10-CM

## 2024-03-25 DIAGNOSIS — J0100 Acute maxillary sinusitis, unspecified: Secondary | ICD-10-CM | POA: Diagnosis not present

## 2024-03-25 DIAGNOSIS — Z9621 Cochlear implant status: Secondary | ICD-10-CM | POA: Diagnosis not present

## 2024-03-25 DIAGNOSIS — J040 Acute laryngitis: Secondary | ICD-10-CM | POA: Diagnosis not present

## 2024-04-04 DIAGNOSIS — R413 Other amnesia: Secondary | ICD-10-CM | POA: Diagnosis not present

## 2024-04-05 DIAGNOSIS — G4733 Obstructive sleep apnea (adult) (pediatric): Secondary | ICD-10-CM | POA: Diagnosis not present

## 2024-04-20 DIAGNOSIS — R413 Other amnesia: Secondary | ICD-10-CM | POA: Diagnosis not present

## 2024-04-26 DIAGNOSIS — Z79899 Other long term (current) drug therapy: Secondary | ICD-10-CM | POA: Diagnosis not present

## 2024-05-05 NOTE — Telephone Encounter (Signed)
 Neuropsych test results received from Dr Alec Huntington. Results sent to medical records for scanning and also placed on Dr Harding Li desk for review. Next appt in October with Megan NP.

## 2024-06-13 ENCOUNTER — Encounter: Payer: Self-pay | Admitting: Cardiology

## 2024-06-13 ENCOUNTER — Ambulatory Visit: Attending: Cardiology | Admitting: Cardiology

## 2024-06-13 VITALS — BP 106/69 | HR 86 | Ht 61.0 in | Wt 127.0 lb

## 2024-06-13 DIAGNOSIS — I10 Essential (primary) hypertension: Secondary | ICD-10-CM

## 2024-06-13 DIAGNOSIS — E782 Mixed hyperlipidemia: Secondary | ICD-10-CM

## 2024-06-13 DIAGNOSIS — Z79899 Other long term (current) drug therapy: Secondary | ICD-10-CM

## 2024-06-13 MED ORDER — OMEGA-3-ACID ETHYL ESTERS 1 G PO CAPS: 2.0000 g | ORAL_CAPSULE | Freq: Two times a day (BID) | ORAL | 3 refills | Status: AC

## 2024-06-13 NOTE — Progress Notes (Signed)
 Cardiology Office Note:  .   Date:  06/13/2024  ID:  Rachel Gardner, DOB [birth year], MRN [id number] PCP: Tena Feeling, MD  [facility] Providers Cardiologist:  Dorothye Gathers, MD Sleep Medicine:  Gaylyn Keas, MD     History of Present Illness: .   Rachel Gardner is a 78 y.o. female Discussed the use of AI scribe software for clinical note transcription with the patient, who gave verbal consent to proceed.  History of Present Illness Rachel Gardner is a 78 year old female with aortic atherosclerosis and hyperlipidemia who presents for a follow-up visit.  She has a history of aortic atherosclerosis with a 4 cm ascending aorta noted on a CT scan in 2021, and a measurement of 4.1 cm on an echocardiogram in 2024. She has been experiencing increased stress recently, which she attributes to family health issues during a recent trip to [place]. No new symptoms related to her cardiovascular health.  She has a history of hyperlipidemia and has had trouble with statins in the past. Currently, she is on Praluent  75 mg every 14 days and takes aspirin 81 mg daily. She also takes hydrochlorothiazide  25 mg daily and losartan  100 mg daily for hypertension. She is concerned about her triglyceride levels.  Her past medical history includes benign positional vertigo diagnosed in 2013, a cochlear implant, and an extensive neurological workup. She experienced a fall last year in [place], resulting in three broken ribs, and underwent a neurological evaluation which was unremarkable except for stress-related symptoms.  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 She recently traveled to [place], [place], [place], and [place] for her 71th birthday. She has been managing stress through reflexology and self-care practices.  Reports increased stress and a history of stress-related symptoms.    Studies Reviewed: Aaron Aas   EKG Interpretation Date/Time:  Monday June 13 2024 09:34:21 EDT Ventricular Rate:   86 PR Interval:  150 QRS Duration:  78 QT Interval:  384 QTC Calculation: 459 R Axis:   39  Text Interpretation: Normal sinus rhythm Right atrial enlargement When compared with ECG of 02-Nov-2019 09:16, No significant change was found Confirmed by Dorothye Gathers ([id number]) on 06/13/2024 10:08:02 AM    Results LABS Thyroid : 112 (March 2025)  RADIOLOGY CT scan: 4 cm ascending aorta (2021)  DIAGNOSTIC Echocardiogram: 41 mm aorta, stable (2024) EKG: Normal Risk Assessment/Calculations:            Physical Exam:   VS:  BP 106/69   Pulse 86   Ht 5' 1 (1.549 m)   Wt 127 lb (57.6 kg)   SpO2 97%   BMI 24.00 kg/m    Wt Readings from Last 3 Encounters:  06/13/24 127 lb (57.6 kg)  01/27/24 126 lb (57.2 kg)  03/02/23 138 lb (62.6 kg)    GEN: Well nourished, well developed in no acute distress NECK: No JVD; No carotid bruits CARDIAC: RRR, no murmurs, no rubs, no gallops RESPIRATORY:  Clear to auscultation without rales, wheezing or rhonchi  ABDOMEN: Soft, non-tender, non-distended EXTREMITIES:  No edema; No deformity   ASSESSMENT AND PLAN: .    Assessment and Plan Assessment & Plan Aortic atherosclerosis Aortic atherosclerosis is managed with aspirin 81 mg daily.  Dilated aortic root Dilated aortic root measures 41 mm on the last echocardiogram in 2024. Will periodically monitor, stable from 2021 CT.   Mixed hyperlipidemia Mixed hyperlipidemia is managed with Praluent  75 mg every 14 days. She experienced body aches and  low energy with statins and Repatha . Benefits of fish oil (Lovaza ) for triglyceride management were discussed. - Check lipid panel today - Re-Prescribe Lovaza  or fish oil  Hypertension Hypertension is managed with hydrochlorothiazide  25 mg daily and losartan  100 mg daily.  Rib fractures Rib fractures from a fall in [place] last year.  Follow-up Plan to monitor current conditions and medication efficacy. - Schedule follow-up in one year - Send lipid panel  results to Ot[facility] Blew         Dispo: 1 yr  Signed, Dorothye Gathers, MD

## 2024-06-13 NOTE — Patient Instructions (Addendum)
 Medication Instructions:  The current medical regimen is effective;  continue present plan and medications.  *If you need a refill on your cardiac medications before your next appointment, please call your pharmacy*  Lab: Please have blood work today (Lipid)  Follow-Up: At [facility], you and your health needs are our priority.  As part of our continuing mission to provide you with exceptional heart care, our providers are all part of one team.  This team includes your primary Cardiologist (physician) and Advanced Practice Providers or APPs (Physician Assistants and Nurse Practitioners) who all work together to provide you with the care you need, when you need it.  Your next appointment:   1 year(s)  Provider:   Dorothye Gathers, MD    We recommend signing up for the patient portal called MyChart.  Sign up information is provided on this After Visit Summary.  MyChart is used to connect with patients for Virtual Visits (Telemedicine).  Patients are able to view lab/test results, encounter notes, upcoming appointments, etc.  Non-urgent messages can be sent to your provider as well.   To learn more about what you can do with MyChart, go to [email or web address].

## 2024-06-14 LAB — LIPID PANEL
Chol/HDL Ratio: 3 ratio (ref 0.0–4.4)
Cholesterol, Total: 142 mg/dL (ref 100–199)
HDL: 48 mg/dL (ref >39)
LDL Chol Calc (NIH): 51 mg/dL (ref 0–99)
Triglycerides: 278 mg/dL — ABNORMAL HIGH (ref 0–149)
VLDL Cholesterol Cal: 43 mg/dL — ABNORMAL HIGH (ref 5–40)

## 2024-06-17 ENCOUNTER — Ambulatory Visit (HOSPITAL_BASED_OUTPATIENT_CLINIC_OR_DEPARTMENT_OTHER): Payer: Self-pay | Admitting: Cardiology

## 2024-06-30 DIAGNOSIS — M542 Cervicalgia: Secondary | ICD-10-CM | POA: Diagnosis not present

## 2024-07-07 DIAGNOSIS — M50022 Cervical disc disorder at C5-C6 level with myelopathy: Secondary | ICD-10-CM | POA: Diagnosis not present

## 2024-07-07 DIAGNOSIS — M50023 Cervical disc disorder at C6-C7 level with myelopathy: Secondary | ICD-10-CM | POA: Diagnosis not present

## 2024-07-11 DIAGNOSIS — M50023 Cervical disc disorder at C6-C7 level with myelopathy: Secondary | ICD-10-CM | POA: Diagnosis not present

## 2024-07-11 DIAGNOSIS — M50022 Cervical disc disorder at C5-C6 level with myelopathy: Secondary | ICD-10-CM | POA: Diagnosis not present

## 2024-07-21 DIAGNOSIS — M542 Cervicalgia: Secondary | ICD-10-CM | POA: Diagnosis not present

## 2024-07-21 DIAGNOSIS — R200 Anesthesia of skin: Secondary | ICD-10-CM | POA: Diagnosis not present

## 2024-08-25 ENCOUNTER — Other Ambulatory Visit (HOSPITAL_BASED_OUTPATIENT_CLINIC_OR_DEPARTMENT_OTHER): Payer: Self-pay | Admitting: Internal Medicine

## 2024-08-25 ENCOUNTER — Ambulatory Visit (HOSPITAL_COMMUNITY): Admission: RE | Admit: 2024-08-25 | Source: Ambulatory Visit

## 2024-08-25 ENCOUNTER — Other Ambulatory Visit

## 2024-08-25 DIAGNOSIS — R42 Dizziness and giddiness: Secondary | ICD-10-CM

## 2024-08-25 DIAGNOSIS — R5383 Other fatigue: Secondary | ICD-10-CM | POA: Diagnosis not present

## 2024-08-26 ENCOUNTER — Other Ambulatory Visit: Payer: Self-pay | Admitting: Internal Medicine

## 2024-08-26 DIAGNOSIS — R42 Dizziness and giddiness: Secondary | ICD-10-CM

## 2024-08-30 ENCOUNTER — Ambulatory Visit
Admission: RE | Admit: 2024-08-30 | Discharge: 2024-08-30 | Disposition: A | Discharge status: Home / Self Care | Source: Ambulatory Visit | Attending: Internal Medicine | Admitting: Internal Medicine

## 2024-08-30 DIAGNOSIS — R42 Dizziness and giddiness: Secondary | ICD-10-CM

## 2024-09-04 NOTE — Progress Notes (Unsigned)
 Rachel Gardner [id number] [birth year]   Chief Complaint:  Referring Provider: Dwight Trula SHAUNNA, MD Primary GI MD: Dr. Legrand  HPI: Rachel Gardner is a 78 y.o. female with past medical history of BPPV, cochlear implant, GERD, hiatal hernia, history of adenomatous colon polyps, history of diverticulitis, history of H. pylori infection, history of kidney stones, HLD, HTN, hypothyroidism s/p thyroidectomy for goiter, OSA on CPAP, emphysema, thoracic ascending aortic aneurysm, cholecystectomy, hysterectomy, anorectal manometry 2017, laparoscopic sigmoid colectomy who presents today for a complaint of *** .    Patient last seen in office 08/22/2020 at which time reported several year history of intermittent fecal incontinence secondary to very low anal sphincter resting and squeeze pressures and incomplete relaxation of the anal sphincter.  She had improvement with pelvic floor physical therapy and had been managing well, but developed low back pain and since that time had increase in fecal incontinence.  Opted for nonsurgical management and physical therapy with improvement in back pain and fecal incontinence.  Here for hemorrhoids? Rectal prolapse?   Previous GI Procedures/Imaging   Colonoscopy 04/10/2022 - Decreased sphincter tone found on digital rectal exam.  - Diverticulosis in the left colon.  - The examination was otherwise normal on direct and retroflexion views.  - No specimens collected. - No recall due to age   Past Medical History:  Diagnosis Date   BPPV (benign paroxysmal positional vertigo)    neurologist--- dr ines   Cochlear implant in place    bilateral   Coronary artery calcification    cardiologist--- dr jeffrie   DDD (degenerative disc disease), lumbosacral    Fecal incontinence    intermittant , wears depends   Genital herpes    GERD (gastroesophageal reflux disease)    Glaucoma, both eyes    Hiatal hernia    History of adenomatous polyp of colon    History of  diverticulitis of colon    w/ hx sugical intervention hemicolectomy in 2012   History of Helicobacter pylori infection    History of kidney stones    Hyperlipidemia, mixed    Hypertension    Hypothyroidism, postsurgical    endocrinologist-- dr kassie;   s/p total thyroidectomy for goiter, per dr kassie note benign   Incontinence of urine in female    wears depends   Lung nodule seen on imaging study    followed by dr geronimo;  per lov note chronic stable, LLL   Lymphadenopathy    followed by pcp;  evaluated by oncologist-- dr onesimo ronco note 08-22-2019 in epic   Meniere's disease    OAB (overactive bladder)    OSA on CPAP    followed by dr t. shlomo;   sleep study in epic 10-22-2018  moderate osa   Pulmonary emphysema [facility])    pulmonology--- dr christella. geronimo   Sensorineural hearing loss (SNHL) of both ears    s/p cochlear implant's   SOB (shortness of breath)    Thoracic ascending aortic aneurysm (HCC)    followed by dr lucas--- 4.1 cm per 01-21-2022 chest ct   Wears glasses    Wears partial dentures upper and lower     Past Surgical History:  Procedure Laterality Date   ABDOMINAL HYSTERECTOMY  1995   ANAL RECTAL MANOMETRY N/A 08/06/2016   Procedure: ANO RECTAL MANOMETRY;  Surgeon: Victory LITTIE Legrand DOUGLAS, MD;  Location: [facility];  Service: Gastroenterology;  Laterality: N/A;   CATARACT EXTRACTION W/ INTRAOCULAR LENS IMPLANT Bilateral    yrs ago  CHOLECYSTECTOMY OPEN  1989   W/ APPENDECTOMY   COCHLEAR IMPLANT Bilateral    Left/  2009;  Right / 2012   COLONOSCOPY WITH PROPOFOL   04/10/2022   by dr h. danis   CYSTOSCOPY N/A 03/06/2022   Procedure: CYSTOSCOPY With biopsy;  Surgeon: Marilynne Rosaline SAILOR, MD;  Location: [facility];  Service: Gynecology;  Laterality: N/A;  total time requested is 30 min   ESOPHAGOGASTRODUODENOSCOPY  02/03/2014   by dr d. brodie   INTERSTIM IMPLANT PLACEMENT N/A 05/19/2022   Procedure: RENNA IMPLANT FIRST STAGE;  Surgeon:  Marilynne Rosaline SAILOR, MD;  Location[facility];  Service: Gynecology;  Laterality: N/A;   INTERSTIM IMPLANT PLACEMENT N/A 06/02/2022   Procedure: RENNA IMPLANT SECOND STAGE;  Surgeon: Marilynne Rosaline SAILOR, MD;  Location: [facility];  Service: Gynecology;  Laterality: N/A;   LAPAROSCOPIC SIGMOID COLECTOMY  02/2011   in [place];    with takedown of splenic flexure; cystoscopy with bilateral ureteral stent placement   REDUCTION MAMMAPLASTY Bilateral 1989   [place]   TOTAL THYROIDECTOMY Bilateral 1995   WISDOM TOOTH EXTRACTION     yrs ago    Current Outpatient Medications  Medication Sig Dispense Refill   Alirocumab  (PRALUENT ) 75 MG/ML SOAJ Inject 1 mL (75 mg total) into the skin every 14 (fourteen) days. 2 mL 11   ascorbic acid (VITAMIN C) 1000 MG tablet Take 500 mg by mouth daily.      aspirin 81 MG chewable tablet Chew 81 mg by mouth daily.      bimatoprost (LUMIGAN) 0.01 % SOLN Place 1 drop into both eyes at bedtime.     CALCIUM PO Take 1 tablet by mouth daily.     Cholecalciferol (VITAMIN D3 PO) Take 1 tablet by mouth daily.     Cyanocobalamin  (VITAMIN B-12 PO) Take 1 tablet by mouth daily.     fluticasone (FLONASE) 50 MCG/ACT nasal spray Place 2 sprays into both nostrils at bedtime.     hydrochlorothiazide  (HYDRODIURIL ) 25 MG tablet Take 1 tablet (25 mg total) by mouth daily. 90 tablet 3   ibuprofen  (ADVIL ) 600 MG tablet Take 1 tablet (600 mg total) by mouth every 6 (six) hours as needed. 30 tablet 0   levothyroxine  (SYNTHROID ) 112 MCG tablet Take 1 tablet (112 mcg total) by mouth daily. 90 tablet 3   losartan  (COZAAR ) 100 MG tablet TAKE 1 TABLET(100 MG) BY MOUTH DAILY 90 tablet 3   meclizine  (ANTIVERT ) 25 MG tablet Take 1 tablet (25 mg total) by mouth 3 (three) times daily as needed for dizziness. 90 tablet 9   omega-3 acid ethyl esters (LOVAZA ) 1 g capsule Take 2 capsules (2 g total) by mouth 2 (two) times daily. 360 capsule 3   Probiotic Product  (PROBIOTIC ADVANCED PO) Take 1 capsule by mouth daily.     No current facility-administered medications for this visit.    Allergies as of 09/05/2024 - Review Complete 06/13/2024  Allergen Reaction Noted   Morphine Nausea And Vomiting 07/19/2020   Morphine and codeine  05/24/2014   Repatha  [evolocumab ] Other (See Comments) 11/02/2023   Statins Other (See Comments) 08/16/2013   Tape Other (See Comments) 04/25/2015   Wellbutrin [bupropion] Other (See Comments) 02/13/2015   Denture adhesive Rash 07/19/2020    Family History  Problem Relation Age of Onset   Hypertension Mother    Hypertension Father    Hypertension Sister    Thyroid  disease Sister    Colon cancer Maternal Aunt  Prostate cancer Paternal Uncle    Colon cancer Cousin        x 2; paternal   Prostate cancer Brother    Hyperlipidemia Brother    Colon polyps Neg Hx    Esophageal cancer Neg Hx    Stomach cancer Neg Hx    Rectal cancer Neg Hx    Breast cancer Neg Hx     Social History   Tobacco Use   Smoking status: Former    Current packs/day: 0.00    Average packs/day: 0.5 packs/day for 41.0 years (20.5 ttl pk-yrs)    Types: Cigarettes    Start date: 07/26/1980    Quit date: 07/26/2021    Years since quitting: 3.1   Smokeless tobacco: Never   Tobacco comments:    Quit 4 months ago, 1/2 pack a day [facility] 11/01/21  Vaping Use   Vaping status: Never Used  Substance Use Topics   Alcohol use: Not Currently    Comment: very rare   Drug use: Never     Review of Systems:    Constitutional: No weight loss, fever, chills, weakness or fatigue Eyes: No change in vision Ears, Nose, Throat:  No change in hearing or congestion Skin: No rash or itching Cardiovascular: No chest pain, chest pressure or palpitations   Respiratory: No SOB or cough Gastrointestinal: See HPI and otherwise negative Genitourinary: No dysuria or change in urinary frequency Neurological: No headache, dizziness or syncope Musculoskeletal: No  new muscle or joint pain Hematologic: No bleeding or bruising    Physical Exam:  Vital signs: There were no vitals taken for this visit.  Constitutional: NAD, Well developed, Well nourished, alert and cooperative Head:  Normocephalic and atraumatic.  Eyes: No scleral icterus. Conjunctiva pink. Mouth: No oral lesions. Respiratory: Respirations even and unlabored. Lungs clear to auscultation bilaterally.  No wheezes, crackles, or rhonchi.  Cardiovascular:  Regular rate and rhythm. No murmurs. No peripheral edema. Gastrointestinal:  Soft, nondistended, nontender. No rebound or guarding. Normal bowel sounds. No appreciable masses or hepatomegaly. Rectal:  Not performed.  Neurologic:  Alert and oriented x4;  grossly normal neurologically.  Skin:   Dry and intact without significant lesions or rashes. Psychiatric: Oriented to person, place and time. Demonstrates good judgement and reason without abnormal affect or behaviors.   RELEVANT LABS AND IMAGING: CBC    Component Value Date/Time   WBC 7.9 01/27/2024 1149   WBC 10.7 (H) 08/06/2021 1208   RBC 5.06 01/27/2024 1149   RBC 5.25 (H) 08/06/2021 1208   HGB 14.7 01/27/2024 1149   HCT 44.8 01/27/2024 1149   PLT 246 01/27/2024 1149   MCV 89 01/27/2024 1149   MCH 29.1 01/27/2024 1149   MCH 29.0 08/15/2019 0832   MCHC 32.8 01/27/2024 1149   MCHC 33.2 08/06/2021 1208   RDW 12.6 01/27/2024 1149   LYMPHSABS 2.6 01/27/2024 1149   MONOABS 0.9 08/06/2021 1208   EOSABS 0.2 01/27/2024 1149   BASOSABS 0.0 01/27/2024 1149    CMP     Component Value Date/Time   NA 145 (H) 01/27/2024 1149   K 3.9 01/27/2024 1149   CL 103 01/27/2024 1149   CO2 26 01/27/2024 1149   GLUCOSE 79 01/27/2024 1149   GLUCOSE 99 06/02/2022 0637   BUN 24 01/27/2024 1149   CREATININE 0.93 01/27/2024 1149   CREATININE 0.89 08/15/2019 0832   CALCIUM 10.5 (H) 01/27/2024 1149   PROT 7.6 01/27/2024 1149   ALBUMIN 4.3 01/27/2024 1149   AST 18 01/27/2024  1149   AST  17 08/15/2019 0832   ALT 12 01/27/2024 1149   ALT 14 08/15/2019 0832   ALKPHOS 95 01/27/2024 1149   BILITOT 0.4 01/27/2024 1149   BILITOT 0.5 08/15/2019 0832   GFRNONAA >60 08/15/2019 0832   GFRAA >60 08/15/2019 0832   Echocardiogram 10/30/2023 1. Left ventricular ejection fraction, by estimation, is > 75% . The left ventricle has hyperdynamic function. The left ventricle has no regional wall motion abnormalities. Left ventricular diastolic parameters are consistent with Grade I diastolic dysfunction ( impaired relaxation) . The average left ventricular global longitudinal strain is - 20. 2 % . The global longitudinal strain is normal.  2. Right ventricular systolic function is normal. The right ventricular size is normal.  3. The mitral valve is normal in structure. No evidence of mitral valve regurgitation. No evidence of mitral stenosis.  4. The aortic valve is tricuspid. Aortic valve regurgitation is trivial. No aortic stenosis is present.  5. Aortic dilatation noted. There is borderline dilatation of the aortic root, measuring 38 mm. There is mild dilatation of the ascending aorta, measuring 41 mm. 6. The inferior vena cava is normal in size with greater than 50% respiratory variability, suggesting right atrial pressure of 3 mmHg.  Assessment/Plan:   If rectal prolapse needs referral to surgery    Camie Furbish, PA-C [facility] 09/04/2024, 8:34 PM  Patient Care Team: Dwight Trula SQUIBB, MD as PCP - General (Internal Medicine) Shlomo Wilbert SAUNDERS, MD as PCP - Sleep Medicine (Cardiology) Jeffrie Oneil BROCKS, MD as PCP - Cardiology (Cardiology) Obie Princella HERO, MD (Inactive) as Consulting Physician (Gastroenterology) Bonner Ade, MD as Consulting Physician (Physical Medicine and Rehabilitation) Micky Selinda SAILOR, MD as Referring Physician (Cardiology) Kassie Mallick, MD (Inactive) as Consulting Physician (Endocrinology) Thaddeus Locus, MD as Consulting Physician (Otolaryngology)

## 2024-09-05 ENCOUNTER — Encounter: Payer: Self-pay | Admitting: Gastroenterology

## 2024-09-05 ENCOUNTER — Ambulatory Visit (INDEPENDENT_AMBULATORY_CARE_PROVIDER_SITE_OTHER): Admitting: Gastroenterology

## 2024-09-05 VITALS — BP 118/70 | HR 80 | Ht 59.75 in | Wt 130.4 lb

## 2024-09-05 DIAGNOSIS — R159 Full incontinence of feces: Secondary | ICD-10-CM

## 2024-09-05 DIAGNOSIS — K644 Residual hemorrhoidal skin tags: Secondary | ICD-10-CM | POA: Diagnosis not present

## 2024-09-05 MED ORDER — HYDROCORTISONE (PERIANAL) 2.5 % EX CREA: 1.0000 | TOPICAL_CREAM | Freq: Two times a day (BID) | CUTANEOUS | 0 refills | Status: AC

## 2024-09-05 NOTE — Patient Instructions (Signed)
 We have sent the following medications to your pharmacy for you to pick up at your convenience: Hydrocortisone  cream  Continue High Fiber Diet  Continue Metamucil  AVOID Straining  Call if symptoms worsen   Due to recent changes in healthcare laws, you may see the results of your imaging and laboratory studies on MyChart before your provider has had a chance to review them.  We understand that in some cases there may be results that are confusing or concerning to you. Not all laboratory results come back in the same time frame and the provider may be waiting for multiple results in order to interpret others.  Please give us  48 hours in order for your provider to thoroughly review all the results before contacting the office for clarification of your results.    _______________________________________________________  If your blood pressure at your visit was 140/90 or greater, please contact your primary care physician to follow up on this.  _______________________________________________________  If you are age 77 or older, your body mass index should be between 23-30. Your Body mass index is 25.68 kg/m. If this is out of the aforementioned range listed, please consider follow up with your Primary Care Provider.  If you are age 77 or younger, your body mass index should be between 19-25. Your Body mass index is 25.68 kg/m. If this is out of the aformentioned range listed, please consider follow up with your Primary Care Provider.   ________________________________________________________  The [facility] providers would like to encourage you to use MYCHART to communicate with providers for non-urgent requests or questions.  Due to long hold times on the telephone, sending your provider a message by [facility] may be a faster and more efficient way to get a response.  Please allow 48 business hours for a response.  Please remember that this is for non-urgent requests.   _______________________________________________________  [facility] is using a team-based approach to care.  Your team is made up of your doctor and two to three APPS. Our APPS (Nurse Practitioners and Physician Assistants) work with your physician to ensure care continuity for you. They are fully qualified to address your health concerns and develop a treatment plan. They communicate directly with your gastroenterologist to care for you. Seeing the Advanced Practice Practitioners on your physician's team can help you by facilitating care more promptly, often allowing for earlier appointments, access to diagnostic testing, procedures, and other specialty referrals.    I appreciate the  opportunity to care for you  Thank You   Camie Heinz,PA-C

## 2024-09-06 DIAGNOSIS — G5601 Carpal tunnel syndrome, right upper limb: Secondary | ICD-10-CM | POA: Diagnosis not present

## 2024-09-07 NOTE — Progress Notes (Signed)
 ____________________________________________________________  Attending physician addendum:  Thank you for sending this case to me. I have reviewed the entire note and agree with the plan.   Victory Brand, MD  ____________________________________________________________

## 2024-09-14 DIAGNOSIS — H04123 Dry eye syndrome of bilateral lacrimal glands: Secondary | ICD-10-CM | POA: Diagnosis not present

## 2024-09-14 DIAGNOSIS — H401131 Primary open-angle glaucoma, bilateral, mild stage: Secondary | ICD-10-CM | POA: Diagnosis not present

## 2024-09-14 DIAGNOSIS — Z961 Presence of intraocular lens: Secondary | ICD-10-CM | POA: Diagnosis not present

## 2024-09-15 DIAGNOSIS — F4329 Adjustment disorder with other symptoms: Secondary | ICD-10-CM | POA: Diagnosis not present

## 2024-09-15 DIAGNOSIS — I10 Essential (primary) hypertension: Secondary | ICD-10-CM | POA: Diagnosis not present

## 2024-09-15 DIAGNOSIS — Z23 Encounter for immunization: Secondary | ICD-10-CM | POA: Diagnosis not present

## 2024-09-15 DIAGNOSIS — Z9621 Cochlear implant status: Secondary | ICD-10-CM | POA: Diagnosis not present

## 2024-09-15 DIAGNOSIS — E890 Postprocedural hypothyroidism: Secondary | ICD-10-CM | POA: Diagnosis not present

## 2024-10-04 DIAGNOSIS — G5601 Carpal tunnel syndrome, right upper limb: Secondary | ICD-10-CM | POA: Diagnosis not present

## 2024-10-17 NOTE — Progress Notes (Unsigned)
 CORTNI TAYS [id number] 1946-10-18   Chief Complaint:  Referring Provider: Dwight Trula SHAUNNA, MD Primary GI MD: Dr. Legrand  HPI: FERN CANOVA is a 78 y.o. female with past medical history of BPPV, cochlear implant, GERD, hiatal hernia, history of adenomatous colon polyps, history of diverticulitis s/p laparoscopic sigmoid colectomy, history of H. pylori infection, history of kidney stones, HLD, HTN, hypothyroidism s/p thyroidectomy for goiter, OSA on CPAP, emphysema, thoracic ascending aortic aneurysm, cholecystectomy, hysterectomy, anorectal manometry 2017 who presents today for a complaint of *** .    Seen in office 08/22/2020 at which time reported several year history of intermittent fecal incontinence secondary to very low anal sphincter resting and squeeze pressures and incomplete relaxation of the anal sphincter.  She had improvement with pelvic floor physical therapy and had been managing well, but developed low back pain and since that time had increase in fecal incontinence.  Opted for nonsurgical management and physical therapy with improvement in back pain and fecal incontinence.   Per neurology note 01/08/2024, patient has history of OAB and bowel leakage and is s/p sacral neuromodulation (InterStim) stage I on 05/19/2022 and stage II on 06/02/2022.  Per note, device has been working well for her.  Endorsed having to sometimes strain with bowel movements and was using Metamucil but not regularly.  Endorsed having a large hemorrhoid and was advised to follow-up with GI.  Seen in office 09/05/2024 for evaluation of hemorrhoid and fecal incontinence.  Reported that recently she was having some looser stools and incontinence and increased her InterStim frequency, which resulted in her having to strain with bowel movements.  Subsequently developed some rectal pain and noticed a hemorrhoid with wiping.  Had been using OTC Preparation H and baby wipes to help with discomfort.  Had some improvement  in discomfort and size of hemorrhoid but still felt she needed alternative treatment.  On exam was noted to have a nonbleeding, nonthrombosed external hemorrhoid which was tender to palpation. She was prescribed topical hydrocortisone  cream 2.5% to apply twice daily for 7 days, advised to continue high-fiber diet and Metamucil, do sitz bath's, and follow-up.    Discussed the use of AI scribe software for clinical note transcription with the patient, who gave verbal consent to proceed.  History of Present Illness       Previous GI Procedures/Imaging   Colonoscopy 04/10/2022 - Decreased sphincter tone found on digital rectal exam.  - Diverticulosis in the left colon.  - The examination was otherwise normal on direct and retroflexion views.  - No specimens collected. - No recall due to age   Past Medical History:  Diagnosis Date   BPPV (benign paroxysmal positional vertigo)    neurologist--- dr ines   Cochlear implant in place    bilateral   Coronary artery calcification    cardiologist--- dr jeffrie   DDD (degenerative disc disease), lumbosacral    Fecal incontinence    intermittant , wears depends   Genital herpes    GERD (gastroesophageal reflux disease)    Glaucoma, both eyes    Hiatal hernia    History of adenomatous polyp of colon    History of diverticulitis of colon    w/ hx sugical intervention hemicolectomy in 2012   History of Helicobacter pylori infection    History of kidney stones    Hyperlipidemia, mixed    Hypertension    Hypothyroidism, postsurgical    endocrinologist-- dr kassie;   s/p total thyroidectomy for goiter, per dr kassie  note benign   Incontinence of urine in female    wears depends   Lung nodule seen on imaging study    followed by dr geronimo;  per lov note chronic stable, LLL   Lymphadenopathy    followed by pcp;  evaluated by oncologist-- dr onesimo ronco note 08-22-2019 in epic   Meniere's disease    OAB (overactive bladder)    OSA on  CPAP    followed by dr t. shlomo;   sleep study in epic 10-22-2018  moderate osa   Pulmonary emphysema [facility])    pulmonology--- dr christella. geronimo   Sensorineural hearing loss (SNHL) of both ears    s/p cochlear implant's   SOB (shortness of breath)    Thoracic ascending aortic aneurysm    followed by dr lucas--- 4.1 cm per 01-21-2022 chest ct   Wears glasses    Wears partial dentures upper and lower     Past Surgical History:  Procedure Laterality Date   ABDOMINAL HYSTERECTOMY  1995   ANAL RECTAL MANOMETRY N/A 08/06/2016   Procedure: ANO RECTAL MANOMETRY;  Surgeon: Victory LITTIE Legrand DOUGLAS, MD;  Location: [facility];  Service: Gastroenterology;  Laterality: N/A;   CATARACT EXTRACTION W/ INTRAOCULAR LENS IMPLANT Bilateral    yrs ago   CHOLECYSTECTOMY OPEN  1989   W/ APPENDECTOMY   COCHLEAR IMPLANT Bilateral    Left/  2009;  Right / 2012   COLONOSCOPY WITH PROPOFOL   04/10/2022   by dr h. danis   CYSTOSCOPY N/A 03/06/2022   Procedure: CYSTOSCOPY With biopsy;  Surgeon: Marilynne Rosaline SAILOR, MD;  Location: [facility];  Service: Gynecology;  Laterality: N/A;  total time requested is 30 min   ESOPHAGOGASTRODUODENOSCOPY  02/03/2014   by dr d. brodie   INTERSTIM IMPLANT PLACEMENT N/A 05/19/2022   Procedure: RENNA IMPLANT FIRST STAGE;  Surgeon: Marilynne Rosaline SAILOR, MD;  Location: [facility];  Service: Gynecology;  Laterality: N/A;   INTERSTIM IMPLANT PLACEMENT N/A 06/02/2022   Procedure: RENNA IMPLANT SECOND STAGE;  Surgeon: Marilynne Rosaline SAILOR, MD;  Location: [facility];  Service: Gynecology;  Laterality: N/A;   LAPAROSCOPIC SIGMOID COLECTOMY  02/2011   in [place];    with takedown of splenic flexure; cystoscopy with bilateral ureteral stent placement   REDUCTION MAMMAPLASTY Bilateral 1989   [place]   TOTAL THYROIDECTOMY Bilateral 1995   WISDOM TOOTH EXTRACTION     yrs ago    Current Outpatient Medications  Medication Sig Dispense Refill    Alirocumab  (PRALUENT ) 75 MG/ML SOAJ Inject 1 mL (75 mg total) into the skin every 14 (fourteen) days. 2 mL 11   ascorbic acid (VITAMIN C) 1000 MG tablet Take 500 mg by mouth daily.      aspirin 81 MG chewable tablet Chew 81 mg by mouth daily.      bimatoprost (LUMIGAN) 0.01 % SOLN Place 1 drop into both eyes at bedtime.     CALCIUM PO Take 1 tablet by mouth daily.     Cholecalciferol (VITAMIN D3 PO) Take 1 tablet by mouth daily.     Cyanocobalamin  (VITAMIN B-12 PO) Take 1 tablet by mouth daily.     fluticasone (FLONASE) 50 MCG/ACT nasal spray Place 2 sprays into both nostrils at bedtime.     hydrochlorothiazide  (HYDRODIURIL ) 25 MG tablet Take 1 tablet (25 mg total) by mouth daily. 90 tablet 3   Hydrocortisone  (PREPARATION H EX) Apply 1 Application topically as needed (with every BM).  ibuprofen  (ADVIL ) 600 MG tablet Take 1 tablet (600 mg total) by mouth every 6 (six) hours as needed. 30 tablet 0   losartan  (COZAAR ) 100 MG tablet TAKE 1 TABLET(100 MG) BY MOUTH DAILY 90 tablet 3   meclizine  (ANTIVERT ) 25 MG tablet Take 1 tablet (25 mg total) by mouth 3 (three) times daily as needed for dizziness. 90 tablet 9   METAMUCIL FIBER PO Take 1 Dose by mouth 2 (two) times daily.     omega-3 acid ethyl esters (LOVAZA ) 1 g capsule Take 2 capsules (2 g total) by mouth 2 (two) times daily. 360 capsule 3   Probiotic Product (PROBIOTIC ADVANCED PO) Take 1 capsule by mouth daily.     SYNTHROID  100 MCG tablet Take 100 mcg by mouth daily before breakfast.     No current facility-administered medications for this visit.    Allergies as of 10/18/2024 - Review Complete 09/05/2024  Allergen Reaction Noted   Morphine Nausea And Vomiting 07/19/2020   Morphine and codeine  05/24/2014   Repatha  [evolocumab ] Other (See Comments) 11/02/2023   Statins Other (See Comments) 08/16/2013   Tape Other (See Comments) 04/25/2015   Wellbutrin [bupropion] Other (See Comments) 02/13/2015   Denture adhesive Rash 07/19/2020     Family History  Problem Relation Age of Onset   Hypertension Mother    Hypertension Father    Hypertension Sister    Thyroid  disease Sister    Colon cancer Maternal Aunt    Prostate cancer Paternal Uncle    Colon cancer Cousin        x 2; paternal   Prostate cancer Brother    Hyperlipidemia Brother    Colon polyps Neg Hx    Esophageal cancer Neg Hx    Stomach cancer Neg Hx    Rectal cancer Neg Hx    Breast cancer Neg Hx     Social History   Tobacco Use   Smoking status: Former    Current packs/day: 0.00    Average packs/day: 0.5 packs/day for 41.0 years (20.5 ttl pk-yrs)    Types: Cigarettes    Start date: 07/26/1980    Quit date: 07/26/2021    Years since quitting: 3.2   Smokeless tobacco: Never   Tobacco comments:    Quit 4 months ago, 1/2 pack a day [facility] 11/01/21  Vaping Use   Vaping status: Never Used  Substance Use Topics   Alcohol use: Not Currently    Comment: very rare   Drug use: Never     Review of Systems:    Constitutional: No weight loss, fever, chills, weakness or fatigue Eyes: No change in vision Ears, Nose, Throat:  No change in hearing or congestion Skin: No rash or itching Cardiovascular: No chest pain, chest pressure or palpitations   Respiratory: No SOB or cough Gastrointestinal: See HPI and otherwise negative Genitourinary: No dysuria or change in urinary frequency Neurological: No headache, dizziness or syncope Musculoskeletal: No new muscle or joint pain Hematologic: No bleeding or bruising    Physical Exam:  Vital signs: There were no vitals taken for this visit.  Constitutional: NAD, Well developed, Well nourished, alert and cooperative Head:  Normocephalic and atraumatic.  Eyes: No scleral icterus. Conjunctiva pink. Mouth: No oral lesions. Respiratory: Respirations even and unlabored. Lungs clear to auscultation bilaterally.  No wheezes, crackles, or rhonchi.  Cardiovascular:  Regular rate and rhythm. No murmurs. No peripheral  edema. Gastrointestinal:  Soft, nondistended, nontender. No rebound or guarding. Normal bowel sounds. No appreciable masses  or hepatomegaly. Rectal:  Not performed.  Neurologic:  Alert and oriented x4;  grossly normal neurologically.  Skin:   Dry and intact without significant lesions or rashes. Psychiatric: Oriented to person, place and time. Demonstrates good judgement and reason without abnormal affect or behaviors.   RELEVANT LABS AND IMAGING: CBC    Component Value Date/Time   WBC 7.9 01/27/2024 1149   WBC 10.7 (H) 08/06/2021 1208   RBC 5.06 01/27/2024 1149   RBC 5.25 (H) 08/06/2021 1208   HGB 14.7 01/27/2024 1149   HCT 44.8 01/27/2024 1149   PLT 246 01/27/2024 1149   MCV 89 01/27/2024 1149   MCH 29.1 01/27/2024 1149   MCH 29.0 08/15/2019 0832   MCHC 32.8 01/27/2024 1149   MCHC 33.2 08/06/2021 1208   RDW 12.6 01/27/2024 1149   LYMPHSABS 2.6 01/27/2024 1149   MONOABS 0.9 08/06/2021 1208   EOSABS 0.2 01/27/2024 1149   BASOSABS 0.0 01/27/2024 1149    CMP     Component Value Date/Time   NA 145 (H) 01/27/2024 1149   K 3.9 01/27/2024 1149   CL 103 01/27/2024 1149   CO2 26 01/27/2024 1149   GLUCOSE 79 01/27/2024 1149   GLUCOSE 99 06/02/2022 0637   BUN 24 01/27/2024 1149   CREATININE 0.93 01/27/2024 1149   CREATININE 0.89 08/15/2019 0832   CALCIUM 10.5 (H) 01/27/2024 1149   PROT 7.6 01/27/2024 1149   ALBUMIN 4.3 01/27/2024 1149   AST 18 01/27/2024 1149   AST 17 08/15/2019 0832   ALT 12 01/27/2024 1149   ALT 14 08/15/2019 0832   ALKPHOS 95 01/27/2024 1149   BILITOT 0.4 01/27/2024 1149   BILITOT 0.5 08/15/2019 0832   GFRNONAA >60 08/15/2019 0832   GFRAA >60 08/15/2019 0832   Echocardiogram 10/30/2023 1. Left ventricular ejection fraction, by estimation, is > 75% . The left ventricle has hyperdynamic function. The left ventricle has no regional wall motion abnormalities. Left ventricular diastolic parameters are consistent with Grade I diastolic dysfunction (  impaired relaxation) . The average left ventricular global longitudinal strain is - 20. 2 % . The global longitudinal strain is normal.  2. Right ventricular systolic function is normal. The right ventricular size is normal.  3. The mitral valve is normal in structure. No evidence of mitral valve regurgitation. No evidence of mitral stenosis.  4. The aortic valve is tricuspid. Aortic valve regurgitation is trivial. No aortic stenosis is present.  5. Aortic dilatation noted. There is borderline dilatation of the aortic root, measuring 38 mm. There is mild dilatation of the ascending aorta, measuring 41 mm. 6. The inferior vena cava is normal in size with greater than 50% respiratory variability, suggesting right atrial pressure of 3 mmHg.  Assessment/Plan:       Camie Furbish, PA-C [facility] 10/17/2024, 8:41 PM  Patient Care Team: Dwight Trula SQUIBB, MD as PCP - General (Internal Medicine) Shlomo Wilbert SAUNDERS, MD as PCP - Sleep Medicine (Cardiology) Jeffrie Oneil BROCKS, MD as PCP - Cardiology (Cardiology) Obie Princella HERO, MD (Inactive) as Consulting Physician (Gastroenterology) Bonner Ade, MD as Consulting Physician (Physical Medicine and Rehabilitation) Micky Selinda SAILOR, MD as Referring Physician (Cardiology) Kassie Mallick, MD (Inactive) as Consulting Physician (Endocrinology) Thaddeus Locus, MD as Consulting Physician (Otolaryngology)

## 2024-10-18 ENCOUNTER — Ambulatory Visit: Admitting: Gastroenterology

## 2024-10-18 ENCOUNTER — Encounter: Payer: Self-pay | Admitting: Gastroenterology

## 2024-10-18 VITALS — BP 110/60 | HR 84 | Ht 59.75 in | Wt 131.0 lb

## 2024-10-18 DIAGNOSIS — K649 Unspecified hemorrhoids: Secondary | ICD-10-CM

## 2024-10-18 DIAGNOSIS — R159 Full incontinence of feces: Secondary | ICD-10-CM | POA: Diagnosis not present

## 2024-10-18 MED ORDER — HYDROCORTISONE (PERIANAL) 2.5 % EX CREA: 1.0000 | TOPICAL_CREAM | Freq: Two times a day (BID) | CUTANEOUS | 1 refills | Status: AC

## 2024-10-18 NOTE — Patient Instructions (Signed)
 _______________________________________________________  If your blood pressure at your visit was 140/90 or greater, please contact your primary care physician to follow up on this.  _______________________________________________________  If you are age 77 or older, your body mass index should be between 23-30. Your Body mass index is 25.8 kg/m. If this is out of the aforementioned range listed, please consider follow up with your Primary Care Provider.  If you are age 54 or younger, your body mass index should be between 19-25. Your Body mass index is 25.8 kg/m. If this is out of the aformentioned range listed, please consider follow up with your Primary Care Provider.   ________________________________________________________  The [facility] providers would like to encourage you to use MYCHART to communicate with providers for non-urgent requests or questions.  Due to long hold times on the telephone, sending your provider a message by [facility] may be a faster and more efficient way to get a response.  Please allow 48 business hours for a response.  Please remember that this is for non-urgent requests.  _______________________________________________________  [facility] is using a team-based approach to care.  Your team is made up of your doctor and two to three APPS. Our APPS (Nurse Practitioners and Physician Assistants) work with your physician to ensure care continuity for you. They are fully qualified to address your health concerns and develop a treatment plan. They communicate directly with your gastroenterologist to care for you. Seeing the Advanced Practice Practitioners on your physician's team can help you by facilitating care more promptly, often allowing for earlier appointments, access to diagnostic testing, procedures, and other specialty referrals.

## 2024-10-20 NOTE — Progress Notes (Signed)
 ____________________________________________________________  Attending physician addendum:  Thank you for sending this case to me. I have reviewed the entire note and agree with the plan.   Victory Brand, MD  ____________________________________________________________

## 2024-10-25 ENCOUNTER — Encounter: Payer: Self-pay | Admitting: Adult Health

## 2024-10-25 ENCOUNTER — Ambulatory Visit: Payer: Medicare Other | Admitting: Adult Health

## 2024-10-25 VITALS — BP 123/76 | HR 82 | Ht 60.0 in | Wt 131.0 lb

## 2024-10-25 DIAGNOSIS — Z820 Family history of epilepsy and other diseases of the nervous system: Secondary | ICD-10-CM

## 2024-10-25 DIAGNOSIS — R413 Other amnesia: Secondary | ICD-10-CM | POA: Diagnosis not present

## 2024-10-25 NOTE — Patient Instructions (Signed)
 Your Plan:  Continue to monitor memory  Blood work today If your symptoms worsen or you develop new symptoms please let us  know.   Thank you for coming to see us  at [facility]. I hope we have been able to provide you high quality care today.  You may receive a patient satisfaction survey over the next few weeks. We would appreciate your feedback and comments so that we may continue to improve ourselves and the health of our patients.

## 2024-10-25 NOTE — Progress Notes (Signed)
 PATIENT: Rachel Gardner Alexanders DOB: 10-17-1946  REASON FOR VISIT: follow up HISTORY FROM: patient   Chief Complaint  Patient presents with   Follow-up    Pt in 5 alone Pt here for memory f/u Pt states memory is better      HISTORY OF PRESENT ILLNESS: Today 10/25/24   Rachel Gardner Dueitt is a 78 y.o. female who has been followed in this office for memory disturbance. Returns today for follow-up. SHe feels that her memory is better. She lives alone but son lives beside her. She is able to complete all ADLs independently. She manages her own medications, appointments and fiances. Reports that sister has memory issues (in [place]) she states that was stressful for her- she feels this may have precipitated some of her symptoms. This year she feels that she is better. She has neuropsychological testing in April- that showed insufficent data to support a formal memory diagnosis but she did have cognitive concerns. It was recommended.  Patient does state that she has some psychological issues from her past that she needs to deal with.  Plans to see a psychiatrist.    HISTORY 01/27/2024: She went to [place] in July and her sister has FTD. She had a fall and broke 2 ribs, stress, it hurt. One day his some told her he felt she didn't come home from [place] as well as she was she forgot recent conversations, she has left her purse behind 3 times. She has a 29/30 on MoCA. She has OSA and is on cpap, she is compliant. Since she has come back from [place] feel more short-term memory. She lives adjacent to her son. She still drives, still goes grocery shopping, she becomes anxious driving at night so she doesn't so it. She pays her own bills, she is very organized, live independently, she cares for her dogs, not gettig lost. She goes to [employer]. She may forget some items but them remebers them no hx of alzheimers. Sister g3 with FTD. Her sister started with behavioral problems, behavioral varant FTD. She has also been  stressed and worried since then. No FHx of dementia.  She can't remember things she gets up to do it and then forgets She has to leave home and write done a task of things to do but still forgets. In [place] she was raining her siblings. She has had a great memory. Denies stress now, no depression, no financial problems, she is anxious about her memory but no other anxieties, she knows there is normal cognitive aging but she has always had a great memory. Siter 3 years older no memory. Parents past away at 63 and 64 no memory problems.   REVIEW OF SYSTEMS: Out of a complete 14 system review of symptoms, the patient complains only of the following symptoms, and all other reviewed systems are negative.   Listed in HPI  ALLERGIES: Allergies  Allergen Reactions   Morphine Nausea And Vomiting   Morphine And Codeine     Nausea Can tolerate Dilaudid   Repatha  [Evolocumab ] Other (See Comments)    Body aches   Statins Other (See Comments)    Joint pain    Tape Other (See Comments)    Causes redness and will take skin when removed   Wellbutrin [Bupropion] Other (See Comments)    Suicidal intensions   Denture Adhesive Rash    HOME MEDICATIONS: Outpatient Medications Prior to Visit  Medication Sig Dispense Refill   Alirocumab  (PRALUENT ) 75 MG/ML SOAJ Inject 1  mL (75 mg total) into the skin every 14 (fourteen) days. 2 mL 11   ascorbic acid (VITAMIN C) 1000 MG tablet Take 500 mg by mouth daily.      aspirin 81 MG chewable tablet Chew 81 mg by mouth daily.      bimatoprost (LUMIGAN) 0.01 % SOLN Place 1 drop into both eyes at bedtime.     CALCIUM PO Take 1 tablet by mouth daily.     Cholecalciferol (VITAMIN D3 PO) Take 1 tablet by mouth daily.     Cyanocobalamin  (VITAMIN B-12 PO) Take 1 tablet by mouth daily.     hydrochlorothiazide  (HYDRODIURIL ) 25 MG tablet Take 1 tablet (25 mg total) by mouth daily. 90 tablet 3   hydrocortisone  (ANUSOL -HC) 2.5 % rectal cream Place 1 Application rectally 2  (two) times daily. 30 g 1   ibuprofen  (ADVIL ) 600 MG tablet Take 1 tablet (600 mg total) by mouth every 6 (six) hours as needed. 30 tablet 0   losartan  (COZAAR ) 100 MG tablet TAKE 1 TABLET(100 MG) BY MOUTH DAILY 90 tablet 3   METAMUCIL FIBER PO Take 1 Dose by mouth 2 (two) times daily.     omega-3 acid ethyl esters (LOVAZA ) 1 g capsule Take 2 capsules (2 g total) by mouth 2 (two) times daily. 360 capsule 3   Probiotic Product (PROBIOTIC ADVANCED PO) Take 1 capsule by mouth daily.     SYNTHROID  100 MCG tablet Take 100 mcg by mouth daily before breakfast.     fluticasone (FLONASE) 50 MCG/ACT nasal spray Place 2 sprays into both nostrils at bedtime.     Hydrocortisone  (PREPARATION H EX) Apply 1 Application topically 2 (two) times daily.     meclizine  (ANTIVERT ) 25 MG tablet Take 1 tablet (25 mg total) by mouth 3 (three) times daily as needed for dizziness. 90 tablet 9   No facility-administered medications prior to visit.    PAST MEDICAL HISTORY: Past Medical History:  Diagnosis Date   BPPV (benign paroxysmal positional vertigo)    neurologist--- dr ines   Cochlear implant in place    bilateral   Coronary artery calcification    cardiologist--- dr jeffrie   DDD (degenerative disc disease), lumbosacral    Fecal incontinence    intermittant , wears depends   Genital herpes    GERD (gastroesophageal reflux disease)    Glaucoma, both eyes    Hiatal hernia    History of adenomatous polyp of colon    History of diverticulitis of colon    w/ hx sugical intervention hemicolectomy in 2012   History of Helicobacter pylori infection    History of kidney stones    Hyperlipidemia, mixed    Hypertension    Hypothyroidism, postsurgical    endocrinologist-- dr kassie;   s/p total thyroidectomy for goiter, per dr kassie note benign   Incontinence of urine in female    wears depends   Lung nodule seen on imaging study    followed by dr geronimo;  per lov note chronic stable, LLL    Lymphadenopathy    followed by pcp;  evaluated by oncologist-- dr onesimo ronco note 08-22-2019 in epic   Meniere's disease    OAB (overactive bladder)    OSA on CPAP    followed by dr t. shlomo;   sleep study in epic 10-22-2018  moderate osa   Pulmonary emphysema [facility])    pulmonology--- dr christella. geronimo   Sensorineural hearing loss (SNHL) of both ears    s/p cochlear  implant's   SOB (shortness of breath)    Thoracic ascending aortic aneurysm    followed by dr lucas--- 4.1 cm per 01-21-2022 chest ct   Wears glasses    Wears partial dentures upper and lower     PAST SURGICAL HISTORY: Past Surgical History:  Procedure Laterality Date   ABDOMINAL HYSTERECTOMY  1995   ANAL RECTAL MANOMETRY N/A 08/06/2016   Procedure: ANO RECTAL MANOMETRY;  Surgeon: Victory LITTIE Legrand DOUGLAS, MD;  Location: [facility];  Service: Gastroenterology;  Laterality: N/A;   CATARACT EXTRACTION W/ INTRAOCULAR LENS IMPLANT Bilateral    yrs ago   CHOLECYSTECTOMY OPEN  1989   W/ APPENDECTOMY   COCHLEAR IMPLANT Bilateral    Left/  2009;  Right / 2012   COLONOSCOPY WITH PROPOFOL   04/10/2022   by dr h. danis   CYSTOSCOPY N/A 03/06/2022   Procedure: CYSTOSCOPY With biopsy;  Surgeon: Marilynne Rosaline SAILOR, MD;  [facility];  Service: Gynecology;  Laterality: N/A;  total time requested is 30 min   ESOPHAGOGASTRODUODENOSCOPY  02/03/2014   by dr d. brodie   INTERSTIM IMPLANT PLACEMENT N/A 05/19/2022   Procedure: RENNA IMPLANT FIRST STAGE;  Surgeon: Marilynne Rosaline SAILOR, MD;  Location[facility];  Service: Gynecology;  Laterality: N/A;   INTERSTIM IMPLANT PLACEMENT N/A 06/02/2022   Procedure: RENNA IMPLANT SECOND STAGE;  Surgeon: Marilynne Rosaline SAILOR, MD;  Location: [facility];  Service: Gynecology;  Laterality: N/A;   LAPAROSCOPIC SIGMOID COLECTOMY  02/2011   in [place];    with takedown of splenic flexure; cystoscopy with bilateral ureteral stent placement   REDUCTION  MAMMAPLASTY Bilateral 1989   [place]   TOTAL THYROIDECTOMY Bilateral 1995   WISDOM TOOTH EXTRACTION     yrs ago    FAMILY HISTORY: Family History  Problem Relation Age of Onset   Hypertension Mother    Hypertension Father    Hypertension Sister    Alzheimer's disease Sister    Thyroid  disease Sister    Prostate cancer Brother    Hyperlipidemia Brother    Colon cancer Maternal Aunt    Prostate cancer Paternal Uncle    Colon cancer Cousin        x 2; paternal   Colon polyps Neg Hx    Esophageal cancer Neg Hx    Stomach cancer Neg Hx    Rectal cancer Neg Hx    Breast cancer Neg Hx     SOCIAL HISTORY: Social History   Socioeconomic History   Marital status: Divorced    Spouse name: Not on file   Number of children: 3   Years of education: Not on file   Highest education level: Not on file  Occupational History   Occupation: retired engineer, civil (consulting)  Tobacco Use   Smoking status: Former    Current packs/day: 0.00    Average packs/day: 0.5 packs/day for 41.0 years (20.5 ttl pk-yrs)    Types: Cigarettes    Start date: 07/26/1980    Quit date: 07/26/2021    Years since quitting: 3.2   Smokeless tobacco: Never   Tobacco comments:    Quit 4 months ago, 1/2 pack a day [facility] 11/01/21  Vaping Use   Vaping status: Never Used  Substance and Sexual Activity   Alcohol use: Not Currently    Comment: very rare   Drug use: Never   Sexual activity: Not on file  Other Topics Concern   Not on file  Social History Narrative  Regular exercise: walk; 1 mile a day / walk 5 miles weekend;    update 02/22/2020 not exercising   Caffeine use: 1-2 cups of coffee per day   3 children   Lives alone with her dogs, son lives next door   Social Drivers of Health   Financial Resource Strain: Not on file  Food Insecurity: Low Risk  (03/25/2024)   Received from [facility]   Hunger Vital Sign    Within the past 12 months, you worried that your food would run out before you got money to buy more:  Never true    Within the past 12 months, the food you bought just didn't last and you didn't have money to get more. : Never true  Transportation Needs: No Transportation Needs (03/25/2024)   Received from [facility]    In the past 12 months, has lack of reliable transportation kept you from medical appointments, meetings, work or from getting things needed for daily living? : No  Physical Activity: Not on file  Stress: Not on file  Social Connections: Not on file  Intimate Partner Violence: Not on file      PHYSICAL EXAM  Vitals:   10/25/24 1046  BP: 123/76  Pulse: 82  Weight: 131 lb (59.4 kg)  Height: 5' (1.524 m)   Body mass index is 25.58 kg/m.     01/27/2024   10:03 AM 10/08/2017    2:34 PM 04/25/2015    9:38 AM  MMSE - Mini Mental State Exam  Not completed:   Unable to complete  Orientation to time 5 5    Orientation to Place 5 5    Registration 3    Attention/ Calculation 5    Recall 3    Language- name 2 objects 2    Language- repeat 1    Language- follow 3 step command 2    Language- read & follow direction 1    Write a sentence 1    Copy design 1    Total score 29       Data saved with a previous flowsheet row definition      10/25/2024   10:48 AM 01/27/2024    1:16 PM  Montreal Cognitive Assessment   Visuospatial/ Executive (0/5) 3 2  Naming (0/3) 3 3  Attention: Read list of digits (0/2) 1 2  Attention: Read list of letters (0/1) 1 0  Attention: Serial 7 subtraction starting at 100 (0/3) 2 3  Language: Repeat phrase (0/2) 0 1  Language : Fluency (0/1) 0 1  Abstraction (0/2) 0 2  Delayed Recall (0/5) 0 5  Orientation (0/6) 5 6  Total 15 25     Generalized: Well developed, in no acute distress   Neurological examination  Mentation: Alert oriented to time, place, history taking. Follows all commands speech and language fluent Cranial nerve II-XII: Pupils were equal round reactive to light. Extraocular movements were full,  visual field were full on confrontational test. Facial sensation and strength were normal. Uvula tongue midline. Head turning and shoulder shrug  were normal and symmetric. Motor: The motor testing reveals 5 over 5 strength of all 4 extremities. Good symmetric motor tone is noted throughout.  Sensory: Sensory testing is intact to soft touch on all 4 extremities. No evidence of extinction is noted.  Coordination: Cerebellar testing reveals good finger-nose-finger and heel-to-shin bilaterally.  Gait and station: Gait is normal. Reflexes: Deep tendon reflexes are symmetric and normal bilaterally.  DIAGNOSTIC DATA (LABS, IMAGING, TESTING) - I reviewed patient records, labs, notes, testing and imaging myself where available.  Lab Results  Component Value Date   WBC 7.9 01/27/2024   HGB 14.7 01/27/2024   HCT 44.8 01/27/2024   MCV 89 01/27/2024   PLT 246 01/27/2024      Component Value Date/Time   NA 145 (H) 01/27/2024 1149   K 3.9 01/27/2024 1149   CL 103 01/27/2024 1149   CO2 26 01/27/2024 1149   GLUCOSE 79 01/27/2024 1149   GLUCOSE 99 06/02/2022 0637   BUN 24 01/27/2024 1149   CREATININE 0.93 01/27/2024 1149   CREATININE 0.89 08/15/2019 0832   CALCIUM 10.5 (H) 01/27/2024 1149   PROT 7.6 01/27/2024 1149   ALBUMIN 4.3 01/27/2024 1149   AST 18 01/27/2024 1149   AST 17 08/15/2019 0832   ALT 12 01/27/2024 1149   ALT 14 08/15/2019 0832   ALKPHOS 95 01/27/2024 1149   BILITOT 0.4 01/27/2024 1149   BILITOT 0.5 08/15/2019 0832   GFRNONAA >60 08/15/2019 0832   GFRAA >60 08/15/2019 0832   Lab Results  Component Value Date   CHOL 142 06/13/2024   HDL 48 06/13/2024   LDLCALC 51 06/13/2024   LDLDIRECT 166.0 02/07/2019   TRIG 278 (H) 06/13/2024   CHOLHDL 3.0 06/13/2024   Lab Results  Component Value Date   HGBA1C 5.8 12/17/2020   Lab Results  Component Value Date   VITAMINB12 >2000 (H) 01/27/2024   Lab Results  Component Value Date   TSH 15.59 (H) 01/23/2022       ASSESSMENT AND PLAN 78 y.o. year old female  has a past medical history of BPPV (benign paroxysmal positional vertigo), Cochlear implant in place, Coronary artery calcification, DDD (degenerative disc disease), lumbosacral, Fecal incontinence, Genital herpes, GERD (gastroesophageal reflux disease), Glaucoma, both eyes, Hiatal hernia, History of adenomatous polyp of colon, History of diverticulitis of colon, History of Helicobacter pylori infection, History of kidney stones, Hyperlipidemia, mixed, Hypertension, Hypothyroidism, postsurgical, Incontinence of urine in female, Lung nodule seen on imaging study, Lymphadenopathy, Meniere's disease, OAB (overactive bladder), OSA on CPAP, Pulmonary emphysema (HCC), Sensorineural hearing loss (SNHL) of both ears, SOB (shortness of breath), Thoracic ascending aortic aneurysm, Wears glasses, and Wears partial dentures upper and lower. here with:  Mild memory disturbance  -Patient's MMSE 27 out of 30 - We discussed potentially trying memory medication however she deferred for now.  We also discussed doing biomarkers for Alzheimer's disease.  She would like to proceed with this considering her family history. - Advised if symptoms worsen or she develops new symptoms she should let us  know - Follow-up in 6 to 8 months or sooner if needed   Orders Placed This Encounter  Procedures   ATN PROFILE     Duwaine Russell, MSN, NP-C 10/25/2024, 3:47 PM [facility] [address], [address] [place], [place] [address] [phone]  The patient's condition requires frequent monitoring and adjustments in the treatment plan, reflecting the ongoing complexity of care.  This provider is the continuing focal point for all needed services for this condition.

## 2024-10-29 LAB — ATN PROFILE
A -- Beta-amyloid 42/40 Ratio: 0.097 — AB (ref >0.102)
Beta-amyloid 40: 172.34 pg/mL
Beta-amyloid 42: 16.68 pg/mL
N -- NfL, Plasma: 2.68 pg/mL (ref 0.00–6.04)
T -- p-tau181: 1.42 pg/mL — AB (ref 0.00–0.97)

## 2024-11-01 ENCOUNTER — Ambulatory Visit: Payer: Self-pay | Admitting: Adult Health

## 2024-11-01 NOTE — Progress Notes (Signed)
 We received a note from pt stating she saw the blood test online and wants us  to call her son Vana. I called him (on DPR) and discussed the message below from [facility] NP regarding ATN profile results. Pt's son verbalized understanding. He would like to do the pet scan but he wants to talk to the pt first and then call us  back before deciding and before scheduling an appointment with Dr Margaret to discuss the next steps. He says the patient has declined pretty rapidly over the last year. He has our call back # and office hours.

## 2024-11-07 NOTE — Progress Notes (Signed)
 We received a note from the patient dated 11/06/24. She states she would like to have the Pet scan asap. She is also interested in seeing a psychologist if necessary. Note has been placed on Megan NP's desk.

## 2024-11-07 NOTE — Telephone Encounter (Signed)
 Called [phone] and LVM asking for a call back to schedule appointment with us . Dr. Margaret is booking out, Duwaine has an opening on 11/30/24 at 9:30am that I have held for pt

## 2024-11-07 NOTE — Telephone Encounter (Signed)
 LVM offering sooner appt with Megan for 11/10/24 at 9am

## 2024-11-08 NOTE — Telephone Encounter (Signed)
 Pt son called , appt Scheduled

## 2024-11-10 ENCOUNTER — Ambulatory Visit: Admitting: Adult Health

## 2024-11-10 ENCOUNTER — Encounter: Payer: Self-pay | Admitting: Adult Health

## 2024-11-10 VITALS — BP 126/70 | HR 82 | Ht 60.0 in | Wt 130.4 lb

## 2024-11-10 DIAGNOSIS — Z820 Family history of epilepsy and other diseases of the nervous system: Secondary | ICD-10-CM

## 2024-11-10 DIAGNOSIS — R413 Other amnesia: Secondary | ICD-10-CM | POA: Diagnosis not present

## 2024-11-10 NOTE — Patient Instructions (Addendum)
 We could try several medications for alzheimer's disease.   ORAL meds: Aricept or Namenda   IV meds: Leqembi or Kisunla

## 2024-11-10 NOTE — Progress Notes (Signed)
 PATIENT: Rachel Gardner DOB: 1946-10-15  REASON FOR VISIT: follow up HISTORY FROM: patient   Chief Complaint  Patient presents with   RM 18     Patient is here for memory -  no change      HISTORY OF PRESENT ILLNESS: Today 11/10/24:  Rachel Gardner is a 78 y.o. female with a history of memory disturbance. Returns today for follow-up.  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  We reviewed lab work today.  She does have family history of Alzheimer's disease.  Reports that her sister has Alzheimer's.  Patient is very tearful today.  She does not want to become dependent on anyone.  Right now she is fairly active.  Able to manage her own medications appointments and finances.  She would like to proceed with PET scan.   10/25/24: Rachel Gardner is a 78 y.o. female who has been followed in this office for memory disturbance. Returns today for follow-up. SHe feels that her memory is better. She lives alone but son lives beside her. She is able to complete all ADLs independently. She manages her own medications, appointments and fiances. Reports that sister has memory issues (in [place]) she states that was stressful for her- she feels this may have precipitated some of her symptoms. This year she feels that she is better. She has neuropsychological testing in April- that showed insufficent data to support a formal memory diagnosis but she did have cognitive concerns. It was recommended.  Patient does state that she has some psychological issues from her past that she needs to deal with.  Plans to see a psychiatrist.    HISTORY 01/27/2024: She went to [place] in July and her sister has FTD. She had a fall and broke 2 ribs, stress, it hurt. One day his some told her he felt she didn't come home from [place] as well as she was she forgot recent conversations, she has left her purse behind 3 times. She has a 29/30 on MoCA. She has OSA and  is on cpap, she is compliant. Since she has come back from [place] feel more short-term memory. She lives adjacent to her son. She still drives, still goes grocery shopping, she becomes anxious driving at night so she doesn't so it. She pays her own bills, she is very organized, live independently, she cares for her dogs, not gettig lost. She goes to [employer]. She may forget some items but them remebers them no hx of alzheimers. Sister g3 with FTD. Her sister started with behavioral problems, behavioral varant FTD. She has also been stressed and worried since then. No FHx of dementia.  She can't remember things she gets up to do it and then forgets She has to leave home and write done a task of things to do but still forgets. In [place] she was raining her siblings. She has had a great memory. Denies stress now, no depression, no financial problems, she is anxious about her memory but no other anxieties, she knows there is normal cognitive aging but she has always had a great memory. Siter 3 years older no memory. Parents past away at 63 and 64 no memory problems.   REVIEW OF SYSTEMS: Out of a complete 14 system review of symptoms, the patient complains only of the following symptoms, and all other reviewed systems are negative.   Listed in HPI  ALLERGIES: Allergies  Allergen Reactions  Morphine Nausea And Vomiting   Morphine And Codeine     Nausea Can tolerate Dilaudid   Repatha  [Evolocumab ] Other (See Comments)    Body aches   Statins Other (See Comments)    Joint pain    Tape Other (See Comments)    Causes redness and will take skin when removed   Wellbutrin [Bupropion] Other (See Comments)    Suicidal intensions   Denture Adhesive Rash    HOME MEDICATIONS: Outpatient Medications Prior to Visit  Medication Sig Dispense Refill   Alirocumab  (PRALUENT ) 75 MG/ML SOAJ Inject 1 mL (75 mg total) into the skin every 14 (fourteen) days. 2 mL 11   ascorbic acid (VITAMIN C) 1000 MG tablet Take 500  mg by mouth daily.      aspirin 81 MG chewable tablet Chew 81 mg by mouth daily.      bimatoprost (LUMIGAN) 0.01 % SOLN Place 1 drop into both eyes at bedtime.     CALCIUM PO Take 1 tablet by mouth daily.     Cholecalciferol (VITAMIN D3 PO) Take 1 tablet by mouth daily.     Cyanocobalamin  (VITAMIN B-12 PO) Take 1 tablet by mouth daily.     hydrochlorothiazide  (HYDRODIURIL ) 25 MG tablet Take 1 tablet (25 mg total) by mouth daily. 90 tablet 3   hydrocortisone  (ANUSOL -HC) 2.5 % rectal cream Place 1 Application rectally 2 (two) times daily. (Patient taking differently: Place 1 Application rectally 2 (two) times daily. As needed) 30 g 1   ibuprofen  (ADVIL ) 600 MG tablet Take 1 tablet (600 mg total) by mouth every 6 (six) hours as needed. 30 tablet 0   losartan  (COZAAR ) 100 MG tablet TAKE 1 TABLET(100 MG) BY MOUTH DAILY 90 tablet 3   METAMUCIL FIBER PO Take 1 Dose by mouth 2 (two) times daily.     omega-3 acid ethyl esters (LOVAZA ) 1 g capsule Take 2 capsules (2 g total) by mouth 2 (two) times daily. 360 capsule 3   Probiotic Product (PROBIOTIC ADVANCED PO) Take 1 capsule by mouth daily.     SYNTHROID  100 MCG tablet Take 100 mcg by mouth daily before breakfast.     fluticasone (FLONASE) 50 MCG/ACT nasal spray Place 2 sprays into both nostrils at bedtime. (Patient not taking: Reported on 11/10/2024)     Hydrocortisone  (PREPARATION H EX) Apply 1 Application topically 2 (two) times daily. (Patient not taking: Reported on 11/10/2024)     meclizine  (ANTIVERT ) 25 MG tablet Take 1 tablet (25 mg total) by mouth 3 (three) times daily as needed for dizziness. (Patient not taking: Reported on 11/10/2024) 90 tablet 9   No facility-administered medications prior to visit.    PAST MEDICAL HISTORY: Past Medical History:  Diagnosis Date   BPPV (benign paroxysmal positional vertigo)    neurologist--- dr ines   Cochlear implant in place    bilateral   Coronary artery calcification    cardiologist--- dr jeffrie    DDD (degenerative disc disease), lumbosacral    Fecal incontinence    intermittant , wears depends   Genital herpes    GERD (gastroesophageal reflux disease)    Glaucoma, both eyes    Hiatal hernia    History of adenomatous polyp of colon    History of diverticulitis of colon    w/ hx sugical intervention hemicolectomy in 2012   History of Helicobacter pylori infection    History of kidney stones    Hyperlipidemia, mixed    Hypertension    Hypothyroidism, postsurgical  endocrinologist-- dr kassie;   s/p total thyroidectomy for goiter, per dr kassie note benign   Incontinence of urine in female    wears depends   Lung nodule seen on imaging study    followed by dr geronimo;  per lov note chronic stable, LLL   Lymphadenopathy    followed by pcp;  evaluated by oncologist-- dr onesimo ronco note 08-22-2019 in epic   Meniere's disease    OAB (overactive bladder)    OSA on CPAP    followed by dr t. shlomo;   sleep study in epic 10-22-2018  moderate osa   Pulmonary emphysema [facility])    pulmonology--- dr christella. geronimo   Sensorineural hearing loss (SNHL) of both ears    s/p cochlear implant's   SOB (shortness of breath)    Thoracic ascending aortic aneurysm    followed by dr lucas--- 4.1 cm per 01-21-2022 chest ct   Wears glasses    Wears partial dentures upper and lower     PAST SURGICAL HISTORY: Past Surgical History:  Procedure Laterality Date   ABDOMINAL HYSTERECTOMY  1995   ANAL RECTAL MANOMETRY N/A 08/06/2016   Procedure: ANO RECTAL MANOMETRY;  Surgeon: Victory LITTIE Legrand DOUGLAS, MD;  Location: [facility];  Service: Gastroenterology;  Laterality: N/A;   CATARACT EXTRACTION W/ INTRAOCULAR LENS IMPLANT Bilateral    yrs ago   CHOLECYSTECTOMY OPEN  1989   W/ APPENDECTOMY   COCHLEAR IMPLANT Bilateral    Left/  2009;  Right / 2012   COLONOSCOPY WITH PROPOFOL   04/10/2022   by dr h. danis   CYSTOSCOPY N/A 03/06/2022   Procedure: CYSTOSCOPY With biopsy;  Surgeon: Marilynne Rosaline SAILOR, MD;  Location: [facility];  Service: Gynecology;  Laterality: N/A;  total time requested is 30 min   ESOPHAGOGASTRODUODENOSCOPY  02/03/2014   by dr d. brodie   INTERSTIM IMPLANT PLACEMENT N/A 05/19/2022   Procedure: RENNA IMPLANT FIRST STAGE;  Surgeon: Marilynne Rosaline SAILOR, MD;  Location: [facility];  Service: Gynecology;  Laterality: N/A;   INTERSTIM IMPLANT PLACEMENT N/A 06/02/2022   Procedure: RENNA IMPLANT SECOND STAGE;  Surgeon: Marilynne Rosaline SAILOR, MD;  Location: [facility];  Service: Gynecology;  Laterality: N/A;   LAPAROSCOPIC SIGMOID COLECTOMY  02/2011   in [place];    with takedown of splenic flexure; cystoscopy with bilateral ureteral stent placement   REDUCTION MAMMAPLASTY Bilateral 1989   [place]   TOTAL THYROIDECTOMY Bilateral 1995   WISDOM TOOTH EXTRACTION     yrs ago    FAMILY HISTORY: Family History  Problem Relation Age of Onset   Hypertension Mother    Hypertension Father    Hypertension Sister    Alzheimer's disease Sister    Thyroid  disease Sister    Prostate cancer Brother    Hyperlipidemia Brother    Colon cancer Maternal Aunt    Prostate cancer Paternal Uncle    Colon cancer Cousin        x 2; paternal   Colon polyps Neg Hx    Esophageal cancer Neg Hx    Stomach cancer Neg Hx    Rectal cancer Neg Hx    Breast cancer Neg Hx     SOCIAL HISTORY: Social History   Socioeconomic History   Marital status: Divorced    Spouse name: Not on file   Number of children: 3   Years of education: Not on file   Highest education level: Not on file  Occupational History  Occupation: retired engineer, civil (consulting)  Tobacco Use   Smoking status: Former    Current packs/day: 0.00    Average packs/day: 0.5 packs/day for 41.0 years (20.5 ttl pk-yrs)    Types: Cigarettes    Start date: 07/26/1980    Quit date: 07/26/2021    Years since quitting: 3.2   Smokeless tobacco: Never   Tobacco comments:    Quit 4 months ago, 1/2 pack  a day [facility] 11/01/21  Vaping Use   Vaping status: Never Used  Substance and Sexual Activity   Alcohol use: Not Currently    Comment: very rare   Drug use: Never   Sexual activity: Not on file  Other Topics Concern   Not on file  Social History Narrative   Regular exercise: walk; 1 mile a day / walk 5 miles weekend;    update 02/22/2020 not exercising   Caffeine use: 1 cup of coffee per day   3 children   Lives alone with her dogs, son lives next door   Social Drivers of Health   Financial Resource Strain: Not on file  Food Insecurity: Low Risk  (03/25/2024)   Received from [facility]   Hunger Vital Sign    Within the past 12 months, you worried that your food would run out before you got money to buy more: Never true    Within the past 12 months, the food you bought just didn't last and you didn't have money to get more. : Never true  Transportation Needs: No Transportation Needs (03/25/2024)   Received from [facility]    In the past 12 months, has lack of reliable transportation kept you from medical appointments, meetings, work or from getting things needed for daily living? : No  Physical Activity: Not on file  Stress: Not on file  Social Connections: Not on file  Intimate Partner Violence: Not on file      PHYSICAL EXAM  Vitals:   11/10/24 0843  BP: 126/70  Pulse: 82  SpO2: 98%  Weight: 130 lb 6.4 oz (59.1 kg)  Height: 5' (1.524 m)   Body mass index is 25.47 kg/m.     01/27/2024   10:03 AM 10/08/2017    2:34 PM 04/25/2015    9:38 AM  MMSE - Mini Mental State Exam  Not completed:   Unable to complete  Orientation to time 5 5    Orientation to Place 5 5    Registration 3    Attention/ Calculation 5    Recall 3    Language- name 2 objects 2    Language- repeat 1    Language- follow 3 step command 2    Language- read & follow direction 1    Write a sentence 1    Copy design 1    Total score 29       Data saved with a previous  flowsheet row definition      11/10/2024    8:54 AM 10/25/2024   10:48 AM 01/27/2024    1:16 PM  Montreal Cognitive Assessment   Visuospatial/ Executive (0/5) 4 3 2   Naming (0/3) 3 3 3   Attention: Read list of digits (0/2) 2 1 2   Attention: Read list of letters (0/1) 0 1 0  Attention: Serial 7 subtraction starting at 100 (0/3) 2 2 3   Language: Repeat phrase (0/2) 0 0 1  Language : Fluency (0/1) 0 0 1  Abstraction (0/2) 0 0 2  Delayed Recall (0/5)  1 0 5  Orientation (0/6) 2 5 6   Total 14 15 25   Adjusted Score (based on education) 14       Generalized: Well developed, in no acute distress   Neurological examination  Mentation: Alert oriented to time, place, history taking. Follows all commands speech and language fluent Cranial nerve II-XII: Pupils were equal round reactive to light. Extraocular movements were full, visual field were full on confrontational test. Facial sensation and strength were normal. Uvula tongue midline. Head turning and shoulder shrug  were normal and symmetric. Motor: The motor testing reveals 5 over 5 strength of all 4 extremities. Good symmetric motor tone is noted throughout.  Sensory: Sensory testing is intact to soft touch on all 4 extremities. No evidence of extinction is noted.  Coordination: Cerebellar testing reveals good finger-nose-finger and heel-to-shin bilaterally.  Gait and station: Gait is normal. Reflexes: Deep tendon reflexes are symmetric and normal bilaterally.   DIAGNOSTIC DATA (LABS, IMAGING, TESTING) - I reviewed patient records, labs, notes, testing and imaging myself where available.  Lab Results  Component Value Date   WBC 7.9 01/27/2024   HGB 14.7 01/27/2024   HCT 44.8 01/27/2024   MCV 89 01/27/2024   PLT 246 01/27/2024      Component Value Date/Time   NA 145 (H) 01/27/2024 1149   K 3.9 01/27/2024 1149   CL 103 01/27/2024 1149   CO2 26 01/27/2024 1149   GLUCOSE 79 01/27/2024 1149   GLUCOSE 99 06/02/2022 0637   BUN  24 01/27/2024 1149   CREATININE 0.93 01/27/2024 1149   CREATININE 0.89 08/15/2019 0832   CALCIUM 10.5 (H) 01/27/2024 1149   PROT 7.6 01/27/2024 1149   ALBUMIN 4.3 01/27/2024 1149   AST 18 01/27/2024 1149   AST 17 08/15/2019 0832   ALT 12 01/27/2024 1149   ALT 14 08/15/2019 0832   ALKPHOS 95 01/27/2024 1149   BILITOT 0.4 01/27/2024 1149   BILITOT 0.5 08/15/2019 0832   GFRNONAA >60 08/15/2019 0832   GFRAA >60 08/15/2019 0832   Lab Results  Component Value Date   CHOL 142 06/13/2024   HDL 48 06/13/2024   LDLCALC 51 06/13/2024   LDLDIRECT 166.0 02/07/2019   TRIG 278 (H) 06/13/2024   CHOLHDL 3.0 06/13/2024   Lab Results  Component Value Date   HGBA1C 5.8 12/17/2020   Lab Results  Component Value Date   VITAMINB12 >2000 (H) 01/27/2024   Lab Results  Component Value Date   TSH 15.59 (H) 01/23/2022      ASSESSMENT AND PLAN 78 y.o. year old female  has a past medical history of BPPV (benign paroxysmal positional vertigo), Cochlear implant in place, Coronary artery calcification, DDD (degenerative disc disease), lumbosacral, Fecal incontinence, Genital herpes, GERD (gastroesophageal reflux disease), Glaucoma, both eyes, Hiatal hernia, History of adenomatous polyp of colon, History of diverticulitis of colon, History of Helicobacter pylori infection, History of kidney stones, Hyperlipidemia, mixed, Hypertension, Hypothyroidism, postsurgical, Incontinence of urine in female, Lung nodule seen on imaging study, Lymphadenopathy, Meniere's disease, OAB (overactive bladder), OSA on CPAP, Pulmonary emphysema (HCC), Sensorineural hearing loss (SNHL) of both ears, SOB (shortness of breath), Thoracic ascending aortic aneurysm, Wears glasses, and Wears partial dentures upper and lower. here with:  Mild memory disturbance  - Reviewed lab work with the patient - Discussed medication options including Aricept, Namenda and the 2 IV drugs Leqembi and Kisunla. -Amyloid brain PET scan ordered -  Advised if symptoms worsen or she develops new symptoms she should let us  know -  Follow-up in 1 to 2 months or sooner if needed   Orders Placed This Encounter  Procedures   NM PET Brain Amyloid     Duwaine Russell, MSN, NP-C 11/10/2024, 4:11 PM [facility] [address] [place], [place] [address] [phone]  The patient's condition requires frequent monitoring and adjustments in the treatment plan, reflecting the ongoing complexity of care.  This provider is the continuing focal point for all needed services for this condition.

## 2024-11-10 NOTE — Addendum Note (Signed)
 Addended by: SHERRYL DUWAINE SQUIBB on: 11/10/2024 04:13 PM   Modules accepted: Orders

## 2024-11-15 ENCOUNTER — Telehealth: Payer: Self-pay | Admitting: Adult Health

## 2024-11-15 NOTE — Telephone Encounter (Signed)
 [employer] medicare shara: [id number] exp. 11/15/24-12/14/24 sent to [facility] [phone]

## 2024-11-21 DIAGNOSIS — R1011 Right upper quadrant pain: Secondary | ICD-10-CM | POA: Diagnosis not present

## 2024-11-30 ENCOUNTER — Ambulatory Visit: Admitting: Adult Health

## 2024-12-02 ENCOUNTER — Encounter (HOSPITAL_COMMUNITY)
Admission: RE | Admit: 2024-12-02 | Discharge: 2024-12-02 | Disposition: A | Source: Ambulatory Visit | Attending: Adult Health

## 2024-12-02 DIAGNOSIS — R413 Other amnesia: Secondary | ICD-10-CM

## 2024-12-02 DIAGNOSIS — Z820 Family history of epilepsy and other diseases of the nervous system: Secondary | ICD-10-CM

## 2024-12-02 MED ORDER — FLORBETAPIR F 18 500-1900 MBQ/ML IV SOLN
10.4300 | Freq: Once | INTRAVENOUS | Status: AC
  Administered 2024-12-02: 10.43 via INTRAVENOUS

## 2024-12-05 ENCOUNTER — Ambulatory Visit: Payer: Self-pay | Admitting: Adult Health

## 2024-12-06 NOTE — Telephone Encounter (Signed)
 Spoke to son (checked DPR) Gave PET scan results Son states was expecting this outcome will see Dr margaret in Jan 2026 to discuss medications for patient

## 2024-12-06 NOTE — Telephone Encounter (Signed)
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 Keep appt in January to discuss medication options with Dr. Margaret ----- Message ----- From: Interface, Rad Results In Sent: 12/05/2024  10:28 AM EST To: Duwaine Russell, NP

## 2025-01-05 ENCOUNTER — Other Ambulatory Visit: Payer: Self-pay | Admitting: Surgery

## 2025-01-05 DIAGNOSIS — I7121 Aneurysm of the ascending aorta, without rupture: Secondary | ICD-10-CM

## 2025-01-09 ENCOUNTER — Encounter: Payer: Self-pay | Admitting: *Deleted

## 2025-01-12 ENCOUNTER — Encounter: Payer: Self-pay | Admitting: Diagnostic Neuroimaging

## 2025-01-12 ENCOUNTER — Ambulatory Visit: Admitting: Diagnostic Neuroimaging

## 2025-01-12 VITALS — BP 120/76 | HR 80 | Ht 60.0 in | Wt 131.8 lb

## 2025-01-12 DIAGNOSIS — G3184 Mild cognitive impairment, so stated: Secondary | ICD-10-CM

## 2025-01-12 DIAGNOSIS — G309 Alzheimer's disease, unspecified: Secondary | ICD-10-CM

## 2025-01-12 NOTE — Progress Notes (Signed)
 "  [facility]  PATIENT: Rachel Gardner DOB: April 06, 1946  REFERRING CLINICIAN: Dwight Trula SHAUNNA, MD HISTORY FROM: patient  REASON FOR VISIT: follow up   HISTORICAL  CHIEF COMPLAINT:  Chief Complaint  Patient presents with   RM 7     Patient is here with son for memory follow-up - moca was done 10/2024 14/30    HISTORY OF PRESENT ILLNESS:   79 year old female here for evaluation of memory loss.  Symptoms started around 2024 with mild short-term memory loss and forgetfulness.  No major changes of ADLs.  She had extensive workup already with abnormal amyloid PET scan and ATN panel indicating Alzheimer's pathology.  She lives on her own but next-door to her son.  She has good family support.  She is trying to improve her diet, activity and sleep.   REVIEW OF SYSTEMS: Full 14 system review of systems performed and negative with exception of: as per HPI.  ALLERGIES: Allergies[1]  HOME MEDICATIONS: Outpatient Medications Prior to Visit  Medication Sig Dispense Refill   Alirocumab  (PRALUENT ) 75 MG/ML SOAJ Inject 1 mL (75 mg total) into the skin every 14 (fourteen) days. 2 mL 11   ascorbic acid (VITAMIN C) 1000 MG tablet Take 500 mg by mouth daily.      aspirin 81 MG chewable tablet Chew 81 mg by mouth daily.      bimatoprost (LUMIGAN) 0.01 % SOLN Place 1 drop into both eyes at bedtime.     CALCIUM PO Take 1 tablet by mouth daily.     Cholecalciferol (VITAMIN D3 PO) Take 1 tablet by mouth daily.     Cyanocobalamin  (VITAMIN B-12 PO) Take 1 tablet by mouth daily.     hydrochlorothiazide  (HYDRODIURIL ) 25 MG tablet Take 1 tablet (25 mg total) by mouth daily. 90 tablet 3   hydrocortisone  (ANUSOL -HC) 2.5 % rectal cream Place 1 Application rectally 2 (two) times daily. (Patient taking differently: Place 1 Application rectally 2 (two) times daily. As needed) 30 g 1   Hydrocortisone  (PREPARATION H EX) Apply 1 Application topically 2 (two) times daily.     ibuprofen  (ADVIL ) 600  MG tablet Take 1 tablet (600 mg total) by mouth every 6 (six) hours as needed. 30 tablet 0   losartan  (COZAAR ) 100 MG tablet TAKE 1 TABLET(100 MG) BY MOUTH DAILY 90 tablet 3   meclizine  (ANTIVERT ) 25 MG tablet Take 1 tablet (25 mg total) by mouth 3 (three) times daily as needed for dizziness. 90 tablet 9   METAMUCIL FIBER PO Take 1 Dose by mouth 2 (two) times daily.     omega-3 acid ethyl esters (LOVAZA ) 1 g capsule Take 2 capsules (2 g total) by mouth 2 (two) times daily. 360 capsule 3   Probiotic Product (PROBIOTIC ADVANCED PO) Take 1 capsule by mouth daily.     SYNTHROID  100 MCG tablet Take 100 mcg by mouth daily before breakfast.     fluticasone (FLONASE) 50 MCG/ACT nasal spray Place 2 sprays into both nostrils at bedtime. (Patient not taking: Reported on 11/10/2024)     No facility-administered medications prior to visit.    PAST MEDICAL HISTORY: Past Medical History:  Diagnosis Date   BPPV (benign paroxysmal positional vertigo)    neurologist--- dr ines   Cochlear implant in place    bilateral   Coronary artery calcification    cardiologist--- dr jeffrie   DDD (degenerative disc disease), lumbosacral    Fecal incontinence    intermittant , wears depends   Genital  herpes    GERD (gastroesophageal reflux disease)    Glaucoma, both eyes    Hiatal hernia    History of adenomatous polyp of colon    History of diverticulitis of colon    w/ hx sugical intervention hemicolectomy in 2012   History of Helicobacter pylori infection    History of kidney stones    Hyperlipidemia, mixed    Hypertension    Hypothyroidism, postsurgical    endocrinologist-- dr kassie;   s/p total thyroidectomy for goiter, per dr kassie note benign   Incontinence of urine in female    wears depends   Lung nodule seen on imaging study    followed by dr geronimo;  per lov note chronic stable, LLL   Lymphadenopathy    followed by pcp;  evaluated by oncologist-- dr onesimo ronco note 08-22-2019 in epic    Meniere's disease    OAB (overactive bladder)    OSA on CPAP    followed by dr t. shlomo;   sleep study in epic 10-22-2018  moderate osa   Pulmonary emphysema [facility])    pulmonology--- dr christella. geronimo   Sensorineural hearing loss (SNHL) of both ears    s/p cochlear implant's   SOB (shortness of breath)    Thoracic ascending aortic aneurysm    followed by dr lucas--- 4.1 cm per 01-21-2022 chest ct   Wears glasses    Wears partial dentures upper and lower     PAST SURGICAL HISTORY: Past Surgical History:  Procedure Laterality Date   ABDOMINAL HYSTERECTOMY  1995   ANAL RECTAL MANOMETRY N/A 08/06/2016   Procedure: ANO RECTAL MANOMETRY;  Surgeon: Victory LITTIE Legrand DOUGLAS, MD;  Location: [facility];  Service: Gastroenterology;  Laterality: N/A;   CATARACT EXTRACTION W/ INTRAOCULAR LENS IMPLANT Bilateral    yrs ago   CHOLECYSTECTOMY OPEN  1989   W/ APPENDECTOMY   COCHLEAR IMPLANT Bilateral    Left/  2009;  Right / 2012   COLONOSCOPY WITH PROPOFOL   04/10/2022   by dr h. danis   CYSTOSCOPY N/A 03/06/2022   Procedure: CYSTOSCOPY With biopsy;  Surgeon: Marilynne Rosaline SAILOR, MD;  Location: [facility];  Service: Gynecology;  Laterality: N/A;  total time requested is 30 min   ESOPHAGOGASTRODUODENOSCOPY  02/03/2014   by dr d. brodie   INTERSTIM IMPLANT PLACEMENT N/A 05/19/2022   Procedure: RENNA IMPLANT FIRST STAGE;  Surgeon: Marilynne Rosaline SAILOR, MD;  Location: [facility];  Service: Gynecology;  Laterality: N/A;   INTERSTIM IMPLANT PLACEMENT N/A 06/02/2022   Procedure: RENNA IMPLANT SECOND STAGE;  Surgeon: Marilynne Rosaline SAILOR, MD;  Location: [facility];  Service: Gynecology;  Laterality: N/A;   LAPAROSCOPIC SIGMOID COLECTOMY  02/2011   in [place];    with takedown of splenic flexure; cystoscopy with bilateral ureteral stent placement   REDUCTION MAMMAPLASTY Bilateral 1989   [place]   TOTAL THYROIDECTOMY Bilateral 1995   WISDOM TOOTH EXTRACTION      yrs ago    FAMILY HISTORY: Family History  Problem Relation Age of Onset   Hypertension Mother    Hypertension Father    Hypertension Sister    Alzheimer's disease Sister    Thyroid  disease Sister    Prostate cancer Brother    Hyperlipidemia Brother    Colon cancer Maternal Aunt    Prostate cancer Paternal Uncle    Colon cancer Cousin        x 2; paternal   Colon polyps Neg Hx  Esophageal cancer Neg Hx    Stomach cancer Neg Hx    Rectal cancer Neg Hx    Breast cancer Neg Hx     SOCIAL HISTORY: Social History   Socioeconomic History   Marital status: Divorced    Spouse name: Not on file   Number of children: 3   Years of education: Not on file   Highest education level: Not on file  Occupational History   Occupation: retired engineer, civil (consulting)  Tobacco Use   Smoking status: Former    Current packs/day: 0.00    Average packs/day: 0.5 packs/day for 41.0 years (20.5 ttl pk-yrs)    Types: Cigarettes    Start date: 07/26/1980    Quit date: 07/26/2021    Years since quitting: 3.4   Smokeless tobacco: Never   Tobacco comments:    Quit 4 months ago, 1/2 pack a day [facility] 11/01/21  Vaping Use   Vaping status: Never Used  Substance and Sexual Activity   Alcohol use: Not Currently    Comment: very rare   Drug use: Never   Sexual activity: Not on file  Other Topics Concern   Not on file  Social History Narrative   Regular exercise: walk; 1 mile a day / walk 5 miles weekend;    update 02/22/2020 not exercising   Caffeine use: 1 cup of coffee per day   3 children   Lives alone with her dogs, son lives next door   Social Drivers of Health   Tobacco Use: Medium Risk (01/12/2025)   Patient History    Smoking Tobacco Use: Former    Smokeless Tobacco Use: Never    Passive Exposure: Not on Actuary Strain: Not on file  Food Insecurity: Low Risk (03/25/2024)   Received from [facility]   Epic    Within the past 12 months, you worried that your food would run out  before you got money to buy more: Never true    Within the past 12 months, the food you bought just didn't last and you didn't have money to get more. : Never true  Transportation Needs: No Transportation Needs (03/25/2024)   Received from [facility]    In the past 12 months, has lack of reliable transportation kept you from medical appointments, meetings, work or from getting things needed for daily living? : No  Physical Activity: Not on file  Stress: Not on file  Social Connections: Not on file  Intimate Partner Violence: Not on file  Depression (EYV7-0): Not on file  Alcohol Screen: Not on file  Housing: Low Risk (03/25/2024)   Received from [facility]   Epic    What is your living situation today?: I have a steady place to live    Think about the place you live. Do you have problems with any of the following? Choose all that apply:: None/None on this list  Utilities: Low Risk (03/25/2024)   Received from [facility]   Utilities    In the past 12 months has the electric, gas, oil, or water  company threatened to shut off services in your home? : No  Health Literacy: Not on file     PHYSICAL EXAM  GENERAL EXAM/CONSTITUTIONAL: Vitals:  Vitals:   01/12/25 1400  BP: 120/76  Pulse: 80  Weight: 131 lb 12.8 oz (59.8 kg)  Height: 5' (1.524 m)   Body mass index is 25.74 kg/m. Wt Readings from Last 3 Encounters:  01/12/25 131 lb  12.8 oz (59.8 kg)  11/10/24 130 lb 6.4 oz (59.1 kg)  10/25/24 131 lb (59.4 kg)   Patient is in no distress; well developed, nourished and groomed; neck is supple  CARDIOVASCULAR: Examination of carotid arteries is normal; no carotid bruits Regular rate and rhythm, no murmurs Examination of peripheral vascular system by observation and palpation is normal  EYES: Ophthalmoscopic exam of optic discs and posterior segments is normal; no papilledema or hemorrhages No results found.  MUSCULOSKELETAL: Gait, strength, tone,  movements noted in Neurologic exam below  NEUROLOGIC: MENTAL STATUS:     01/27/2024   10:03 AM 10/08/2017    2:34 PM 04/25/2015    9:38 AM  MMSE - Mini Mental State Exam  Not completed:   Unable to complete  Orientation to time 5 5    Orientation to Place 5 5    Registration 3    Attention/ Calculation 5    Recall 3    Language- name 2 objects 2    Language- repeat 1    Language- follow 3 step command 2    Language- read & follow direction 1    Write a sentence 1    Copy design 1    Total score 29       Data saved with a previous flowsheet row definition      11/10/2024    8:54 AM 10/25/2024   10:48 AM 01/27/2024    1:16 PM  Montreal Cognitive Assessment   Visuospatial/ Executive (0/5) 4 3 2   Naming (0/3) 3 3 3   Attention: Read list of digits (0/2) 2 1 2   Attention: Read list of letters (0/1) 0 1 0  Attention: Serial 7 subtraction starting at 100 (0/3) 2 2 3   Language: Repeat phrase (0/2) 0 0 1  Language : Fluency (0/1) 0 0 1  Abstraction (0/2) 0 0 2  Delayed Recall (0/5) 1 0 5  Orientation (0/6) 2 5 6   Total 14 15 25   Adjusted Score (based on education) 14     awake, alert, oriented to person, place and time DECR RECALL DECR attention language fluent, comprehension intact, naming intact fund of knowledge appropriate  CRANIAL NERVE:  2nd, 3rd, 4th, 6th - pupils equal and reactive to light, visual fields full to confrontation, extraocular muscles intact, no nystagmus 5th - facial sensation symmetric 7th - facial strength symmetric 8th - hearing intact 9th - palate elevates symmetrically, uvula midline 11th - shoulder shrug symmetric 12th - tongue protrusion midline  MOTOR:  normal bulk and tone, full strength in the BUE, BLE  SENSORY:  normal and symmetric to light touch, temperature, vibration  COORDINATION:  finger-nose-finger, fine finger movements normal  REFLEXES:  deep tendon reflexes 1+ and symmetric  GAIT/STATION:  narrow based  gait     DIAGNOSTIC DATA (LABS, IMAGING, TESTING) - I reviewed patient records, labs, notes, testing and imaging myself where available.  Lab Results  Component Value Date   WBC 7.9 01/27/2024   HGB 14.7 01/27/2024   HCT 44.8 01/27/2024   MCV 89 01/27/2024   PLT 246 01/27/2024      Component Value Date/Time   NA 145 (H) 01/27/2024 1149   K 3.9 01/27/2024 1149   CL 103 01/27/2024 1149   CO2 26 01/27/2024 1149   GLUCOSE 79 01/27/2024 1149   GLUCOSE 99 06/02/2022 0637   BUN 24 01/27/2024 1149   CREATININE 0.93 01/27/2024 1149   CREATININE 0.89 08/15/2019 0832   CALCIUM 10.5 (H) 01/27/2024 1149  PROT 7.6 01/27/2024 1149   ALBUMIN 4.3 01/27/2024 1149   AST 18 01/27/2024 1149   AST 17 08/15/2019 0832   ALT 12 01/27/2024 1149   ALT 14 08/15/2019 0832   ALKPHOS 95 01/27/2024 1149   BILITOT 0.4 01/27/2024 1149   BILITOT 0.5 08/15/2019 0832   GFRNONAA >60 08/15/2019 0832   GFRAA >60 08/15/2019 0832   Lab Results  Component Value Date   CHOL 142 06/13/2024   HDL 48 06/13/2024   LDLCALC 51 06/13/2024   LDLDIRECT 166.0 02/07/2019   TRIG 278 (H) 06/13/2024   CHOLHDL 3.0 06/13/2024   Lab Results  Component Value Date   HGBA1C 5.8 12/17/2020   Lab Results  Component Value Date   VITAMINB12 >2000 (H) 01/27/2024   Lab Results  Component Value Date   TSH 15.59 (H) 01/23/2022   10/25/24  A -- Beta-amyloid 42/40 Ratio 0.097 Low   Beta-amyloid 42 16.68  Beta-amyloid 40 172.34  T -- p-tau181 1.42 High   N -- NfL, Plasma 2.68  ATN SUMMARY Comment  Comment:                        A+ T+ N- A low beta-amyloid 42/40 and a high pTau181 concentration were observed. A normal NfL concentration was observed at this time. These results are consistent with the presence of Alzheimer's related pathology.    ASSESSMENT AND PLAN  79 y.o. year old female here with:   Dx:  1. 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 - aim for at least 7-8 hours sleep per night (or more) - avoid smoking and alcohol - caution with medications, finances, driving - safety / supervision issues reviewed - caregiver resources provided (including [email or web address])  Return for return to PCP, pending if symptoms worsen or fail to improve.    EDUARD FABIENE HANLON, MD 01/12/2025, 2:32 PM Certified in Neurology, Neurophysiology and Neuroimaging  [facility] [address], [address] [place], [place] [address] [phone]     [1]  Allergies Allergen Reactions   Morphine Nausea And Vomiting   Morphine And Codeine     Nausea Can tolerate Dilaudid   Repatha  [Evolocumab ] Other (See Comments)    Body aches   Statins Other (See Comments)    Joint pain    Tape Other (See Comments)    Causes redness and will take skin when removed   Wellbutrin [Bupropion] Other (See Comments)    Suicidal intensions   Denture Adhesive Rash   "

## 2025-01-12 NOTE — Patient Instructions (Signed)
" °  MCI (due to alzheimer's disease) - consider memantine 10mg  at bedtime; increase to twice a day after 1-2 weeks - try to stay active physically and get some exercise (at least 15-30 minutes per day) - eat a nutritious diet with lean protein, plants / vegetables, whole grains; avoid ultra-processed foods - increase social activities, brain stimulation, games, puzzles, hobbies, crafts, arts, music; try new activities; keep it fun! - aim for at least 7-8 hours sleep per night (or more) - avoid smoking and alcohol - caution with medications, finances, driving - safety / supervision issues reviewed - caregiver resources provided (including [email or web address]) "

## 2025-02-01 ENCOUNTER — Ambulatory Visit (HOSPITAL_COMMUNITY)
Admission: RE | Admit: 2025-02-01 | Discharge: 2025-02-01 | Disposition: A | Source: Ambulatory Visit | Attending: Surgery | Admitting: Surgery

## 2025-02-01 DIAGNOSIS — I7121 Aneurysm of the ascending aorta, without rupture: Secondary | ICD-10-CM

## 2025-02-01 MED ORDER — IOHEXOL 350 MG/ML SOLN
75.0000 mL | Freq: Once | INTRAVENOUS | Status: AC | PRN
  Administered 2025-02-01: 75 mL via INTRAVENOUS

## 2025-02-08 ENCOUNTER — Ambulatory Visit (HOSPITAL_COMMUNITY)

## 2025-02-09 ENCOUNTER — Ambulatory Visit: Admitting: Obstetrics and Gynecology

## 2025-02-22 ENCOUNTER — Ambulatory Visit
